# Patient Record
Sex: Female | Born: 1945
Health system: Southern US, Community
[De-identification: ages and names within clinical notes are randomized; demographics above are authoritative.]

## PROBLEM LIST (undated history)

## (undated) DIAGNOSIS — Z5189 Encounter for other specified aftercare: Secondary | ICD-10-CM

## (undated) DIAGNOSIS — I10 Essential (primary) hypertension: Secondary | ICD-10-CM

## (undated) DIAGNOSIS — N184 Chronic kidney disease, stage 4 (severe): Secondary | ICD-10-CM

## (undated) DIAGNOSIS — K219 Gastro-esophageal reflux disease without esophagitis: Secondary | ICD-10-CM

## (undated) DIAGNOSIS — M1990 Unspecified osteoarthritis, unspecified site: Secondary | ICD-10-CM

## (undated) DIAGNOSIS — IMO0001 Reserved for inherently not codable concepts without codable children: Secondary | ICD-10-CM

## (undated) DIAGNOSIS — I509 Heart failure, unspecified: Secondary | ICD-10-CM

## (undated) DIAGNOSIS — G4730 Sleep apnea, unspecified: Secondary | ICD-10-CM

## (undated) DIAGNOSIS — R0602 Shortness of breath: Secondary | ICD-10-CM

## (undated) DIAGNOSIS — I2510 Atherosclerotic heart disease of native coronary artery without angina pectoris: Secondary | ICD-10-CM

## (undated) HISTORY — PX: TOTAL KNEE ARTHROPLASTY: SHX125

## (undated) HISTORY — PX: CORONARY ANGIOPLASTY WITH STENT PLACEMENT: SHX49

## (undated) HISTORY — PX: CHOLECYSTECTOMY: SHX55

---

## 2002-09-19 ENCOUNTER — Encounter: Payer: Self-pay | Admitting: Emergency Medicine

## 2002-09-19 ENCOUNTER — Inpatient Hospital Stay (HOSPITAL_COMMUNITY): Admission: AD | Admit: 2002-09-19 | Discharge: 2002-09-25 | Payer: Self-pay | Admitting: Cardiology

## 2002-09-20 ENCOUNTER — Encounter: Payer: Self-pay | Admitting: Cardiology

## 2002-09-24 ENCOUNTER — Encounter: Payer: Self-pay | Admitting: Cardiology

## 2002-12-22 ENCOUNTER — Encounter: Payer: Self-pay | Admitting: Internal Medicine

## 2002-12-22 ENCOUNTER — Ambulatory Visit (HOSPITAL_COMMUNITY): Admission: RE | Admit: 2002-12-22 | Discharge: 2002-12-22 | Payer: Self-pay | Admitting: Internal Medicine

## 2003-02-18 ENCOUNTER — Emergency Department (HOSPITAL_COMMUNITY): Admission: EM | Admit: 2003-02-18 | Discharge: 2003-02-18 | Payer: Self-pay | Admitting: Emergency Medicine

## 2003-02-18 ENCOUNTER — Encounter: Payer: Self-pay | Admitting: Emergency Medicine

## 2003-03-05 ENCOUNTER — Encounter: Payer: Self-pay | Admitting: Orthopedic Surgery

## 2003-03-05 ENCOUNTER — Ambulatory Visit (HOSPITAL_COMMUNITY): Admission: RE | Admit: 2003-03-05 | Discharge: 2003-03-05 | Payer: Self-pay | Admitting: Orthopedic Surgery

## 2003-08-05 ENCOUNTER — Ambulatory Visit (HOSPITAL_COMMUNITY): Admission: RE | Admit: 2003-08-05 | Discharge: 2003-08-05 | Payer: Self-pay | Admitting: Family Medicine

## 2003-11-17 ENCOUNTER — Emergency Department (HOSPITAL_COMMUNITY): Admission: EM | Admit: 2003-11-17 | Discharge: 2003-11-17 | Payer: Self-pay | Admitting: Emergency Medicine

## 2004-04-28 ENCOUNTER — Ambulatory Visit (HOSPITAL_COMMUNITY): Admission: RE | Admit: 2004-04-28 | Discharge: 2004-04-28 | Payer: Self-pay | Admitting: Family Medicine

## 2004-07-28 ENCOUNTER — Inpatient Hospital Stay (HOSPITAL_COMMUNITY): Admission: EM | Admit: 2004-07-28 | Discharge: 2004-07-29 | Payer: Self-pay | Admitting: Emergency Medicine

## 2004-07-28 ENCOUNTER — Encounter (INDEPENDENT_AMBULATORY_CARE_PROVIDER_SITE_OTHER): Payer: Self-pay | Admitting: *Deleted

## 2004-08-20 ENCOUNTER — Ambulatory Visit (HOSPITAL_COMMUNITY): Admission: RE | Admit: 2004-08-20 | Discharge: 2004-08-20 | Payer: Self-pay | Admitting: Family Medicine

## 2004-08-28 ENCOUNTER — Encounter: Payer: Self-pay | Admitting: Cardiology

## 2004-08-28 ENCOUNTER — Ambulatory Visit (HOSPITAL_COMMUNITY): Admission: RE | Admit: 2004-08-28 | Discharge: 2004-08-28 | Payer: Self-pay | Admitting: Internal Medicine

## 2004-09-08 ENCOUNTER — Ambulatory Visit: Payer: Self-pay | Admitting: Nurse Practitioner

## 2004-09-18 ENCOUNTER — Ambulatory Visit: Payer: Self-pay | Admitting: *Deleted

## 2004-11-27 ENCOUNTER — Ambulatory Visit: Payer: Self-pay | Admitting: Cardiology

## 2005-01-05 ENCOUNTER — Emergency Department (HOSPITAL_COMMUNITY): Admission: EM | Admit: 2005-01-05 | Discharge: 2005-01-05 | Payer: Self-pay | Admitting: *Deleted

## 2005-01-05 ENCOUNTER — Ambulatory Visit: Payer: Self-pay | Admitting: Cardiology

## 2005-01-08 ENCOUNTER — Ambulatory Visit: Payer: Self-pay | Admitting: Family Medicine

## 2005-01-11 ENCOUNTER — Ambulatory Visit (HOSPITAL_COMMUNITY): Admission: RE | Admit: 2005-01-11 | Discharge: 2005-01-11 | Payer: Self-pay | Admitting: Internal Medicine

## 2005-01-12 ENCOUNTER — Ambulatory Visit (HOSPITAL_COMMUNITY): Admission: RE | Admit: 2005-01-12 | Discharge: 2005-01-12 | Payer: Self-pay | Admitting: Internal Medicine

## 2005-01-12 ENCOUNTER — Ambulatory Visit: Payer: Self-pay | Admitting: Internal Medicine

## 2005-01-13 ENCOUNTER — Ambulatory Visit: Payer: Self-pay

## 2005-01-15 ENCOUNTER — Emergency Department (HOSPITAL_COMMUNITY): Admission: EM | Admit: 2005-01-15 | Discharge: 2005-01-15 | Payer: Self-pay | Admitting: Emergency Medicine

## 2005-01-18 ENCOUNTER — Ambulatory Visit: Payer: Self-pay

## 2005-01-25 ENCOUNTER — Ambulatory Visit (HOSPITAL_COMMUNITY): Admission: RE | Admit: 2005-01-25 | Discharge: 2005-01-25 | Payer: Self-pay | Admitting: Internal Medicine

## 2005-02-15 ENCOUNTER — Ambulatory Visit: Payer: Self-pay | Admitting: Internal Medicine

## 2005-02-19 ENCOUNTER — Ambulatory Visit: Payer: Self-pay | Admitting: Nurse Practitioner

## 2005-03-04 ENCOUNTER — Encounter (INDEPENDENT_AMBULATORY_CARE_PROVIDER_SITE_OTHER): Payer: Self-pay | Admitting: *Deleted

## 2005-03-04 ENCOUNTER — Observation Stay (HOSPITAL_COMMUNITY): Admission: RE | Admit: 2005-03-04 | Discharge: 2005-03-05 | Payer: Self-pay | Admitting: General Surgery

## 2005-03-13 ENCOUNTER — Emergency Department (HOSPITAL_COMMUNITY): Admission: EM | Admit: 2005-03-13 | Discharge: 2005-03-13 | Payer: Self-pay | Admitting: Emergency Medicine

## 2005-04-05 ENCOUNTER — Ambulatory Visit: Payer: Self-pay | Admitting: Cardiology

## 2005-09-16 ENCOUNTER — Ambulatory Visit (HOSPITAL_COMMUNITY): Admission: RE | Admit: 2005-09-16 | Discharge: 2005-09-16 | Payer: Self-pay | Admitting: Family Medicine

## 2005-09-23 ENCOUNTER — Ambulatory Visit: Payer: Self-pay | Admitting: Nurse Practitioner

## 2006-03-23 ENCOUNTER — Ambulatory Visit: Payer: Self-pay | Admitting: Nurse Practitioner

## 2006-04-14 ENCOUNTER — Ambulatory Visit: Payer: Self-pay | Admitting: Cardiology

## 2006-04-26 ENCOUNTER — Ambulatory Visit: Payer: Self-pay

## 2006-05-24 ENCOUNTER — Ambulatory Visit: Payer: Self-pay | Admitting: Cardiology

## 2006-05-24 ENCOUNTER — Inpatient Hospital Stay (HOSPITAL_COMMUNITY): Admission: RE | Admit: 2006-05-24 | Discharge: 2006-05-31 | Payer: Self-pay | Admitting: Orthopaedic Surgery

## 2006-05-25 ENCOUNTER — Ambulatory Visit: Payer: Self-pay | Admitting: Physical Medicine & Rehabilitation

## 2006-07-28 ENCOUNTER — Encounter: Admission: RE | Admit: 2006-07-28 | Discharge: 2006-08-22 | Payer: Self-pay | Admitting: Orthopaedic Surgery

## 2007-02-23 ENCOUNTER — Ambulatory Visit: Payer: Self-pay | Admitting: Nurse Practitioner

## 2007-03-08 ENCOUNTER — Ambulatory Visit (HOSPITAL_COMMUNITY): Admission: RE | Admit: 2007-03-08 | Discharge: 2007-03-08 | Payer: Self-pay | Admitting: Family Medicine

## 2007-06-20 ENCOUNTER — Ambulatory Visit: Payer: Self-pay | Admitting: Cardiology

## 2007-07-06 ENCOUNTER — Ambulatory Visit: Payer: Self-pay | Admitting: Family Medicine

## 2007-07-24 ENCOUNTER — Encounter (INDEPENDENT_AMBULATORY_CARE_PROVIDER_SITE_OTHER): Payer: Self-pay | Admitting: Nurse Practitioner

## 2007-07-24 ENCOUNTER — Ambulatory Visit: Payer: Self-pay | Admitting: Internal Medicine

## 2007-07-24 LAB — CONVERTED CEMR LAB
HDL: 50 mg/dL (ref >39)
Total CHOL/HDL Ratio: 3.9
VLDL: 14 mg/dL (ref 0–40)

## 2007-10-01 ENCOUNTER — Emergency Department (HOSPITAL_COMMUNITY): Admission: EM | Admit: 2007-10-01 | Discharge: 2007-10-01 | Payer: Self-pay | Admitting: Emergency Medicine

## 2007-10-04 ENCOUNTER — Emergency Department (HOSPITAL_COMMUNITY): Admission: EM | Admit: 2007-10-04 | Discharge: 2007-10-05 | Payer: Self-pay | Admitting: Family Medicine

## 2007-10-09 ENCOUNTER — Ambulatory Visit: Payer: Self-pay | Admitting: Nurse Practitioner

## 2007-10-09 LAB — CONVERTED CEMR LAB
ALT: 18 units/L (ref 0–35)
Alkaline Phosphatase: 88 units/L (ref 39–117)
Bilirubin, Direct: 0.1 mg/dL (ref 0.0–0.3)
Cholesterol: 171 mg/dL (ref 0–200)
Indirect Bilirubin: 0.4 mg/dL (ref 0.0–0.9)
LDL Cholesterol: 106 mg/dL — ABNORMAL HIGH (ref 0–99)
Triglycerides: 69 mg/dL (ref <150)
VLDL: 14 mg/dL (ref 0–40)

## 2007-12-01 ENCOUNTER — Ambulatory Visit: Payer: Self-pay | Admitting: Family Medicine

## 2008-01-04 ENCOUNTER — Encounter (INDEPENDENT_AMBULATORY_CARE_PROVIDER_SITE_OTHER): Payer: Self-pay | Admitting: Nurse Practitioner

## 2008-01-04 ENCOUNTER — Ambulatory Visit: Payer: Self-pay | Admitting: Internal Medicine

## 2008-01-04 LAB — CONVERTED CEMR LAB
AST: 15 units/L (ref 0–37)
Alkaline Phosphatase: 84 units/L (ref 39–117)
BUN: 21 mg/dL (ref 6–23)
Basophils Absolute: 0 10*3/uL (ref 0.0–0.1)
Basophils Relative: 0 % (ref 0–1)
Creatinine, Ser: 1.19 mg/dL (ref 0.40–1.20)
Eosinophils Absolute: 0.2 10*3/uL (ref 0.0–0.7)
HDL: 57 mg/dL (ref >39)
Hemoglobin: 13.8 g/dL (ref 12.0–15.0)
LDL Cholesterol: 103 mg/dL — ABNORMAL HIGH (ref 0–99)
MCHC: 31.9 g/dL (ref 30.0–36.0)
MCV: 88.9 fL (ref 78.0–100.0)
Monocytes Absolute: 0.5 10*3/uL (ref 0.1–1.0)
Monocytes Relative: 5 % (ref 3–12)
RBC: 4.86 M/uL (ref 3.87–5.11)
RDW: 15.7 % — ABNORMAL HIGH (ref 11.5–15.5)
TSH: 1.927 microintl units/mL (ref 0.350–5.50)
Total Bilirubin: 0.4 mg/dL (ref 0.3–1.2)
Total CHOL/HDL Ratio: 3
VLDL: 13 mg/dL (ref 0–40)

## 2008-01-17 ENCOUNTER — Ambulatory Visit: Payer: Self-pay | Admitting: Cardiology

## 2008-07-08 ENCOUNTER — Ambulatory Visit: Payer: Self-pay | Admitting: Family Medicine

## 2008-07-08 ENCOUNTER — Encounter (INDEPENDENT_AMBULATORY_CARE_PROVIDER_SITE_OTHER): Payer: Self-pay | Admitting: Family Medicine

## 2008-07-08 LAB — CONVERTED CEMR LAB
ALT: 20 units/L (ref 0–35)
AST: 18 units/L (ref 0–37)
Alkaline Phosphatase: 88 units/L (ref 39–117)
CO2: 25 meq/L (ref 19–32)
Cholesterol: 190 mg/dL (ref 0–200)
Creatinine, Ser: 1.23 mg/dL — ABNORMAL HIGH (ref 0.40–1.20)
Total Bilirubin: 0.5 mg/dL (ref 0.3–1.2)
Total CHOL/HDL Ratio: 3.5
VLDL: 14 mg/dL (ref 0–40)

## 2008-09-11 ENCOUNTER — Ambulatory Visit: Payer: Self-pay | Admitting: Cardiology

## 2008-09-17 DIAGNOSIS — I2510 Atherosclerotic heart disease of native coronary artery without angina pectoris: Secondary | ICD-10-CM | POA: Insufficient documentation

## 2008-09-17 DIAGNOSIS — Z9089 Acquired absence of other organs: Secondary | ICD-10-CM

## 2008-09-17 DIAGNOSIS — E785 Hyperlipidemia, unspecified: Secondary | ICD-10-CM

## 2008-09-17 DIAGNOSIS — E669 Obesity, unspecified: Secondary | ICD-10-CM

## 2008-09-17 DIAGNOSIS — I10 Essential (primary) hypertension: Secondary | ICD-10-CM | POA: Insufficient documentation

## 2008-09-27 ENCOUNTER — Inpatient Hospital Stay (HOSPITAL_COMMUNITY): Admission: RE | Admit: 2008-09-27 | Discharge: 2008-10-01 | Payer: Self-pay | Admitting: Orthopaedic Surgery

## 2008-10-07 ENCOUNTER — Emergency Department (HOSPITAL_COMMUNITY): Admission: EM | Admit: 2008-10-07 | Discharge: 2008-10-07 | Payer: Self-pay | Admitting: Emergency Medicine

## 2008-10-09 ENCOUNTER — Ambulatory Visit: Payer: Self-pay | Admitting: Family Medicine

## 2008-10-10 ENCOUNTER — Emergency Department (HOSPITAL_COMMUNITY): Admission: EM | Admit: 2008-10-10 | Discharge: 2008-10-10 | Payer: Self-pay | Admitting: Emergency Medicine

## 2008-10-11 ENCOUNTER — Other Ambulatory Visit: Admission: RE | Admit: 2008-10-11 | Discharge: 2008-10-11 | Payer: Self-pay | Admitting: Obstetrics & Gynecology

## 2008-10-11 ENCOUNTER — Ambulatory Visit: Payer: Self-pay | Admitting: Obstetrics & Gynecology

## 2008-10-30 ENCOUNTER — Ambulatory Visit: Payer: Self-pay | Admitting: Obstetrics and Gynecology

## 2008-10-31 ENCOUNTER — Encounter: Admission: RE | Admit: 2008-10-31 | Discharge: 2008-12-10 | Payer: Self-pay | Admitting: Orthopaedic Surgery

## 2008-10-31 ENCOUNTER — Ambulatory Visit: Payer: Self-pay | Admitting: Internal Medicine

## 2008-10-31 ENCOUNTER — Encounter (INDEPENDENT_AMBULATORY_CARE_PROVIDER_SITE_OTHER): Payer: Self-pay | Admitting: Family Medicine

## 2008-10-31 LAB — CONVERTED CEMR LAB
ALT: 18 units/L (ref 0–35)
AST: 16 units/L (ref 0–37)
Albumin: 4.2 g/dL (ref 3.5–5.2)
BUN: 34 mg/dL — ABNORMAL HIGH (ref 6–23)
Calcium: 9.8 mg/dL (ref 8.4–10.5)
Cholesterol: 150 mg/dL (ref 0–200)
Creatinine, Ser: 1.38 mg/dL — ABNORMAL HIGH (ref 0.40–1.20)
Glucose, Bld: 106 mg/dL — ABNORMAL HIGH (ref 70–99)
Sodium: 139 meq/L (ref 135–145)
Total CHOL/HDL Ratio: 2.9

## 2008-12-17 ENCOUNTER — Encounter: Admission: RE | Admit: 2008-12-17 | Discharge: 2008-12-19 | Payer: Self-pay | Admitting: Orthopaedic Surgery

## 2009-03-05 ENCOUNTER — Encounter (INDEPENDENT_AMBULATORY_CARE_PROVIDER_SITE_OTHER): Payer: Self-pay | Admitting: Internal Medicine

## 2009-03-05 ENCOUNTER — Ambulatory Visit: Payer: Self-pay | Admitting: Internal Medicine

## 2009-03-05 LAB — CONVERTED CEMR LAB
Albumin: 4.2 g/dL (ref 3.5–5.2)
BUN: 28 mg/dL — ABNORMAL HIGH (ref 6–23)
CO2: 21 meq/L (ref 19–32)
Calcium: 9.6 mg/dL (ref 8.4–10.5)
Chloride: 105 meq/L (ref 96–112)
Cholesterol: 176 mg/dL (ref 0–200)
Glucose, Bld: 83 mg/dL (ref 70–99)
HDL: 48 mg/dL (ref >39)
LDL Cholesterol: 114 mg/dL — ABNORMAL HIGH (ref 0–99)
Potassium: 4.1 meq/L (ref 3.5–5.3)
Sodium: 141 meq/L (ref 135–145)
Total Protein: 7.7 g/dL (ref 6.0–8.3)
Triglycerides: 70 mg/dL (ref <150)

## 2009-07-04 ENCOUNTER — Encounter (INDEPENDENT_AMBULATORY_CARE_PROVIDER_SITE_OTHER): Payer: Self-pay | Admitting: Internal Medicine

## 2009-07-04 ENCOUNTER — Ambulatory Visit: Payer: Self-pay | Admitting: Internal Medicine

## 2009-07-04 LAB — CONVERTED CEMR LAB
AST: 15 units/L (ref 0–37)
Albumin: 3.9 g/dL (ref 3.5–5.2)
BUN: 27 mg/dL — ABNORMAL HIGH (ref 6–23)
CO2: 22 meq/L (ref 19–32)
Calcium: 9.4 mg/dL (ref 8.4–10.5)
Chloride: 109 meq/L (ref 96–112)
Creatinine, Ser: 1.25 mg/dL — ABNORMAL HIGH (ref 0.40–1.20)
Glucose, Bld: 74 mg/dL (ref 70–99)
Potassium: 4.5 meq/L (ref 3.5–5.3)

## 2009-07-08 ENCOUNTER — Encounter (INDEPENDENT_AMBULATORY_CARE_PROVIDER_SITE_OTHER): Payer: Self-pay | Admitting: *Deleted

## 2009-07-11 ENCOUNTER — Ambulatory Visit: Payer: Self-pay | Admitting: Internal Medicine

## 2009-07-11 ENCOUNTER — Encounter (INDEPENDENT_AMBULATORY_CARE_PROVIDER_SITE_OTHER): Payer: Self-pay | Admitting: Internal Medicine

## 2009-07-11 LAB — CONVERTED CEMR LAB
Cholesterol: 155 mg/dL (ref 0–200)
HDL: 57 mg/dL (ref >39)

## 2009-07-31 ENCOUNTER — Ambulatory Visit: Payer: Self-pay | Admitting: Internal Medicine

## 2009-07-31 ENCOUNTER — Encounter (INDEPENDENT_AMBULATORY_CARE_PROVIDER_SITE_OTHER): Payer: Self-pay | Admitting: Internal Medicine

## 2009-07-31 LAB — CONVERTED CEMR LAB
Albumin: 4.1 g/dL (ref 3.5–5.2)
BUN: 27 mg/dL — ABNORMAL HIGH (ref 6–23)
CO2: 20 meq/L (ref 19–32)
Glucose, Bld: 87 mg/dL (ref 70–99)
Sodium: 144 meq/L (ref 135–145)
Total Bilirubin: 0.5 mg/dL (ref 0.3–1.2)
Total Protein: 7.8 g/dL (ref 6.0–8.3)

## 2009-09-15 ENCOUNTER — Encounter (INDEPENDENT_AMBULATORY_CARE_PROVIDER_SITE_OTHER): Payer: Self-pay | Admitting: Internal Medicine

## 2009-09-15 ENCOUNTER — Ambulatory Visit: Payer: Self-pay | Admitting: Internal Medicine

## 2009-09-15 LAB — CONVERTED CEMR LAB
BUN: 25 mg/dL — ABNORMAL HIGH (ref 6–23)
Calcium: 9.7 mg/dL (ref 8.4–10.5)
Glucose, Bld: 82 mg/dL (ref 70–99)

## 2009-09-29 ENCOUNTER — Ambulatory Visit: Payer: Self-pay | Admitting: Internal Medicine

## 2009-10-13 ENCOUNTER — Ambulatory Visit: Payer: Self-pay | Admitting: Cardiology

## 2009-10-15 ENCOUNTER — Ambulatory Visit: Payer: Self-pay | Admitting: Internal Medicine

## 2010-02-25 ENCOUNTER — Ambulatory Visit: Payer: Self-pay | Admitting: Internal Medicine

## 2010-04-02 ENCOUNTER — Ambulatory Visit: Payer: Self-pay | Admitting: Internal Medicine

## 2010-04-02 LAB — CONVERTED CEMR LAB
ALT: 15 units/L (ref 0–35)
Alkaline Phosphatase: 81 units/L (ref 39–117)
Creatinine, Ser: 1.44 mg/dL — ABNORMAL HIGH (ref 0.40–1.20)
LDL Cholesterol: 97 mg/dL (ref 0–99)
Sodium: 142 meq/L (ref 135–145)
Total Bilirubin: 0.4 mg/dL (ref 0.3–1.2)
Total CHOL/HDL Ratio: 3
Total Protein: 7.3 g/dL (ref 6.0–8.3)
Triglycerides: 51 mg/dL (ref <150)
VLDL: 10 mg/dL (ref 0–40)

## 2010-04-10 ENCOUNTER — Ambulatory Visit: Payer: Self-pay | Admitting: Internal Medicine

## 2010-06-17 ENCOUNTER — Ambulatory Visit: Payer: Self-pay | Admitting: Internal Medicine

## 2010-06-17 LAB — CONVERTED CEMR LAB
BUN: 34 mg/dL — ABNORMAL HIGH (ref 6–23)
Calcium: 9.3 mg/dL (ref 8.4–10.5)
Hgb A1c MFr Bld: 6 % — ABNORMAL HIGH (ref <5.7)
Microalb, Ur: 36.1 mg/dL — ABNORMAL HIGH (ref 0.00–1.89)
Potassium: 3.9 meq/L (ref 3.5–5.3)
Sodium: 140 meq/L (ref 135–145)

## 2010-06-25 ENCOUNTER — Ambulatory Visit: Payer: Self-pay | Admitting: Internal Medicine

## 2010-09-28 ENCOUNTER — Encounter (INDEPENDENT_AMBULATORY_CARE_PROVIDER_SITE_OTHER): Payer: Self-pay | Admitting: *Deleted

## 2010-09-28 LAB — CONVERTED CEMR LAB
BUN: 30 mg/dL — ABNORMAL HIGH (ref 6–23)
Chloride: 106 meq/L (ref 96–112)
Cholesterol: 171 mg/dL (ref 0–200)
Creatinine, Ser: 1.38 mg/dL — ABNORMAL HIGH (ref 0.40–1.20)
HDL: 60 mg/dL (ref >39)
LDL Cholesterol: 100 mg/dL — ABNORMAL HIGH (ref 0–99)
Triglycerides: 53 mg/dL (ref <150)

## 2010-10-09 ENCOUNTER — Encounter: Payer: Self-pay | Admitting: Cardiology

## 2010-10-09 ENCOUNTER — Emergency Department (HOSPITAL_COMMUNITY): Admission: EM | Admit: 2010-10-09 | Discharge: 2010-10-10 | Payer: Self-pay | Admitting: Emergency Medicine

## 2010-10-13 ENCOUNTER — Ambulatory Visit: Payer: Self-pay | Admitting: Cardiology

## 2011-01-12 NOTE — Assessment & Plan Note (Signed)
Summary: PER CHECK OUT/SF   Visit Type:  Follow-up Primary Provider:  Dr. Redmond Pulling  CC:  CAD.  History of Present Illness: The patient presents for one-year followup. Coincidentally she was in the emergency room a few days ago with abdominal pain. I reviewed this. She was found to have a large ventral hernia without entrapment bowel. However, she is to be referred to a surgeon to discuss possible treatment as it was felt that this could be related to her continued abdominal discomfort. From a cardiovascular standpoint she has had no new symptoms. She does do some walking but doesn't exercise. She's had knee surgeries which improved her ambulation but I think she still limited by morbid obesity and some joint discomfort. She has a somewhat low functional level consequently. With this level of activity she does not describe chest pressure, neck or arm discomfort. She does not have palpitations, presyncope or syncope. She does not have resting complaints such as PND or orthopnea. She unfortunately continues to smoke cigarettes.  Current Medications (verified): 1)  Aspirin 81 Mg Tabs (Aspirin) .... Take One Tablet Qd 2)  Famotidine 20 Mg Tabs (Famotidine) .... Take One Tablet Two Times A Day 3)  Metoprolol Tartrate 50 Mg Tabs (Metoprolol Tartrate) .... Take 1/2 Tablet  Two Times A Day 4)  Glipizide 5 Mg Tabs (Glipizide) .... Once Daily 5)  Simvastatin 20 Mg Tabs (Simvastatin) .... Once Daily 6)  Lisinopril 20 Mg Tabs (Lisinopril) .Marland Kitchen.. 1 By Mouth Daily 7)  Amlodipine Besylate 2.5 Mg Tabs (Amlodipine Besylate) .... Take One Tablet By Mouth Daily 8)  Chlorthalidone 25 Mg Tabs (Chlorthalidone) .... 1/2 By Mouth Daily 9)  Salsalate 500 Mg Tabs (Salsalate) .Marland Kitchen.. 1 By Mouth Daily 10)  Oxycodone-Acetaminophen 5-325 Mg Tabs (Oxycodone-Acetaminophen) .... As Needed  Allergies (verified): No Known Drug Allergies  Past History:  Past Medical History: Reviewed history from 09/17/2008 and no changes  required. Anemia Arthritis CAD with Cypher stenting to RCA (Sep 20, 2002) G I Bleed G E R D Hyperlipidemia Hypertension Syncope (Vasovagal) Obesity  Past Surgical History: Reviewed history from 10/13/2009 and no changes required. Cholecystectomy Right total knee replacement  Review of Systems       As stated in the HPI and negative for all other systems.   Vital Signs:  Patient profile:   65 year old female Height:      64 inches Weight:      264 pounds BMI:     45.48 Pulse rate:   54 / minute Resp:     18 per minute BP sitting:   187 / 73  (right arm)  Vitals Entered By: Levora Angel, CNA (October 13, 2010 11:31 AM)  Physical Exam  General:  Well developed, well nourished, in no acute distress. Head:  normocephalic and atraumatic Eyes:  PERRLA/EOM intact; conjunctiva and lids normal. Neck:  Neck supple, no JVD. No masses, thyromegaly or abnormal cervical nodes. Chest Wall:  no deformities Lungs:  Clear bilaterally to auscultation and percussion. Abdomen:  Bowel sounds positive; abdomen soft and non-tender without masses, organomegaly, or hernias noted. No hepatosplenomegaly. Msk:  Back normal, normal gait. Muscle strength and tone normal. Extremities:  No clubbing or cyanosis. Neurologic:  Alert and oriented x 3. Skin:  Intact without lesions or rashes. Cervical Nodes:  no significant adenopathy Inguinal Nodes:  no significant adenopathy Psych:  Normal affect.   Detailed Cardiovascular Exam  Neck    Carotids: Carotids full and equal bilaterally without bruits.  Neck Veins: Normal, no JVD.    Heart    Inspection: no deformities or lifts noted.      Palpation: normal PMI with no thrills palpable.      Auscultation: regular rate and rhythm, S1, S2 without murmurs, rubs, gallops, or clicks.    Vascular    Abdominal Aorta: no palpable masses, pulsations, or audible bruits.      Femoral Pulses: normal femoral pulses bilaterally.      Pedal Pulses:  normal pedal pulses bilaterally.      Radial Pulses: normal radial pulses bilaterally.      Peripheral Circulation: no clubbing, cyanosis, or edema noted with normal capillary refill.     EKG  Procedure date:  11/09/2010  Findings:      Sinus bradycardia, rate 52, axis within normal limits, intervals within normal limits, poor anterior R-wave progression  Impression & Recommendations:  Problem # 1:  C A D (ICD-414.00) The patient has no new symptoms. However, she has a low functional level. She is being considered apparently for abdominal surgery. I would perform a stress perfusion study prior to this surgery if she is definitely going to have this done. She will consult with her primary physician, get a surgical referral and let me know of her plans. Meanwhile she needs risk reduction.  Problem # 2:  HYPERTENSION, UNSPECIFIED (ICD-401.9) Her blood pressure was elevated today. However, I reviewed the emergency room records and previous readings at other offices and this is an aberration. I discussed weight loss and gave her instructions on keeping a blood pressure diary. We discussed a target blood pressure and she will let me know if she exceeds this.  Problem # 3:  TOBACCO ABUSE (ICD-305.1) We spent a long time talking about the need to stop smoking (greater than 3 minutes). She has not wanted pharmacologic treatment.  Problem # 4:  OBESITY (ICD-278.00) We discussed at length diet strategies and she has changed her eating habits.  Problem # 5:  HYPERLIPIDEMIA-MIXED (P102836.4) Last month her LDL was 100 with an HDL of 60. This is an excellent ratio. No change in therapy is indicated.  Patient Instructions: 1)  Your physician recommends that you schedule a follow-up appointment in 12 months with Dr Percival Spanish 2)  Your physician recommends that you continue on your current medications as directed. Please refer to the Current Medication list given to you today. 3)  You will need a stress  test before having any surgery

## 2011-02-24 LAB — URINE MICROSCOPIC-ADD ON

## 2011-02-24 LAB — DIFFERENTIAL
Basophils Absolute: 0.1 10*3/uL (ref 0.0–0.1)
Basophils Relative: 1 % (ref 0–1)
Lymphocytes Relative: 33 % (ref 12–46)
Neutro Abs: 5.9 10*3/uL (ref 1.7–7.7)
Neutrophils Relative %: 59 % (ref 43–77)

## 2011-02-24 LAB — URINALYSIS, ROUTINE W REFLEX MICROSCOPIC
Nitrite: NEGATIVE
Protein, ur: 100 mg/dL — AB
Specific Gravity, Urine: 1.017 (ref 1.005–1.030)
Urobilinogen, UA: 1 mg/dL (ref 0.0–1.0)

## 2011-02-24 LAB — CBC
HCT: 41.4 % (ref 36.0–46.0)
Hemoglobin: 13.9 g/dL (ref 12.0–15.0)
MCH: 29.3 pg (ref 26.0–34.0)
MCHC: 33.6 g/dL (ref 30.0–36.0)
MCV: 87.3 fL (ref 78.0–100.0)
RDW: 15.2 % (ref 11.5–15.5)

## 2011-02-24 LAB — COMPREHENSIVE METABOLIC PANEL
ALT: 20 U/L (ref 0–35)
BUN: 36 mg/dL — ABNORMAL HIGH (ref 6–23)
CO2: 25 mEq/L (ref 19–32)
Calcium: 9.4 mg/dL (ref 8.4–10.5)
Creatinine, Ser: 1.51 mg/dL — ABNORMAL HIGH (ref 0.4–1.2)
GFR calc non Af Amer: 34 mL/min — ABNORMAL LOW (ref >60)
Glucose, Bld: 62 mg/dL — ABNORMAL LOW (ref 70–99)
Sodium: 139 mEq/L (ref 135–145)
Total Protein: 8.2 g/dL (ref 6.0–8.3)

## 2011-02-24 LAB — LIPASE, BLOOD: Lipase: 37 U/L (ref 11–59)

## 2011-02-24 LAB — POCT CARDIAC MARKERS
CKMB, poc: <1 ng/mL — ABNORMAL LOW (ref 1.0–8.0)
Troponin i, poc: <0.05 ng/mL (ref 0.00–0.09)

## 2011-02-24 LAB — URINE CULTURE: Culture: NO GROWTH

## 2011-04-27 NOTE — Discharge Summary (Signed)
[name], [name] NO.:  [id number]   MEDICAL RECORD NO.:  [id number]          PATIENT TYPE:  INP   LOCATION:  5004                         FACILITY:  [facility]   PHYSICIAN:  Lind Guest. Ninfa Linden, M.D.DATE OF BIRTH:  July 15, 1946   DATE OF ADMISSION:  09/27/2008  DATE OF DISCHARGE:  10/01/2008                               DISCHARGE SUMMARY   ADMITTING DIAGNOSES:  Severe degenerative joint disease and  osteoarthritis, right knee.   DISCHARGE DIAGNOSES:  Severe degenerative joint disease and  osteoarthritis, right knee.   PROCEDURES:  Right total knee arthroplasty on September 27, 2008.   HOSPITAL COURSE:  Briefly, [name] is a 65 year old female who has  known degenerative joint disease and osteoarthritis of her right knee  that is affecting her activities of daily living.  She had undergone a  previous left total knee replacement successfully and now wishes to  proceed with a right total knee replacement.  The risks and benefits of  this were explained to her and well understood and she agreed to proceed  with the surgery.  She was taken to the operating room on the day of  admission and a right total knee arthroplasty was placed.  It was  performed without complications.  For detailed description of the  operation, please refer to the dictated operative note on the patient's  medical record.  Postoperatively, she was admitted to a regular  orthopedic floor bed and progressed well with an uncomplicated hospital  stay.  She was transitioned on Coumadin for DVT prophylaxis,  transitioned to oral pain medications as well as a regular diet.  By the  day of discharge, she was working with physical therapy and was felt  that she could be discharged safely to home with home health followup.   DISPOSITION:  To home.   DISCHARGE INSTRUCTIONS:  While she is at home, she will continue with  home health therapy, working on range of motion of her knee, balance,  and  coordination.  A followup will be established in the office in 2  weeks.   DISCHARGE MEDICATIONS:  1. Percocet as needed.  2. Robaxin as needed.  3. Coumadin adjusted for a target INR of 2-3.  4. Metoprolol.  5. Zocor.  6. Metformin.  7. Isosorbide.  8. __________  9. Hydrochlorothiazide.     Lind Guest. Ninfa Linden, M.D.  Electronically Signed    CYB/MEDQ  D:  10/01/2008  T:  10/01/2008  Job:  [id number]

## 2011-04-27 NOTE — Assessment & Plan Note (Signed)
[facility] OFFICE NOTE   CHAUNTE, ORRICK                     MRN:          [id number]  DATE:01/17/2008                            DOB:          07-09-1946    PRIMARY CARE Gustave Lindeman:  Suzie Portela at [facility].   REASON FOR VISIT:  Cardiology follow-up.   HISTORY OF PRESENT ILLNESS:  Ms. Sylla comes in for a 65-month visit.  She is not reporting any typical anginal chest pain.  She has baseline  dyspnea on exertion and NYHA class II to III.  She tells me that she is  thinking about possibly pursuing bariatric surgery, although she has not  made any formal contact with the surgeon.  She does remain significantly  overweight.  I was pleased to see that she is now on simvastatin.  We  talked about this medicine some today.  She otherwise continues on the  regimen outlined below and blood pressure looks better today.  Her  electrocardiogram is stable showing sinus rhythm with decreased septal R  wave progression which is old.   ALLERGIES:  PENICILLIN.   MEDICATIONS:  Aspirin 81 mg p.o. daily, metoprolol 25 mg p.o. b.i.d.  Imdur 30 mg p.o. daily, hydrochlorothiazide 25 mg p.o. daily, enalapril  5 mg p.o. daily, simvastatin 10 mg p.o. daily.   Review of systems as in present illness.  No palpitations or syncope.  Otherwise negative.   EXAM:  Blood pressure days 130/80 heart rate 65, weight 276 pounds.  Morbidly obese no acute distress.  Examination neck was elevated was pressure loud bruits and no  thyromegaly.  Lungs are clear diminished breath sounds.  CARDIAC:  Exam regular rate and rhythm.  No loud murmur, S3 gallop.  EXTREMITIES:  Exhibit no frank pitting edema.  Does have some venous  stasis.   IMPRESSION/RECOMMENDATIONS:  Coronary artery disease status post drug-  eluting stent placed in the right coronary artery in October 2003.  The  patient had a nonischemic Myoview within the last year.  Is  not  describing any progressive symptoms on medical therapy.  Pleased to see  that she is now on a statin medication.  Would aim for a goal LDL around  70.  She will continue to follow [facility].  We will see back of the  next 6 months.     Satira Sark, MD  Electronically Signed    SGM/MedQ  DD: 01/17/2008  DT: 01/18/2008  Job #: [id number]   cc:   Suzie Portela

## 2011-04-27 NOTE — Op Note (Signed)
NAMESHAKYRAH, SVETLIK NO.:  [id number]   MEDICAL RECORD NO.:  [id number]          PATIENT TYPE:  INP   LOCATION:  5004                         FACILITY:  [facility]   PHYSICIAN:  Lind Guest. Ninfa Linden, M.D.DATE OF BIRTH:  [birth year]   DATE OF PROCEDURE:  09/27/2008  DATE OF DISCHARGE:                               OPERATIVE REPORT   PREOPERATIVE DIAGNOSES:  Severe degenerative joint disease and  osteoarthritis, right knee.   POSTOPERATIVE DIAGNOSES:  Severe degenerative joint disease and  osteoarthritis, right knee.   PROCEDURE:  Right total knee arthroplasty utilizing computer navigation.   IMPLANTS:  DePuy rotating platform knee with size 2.5 femur, size 2.5  tibial tray, 12.5-mm polyethylene insert, 32-mm patella button.   SURGEON:  Lind Guest. Ninfa Linden, MD   ASSISTANT:  Phillips Hay, PA-C   ANESTHESIA:  1. Right leg femoral nerve block.  2. General anesthesia.   ANTIBIOTICS:  900 mg IV clindamycin.   TOURNIQUET TIME:  1 hour and 44 minutes.   ESTIMATED BLOOD LOSS:  300 mL.   COMPLICATIONS:  None.   INDICATIONS:  Briefly, Ms. Blanchfield is a 65 year old obese female with  debilitating arthritis involving both her knees.  Two years ago, I  performed a left total knee arthroplasty.  She is being getting around  well for the last year.  On her right knee, it has gotten to very loose  hardware quite enough.  We tried to temporize this with anti-  inflammatories and injections and she has gotten to the point where this  is greatly affecting her activities of daily living and she wished to  proceed with a total knee replacement.  The risks and benefits of this  were well understood having been through a previous total knee  replacement for.  The major risks for blood loss, DVT, and fatal PE.  She wished to proceed with surgery in spite of this risk.   PROCEDURE:  After informed consent was obtained and a right leg femoral  block was obtained, Ms.  Debiasi was brought to the operating, placed  supine on the operating table.  General anesthesia was obtained, Foley  catheter was placed.  A nonsterile tourniquet was placed around her  upper right thigh.  Her right leg was prepped and draped with DuraPrep  and sterile drapes including a sterile stockinette.  A time-out was  called.  She was identified as the correct patient and correct right  knee.  I then used an Esmarch to wrap the leg and tourniquet was  inflated to 350 mm of pressure.  A midline incision was made directly  over the patella and carried proximally and distally down to the level  of patellar tubercle.  I divided the soft tissue sharply with a knife,  and once the patellar tendon was exposed, I took a medial parapatellar  approach with an arthrotomy to the knee joint.  There was synovitis  noted in the knee as well as effusion, this was drained.  I then cleaned  the knee of debris including osteophytes, the meniscus, and the ACL,  PCL.  Next, we proceeded  with the computer navigation portion of the  case.  Two Steinmann pins were placed from an anteromedial to  posterolateral direction through 2 separate stab incisions in the tibia.  This was outside the main incision.  Within the main incision, I did  place 2 Steinmann pins in anteromedial to posterolateral direction in  the femur.  Navigation globes were then placed in the femur and the  tibia and this allowed [place] to use computer navigation using the [employer]  System to mark out the knee.  Once this was accomplished throughout the  tibia and the femur and the rotation hip was obtained, the computer  helped [place] with selecting a size of the components as well as making  appropriate cuts.  First, we proceeded with a tibial cut.  With the knee  in flexion and using navigation, the tibial cutting guide was placed.  I  then took approximately 10 mm off the high side of the tibia.  This was  verified under computer navigation  as going along with our preoperative  plan and navigated plan.  Once this cut was made, we verified this cut  with flexion and extension blocks.  I then made a distal femoral cut  based on the size 2.5-mm femur.  This was verified in flexion and  extension as well and our gaps were equal.  Next, I did finishing cuts  on the femur with the chamfer cuts as well as the box cut followed by  the tibial cut based off of 2.5-mm tibia.  This included the keel and  the wing.  I then placed a 2.5-mm trial femur as well as a 2.5-mm trial  tibial component and a 12.5-mm trial spacer.  I put the knee through a  range of motion and this was very stable and balanced using computer  navigation.  Next, I took measurements of the patella and cut 40 mm off  the patella and replaced this with a 32-mm patellar button.  All trial  components were then removed and we copiously irrigated the knee with  pulsatile lavage solution.  Once we started mixing the cement then and  then cemented the 2.5-mm DePuy tibial tray, which was a rotating  platform tray, next was the 2.5-mm femur, and we placed a 12.5-mm real  polyethylene insert and cemented the patella button.  Once the cement  had dried with the tourniquet let down at 1 hour and 44 minutes and  hemostasis was maintained, a medium Hemovac was then placed deep in the  wound and the arthrotomy was closed with interrupted #1 Vicryl suture  followed by 0 Vicryl in the deep tissue, 2-0 Vicryl in the subcutaneous  tissue, and then interrupted 2-0 Vicryl subcuticular stitch and Steri-  Strips were applied.  The Steinmann pins were removed and the distal  incisions from the Steinmann pins were closed with 2 simple 3-0 nylon  sutures.  A well-padded sterile dressing was applied, and the patient's  knee was placed in the knee immobilizer.  She was awakened, extubated,  and taken to recovery room in stable condition.  All final counts were  correct, and there were no  complications noted.      Lind Guest. Ninfa Linden, M.D.  Electronically Signed     CYB/MEDQ  D:  09/27/2008  T:  09/27/2008  Job:  [id number]

## 2011-04-27 NOTE — Assessment & Plan Note (Signed)
[facility] OFFICE NOTE   FERN, FLATEN                     MRN:          [id number]  DATE:09/11/2008                            DOB:          Aug 03, 1946    REFERRING PHYSICIAN:  Lind Guest. Ninfa Linden, M.D.   PRIMARY:  [facility].   REASON FOR PRESENTATION:  Preoperative evaluation in a patient with  known coronary artery disease.   HISTORY OF PRESENT ILLNESS:  The patient is a lovely 65 year old African  American female who was seen previously by Dr. Dannielle Burn and Dr. Domenic Polite.  She is going to have a right total knee replacement and is referred for  preoperative evaluation.  She has a history of coronary artery disease  including stenting to the right coronary artery in 2003.  At that time,  she presented with arm pain.  Since then, she has done well from a  cardiovascular standpoint.  She was last evaluated by stress perfusion  study in 2007 prior to left total knee replacement.  This demonstrated  an ejection fraction of 55%.  There was no evidence of ischemia or  infarct.  She did well with this surgery.   Since then, she has gotten along reasonably well though she does not  exercise.  She does do a lot of cleaning in her house.  With this level  of activity (greater than 5 minutes), she does not describe any of the  arm discomfort she had at the time of her stent.  She does not get chest  pressure, neck or arm discomfort.  She does not get short of breath with  this level of activity but will get short of breath with more  significant activity.  She does not have any resting shortness of  breath.  Denies any PND or orthopnea.  She had no palpitation, no  presyncope or syncope.   PAST MEDICAL HISTORY:  Coronary artery disease (status post Cypher  stenting to the right coronary artery in 2003), hypertension, newly  diagnosed diabetes, gastroesophageal reflux disease, dyslipidemia,  arthritis,  morbid obesity, and ongoing tobacco abuse.   PAST SURGICAL HISTORY:  Cholecystectomy, tonsillectomy, and left total  knee replacement.   ALLERGIES:  PENICILLIN.   MEDICATIONS:  1. Aspirin 81 mg daily.  2. Metoprolol 25 mg b.i.d.  3. Isosorbide 30 mg daily.  4. Hydrochlorothiazide 25 mg daily.  5. Enalapril 5 mg daily.  6. Simvastatin 10 mg daily.  7. Metformin 500 mg nightly.   SOCIAL HISTORY:  The patient lives alone.  She has children and  grandchildren.  She is retired [employer].  She is currently smoking less than  a half pack a day.  She smoked cigarettes for 20 years.   FAMILY HISTORY:  Contributory for brother with coronary artery disease  in his 47s.   REVIEW OF SYSTEMS:  As stated in the HPI and negative for all other  systems.   PHYSICAL EXAMINATION:  GENERAL:  The patient is pleasant and in no  distress.  VITAL SIGNS:  Blood pressure 179/99, heart rate 80 and regular, weight  267 pounds,  and body mass index greater than 35.  HEENT:  Eyes are unremarkable.  Pupils are equal, round, and reactive to  light.  Fundi not visualized.  Oral mucosa unremarkable.  NECK:  No jugular venous distention at 45 degrees.  Carotid upstroke  brisk and symmetric, no bruits, no thyromegaly.  LYMPHATICS:  No cervical, axillary, or inguinal lymphadenopathy.  LUNGS:  Clear to auscultation bilaterally.  BACK:  No costovertebral angle tenderness.  CHEST:  Unremarkable.  HEART:  PMI not displaced or sustained, S1 and S2 within normal.  No S3,  no S4, no clicks, no rubs, or no murmurs.  ABDOMEN:  Morbidly obese.  Positive bowel sounds, normal in frequency  and pitch.  No bruits, no rebound, no guarding, or midline pulsatile  mass.  No hepatomegaly, no splenomegaly.  SKIN:  No rashes, no nodules.  EXTREMITIES:  Pulses 2+ throughout.  No edema, no cyanosis, or no  clubbing.  NEURO:  Oriented to person, place, and time.  Cranial nerves II through  XII grossly intact.  Motor grossly intact.    EKG:  Sinus rhythm, rate 68, axis within normal limits, intervals within  normal limits, poor anterior R wave progression, no acute ST-wave  changes.   ASSESSMENT AND PLAN:  1. Preoperative evaluation.  The patient has a moderate functional      level (greater than 5 METS), she is going for a procedure that is      moderate risk from a cardiovascular standpoint.  She has no high      risk clinical features.  She had a negative stress perfusion study      in 2007.  Therefore, according to ACC/AHA guidelines, the patient      is at acceptable risk for the planned surgery.  No further      cardiovascular testing is suggested.  She should continue on the      beta-blocker since she has been on this chronically.  2. Hypertension.  Blood pressure is elevated today but not typically.      I will not make an adjustment based on this 1 reading.  She is to      keep an eye on this and can be followed in the hospital as well.      We may need to make med adjustments.  3. Dyslipidemia.  This was followed by her primary care physician.      The goal should be an LDL less than 70 (now given her diabetes) and      an HDL greater than 50.  I would be happy to review this.  4. Diabetes per her primary care doctor.  5. Morbid obesity.  We discussed this at length.  I suggest the [employer].  6. Tobacco.  We discussed this.  She understands the need to stop      smoking.  She has done      this in the past and would try cold [place].  7. Followup.  I will see the patient in 1 year or sooner if needed.     Minus Breeding, MD, [facility]  Electronically Signed    JH/MedQ  DD: 09/11/2008  DT: 09/12/2008  Job #: [id number]   cc:   Lind Guest. Ninfa Linden, M.D.

## 2011-04-27 NOTE — Group Therapy Note (Signed)
[name], [name] NO.:  [id number]   MEDICAL RECORD NO.:  [id number]          PATIENT TYPE:  WOC   LOCATION:  [place]:  [facility]   PHYSICIAN:  Orland Mustard, MD       DATE OF BIRTH:  June 24, 1946   DATE OF SERVICE:  10/11/2008                                  CLINIC NOTE   REASON FOR VISIT:  Postmenopausal bleeding.   HISTORY OF PRESENT ILLNESS:  Ms. Kathleen [name] is a 65 year old G9, P7-  0-2-5.  She has had 2 children pass away, who comes in today complaining  of vaginal bleeding, starting Tuesday, approximately 3 days ago.  She  has been postmenopausal for approximately 20 years and has never had any  bleeding up until 3 days ago.  She was seen at the [facility] ER for  this problem and was referred here for further workup.  She states she  does have some cramping with the bleeding in that her bleeding is  approximately the medium flow, but no clots.  Of note, she did recently  have knee replacement surgery on September 27, 2008, and was started on  Coumadin on October 01, 2008 for prevention of DVT following the  surgery.  She has been on Coumadin before and has not had any bleeding  problems previously.  Otherwise, she has no complaints today.   PAST MEDICAL HISTORY:  Significant for:  1. Arthritis.  2. Coronary artery disease.  3. Hyperlipidemia.  4. High blood pressure.  5. Diabetes.   SOCIAL HISTORY:  She does not drink.  She is retired, but she does smoke  approximately a third pack a day for the last 30 years.   SURGICAL HISTORY:  She has had a cholecystectomy in 2006.   OB/GYN HISTORY:  The patient is a G9, P7-0-2-5, and she has had 2  children pass away.  Her last Pap smear was in 2007.  She has these  performed at [facility].  Last mammogram was in 2007.  She has  never had an abnormal Pap smear or mammogram.   PHYSICAL EXAMINATION:  VITAL SIGNS:  Temperature today is 97.6, pulse  83, respirations 20,  blood pressure 145/76, weight is 257 pounds, and  height is 5 feet 4 inches tall.  GENERAL:  She is a pleasant, obese female in no acute distress.  HEENT:  Severe hirsutism over her jaw and chin.  GU:  Uterus and adnexa are not palpable due to patient's body habitus.  Exam is nontender.  She has normal external genitalia with no lesions.  Normal introitus.  Vagina is pink and mildly atrophic with no  friability.  Cervix was difficult to visualize, but was seen in the  anterior position with some blood in the os, but no obvious lesion or  friability.   After we obtained informed consent, using tenaculum to stabilize the  cervix, the uterus was sounded to 7 cm, then a speculum was inserted and  endometrial biopsy was taken.  Two passes were done with a Pipelle for 2  samples and sent for Pathology.   ASSESSMENT AND PLAN:  This is a 65 year old G9, P67 female with;  1. Postmenopausal bleeding.  Endometrial biopsy was obtained today,      and the patient was instructed to return in 2 weeks for her      results.  All questions were answered today, and the patient had no      other complaints.           ______________________________  Orland Mustard, MD     LM/MEDQ  D:  10/11/2008  T:  10/11/2008  Job:  [id number]   cc:   Donzetta Sprung, D.O.

## 2011-04-27 NOTE — Assessment & Plan Note (Signed)
[place] OFFICE NOTE   SYNAE, DUCRE                       MRN:          [id number]  DATE:06/20/2007                            DOB:          1946/01/10    FOLLOWUP VISIT:   PRIMARY CARE PHYSICIAN:  Suzie Portela, nurse practitioner, with [facility].   REASON FOR VISIT:  Cardiac followup.   HISTORY OF PRESENT ILLNESS:  I saw Ms. Quiett in May of last year as  part of a preoperative evaluation.  Her history is detailed in a  previous note.  She had a follow-up Myoview at that time which indicated  no evidence of ischemia with an ejection fraction of 55%.  She underwent  a left total knee arthroplasty in June of last year and tolerated this  well from a cardiac perspective.  She continues to deny any problems  with angina.  Today I reviewed her medications.  I note that she has  been out of hydrochlorothiazide.   Her electrocardiogram shows sinus rhythm with decreased R wave  progression, as noted previously.   I spoke with her today about getting a follow-up cholesterol check when  she sees her primary Jany Buckwalter later this month.  She is not on statin  therapies at this time.   ALLERGIES:  PENICILLIN.   CURRENT MEDICATIONS:  1. Aspirin 81 mg p.o. daily.  2. Metoprolol 25 mg p.o. b.i.d.  3. Imdur 30 mg p.o. daily.  4. Hydrochlorothiazide 25 mg p.o. daily.  5. Enalapril 5 mg p.o. daily.   REVIEW OF SYSTEMS:  As described in the history of present illness.   PHYSICAL EXAMINATION:  VITAL SIGNS:  Blood pressure 192/80, heart rate  76.  Weight is 272 pounds.  GENERAL:  Patient is comfortable in no acute distress.  NECK:  No elevated jugular venous pressure.  LUNGS:  Clear without labored breathing at rest.  CARDIAC:  Regular rate and rhythm.  No S3 gallop.  EXTREMITIES:  No significant pitting edema.   IMPRESSION/RECOMMENDATIONS:  1. Coronary artery disease, status post drug-eluting stent  placement      to the right coronary artery in October, 2003.  She had a      nonischemic Myoview in May of last year as part of her preoperative      evaluation and is doing well symptomatically at this time.  I      reviewed her medications and provided a prescription refill for      hydrochlorothiazide.  She may, in fact, need further medicine      adjustments if her blood pressure remains elevated.  I have asked      her to follow up with Ms. Simeon Craft at [facility], as planned this      month, and also at that time have a follow-up lipid profile.      Ideally, she should have an LDL around 70, and I suspect that      statin therapy would be indicated.  Otherwise, I will plan to see      her back in the next  six months.  2. Further plans to follow.     Satira Sark, MD  Electronically Signed    SGM/MedQ  DD: 06/20/2007  DT: 06/20/2007  Job #: [id number]   cc:   Suzie Portela, FNP

## 2011-04-27 NOTE — Group Therapy Note (Signed)
NAMEMYKESHA, MICHELS NO.:  [id number]   MEDICAL RECORD NO.:  [id number]          PATIENT TYPE:  WOC   LOCATION:  [facility]                   FACILITY:  [facility]   PHYSICIAN:  Andrew Au, MD        DATE OF BIRTH:  09-19-1946   DATE OF SERVICE:  10/30/2008                                  CLINIC NOTE   REASON FOR VISIT:  Followup for endometrial biopsy results.   Mrs. Najar is a 65 year old gravida 29, para 7-0-2-5 who had an  endometrial biopsy performed approximately 2 weeks ago after 3 days of  vaginal bleeding.  Of importance is that the patient had a knee  replacement surgery and was on daily Lovenox injections for  approximately 3 weeks after the surgery.  The bleeding occurred during  this time frame and the bleeding stopped within a few days after she  discontinued her Lovenox injections.  Today, she reports she does not  have any bleeding for the last 5 days.  Her biopsy results reveal  fragments of benign endometrial polyp and some fragments of benign  nonpolypoid endometrium with breakdown.   ASSESSMENT:  Vaginal bleeding most likely related to a benign polyp.  Given the relation of the bleeding to the anticoagulant used, and in  addition the fact that she has no further bleeding, I do not recommend  any further workup at this time.  I instructed the patient that if the  bleeding returns or she has had any problems, to call the clinic and we  will do an ultrasound to further evaluate her uterus.  The patient's  questions were answered and she understood.  She voiced understanding of  the instructions.     ______________________________  Donzetta Sprung, D.O.    ______________________________  Andrew Au, MD    MC/MEDQ  D:  10/30/2008  T:  10/31/2008  Job:  [id number]

## 2011-04-30 NOTE — Consult Note (Signed)
NAME:  Kathleen [name], Kathleen [name] NO.:  [id number]   MEDICAL RECORD NO.:  [id number]          PATIENT TYPE:  INP   LOCATION:  5014                         FACILITY:  [facility]   PHYSICIAN:  Kirk Ruths, M.D. [facility] OF BIRTH:  [birth year]   DATE OF CONSULTATION:  05/26/2006  DATE OF DISCHARGE:                                   CONSULTATION   Kathleen [name] is a pleasant 65 year old female with past medical history of  coronary artery disease, status post PCI of her right coronary artery,  hypertension, hyperlipidemia, gastroesophageal reflux disease, who we were  asked to evaluate for chest pain.  Note the patient did have a recent  preoperative evaluation by Kathleen [name].  This included a nuclear study on  Apr 26, 2006 which showed an ejection fraction of 51% and normal perfusion.  She had knee replacement on May 24, 2006.  She complained of chest pain  this morning and we were asked to evaluate.  Note she typically does have  some dyspnea on exertion which is a chronic issue.  There is no orthopnea,  PND, pedal edema, palpitations or recent syncope.  She did not have  exertional chest pain.  She does occasionally have chest pain in the  mornings which is common for her.  It only occurs in the morning and is  described as a pressure.  It begins in the epigastric area and radiates to  her neck.  There is occasional water brash associated with this.  There is  also occasional tingling in the arms associated with it.  It is not like the  pain prior to her having her stent placed.  There is associated shortness of  breath and diaphoresis but there is no nausea or vomiting.  The pain is not  pleuritic or positional.  She was recently placed on Protonix for this pain  and it has improved somewhat.  This morning, she had similar episodes  compared to what she has had previously.  It lasted for approximately 5  minutes and resolved spontaneously.  Cardiology is now asked to further  evaluate.   ALLERGIES:  SHE IS ALLERGIC TO PENICILLIN.   MEDICATIONS AT PRESENT:  1.  Include Lopressor 25 mg p.o. b.i.d.  2.  Hydrochlorothiazide 25 mg p.o. daily is on hold.  3.  Enalapril 5 mg p.o. daily is on hold.  4.  Imdur 30 mg p.o. daily is on hold.  5.  Protonix 80 mg p.o. daily.  6.  Aspirin 81 mg p.o. daily.  7.  Coumadin per pharmacy.  8.  Senokot.   SOCIAL HISTORY:  She does not smoke nor does she consume alcohol.   FAMILY HISTORY:  Positive for coronary artery disease.   PAST MEDICAL HISTORY:  Significant for hypertension and hyperlipidemia but  there is no diabetes mellitus.  She has a history of gastroesophageal reflux  disease.  She has a history of coronary artery disease and had PCI of her  right coronary artery in 2003.  She has had a prior cholecystectomy and  tonsillectomy.   REVIEW OF SYSTEMS:  She denies any  headaches, fevers or chills.  There is no  productive cough or hemoptysis.  There is no dysphagia or odynophagia,  melena or hematochezia.  There is no dysuria or hematuria.  There is no  seizure activity.  There is no orthopnea, PND or pedal edema.  She has had  some pain in her left knee which has caused her to have her knee replaced.   PHYSICAL EXAM:  Today, her blood pressure is 168/75 and her pulse is 91.  She is afebrile.  She is 90% on room air.  She is well-developed and  somewhat obese.  She is in no acute distress.  SKIN:  Warm and dry.  She does not appear to be depressed.  There is no focal abnormality.  HEENT:  Unremarkable with normal eyelids.  NECK:  Supple, normal motion bilaterally, there are no bruits noted.  There  is no jugular venous distension.  I cannot appreciate thyromegaly.  CHEST:  Clear to auscultation, normal expansion.  CARDIOVASCULAR:  Exam reveals a regular rate and rhythm, normal S1 and S2.  There are murmurs, rubs or gallops noted.  ABDOMEN:  Nontender, nondistended, positive bowel sounds, no  hepatosplenomegaly.   No masses appreciated.  There is no abdominal bruit.  She has 2+ femoral pulses bilaterally, no bruits.  EXTREMITIES:  Show no edema.  She is status post left knee replacement.  Her  distal pulses are intact.  NEUROLOGIC:  Exam is grossly intact as well.   Electrocardiogram shows a normal sinus rhythm with nonspecific ST-changes.  The ST-changes are slightly more prominent compared to previous.   LABORATORY:  Show a hemoglobin and hematocrit of 11 and 32.3.  Her potassium  is 4.  Her BUN and creatinine are elevated at 34 and 2.3.   DIAGNOSES:  1.  Chest pain.  2.  History of coronary artery disease.  3.  Acute renal insufficiency, improving by today's laboratories.  4.  Hypertension.  5.  Hyperlipidemia.  6.  Status post knee replacement.   PLAN:  Kathleen [name] is complaining of chest pain that sounds to be  potentially reflux.  She states that it typically occurs in the morning and  can be associated with water brash.  It is also improved with Protonix.  We  will continue with the Protonix and I will also add Maalox as needed.  Certainly with her history of coronary artery disease, we also must consider  ischemia.  She does have nonspecific ST-changes on her electrocardiogram  that are more prominent compared to previous.  We will treat with aspirin  and Lopressor and she is to continue on her Coumadin.  We will plan to cycle  enzymes and repeat electrocardiogram in the morning.  If the enzymes are  negative and her electrocardiogram is unchanged, and also her symptoms  improve, then I would not pursue further cardiac workup as she did have a  recent negative Myoview.  We will be happy to follow while she is in the  hospital.  The etiology of her recent renal insufficiency is unclear though  it is improving by most recent BMET.  She was mildly hypotensive yesterday  and certainly this may have contributed.  Her enalapril and hydrochlorothiazide are on hold and I would continue to  hold these until her  renal function is normalized.           ______________________________  Kirk Ruths, M.D. [facility]     BC/MEDQ  D:  05/26/2006  T:  05/26/2006  Job:  [id number]

## 2011-04-30 NOTE — Cardiovascular Report (Signed)
NAME:  Kathleen [name], Kathleen [name]                        ACCOUNT NO.:  [id number]   MEDICAL RECORD NO.:  [id number]                   PATIENT TYPE:  INP   LOCATION:  2920                                 FACILITY:  [facility]   PHYSICIAN:  Ethelle Lyon, M.D. [facility]         DATE OF BIRTH:  Sep 15, 1946   DATE OF PROCEDURE:  09/20/2002  DATE OF DISCHARGE:                              CARDIAC CATHETERIZATION   PROCEDURE:  Left heart catheterization, left ventriculography, coronary  angiography, stent to the mid right coronary artery.   INDICATIONS FOR PROCEDURE:  The patient is a 65 year old lady with cardiac  risk factors of obesity, markedly positive family history, hypertension, and  tobacco use who presents with unstable angina.  EKG was unrevealing and  enzymes were negative.  She was referred for diagnostic angiography and  possible percutaneous intervention.   DIAGNOSTIC TECHNIQUE:  Under 2% lidocaine local anesthesia, a #6 French  sheath was placed in the right femoral artery using the modified Seldinger  technique.  Angiography was performed using JL4 and AL1 catheters.  The  ostium of the RCA was noted to be high and anterior.  Ventriculography was  performed via a pigtail catheter by power injection.  The case then  proceeded to intervention.   DIAGNOSTIC FINDINGS:  1. Left main:  Angiographically normal.  2. LAD:  Moderate sized vessel without significant diagonal branches.  It is     angiographically normal.  3. Ramus intermedius:  Large vessel which is angiographically normal.  4. Circumflex:  The circumflex is a moderate-sized vessel giving rise to a     single obtuse marginal branch.  It has minimal luminal irregularities.  5. RCA:  The RCA is a moderate sized vessel with a high anterior takeoff.     The mid vessel has an 80% stenosis just after the origin of an acute     marginal.  6. LV analysis precluded by ventricular ectopy.  7. No aortic stenosis on pullback.   PERCUTANEOUS INTERVENTION TECHNIQUE:  Anticoagulation was initiated with  heparin and Integrilin to achieve an ACT of greater than 250 seconds.  An  AL1 guide was advanced over a wire and engaged in the ostium of the right  coronary artery.  A BMW wire was advanced across the lesion and into the  posterior descending artery.  A 2.5 x 18-mm CYPHER was then positioned  across the lesion and inflated to 14 atmospheres.  The stent was then post  dilated using a 3.0 x 15-mm Quantum balloon at 16 atmospheres at each end of  the stent and 20 atmospheres in mid stent yielding a final diameter of 3.1  mm.  Final angiograms demonstrated no residual stenosis, no dissection, and  TIMI-3 flow.   IMPRESSION/PLAN:  The patient has single-vessel coronary disease of her RCA.  This culprit lesion was successfully stented using a drug-eluting stent  resulting in no residual stenosis and TIMI-3 flow.  We will  plan on  Integrilin for 18 hours.  Plavix should be continued for a minimum of 3  months with strong consideration given to 9 months of therapy given her  presentation with an acute coronary syndrome.  Aspirin should be continued  indefinitely.  Sheaths will be removed when her ACT is less than 150  seconds.                                               Ethelle Lyon, M.D. [facility]    WED/MEDQ  D:  09/20/2002  T:  09/23/2002  Job:  [id number]   cc:   Ernestine Mcmurray, M.D. [facility]

## 2011-04-30 NOTE — H&P (Signed)
NAME:  Kathleen [name], Kathleen [name]                        ACCOUNT NO.:  [id number]   MEDICAL RECORD NO.:  [id number]                   PATIENT TYPE:  EMS   LOCATION:  [facility]                                 FACILITY:  [facility]   PHYSICIAN:  Melissa L. Lovena Le, MD               DATE OF BIRTH:  1946/07/08   DATE OF ADMISSION:  07/28/2004  DATE OF DISCHARGE:                                HISTORY & PHYSICAL   CHIEF COMPLAINT:  I fell out.   HISTORY OF PRESENT ILLNESS:  This is a 65 year old African American female  with a past medical history significant for CAD with stents, morbid obesity,  hypertension, who states that she has had 1 week's worth of fever, chills,  cough with sort of grayish-green sputum.  She states that last night she was  then reclining on the couch when she suddenly developed increased sensation  of heat and warmth, and developed some central substernal chest pain with  increased shortness of breath.  The next thing the patient noted is she was  lying, looking up at her family.  The patient's niece stated that she fell  back, but did not have any loss of bowel or bladder function and that she  awoke spontaneously several minutes later.  The patient reports having  intermittent chest pain on a semi-regular basis, usually relieved by sitting  down.  She does have nitroglycerin at home, but has not used that in some  time.  She does state at the time of the event she was coughing profusely.   REVIEW OF SYSTEMS:  Review of systems as above, positive cough with gray-  green sputum, sore throat, fever, chills x1 week.  No diarrhea or  constipation.  Positive nasal congestion with green mucus and she has had  decreased appetite secondary to recently having her teeth removed.   PAST MEDICAL HISTORY:  1. CAD, status post stenting in 2003 under the direction of Dr. Jana Half.     DeGent.  2. Hypertension.  3. Arthritis.  4. Obesity.  5. She denies any diabetes.   PAST SURGICAL HISTORY:   Catheterization in 2003.   SOCIAL HISTORY:  She smokes about a half a pack per day or less of  cigarettes.  She denies any ethanol or illicit drug use.   FAMILY HISTORY:  Mom is deceased secondary to TB.  Dad is deceased of  hypertension and CAD.  She has 5 children who are in good health and she is  a retired Automotive engineer.   ALLERGIES:  Allergies are to PENICILLIN.   MEDICATIONS AT THIS TIME:  1. Hydrochlorothiazide is documented as being 25 mg b.i.d., however, the     patient states she takes a half a tablet b.i.d., so we do need to confirm     this dosing.  2. She takes Plavix 75 mg daily.  3. Metoprolol 50 mg b.i.d.  4. Sulindac 600  mg p.o. b.i.d.  5. Enalapril 5 mg p.o. b.i.d.  6. Imdur 30 mg p.o. daily.  7. Aspirin 81 mg daily.   LABORATORY VALUES:  Her laboratory values reveal sodium of 137, potassium of  3.5, chloride of 108, CO2 of 28, BUN of 28, creatinine of 1.5.  Blood  glucose is 115.  Her white count is 15.8 with 80% neutrophils.  She has a  hemoglobin of 13, hematocrit of 39.2, platelets of 225,000.  Her first set  of point-of-care enzymes are negative.  Her PT/INR are 13.3 and 1,  respectively; PTT is 38.   EKG shows normal sinus rhythm with some minor anterolateral changes; age is  indeterminate on these changes.   Head CT was negative.   Chest CT shows some right hazy basilar areas but this is not being read as  edema or infiltrate.   PHYSICAL EXAMINATION:  VITAL SIGNS:  Temperature is 97.9; blood pressure  initially was 123456, systolic is now down into the 140s; pulse is 70;  respiratory rate 16; she is 99% on room air.  GENERAL:  Generally, the patient is in no acute distress, lying on the  stretcher.  HEENT:  She is normocephalic, atraumatic.  Pupils are equal, round, reactive  to light.  Extraocular muscles are intact.  Mucous membranes are moist.  She  is edentulous with an area in the left upper molar area that has a  possibility  for exposed bone versus residual tooth.  She states that her  glands are swollen.  She does have some tender lymph nodes that are non-  mobile and not excessively enlarged.  NECK:  She has no JVD, no bruits.  CHEST:  Chest is clear to auscultation; no rhonchi, rales or wheezes.  CARDIOVASCULAR:  She has regular rate and rhythm, positive S1 and S2, no S3  or S4.  She has a 2/6 systolic ejection murmur at the left sternal border.  ABDOMEN:  Abdomen is obese, nondistended, with diffuse tenderness and some  focality in the epigastrium.  She has no guarding or rebound.  EXTREMITIES:  Extremities show no clubbing, cyanosis, or edema.  NEUROLOGIC:  Cranial nerves II-XII are intact.  Power is 5/5.  Deep tendon  reflexes are 2+.  Sensation is grossly intact.  She does appear nonfocal.   ASSESSMENT AND PLAN:  This is a 65 year old African American female with a  history of coronary artery disease, status post stenting in 2003, who states  she has had upper respiratory infection symptoms times about a week.  Tonight, she had some coughing episode which terminated in a syncopal  episode.  Although she did complain of chest pain prior to the event, this  may be related either to the coughing episode, but because of the chest  pain, we cannot rule out possible arrhythmia versus myocardial infarction.   1. Cardiovascular:  We will admit her to telemetry to monitor for     arrhythmia.  We will continue her Plavix, metoprolol, ACE and Imdur.  We     will check on her hydrochlorothiazide dosing.  We will check a 2-D     echocardiogram, carotid ultrasound and serial cardiac enzymes.  I have     requested the old chart to be brought to the floor so we can review her     cardiac history.  2. Pulmonary:  There is no evidence for infiltrate on the x-ray or pulmonary     edema.  We will continue her hydrochlorothiazide, once  the dose is     confirmed, and utilize nebulizers as well as a flutter valve. 3.  Gastrointestinal:  She does have some diffuse abdominal pain, especially     in the epigastrium, and we will start her on Protonix.  4. Genitourinary:  We will check a UA/C&S.  5. Endocrine:  We will check a TSH.  6. Infectious disease:  She may have some bronchitis which we will treat     with moxifloxacin.  If her UA comes     back showing urinary tract infection, we may need to change her     antibiotics to cover better her lungs as well as her urine.  7. The patient is a full-code and full-care.                                                Melissa L. Lovena Le, MD    MLT/MEDQ  D:  07/28/2004  T:  07/28/2004  Job:  [id number]

## 2011-08-03 ENCOUNTER — Other Ambulatory Visit: Payer: Self-pay | Admitting: Cardiology

## 2011-09-13 LAB — DIFFERENTIAL
Basophils Relative: 0
Eosinophils Absolute: 0.1
Lymphs Abs: 2.3
Monocytes Relative: 2 — ABNORMAL LOW
Monocytes Relative: 4
Neutro Abs: 10.1 — ABNORMAL HIGH
Neutrophils Relative %: 75
Neutrophils Relative %: 80 — ABNORMAL HIGH

## 2011-09-13 LAB — URINALYSIS, ROUTINE W REFLEX MICROSCOPIC
Nitrite: NEGATIVE
Protein, ur: NEGATIVE
Urobilinogen, UA: 1

## 2011-09-13 LAB — GLUCOSE, CAPILLARY
Glucose-Capillary: 101 — ABNORMAL HIGH
Glucose-Capillary: 106 — ABNORMAL HIGH
Glucose-Capillary: 115 — ABNORMAL HIGH
Glucose-Capillary: 115 — ABNORMAL HIGH
Glucose-Capillary: 115 — ABNORMAL HIGH
Glucose-Capillary: 120 — ABNORMAL HIGH
Glucose-Capillary: 121 — ABNORMAL HIGH
Glucose-Capillary: 122 — ABNORMAL HIGH
Glucose-Capillary: 131 — ABNORMAL HIGH
Glucose-Capillary: 139 — ABNORMAL HIGH
Glucose-Capillary: 155 — ABNORMAL HIGH
Glucose-Capillary: 156 — ABNORMAL HIGH
Glucose-Capillary: 97

## 2011-09-13 LAB — BASIC METABOLIC PANEL
BUN: 15
BUN: 28 — ABNORMAL HIGH
CO2: 25
CO2: 28
Calcium: 8.4
Chloride: 102
Chloride: 103
Creatinine, Ser: 1.25 — ABNORMAL HIGH
Creatinine, Ser: 1.25 — ABNORMAL HIGH
GFR calc Af Amer: 52 — ABNORMAL LOW
GFR calc Af Amer: 60 — ABNORMAL LOW
GFR calc non Af Amer: 43 — ABNORMAL LOW
GFR calc non Af Amer: 49 — ABNORMAL LOW
Glucose, Bld: 107 — ABNORMAL HIGH
Glucose, Bld: 113 — ABNORMAL HIGH
Potassium: 3.9
Potassium: 4
Sodium: 136
Sodium: 138

## 2011-09-13 LAB — CBC
HCT: 26.4 — ABNORMAL LOW
HCT: 27.4 — ABNORMAL LOW
HCT: 31.8 — ABNORMAL LOW
HCT: 40.8
Hemoglobin: 10.4 — ABNORMAL LOW
Hemoglobin: 8.8 — ABNORMAL LOW
Hemoglobin: 9.6 — ABNORMAL LOW
MCHC: 32.8
MCHC: 33.2
MCHC: 33.2
MCV: 84.7
MCV: 85.2
MCV: 85.7
Platelets: 181
Platelets: 227
RBC: 3.18 — ABNORMAL LOW
RBC: 3.39 — ABNORMAL LOW
RBC: 3.65 — ABNORMAL LOW
RDW: 15
RDW: 15
RDW: 15.2
WBC: 11.4 — ABNORMAL HIGH
WBC: 11.6 — ABNORMAL HIGH
WBC: 11.9 — ABNORMAL HIGH
WBC: 12.5 — ABNORMAL HIGH

## 2011-09-13 LAB — PROTIME-INR
INR: 1.1
INR: 1.2
INR: 2.3 — ABNORMAL HIGH
INR: 2.9 — ABNORMAL HIGH
Prothrombin Time: 15.4 — ABNORMAL HIGH
Prothrombin Time: 27 — ABNORMAL HIGH

## 2011-09-13 LAB — COMPREHENSIVE METABOLIC PANEL
Alkaline Phosphatase: 85
BUN: 19
Calcium: 9.7
GFR calc non Af Amer: 44 — ABNORMAL LOW
Glucose, Bld: 126 — ABNORMAL HIGH
Potassium: 4.2
Total Protein: 8.5 — ABNORMAL HIGH

## 2011-09-13 LAB — POCT I-STAT, CHEM 8
BUN: 26 — ABNORMAL HIGH
Hemoglobin: 10.9 — ABNORMAL LOW
Sodium: 140
TCO2: 32

## 2011-09-13 LAB — URINE CULTURE

## 2011-09-22 LAB — CULTURE, ROUTINE-ABSCESS: Gram Stain: NONE SEEN

## 2011-11-02 ENCOUNTER — Encounter: Payer: Self-pay | Admitting: Emergency Medicine

## 2011-11-02 ENCOUNTER — Inpatient Hospital Stay (HOSPITAL_COMMUNITY)
Admission: EM | Admit: 2011-11-02 | Discharge: 2011-11-04 | DRG: 069 | Disposition: A | Discharge status: Home / Self Care | Payer: Medicare Other | Source: Ambulatory Visit | Attending: Internal Medicine | Admitting: Internal Medicine

## 2011-11-02 ENCOUNTER — Emergency Department (HOSPITAL_COMMUNITY): Payer: Medicare Other

## 2011-11-02 DIAGNOSIS — R202 Paresthesia of skin: Secondary | ICD-10-CM

## 2011-11-02 DIAGNOSIS — M129 Arthropathy, unspecified: Secondary | ICD-10-CM

## 2011-11-02 DIAGNOSIS — Z9861 Coronary angioplasty status: Secondary | ICD-10-CM

## 2011-11-02 DIAGNOSIS — E119 Type 2 diabetes mellitus without complications: Secondary | ICD-10-CM | POA: Diagnosis present

## 2011-11-02 DIAGNOSIS — Z96659 Presence of unspecified artificial knee joint: Secondary | ICD-10-CM

## 2011-11-02 DIAGNOSIS — R200 Anesthesia of skin: Secondary | ICD-10-CM

## 2011-11-02 DIAGNOSIS — E785 Hyperlipidemia, unspecified: Secondary | ICD-10-CM | POA: Diagnosis present

## 2011-11-02 DIAGNOSIS — Z7982 Long term (current) use of aspirin: Secondary | ICD-10-CM

## 2011-11-02 DIAGNOSIS — Z880 Allergy status to penicillin: Secondary | ICD-10-CM

## 2011-11-02 DIAGNOSIS — G459 Transient cerebral ischemic attack, unspecified: Secondary | ICD-10-CM

## 2011-11-02 DIAGNOSIS — E669 Obesity, unspecified: Secondary | ICD-10-CM | POA: Diagnosis present

## 2011-11-02 DIAGNOSIS — I129 Hypertensive chronic kidney disease with stage 1 through stage 4 chronic kidney disease, or unspecified chronic kidney disease: Secondary | ICD-10-CM | POA: Diagnosis present

## 2011-11-02 DIAGNOSIS — R531 Weakness: Secondary | ICD-10-CM

## 2011-11-02 DIAGNOSIS — Z79899 Other long term (current) drug therapy: Secondary | ICD-10-CM

## 2011-11-02 DIAGNOSIS — N189 Chronic kidney disease, unspecified: Secondary | ICD-10-CM | POA: Diagnosis present

## 2011-11-02 DIAGNOSIS — D500 Iron deficiency anemia secondary to blood loss (chronic): Secondary | ICD-10-CM

## 2011-11-02 DIAGNOSIS — Z9089 Acquired absence of other organs: Secondary | ICD-10-CM

## 2011-11-02 DIAGNOSIS — I10 Essential (primary) hypertension: Secondary | ICD-10-CM

## 2011-11-02 DIAGNOSIS — R55 Syncope and collapse: Secondary | ICD-10-CM

## 2011-11-02 DIAGNOSIS — F17200 Nicotine dependence, unspecified, uncomplicated: Secondary | ICD-10-CM | POA: Diagnosis present

## 2011-11-02 DIAGNOSIS — Z7902 Long term (current) use of antithrombotics/antiplatelets: Secondary | ICD-10-CM

## 2011-11-02 DIAGNOSIS — I6529 Occlusion and stenosis of unspecified carotid artery: Secondary | ICD-10-CM | POA: Diagnosis present

## 2011-11-02 DIAGNOSIS — I2510 Atherosclerotic heart disease of native coronary artery without angina pectoris: Secondary | ICD-10-CM | POA: Diagnosis present

## 2011-11-02 HISTORY — DX: Reserved for inherently not codable concepts without codable children: IMO0001

## 2011-11-02 HISTORY — DX: Unspecified osteoarthritis, unspecified site: M19.90

## 2011-11-02 HISTORY — DX: Essential (primary) hypertension: I10

## 2011-11-02 HISTORY — DX: Gastro-esophageal reflux disease without esophagitis: K21.9

## 2011-11-02 HISTORY — DX: Atherosclerotic heart disease of native coronary artery without angina pectoris: I25.10

## 2011-11-02 HISTORY — DX: Encounter for other specified aftercare: Z51.89

## 2011-11-02 LAB — GLUCOSE, CAPILLARY
Glucose-Capillary: 121 mg/dL — ABNORMAL HIGH (ref 70–99)
Glucose-Capillary: 134 mg/dL — ABNORMAL HIGH (ref 70–99)
Glucose-Capillary: 199 mg/dL — ABNORMAL HIGH (ref 70–99)
Glucose-Capillary: 80 mg/dL (ref 70–99)

## 2011-11-02 LAB — CBC
HCT: 39.5 % (ref 36.0–46.0)
HCT: 39.4 % (ref 36.0–46.0)
Hemoglobin: 12.9 g/dL (ref 12.0–15.0)
Hemoglobin: 13.1 g/dL (ref 12.0–15.0)
MCHC: 32.7 g/dL (ref 30.0–36.0)
MCHC: 33.2 g/dL (ref 30.0–36.0)
MCV: 85.9 fL (ref 78.0–100.0)
MCV: 85.5 fL (ref 78.0–100.0)
RDW: 16.1 % — ABNORMAL HIGH (ref 11.5–15.5)

## 2011-11-02 LAB — RAPID URINE DRUG SCREEN, HOSP PERFORMED
Barbiturates: NOT DETECTED
Cocaine: NOT DETECTED
Opiates: NOT DETECTED

## 2011-11-02 LAB — DIFFERENTIAL
Basophils Absolute: 0.1 10*3/uL (ref 0.0–0.1)
Basophils Relative: 1 % (ref 0–1)
Eosinophils Relative: 3 % (ref 0–5)
Monocytes Absolute: 0.7 10*3/uL (ref 0.1–1.0)
Monocytes Relative: 7 % (ref 3–12)

## 2011-11-02 LAB — CREATININE, SERUM
GFR calc Af Amer: 51 mL/min — ABNORMAL LOW (ref >90)
GFR calc non Af Amer: 44 mL/min — ABNORMAL LOW (ref >90)

## 2011-11-02 LAB — COMPREHENSIVE METABOLIC PANEL
AST: 16 U/L (ref 0–37)
BUN: 35 mg/dL — ABNORMAL HIGH (ref 6–23)
CO2: 26 mEq/L (ref 19–32)
Calcium: 9.7 mg/dL (ref 8.4–10.5)
Creatinine, Ser: 1.44 mg/dL — ABNORMAL HIGH (ref 0.50–1.10)
GFR calc non Af Amer: 37 mL/min — ABNORMAL LOW (ref >90)
Total Bilirubin: 0.3 mg/dL (ref 0.3–1.2)

## 2011-11-02 LAB — CARDIAC PANEL(CRET KIN+CKTOT+MB+TROPI)
CK, MB: 2.9 ng/mL (ref 0.3–4.0)
CK, MB: 2.6 ng/mL (ref 0.3–4.0)
Relative Index: 2.3 (ref 0.0–2.5)
Relative Index: 2.2 (ref 0.0–2.5)
Total CK: 125 U/L (ref 7–177)
Total CK: 117 U/L (ref 7–177)

## 2011-11-02 LAB — CK TOTAL AND CKMB (NOT AT ARMC)
CK, MB: 2.8 ng/mL (ref 0.3–4.0)
Relative Index: 2.1 (ref 0.0–2.5)
Total CK: 131 U/L (ref 7–177)

## 2011-11-02 LAB — HEMOGLOBIN A1C
Hgb A1c MFr Bld: 6.3 % — ABNORMAL HIGH (ref <5.7)
Mean Plasma Glucose: 134 mg/dL — ABNORMAL HIGH (ref <117)

## 2011-11-02 MED ORDER — INSULIN ASPART 100 UNIT/ML ~~LOC~~ SOLN
0.0000 [IU] | SUBCUTANEOUS | Status: DC
  Administered 2011-11-02: 1 [IU] via SUBCUTANEOUS
  Administered 2011-11-02: 2 [IU] via SUBCUTANEOUS
  Filled 2011-11-02: qty 3

## 2011-11-02 MED ORDER — ENOXAPARIN SODIUM 30 MG/0.3ML ~~LOC~~ SOLN
30.0000 mg | Freq: Two times a day (BID) | SUBCUTANEOUS | Status: DC
  Administered 2011-11-02 – 2011-11-04 (×4): 30 mg via SUBCUTANEOUS
  Filled 2011-11-02 (×5): qty 0.3

## 2011-11-02 MED ORDER — ASPIRIN 81 MG PO CHEW
324.0000 mg | CHEWABLE_TABLET | Freq: Once | ORAL | Status: AC
  Administered 2011-11-02: 324 mg via ORAL
  Filled 2011-11-02: qty 4

## 2011-11-02 MED ORDER — SODIUM CHLORIDE 0.9 % IJ SOLN
3.0000 mL | Freq: Two times a day (BID) | INTRAMUSCULAR | Status: DC
  Administered 2011-11-02 – 2011-11-04 (×5): 3 mL via INTRAVENOUS

## 2011-11-02 MED ORDER — CLOPIDOGREL BISULFATE 75 MG PO TABS
75.0000 mg | ORAL_TABLET | Freq: Every day | ORAL | Status: DC
  Administered 2011-11-03 – 2011-11-04 (×2): 75 mg via ORAL
  Filled 2011-11-02 (×2): qty 1

## 2011-11-02 MED ORDER — GLIPIZIDE 5 MG PO TABS
5.0000 mg | ORAL_TABLET | Freq: Two times a day (BID) | ORAL | Status: DC
  Administered 2011-11-02 – 2011-11-04 (×4): 5 mg via ORAL
  Filled 2011-11-02 (×6): qty 1

## 2011-11-02 MED ORDER — FAMOTIDINE 20 MG PO TABS
20.0000 mg | ORAL_TABLET | Freq: Two times a day (BID) | ORAL | Status: DC
  Administered 2011-11-02 – 2011-11-04 (×4): 20 mg via ORAL
  Filled 2011-11-02 (×6): qty 1

## 2011-11-02 MED ORDER — ACETAMINOPHEN 325 MG PO TABS
650.0000 mg | ORAL_TABLET | ORAL | Status: DC | PRN
  Administered 2011-11-03: 650 mg via ORAL
  Filled 2011-11-02: qty 2

## 2011-11-02 NOTE — ED Notes (Signed)
Received patient and assumed care, received patient resting quietly on stretcher with eyes open on cell phone c/o numbness to left arm, pending re evaluation

## 2011-11-02 NOTE — Progress Notes (Signed)
Pt was admitted to unit from ed with dx of left sided weakness. Pt alert and oriented x4. Speech clear. Ambulatory. Continent of bowel and bladder. Refused to take off her pants so her skin could be assessed. Arms and abdomen look good. LBM 11/19. Pt from home alone. Placed on telemetry. Complains of some numbness in her left hand and toes. Denies any pain or nausea. Will cont to monitor.

## 2011-11-02 NOTE — ED Provider Notes (Signed)
History     CSN: [id number] Arrival date & time: 11/02/2011  5:41 AM   First MD Initiated Contact with Patient 11/02/11 [phone]      Chief Complaint  Patient presents with  . Hypertension    (Consider location/radiation/quality/duration/timing/severity/associated sxs/prior treatment) HPI Comments: 65 year old female who presents with hypertension and left-sided numbness. Patient states she awoke with hypertension and left-sided numbness Symptoms are constant Nothing makes better or worse Symptoms are gradually improving Currently patient feels some numbness and tingling in the bottom of her fingers on the left hand this numbness extends proximally to just about the elbow. She also noted similar feelings in her left leg. Denies fevers chills, nausea or vomiting, weakness, difficulty speaking, changes in vision. She does admit to having a fullness or heaviness and numbness of the tongue as well. She denies history of stroke but does have a history of coronary disease status post stent in 2004 she denies missing any of her medications  Patient is a 65 y.o. female presenting with hypertension. The history is provided by the patient.  Hypertension Pertinent negatives include no chest pain, no abdominal pain, no headaches and no shortness of breath.    Past Medical History  Diagnosis Date  . Hypertension   . Coronary artery disease   . Blood transfusion     no side affects  . GERD (gastroesophageal reflux disease)   . Arthritis   . Diabetes mellitus     Borderline    Past Surgical History  Procedure Date  . Total knee arthroplasty   . Cholecystectomy   . Coronary angioplasty with stent placement     History reviewed. No pertinent family history.  History  Substance Use Topics  . Smoking status: Current Everyday Smoker -- 30 years    Types: Cigarettes  . Smokeless tobacco: Never Used   Comment: Smoking cessation requested   . Alcohol Use: No    OB History    Grav Para  Term Preterm Abortions TAB SAB Ect Mult Living                  Review of Systems  Constitutional: Negative for fever and chills.  HENT: Negative for sore throat and neck pain.   Eyes: Negative for visual disturbance.  Respiratory: Negative for cough and shortness of breath.   Cardiovascular: Negative for chest pain.  Gastrointestinal: Negative for nausea, vomiting, abdominal pain and diarrhea.  Genitourinary: Negative for dysuria and frequency.  Musculoskeletal: Negative for back pain.  Skin: Negative for rash.  Neurological: Positive for numbness. Negative for tremors, seizures, syncope, facial asymmetry, speech difficulty, weakness, light-headedness and headaches.  Hematological: Negative for adenopathy.  Psychiatric/Behavioral: Negative for behavioral problems.    Allergies  Penicillins  Home Medications   No current outpatient prescriptions on file.  BP 170/98  Pulse 65  Temp(Src) 98.1 F (36.7 C) (Oral)  Resp 17  SpO2 98%  Physical Exam  Nursing note and vitals reviewed. Constitutional: She appears well-developed and well-nourished. No distress.  HENT:  Head: Normocephalic and atraumatic.  Mouth/Throat: Oropharynx is clear and moist. No oropharyngeal exudate.  Eyes: Conjunctivae and EOM are normal. Pupils are equal, round, and reactive to light. Right eye exhibits no discharge. Left eye exhibits no discharge. No scleral icterus.  Neck: Normal range of motion. Neck supple. No JVD present. No thyromegaly present.  Cardiovascular: Normal rate, regular rhythm, normal heart sounds and intact distal pulses.  Exam reveals no gallop and no friction rub.   No murmur heard.  Pulmonary/Chest: Effort normal and breath sounds normal. No respiratory distress. She has no wheezes. She has no rales.  Abdominal: Soft. Bowel sounds are normal. She exhibits no distension and no mass. There is no tenderness.  Musculoskeletal: Normal range of motion. She exhibits no edema and no  tenderness.  Lymphadenopathy:    She has no cervical adenopathy.  Neurological: She is alert. Coordination normal.       Speech normal, cranial nerves III through XII normal, gross visual acuity normal, peripheral visual fields intact, rapid alternating movements without difficulty, finger nose finger without limb ataxia, strength of extremities 5 out of 5 in bilateral upper extremities and right lower extremity, 4.5 out of 5 in the left lower extremity at the hip flexor. Sensation decreased in the left upper and lower extremities. Reflexes decreased bilaterally  Skin: Skin is warm and dry. No rash noted. No erythema.  Psychiatric: She has a normal mood and affect. Her behavior is normal.    ED Course  Procedures (including critical care time)  Labs Reviewed  APTT - Abnormal; Notable for the following:    aPTT 41 (*)    All other components within normal limits  CBC - Abnormal; Notable for the following:    RDW 16.1 (*)    All other components within normal limits  COMPREHENSIVE METABOLIC PANEL - Abnormal; Notable for the following:    BUN 35 (*)    Creatinine, Ser 1.44 (*)    Albumin 3.4 (*)    GFR calc non Af Amer 37 (*)    GFR calc Af Amer 42 (*)    All other components within normal limits  CBC - Abnormal; Notable for the following:    RDW 16.1 (*)    All other components within normal limits  CREATININE, SERUM - Abnormal; Notable for the following:    Creatinine, Ser 1.24 (*)    GFR calc non Af Amer 44 (*)    GFR calc Af Amer 51 (*)    All other components within normal limits  PROTIME-INR  DIFFERENTIAL  CK TOTAL AND CKMB  TROPONIN I  CARDIAC PANEL(CRET KIN+CKTOT+MB+TROPI)  GLUCOSE, CAPILLARY  HIV ANTIBODY (ROUTINE TESTING)  CARDIAC PANEL(CRET KIN+CKTOT+MB+TROPI)  POCT CBG MONITORING  GLUCOSE, POCT (MANUAL RESULT ENTRY)  GLUCOSE, POCT (MANUAL RESULT ENTRY)  HEMOGLOBIN A1C  URINE RAPID DRUG SCREEN (HOSP PERFORMED)   Ct Head Wo Contrast  11/02/2011  *RADIOLOGY  REPORT*  Clinical Data: Left-sided weakness.  CT HEAD WITHOUT CONTRAST  Technique:  Contiguous axial images were obtained from the base of the skull through the vertex without contrast.  Comparison: 10/07/2008  Findings: The brain stem, cerebellum, cerebral peduncles, thalami, basal ganglia, basilar cisterns, and ventricular system appear unremarkable.  No intracranial hemorrhage, mass lesion, or acute infarction is identified.  Chronic left sphenoid sinusitis noted.  IMPRESSION:  1.  Chronic left sphenoid sinusitis.   Otherwise, no significant abnormality identified.  Original Report Authenticated By: Carron Curie, M.D.     1. Numbness and tingling of left arm and leg       MDM  Physical exam consistent with mild left-sided sensory deficits. Patient awoke with these symptoms, currently has very very low NIH stroke scale. Hypertension is significantly elevated patient noting blood pressure at home to be [id number] systolic, currently [id number]. Other vital signs appear normal. We'll pursue stroke protocol though she is outside of any window for thrombolytics at this time given that she awoke with symptoms.  She has had an aspirin in the  last 24 hours    BP rechecked 157/50 at 0650  Pt has ongoing sx including numbness   ED ECG REPORT   Date: 11/02/2011   Rate: 73  Rhythm: normal sinus rhythm  QRS Axis: normal  Intervals: normal  ST/T Wave abnormalities: normal  Conduction Disutrbances:none  Narrative Interpretation: PAC's, Q waves anterior leads  Old EKG Reviewed: unchanged from 10/07/08  CT negative for acute findings, labs unremarkable - admitted to internal medicine service    Johnna Acosta, MD 11/02/11 [phone]

## 2011-11-02 NOTE — ED Notes (Signed)
ASSUMED CARE ON PT. INTRODUCED SELF TO PT. , CALL LIGHT WITHIN REACH , BEDRAILS UP , RESPIRATIONS UNLBAORED, NO PAIN . PT. WAITING FOR EDP/PA EVALUATION .

## 2011-11-02 NOTE — ED Notes (Signed)
RETURNED FROM CT SCAN

## 2011-11-02 NOTE — Consult Note (Signed)
Pt smokes 7 cigarettes per day. She is in contemplation stage about quitting. Wants to know different options to quit with medical aids which was fully discussed with the pt. Referred to 1-800 quit now for f/u and support. Discussed oral fixation substitutes, second hand smoke and in home smoking policy. Reviewed and gave pt Written education/contact information.

## 2011-11-02 NOTE — ED Notes (Signed)
Patient transported to CT 

## 2011-11-02 NOTE — H&P (Signed)
Hospital Admission Note Date: 11/02/2011  Patient name: Kathleen [name] Medical record number: [id number] Date of birth: 1946-06-15 Age: 65 y.o. Gender: female PCP: Dr. Bo Mcclintock  Medical Service: Internal medicine teaching service - [facility] service  Attending physician:  Dr. Lynnae January  1st Contact:  Dr. Rosine Door  Pager: [phone] 2nd Contact: Dr. Obie Dredge  Pager: [phone]  After 5 pm or weekends: 1st Contact:  Pager: [phone] 2nd Contact:  Pager: [phone]  Chief Complaint: Left sided tingling and weakness  History of Present Illness: Kathleen [name] is a 65 year old female with past medical history significant for diabetes, hypertension,  and CAD status post RCA stenting who presents with acute onset of left upper and left lower extremity tingling and weakness. She states she awoke at 4 AM on the day of admission with a severe tingling/burning sensation in her left upper and left lower extremity associated with some weakness.  She took her blood pressure upon awakening and noted that it was significantly elevated with a systolic reading of [id number]. She often checks her pressure at home and notes her systolic blood pressure typically ranges between 140 to 150.  She denies any slurred speech, difficulty walking, chest pain, headache, visual changes, or syncope. She reports experiencing left upper extremity tingling when she was admitted and stented for her coronary artery disease but states her current symptoms are very different in nature.  She feels her symptoms are slightly improved from this morning but she continues to have mild tingling/numbness and slight weakness in her left upper and left lower extremity. She admits to feeling slightly lightheaded this morning but denies syncope or loss of consciousness. She denies nausea, vomiting, diarrhea, abdominal pain, fever, chills, sweats, bright red blood per rectum, dark tarry stools, dysuria, shortness of breath, dyspnea on exertion, or other associated  symptom/complaint.  She reports 100% compliance with daily aspirin and notes she occasionally takes 2 baby aspirin a day.  She states her diabetes is well controlled and her blood sugars have been within normal limits over the past few weeks.  Meds: Aspirin 81 mg 1 tab daily (patient states she often takes one tab twice a day) Glucotrol 5 mg 1 tab by mouth twice a day a.c. Lisinopril 20 mg 1 tab by mouth daily Amlodipine 2.5 mg tabs one tab by mouth daily Chlorthalidone 25 mg 1 tab by mouth daily Pepcid 20 mg tabs 1 tab by mouth twice a day  Allergies: Penicillins: Patient is unable to recall allergic reaction; notes she had problems with PCN as a child  Past Medical History  Diagnosis Date  Hypertension   Diabetes mellitus  - last A1c 6.o in 06/2010   Coronary artery disease  - s/p stenting to RCA in 09/2002  - follows with Dr. Percival Spanish, [facility] Tobacco use    Past Surgical History  Procedure Date  . Total knee arthroplasty   . Cholecystectomy    Family history: Mother: Deceased as a result from complications related to tuberculosis infection (pt reports negative ppd) Father: Diabetes mellitus, coronary artery disease with MI age ~33, CVA  Social history: Patient lives alone in [place]. She is divorced and has numerous children that live near her. She is a current smoker; she smoked one half of a pack per day for 35 years. She denies alcohol or illicit drug use.  Review of Systems: Pertinent items are noted in HPI.  Physical Exam: Blood pressure 180/58, pulse 79, temperature 97.8 F (36.6 C), temperature source Oral, resp. rate 20,  SpO2 95.00%. GEN: No apparent distress.  Alert and oriented x 3.  Pleasant, conversant, and cooperative to exam. HEENT: head is autraumatic and normocephalic.  Neck is supple without palpable masses or lymphadenopathy.  No JVD or carotid bruits.  Vision intact.  EOMI.  PERRLA.  Sclerae anicteric.  Conjunctivae without pallor or  injection. Mucous membranes are moist.  Oropharynx is without erythema, exudates, or other abnormal lesions.   RESP:  Lungs are clear to ascultation bilaterally with good air movement.  No wheezes, ronchi, or rubs. CARDIOVASCULAR: regular rate, normal rhythm.  Clear S1, S2, no murmurs, gallops, or rubs. ABDOMEN: soft, non-tender, non-distended.  Bowels sounds present in all quadrants and slightly hypoactive.  No palpable masses. EXT: warm and dry.  Peripheral pulses equal, intact, and +2 globally.  No clubbing or cyanosis.  No  edema in bilateral lower extremities. SKIN: warm and dry with normal turgor.  No rashes or abnormal lesions observed. NEURO: CN II-XII grossly intact.  Muscle strength +5/5 in right upper and lower extremity. +4 grip and muscle strength in left upper extremity, +4 muscle strength in left lower extremity. Sensation decreased along lateral aspect of left upper extremity (C5-C8 distribution) as well as medial aspect (L4, L5 distribution) of left lower extremity.  Rapid alternating movements and finger-to-nose intact.  Lab results: Basic Metabolic Panel:  [facility] 11/02/11 0622  NA 140  K 4.0  CL 104  CO2 26  GLUCOSE 90  BUN 35*  CREATININE 1.44*  CALCIUM 9.7  MG --  PHOS --   Liver Function Tests:  [facility] 11/02/11 0622  AST 16  ALT 17  ALKPHOS 86  BILITOT 0.3  PROT 7.9  ALBUMIN 3.4*   CBC:  [facility] 11/02/11 0622  WBC 10.2  NEUTROABS 6.0  HGB 12.9  HCT 39.5  MCV 85.9  PLT 211   Cardiac Enzymes:  Basename 11/02/11 0621  CKTOTAL 131  CKMB 2.8  CKMBINDEX --  TROPONINI <0.30   Coagulation:  Basename 11/02/11 0622  LABPROT 13.4  INR 1.00    Imaging results:  Ct Head Wo Contrast  11/02/2011  *RADIOLOGY REPORT*  Clinical Data: Left-sided weakness.  CT HEAD WITHOUT CONTRAST  Technique:  Contiguous axial images were obtained from the base of the skull through the vertex without contrast.  Comparison: 10/07/2008  Findings: The brain stem,  cerebellum, cerebral peduncles, thalami, basal ganglia, basilar cisterns, and ventricular system appear unremarkable.  No intracranial hemorrhage, mass lesion, or acute infarction is identified.  Chronic left sphenoid sinusitis noted.  IMPRESSION:  1.  Chronic left sphenoid sinusitis.   Otherwise, no significant abnormality identified.  Original Report Authenticated By: Carron Curie, M.D.    Other results: EKG: Normal sinus rhythm with PACs. Normal axis. Normal intervals. Q waves in V1 V2 and V3. No ST segment abnormalities. Unchanged from prior EKG in October 2009.  Assessment & Plan by Problem:  #Left upper and lower extremity weakness:  Patient presents with acute onset of left upper and lower extremity paresthesia and weakness.   She has numerous risk factors for CVA/TIA and it is likely her current symptoms represent an acute stroke given the persistence of her symptoms.  CT head was negative for hemorrhage, CNS lesion, SDH/SAH, or other acute process.  It is less probable that her symptoms reflect nerve entrapment or peripheral neuropathy.  If her symptoms are the result of an acute CVA, she has failed ASA therapy.  She is not experiencing chest pain, SOB/DOE, or symptoms similar to her  prior cardiac event; additionally EKG is without evidence of ischemia or other concerning process.  Despite the evidence against ACS/UA as a cause of her symptoms, it is still reasonable to rule out ACS given her hx and the atypical presentations of ACS in women and diabetic patients. - Will admit to telemetry - Obtain MRI to evaluate for acute stroke - Check carotid dopplers to assess for stenosis given established vascular disease (CAD) - Start Plavix and discontinue ASA - Obtain fasting lipid panel and repeat HbA1c - Cycle cardiac enzymes and repeat 12 lead EKG in the morning - Will defer 2D transthoracic echocardiography at this time.  She is currently in NSR and TEE would be preferred (more  sensitive) for evaluation of atrial thrombus/PFO. - Discussed importance of smoking cessation for risk factor modification. - Check UDS - PT/OT consult  #DM: Pt reports good control of CBGs at home; her last A1c was 6.0 in 06/2010. - Repeat HbA1c - Continue glucotrol - Monitor CBGs  - SSI for additional CBG coverage  #HTN: Will allow permissive hypertension in the setting of possible CVA and continue to monitor BP closely. - Hold home antihypertensive agents - Continue to monitor   #CAD: this appears stable.  EKG is without evidence of ischemia or other concerning process and is unchanged from prior EKG. She is currently not on beta-blocker therapy; would consider adding this to her medication regimen (or substituting beta blocker and place of calcium channel blocker for HTN) for mortality benefit in coronary disease.  Will transition from ASA to Plavix as outlined above. - Work up as outlined in problem #1 - Consider addition of beta-blocker  #CKI: Cr is currently at baseline of 1.3-1.4. - Will continue to follow  #Tobacco use: pt declines nicotine patch at this time. - Will obtain smoking cessation consult  #DVT prophylaxis:  lovenox  Signed: Zamiah Tollett 11/02/2011, 8:43 AM

## 2011-11-02 NOTE — ED Notes (Signed)
PT. WOKE UP THIS MORNING WITH LEFT HAND NUMBNESS  AND ELEVATED BP= 212/81 .

## 2011-11-03 ENCOUNTER — Other Ambulatory Visit: Payer: Self-pay

## 2011-11-03 ENCOUNTER — Inpatient Hospital Stay (HOSPITAL_COMMUNITY): Payer: Medicare Other

## 2011-11-03 DIAGNOSIS — R209 Unspecified disturbances of skin sensation: Secondary | ICD-10-CM

## 2011-11-03 LAB — LIPID PANEL
Total CHOL/HDL Ratio: 3.9 RATIO
VLDL: 11 mg/dL (ref 0–40)

## 2011-11-03 LAB — CARDIAC PANEL(CRET KIN+CKTOT+MB+TROPI)
CK, MB: 2.2 ng/mL (ref 0.3–4.0)
Total CK: 108 U/L (ref 7–177)

## 2011-11-03 MED ORDER — SIMVASTATIN 10 MG PO TABS
10.0000 mg | ORAL_TABLET | Freq: Every day | ORAL | Status: DC
  Administered 2011-11-03: 10 mg via ORAL
  Filled 2011-11-03 (×2): qty 1

## 2011-11-03 MED ORDER — INSULIN ASPART 100 UNIT/ML ~~LOC~~ SOLN: 0.0000 [IU] | Freq: Three times a day (TID) | SUBCUTANEOUS | Status: DC

## 2011-11-03 MED ORDER — LISINOPRIL 20 MG PO TABS
20.0000 mg | ORAL_TABLET | Freq: Every day | ORAL | Status: DC
  Administered 2011-11-03 – 2011-11-04 (×2): 20 mg via ORAL
  Filled 2011-11-03 (×2): qty 1

## 2011-11-03 MED ORDER — INSULIN ASPART 100 UNIT/ML ~~LOC~~ SOLN
0.0000 [IU] | Freq: Three times a day (TID) | SUBCUTANEOUS | Status: DC
  Filled 2011-11-03: qty 3

## 2011-11-03 MED ORDER — CHLORTHALIDONE 25 MG PO TABS
25.0000 mg | ORAL_TABLET | Freq: Every day | ORAL | Status: DC
  Administered 2011-11-03 – 2011-11-04 (×2): 25 mg via ORAL
  Filled 2011-11-03 (×2): qty 1

## 2011-11-03 NOTE — Clinical Documentation Improvement (Signed)
Clinical Documentation Improvement Program   THIS DOCUMENT IS NOT A PERMANENT PART OF THE MEDICAL RECORD  Query Request  Dr.Mills / Associates:  Dear Dr. In an effort to better capture your patient's severity of illness, reflect appropriate length of stay and utilization of resources, a review of the patient medical record has revealed the following indicators. Please exercise your independent judgment. The fact queries are asked, does not imply that any particular answer is desired or expected. Please clarify and document in a progress note and/or discharge summary the clinical condition associated with the following supporting information. Conditions documented as possible, probable, or suspected can be coded if restated at the time of discharge.   TO RESPOND TO THE THIS QUERY, FOLLOW THE INSTRUCTIONS BELOW:  1. If needed, update documentation for the patient's encounter via the notes activity.  2. Access this query again and click edit on the [employer].  3. After updating, or not, click F2 to complete all highlighted (required) fields concerning your review. Select "additional documentation in the medical record" OR "no additional documentation provided". 4. Click Sign note button.  5. The deficiency will fall out of your InBasket  *Please let us know if you are not able to compete this workflow by phone or e-mail (listed below).    "Left sided weakness" is written in the H&P. Please consider the below (if your clinical findings/judgment agree) as you document the patient's condition(s) in the progress note and discharge summary. Thank you!   Possible Clinical Conditions?  - Left Hemiparesis  - Other condition (please document in the progress notes and/or discharge summary)  - Cannot Clinically determine at this time   Supporting Information:  - "Muscle strength +5/5 in right upper and lower extremity. +4 grip and muscle strength in left upper extremity, +4 muscle  strength in left lower extremity. Sensation decreased along lateral aspect of left upper extremity (C5-C8 distribution) as well as medial aspect (L4, L5 distribution) of left lower extremity." H&P  - "Left upper and lower extremity weakness: Patient presents with acute onset of left upper and lower extremity paresthesia and weakness." H&P  - "She has numerous risk factors for CVA/TIA and it is likely her current symptoms represent an acute stroke given the persistence of her symptoms" H&P   Thank you. If you have any questions, please contact me:  Daisee Centner, [place] Email: [email or web address]   THIS DOCUMENT IS NOT A PERMANENT PART OF THE MEDICAL RECORD   No additional documentation in chart upon review.

## 2011-11-03 NOTE — Progress Notes (Signed)
Order received and chart reviewed. Spoke with patient and family who report patient is at baseline functioning with mild tingling still present in left hand that does not impact function. No further acute OT indicated at this time. Signing off.  Darci Current    OTR/L Pager: [phone] 11/03/2011

## 2011-11-03 NOTE — H&P (Signed)
Internal Medicine Teaching Service Attending Note Date: 11/03/2011  Patient name: Kathleen [name]  Medical record number: [id number]  Date of birth: 1946-02-02   I have seen and evaluated Kathleen [name] and discussed their care with the Residency Team.    Kathleen [name] has known CAD, DM, HTN, and tobacco use and woke from sleep at 4 AM on the day of admission with a severe tingling/burning sensation in her left upper and left lower extremity associated with some weakness. She took her blood pressure upon awakening and noted that it was significantly elevated with a systolic reading of [id number], which is elevated from her normal level of 140 to 150. She called her daughter to tell her to drive her to the ED and the daughter noticed slurred speech.   She feels her symptoms are slightly improved from this morning but she continues to have mild tingling/numbness and slight weakness in her left upper and left lower extremity. The weakness is not interfering with ambulation.  Physical Exam: Blood pressure 184/77, pulse 62, temperature 98.6 F (37 C), temperature source Oral, resp. rate 18, height 5\' 4"  (1.626 m), weight 260 lb (117.935 kg), SpO2 96.00%. Gen : NAD lying in bed HRRR no MRG LCTA ABD + BS S/NT/ND Neuro : slight decrease of L grip 4+/5. R grip 5/5. 4+ UE strength (shoulder, arm) on L. 5/5 on R. 4+ LLE strength (dorsi and plantar flexion & hip flexion). RLE 5/5. Altered sensation of L hand, more on ulnar surface. Face symmetric. Speech nl.  Lab results: Results for orders placed during the hospital encounter of 11/02/11 (from the past 24 hour(s))  CARDIAC PANEL(CRET KIN+CKTOT+MB+TROPI)     Status: Normal   Collection Time   11/02/11  7:18 PM      Component Value Range   Total CK 117  7 - 177 (U/L)   CK, MB 2.6  0.3 - 4.0 (ng/mL)   Troponin I <0.30  <0.30 (ng/mL)   Relative Index 2.2  0.0 - 2.5   GLUCOSE, CAPILLARY     Status: Abnormal   Collection Time   11/02/11  8:12 PM   Component Value Range   Glucose-Capillary 134 (*) 70 - 99 (mg/dL)  GLUCOSE, CAPILLARY     Status: Abnormal   Collection Time   11/02/11 11:46 PM      Component Value Range   Glucose-Capillary 199 (*) 70 - 99 (mg/dL)  CARDIAC PANEL(CRET KIN+CKTOT+MB+TROPI)     Status: Normal   Collection Time   11/03/11  3:33 AM      Component Value Range   Total CK 108  7 - 177 (U/L)   CK, MB 2.2  0.3 - 4.0 (ng/mL)   Troponin I <0.30  <0.30 (ng/mL)   Relative Index 2.0  0.0 - 2.5   LIPID PANEL     Status: Abnormal   Collection Time   11/03/11  3:33 AM      Component Value Range   Cholesterol 224 (*) 0 - 200 (mg/dL)   Triglycerides 53  <150 (mg/dL)   HDL 58  >39 (mg/dL)   Total CHOL/HDL Ratio 3.9     VLDL 11  0 - 40 (mg/dL)   LDL Cholesterol 155 (*) 0 - 99 (mg/dL)  GLUCOSE, CAPILLARY     Status: Normal   Collection Time   11/03/11  4:51 AM      Component Value Range   Glucose-Capillary 72  70 - 99 (mg/dL)  GLUCOSE, CAPILLARY  Status: Abnormal   Collection Time   11/03/11 11:40 AM      Component Value Range   Glucose-Capillary 106 (*) 70 - 99 (mg/dL)  GLUCOSE, CAPILLARY     Status: Normal   Collection Time   11/03/11  4:43 PM      Component Value Range   Glucose-Capillary 84  70 - 99 (mg/dL)    Imaging results:  Ct Head Wo Contrast  11/02/2011  *RADIOLOGY REPORT*  Clinical Data: Left-sided weakness.  CT HEAD WITHOUT CONTRAST  Technique:  Contiguous axial images were obtained from the base of the skull through the vertex without contrast.  Comparison: 10/07/2008  Findings: The brain stem, cerebellum, cerebral peduncles, thalami, basal ganglia, basilar cisterns, and ventricular system appear unremarkable.  No intracranial hemorrhage, mass lesion, or acute infarction is identified.  Chronic left sphenoid sinusitis noted.  IMPRESSION:  1.  Chronic left sphenoid sinusitis.   Otherwise, no significant abnormality identified.  Original Report Authenticated By: Carron Curie, M.D.   Mri  Brain Without Contrast  11/03/2011  *RADIOLOGY REPORT*  Clinical Data: 65 year old female with left-sided weakness and numbness.  MRI HEAD WITHOUT CONTRAST  Technique:  Multiplanar, multiecho pulse sequences of the brain and surrounding structures were obtained according to standard protocol without intravenous contrast.  Comparison: Head CTs 11/02/2011 and earlier.  Findings: On diffusion weighted images there is mildly heterogeneous diffusion in the left paracentral pons (series 4 image 40), but this is above the level of the pyramidal decussation and would be expected to cause right-sided symptoms, therefore I suspect this is artifact.  No restricted diffusion to suggest acute infarction.  No midline shift, mass effect, evidence of mass lesion, ventriculomegaly, extra-axial collection or acute intracranial hemorrhage. Cervicomedullary junction and pituitary are within normal limits. Major intracranial vascular flow voids are preserved. Pearline Cables and white matter signal is within normal limits for age throughout the brain.  Negative visualized cervical spine.  Visualized bone marrow signal is within normal limits.  Hypoplastic partially opacified sphenoid sinuses.  The maxillary sinuses and frontal sinuses also are hypoplastic.  The mastoids are clear. Visualized orbit soft tissues are within normal limits.  Visualized scalp soft tissues are within normal limits.  IMPRESSION: 1. No acute intracranial abnormality.  Negative for age noncontrast MRI appearance the brain. 2.  Chronic sphenoid sinus disease.  Original Report Authenticated By: Randall An, M.D.    Assessment and Plan: I agree with the formulated Assessment and Plans: 1. LLE and LUE weakness and altered sensation - sxs are most c/w and concerning for TIA / CVA. MRI is negative for CVA. The length of sxs make TIA unusual. Will proceed with Dopplers as pt has sig RF for vascular dz. ASA was D/C'd and replaced with Plavix when CVA was the most likely  dx. Pt has Medicaid so will be able to obtain the Plavix. It might be prudent to leave her on it and reassess after Dopplers and further sxs improvement.   If this is not a TIA, I cannot think of another dx that would cause these sxs to suddenly appear. She is at risk for neuropathy but that is not a sudden onset. Nerve compression - central or peripheral - but would have to hit cervical and lumbar.  2. CAD - She is on an ASA. No statin and LDL is 155. Will add statin. Will also change CCB to BB.  3. HTN - permissive HTN was allowed when she was felt to have an ischemic CVA. Will  resume ACEI and diuretic and add BB later and D/C CCB.  4. Tobacco use - pt know this is harmful. Her MD at [facility] has also told her to quit.

## 2011-11-03 NOTE — Clinical Documentation Improvement (Signed)
CKD DOCUMENTATION CLARIFICATION QUERY    THIS DOCUMENT IS NOT A PERMANENT PART OF THE MEDICAL RECORD  Please update your documentation within the medical record to reflect your response to this query.                                                                                         11/03/11    Dear Dr.Mills / Associates,  In a better effort to capture your patient's severity of illness, reflect appropriate length of stay and utilization of resources, a review of the patient medical record has revealed the following indicators.   Based on your clinical judgment, please clarify and document in a progress note and/or discharge summary the clinical condition associated with the following supporting information: In responding to this query please exercise your independent judgment.  The fact that a query is asked, does not imply that any particular answer is desired or expected.  TO RESPOND TO THE THIS QUERY, FOLLOW THE INSTRUCTIONS BELOW:  1. If needed, update documentation for the patient's encounter via the notes activity.  2. Access this query again and click edit on the [employer].  3. After updating, or not, click F2 to complete all highlighted (required) fields concerning your review. Select "additional documentation in the medical record" OR "no additional documentation provided". 4. Click Sign note button.  5. The deficiency will fall out of your InBasket  *Please let us know if you are not able to compete this workflow by phone or e-mail (listed below).   "CKI" is documented in the H&P. Please consider the below (if your clinical findings/judgment agree) as you document the patient's condition(s) in the progress note and discharge summary. Thank you!   Possible Clinical Conditions?  - CKD Stage I - GFR >90, Cr <0.9  - CKD Stage II - GFR 60-89, Cr 1.0-1.3  - CKD Stage III - GFR 30-59, Cr 1.4-2.5  - Other condition (please document in the progress notes and/or  discharge summary)  - Cannot Clinically determine at this time    Supporting Information:  - Cr= 1.44, 1.24  - GFR (AA)= 51, 42   *You may use possible, probable, or suspect with inpatient documentation. possible, probable, suspected diagnoses MUST be documented at the time of discharge   No additional documentation in chart upon review.   Thank you. If you have any questions, please contact me:  Sincerely, Jerome Email: [email or web address]   THIS DOCUMENT IS NOT A PERMANENT PART OF THE MEDICAL RECORD

## 2011-11-03 NOTE — Clinical Documentation Improvement (Signed)
BMI DOCUMENTATION CLARIFICATION QUERY  THIS DOCUMENT IS NOT A PERMANENT PART OF THE MEDICAL RECORD  Dear Dr. Huey Romans  In an effort to better capture your patient's severity of illness, reflect appropriate length of stay and utilization of resources, a review of the patient medical record has revealed the following indicators.   Based on your clinical judgment, please clarify and document in a progress note and/or discharge summary the clinical condition associated with the following supporting information: In responding to this query please exercise your independent judgment.  The fact that a query is asked, does not imply that any particular answer is desired or expected.  TO RESPOND TO THE THIS QUERY, FOLLOW THE INSTRUCTIONS BELOW:  1. If needed, update documentation for the patient's encounter via the notes activity.  2. Access this query again and click edit on the [employer].  3. After updating, or not, click F2 to complete all highlighted (required) fields concerning your review. Select "additional documentation in the medical record" OR "no additional documentation provided". 4. Click Sign note button.  5. The deficiency will fall out of your InBasket  *Please let us know if you are not able to compete this workflow by phone or e-mail (listed below).   According to the documented Height and Weight in CHL/EPIC, the patients BMI is greater than 40. If your clinical findings/judgment agrees with this, please document this along with the related diagnosis in the progress note and discharge summary. [place] YOU!    BEST PRACTICE: A diagnosis of UNDERWEIGHT or MORBID OBESITY should have the BMI documented along with it.  Possible Clinical Conditions?  - Morbid Obesity W/ BMI= 44.7  - Other condition (please document in the progress notes and/or discharge summary)  - Cannot Clinically determine at this time   Supporting Information:  Weight: 260lbs Height 5'4" BMI=  44.7   No additional documentation in chart upon review.   Thank you. If you have any questions, please contact me:  Sincerely, Hartley Barefoot ,RN  Clinical Documentation [place] Email: [email or web address]

## 2011-11-03 NOTE — Progress Notes (Signed)
Physical Therapy Evaluation Patient Details Name: Kathleen [name] MRN: [id number] DOB: Jan 30, 1946 Today's Date: 11/03/2011  Problem List:  Patient Active Problem List  Diagnoses  . HYPERLIPIDEMIA-MIXED  . OBESITY  . ANEMIA, SECONDARY TO BLOOD LOSS  . TOBACCO ABUSE  . HYPERTENSION, UNSPECIFIED  . C A D  . ARTHRITIS  . SYNCOPE AND COLLAPSE  . CHOLECYSTECTOMY, HX OF  . Left-sided weakness  . Diabetes mellitus  . Chronic kidney disease    Past Medical History:  Past Medical History  Diagnosis Date  . Hypertension   . Coronary artery disease   . Blood transfusion     no side affects  . GERD (gastroesophageal reflux disease)   . Arthritis   . Diabetes mellitus     Borderline   Past Surgical History:  Past Surgical History  Procedure Date  . Total knee arthroplasty   . Cholecystectomy   . Coronary angioplasty with stent placement     PT Assessment/Plan/Recommendation PT Assessment Clinical Impression Statement: Pt was admitted with dx of left sided weakness and an NIHSS of 1 secondary to numbness left hand. She has premorbid activity limitations secondary to arthritis. Discussed with patient the need for regular exercise. PT Recommendation/Assessment: Patent does not need any further PT services No Skilled PT: All education completed;Patient will have necessary level of assist by caregiver at discharge;Patient is modified independent with all activity/mobility (Educated in warning signs and stroke risk factors)  PT Evaluation Prior Functioning  Home Living Lives With: Alone Receives Help From: Family Type of Home: Apartment Home Layout: One level Home Access: Level entry Home Adaptive Equipment: None Prior Function Level of Independence: Independent with homemaking with ambulation Driving: Yes Cognition Cognition Arousal/Alertness: Awake/alert Overall Cognitive Status: Appears within functional limits for tasks assessed Orientation Level: Oriented  X4 Sensation/Coordination Sensation Light Touch: Appears Intact (lower extremities) Proprioception: Appears Intact Coordination Gross Motor Movements are Fluid and Coordinated: Yes Extremity Assessment RLE Assessment RLE Assessment: Within Functional Limits LLE Assessment LLE Assessment: Within Functional Limits Mobility (including Balance) Bed Mobility Bed Mobility: Yes Rolling Right: 6: Modified independent (Device/Increase time) Right Sidelying to Sit: 6: Modified independent (Device/Increase time) Sitting - Scoot to Edge of Bed: 6: Modified independent (Device/Increase time) Sit to Supine - Right: 6: Modified independent (Device/Increase time) Transfers Transfers: Yes Sit to Stand: 6: Modified independent (Device/Increase time);From toilet;From bed;With upper extremity assist Stand to Sit: 6: Modified independent (Device/Increase time);To toilet;To bed;With upper extremity assist Ambulation/Gait Ambulation/Gait: Yes Ambulation/Gait Assistance: 6: Modified independent (Device/Increase time) Ambulation Distance (Feet): 220 Feet Assistive device: None Gait Pattern:  (Waddling gait) Gait velocity: Decreased - greater than speed indicative of fall risk (1.9feet/second), but minimal ability to accelerate  Balance Balance Assessed: Yes Dynamic Sitting Balance Dynamic Sitting - Balance Support: No upper extremity supported Dynamic Sitting - Level of Assistance: 7: Independent Reach (Patient is able to reach ___ inches to right, left, forward, back): 6 Dynamic Sitting - Balance Activities: Reaching for objects;Reaching across midline Dynamic Standing Balance Dynamic Standing - Balance Support: No upper extremity supported Dynamic Standing - Level of Assistance: 6: Modified independent (Device/Increase time) Dynamic Standing - Balance Activities: Reaching for objects (picked item off floor) Dynamic Standing - Comments: 180 degree turns 4 steps, 360 degrees 7 steps - not indicative  of fall risk End of Session PT - End of Session Activity Tolerance: Patient tolerated treatment well Patient left: in bed;with family/visitor present;with call bell in reach General Behavior During Session: [facility] for tasks performed Cognition: [facility] for tasks  performed  Jake Bathe, PT  Pager [phone]  11/03/2011, 9:09 AM

## 2011-11-03 NOTE — Progress Notes (Signed)
Subjective: Ms. Ratner states that she is feeling much better today. She reports almost complete resolution of her symptoms. She states she is only feeling a slight amount of numbness in the fingers of her left hand. No leg numbness. Denies weakness. Denies headache, nausea or vision or slurred speech. Is adamant that she did not go to sleep when she first noted tingling, but called her daughter and then made her way to the emergency room.  Objective: Vital signs in last 24 hours: Filed Vitals:   11/03/11 0230 11/03/11 0600 11/03/11 1009 11/03/11 1445  BP: 186/84 167/76 145/70 199/71  Pulse:  67 60 62  Temp:  97.8 F (36.6 C) 98.1 F (36.7 C) 97.7 F (36.5 C)  TempSrc:   Oral Oral  Resp:  18 18 18   Height:      Weight:      SpO2:  92% 97% 98%   Weight change:   Intake/Output Summary (Last 24 hours) at 11/03/11 1623 Last data filed at 11/03/11 0500  Gross per 24 hour  Intake    680 ml  Output      0 ml  Net    680 ml   Physical Exam:  General: resting in bed, with many family members present Cardiac: RRR, no rubs, murmurs or gallops Pulm: clear to auscultation bilaterally, moving normal volumes of air Abd: soft, nontender, nondistended, BS present Ext: warm and well perfused, no pedal edema.  Neuro: alert and oriented X3, cranial nerves II-XII grossly intact, strength and sensation to light touch equal in bilateral upper and lower extremities  Lab Results: Basic Metabolic Panel:  Lab [id number] 1223 11/02/11 0622  NA -- 140  K -- 4.0  CL -- 104  CO2 -- 26  GLUCOSE -- 90  BUN -- 35*  CREATININE 1.24* 1.44*  CALCIUM -- 9.7  MG -- --  PHOS -- --   Liver Function Tests:  Lab 11/02/11 0622  AST 16  ALT 17  ALKPHOS 86  BILITOT 0.3  PROT 7.9  ALBUMIN 3.4*   CBC:  Lab 11/02/11 1223 11/02/11 0622  WBC 9.9 10.2  NEUTROABS -- 6.0  HGB 13.1 12.9  HCT 39.4 39.5  MCV 85.5 85.9  PLT 212 211   Cardiac Enzymes:  Lab 11/03/11 0333 11/02/11 1918 11/02/11 1220    CKTOTAL 108 117 125  CKMB 2.2 2.6 2.9  CKMBINDEX -- -- --  TROPONINI <0.30 <0.30 <0.30   CBG:  Lab 11/03/11 1140 11/03/11 0451 11/02/11 2346 11/02/11 2012 11/02/11 1649 11/02/11 1122  GLUCAP 106* 72 199* 134* 121* 80   Hemoglobin A1C:  Lab 11/02/11 1223  HGBA1C 6.3*   Fasting Lipid Panel:  Lab 11/03/11 0333  CHOL 224*  HDL 58  LDLCALC 155*  TRIG 53  CHOLHDL 3.9  LDLDIRECT --   Coagulation:  Lab 11/02/11 0622  LABPROT 13.4  INR 1.00   Studies/Results: Ct Head Wo Contrast  11/02/2011  *RADIOLOGY REPORT*  Clinical Data: Left-sided weakness.  CT HEAD WITHOUT CONTRAST  Technique:  Contiguous axial images were obtained from the base of the skull through the vertex without contrast.  Comparison: 10/07/2008  Findings: The brain stem, cerebellum, cerebral peduncles, thalami, basal ganglia, basilar cisterns, and ventricular system appear unremarkable.  No intracranial hemorrhage, mass lesion, or acute infarction is identified.  Chronic left sphenoid sinusitis noted.  IMPRESSION:  1.  Chronic left sphenoid sinusitis.   Otherwise, no significant abnormality identified.  Original Report Authenticated By: Carron Curie, M.D.   Mri  Brain Without Contrast  11/03/2011  *RADIOLOGY REPORT*  Clinical Data: 65 year old female with left-sided weakness and numbness.  MRI HEAD WITHOUT CONTRAST  Technique:  Multiplanar, multiecho pulse sequences of the brain and surrounding structures were obtained according to standard protocol without intravenous contrast.  Comparison: Head CTs 11/02/2011 and earlier.  Findings: On diffusion weighted images there is mildly heterogeneous diffusion in the left paracentral pons (series 4 image 40), but this is above the level of the pyramidal decussation and would be expected to cause right-sided symptoms, therefore I suspect this is artifact.  No restricted diffusion to suggest acute infarction.  No midline shift, mass effect, evidence of mass lesion,  ventriculomegaly, extra-axial collection or acute intracranial hemorrhage. Cervicomedullary junction and pituitary are within normal limits. Major intracranial vascular flow voids are preserved. Pearline Cables and white matter signal is within normal limits for age throughout the brain.  Negative visualized cervical spine.  Visualized bone marrow signal is within normal limits.  Hypoplastic partially opacified sphenoid sinuses.  The maxillary sinuses and frontal sinuses also are hypoplastic.  The mastoids are clear. Visualized orbit soft tissues are within normal limits.  Visualized scalp soft tissues are within normal limits.  IMPRESSION: 1. No acute intracranial abnormality.  Negative for age noncontrast MRI appearance the brain. 2.  Chronic sphenoid sinus disease.  Original Report Authenticated By: Randall An, M.D.     Medications: I have reviewed the patient's current medications. Scheduled Meds:   . chlorthalidone  25 mg Oral Daily  . clopidogrel  75 mg Oral Q breakfast  . enoxaparin  30 mg Subcutaneous Q12H  . famotidine  20 mg Oral BID  . glipiZIDE  5 mg Oral BID AC  . insulin aspart  0-9 Units Subcutaneous TID WC  . lisinopril  20 mg Oral Daily  . simvastatin  10 mg Oral q1800  . sodium chloride  3 mL Intravenous Q12H  . DISCONTD: insulin aspart  0-15 Units Subcutaneous TID WC  . DISCONTD: insulin aspart  0-9 Units Subcutaneous Q4H   Continuous Infusions:  PRN Meds:.acetaminophen Assessment/Plan: 1) Left upper and lower extremity weakness:  Patient presents with acute onset of left upper and lower extremity paresthesia and weakness.   She has numerous risk factors for CVA/TIA and it is likely her current symptoms represent an acute stroke given the persistence of her symptoms.  Myocardial infarction ruled out with negative cardiac enzymes x4. EKG negative for acute ischemia. MRI was negative for stroke. Most likely etiology is TIA though the persistence of symptoms is atypical. She is  resolving symptoms and now only has mild numbness in her left hand. Her other Dopplers pending as possible embolic source. -- Will admit to telemetry -- Carotid Dopplers pending  2) DM: A1c of 6.3 he shows good blood glucose control on oral antihypoglycemic. We will continue this with sliding scale insulin coverage.  3) HTN: Now that MRI has ruled out stroke we will aggressively lower her blood pressure. Will restart HCTZ and chlorthalidone. Will hold amlodipine as would like to initiate beta blocker for post MI cardio protection. -- Lisinopril 20 daily -- Chlorthalidone 25 daily -- Continue to monitor    4) CAD: this appears stable.  Repeat EKG this morning is also without evidence of ischemia or other concerning process and is unchanged from prior EKGs. Cardiac enzymes were negative x4.  -- Clopidogrel -- Continue ACE inhibitor -- Will start statin -- Will add beta blocker if blood pressure sports tomorrow  5) CKI: Cr is  currently at baseline of 1.3-1.4. - Follow with BMP in a.m.  6) Tobacco use: pt declines nicotine patch at this time. I counseled the patient extensively regarding risk factor reduction for stroke and heart attack. Told her that quitting smoking will absolutely decrease her risk for stroke, MI, or repeat TIA - Will obtain smoking cessation consult  7) DVT prophylaxis :  lovenox    LOS: 1 day   [place], Lonn Im 11/03/2011, 4:23 PM

## 2011-11-04 LAB — CBC
HCT: 38 % (ref 36.0–46.0)
MCH: 28.2 pg (ref 26.0–34.0)
MCV: 85.6 fL (ref 78.0–100.0)
RBC: 4.44 MIL/uL (ref 3.87–5.11)
WBC: 8.3 10*3/uL (ref 4.0–10.5)

## 2011-11-04 LAB — BASIC METABOLIC PANEL
BUN: 28 mg/dL — ABNORMAL HIGH (ref 6–23)
CO2: 26 mEq/L (ref 19–32)
Calcium: 9.7 mg/dL (ref 8.4–10.5)
Chloride: 103 mEq/L (ref 96–112)
Creatinine, Ser: 1.3 mg/dL — ABNORMAL HIGH (ref 0.50–1.10)

## 2011-11-04 LAB — GLUCOSE, CAPILLARY: Glucose-Capillary: 91 mg/dL (ref 70–99)

## 2011-11-04 MED ORDER — CLOPIDOGREL BISULFATE 75 MG PO TABS: 75.0000 mg | ORAL_TABLET | Freq: Every day | ORAL | Status: AC

## 2011-11-04 MED ORDER — METOPROLOL TARTRATE 12.5 MG HALF TABLET: 12.5000 mg | ORAL_TABLET | Freq: Two times a day (BID) | ORAL | Status: DC

## 2011-11-04 MED ORDER — METOPROLOL TARTRATE 12.5 MG HALF TABLET
12.5000 mg | ORAL_TABLET | Freq: Two times a day (BID) | ORAL | Status: DC
Start: 2011-11-04 — End: 2011-11-04
  Administered 2011-11-04: 12.5 mg via ORAL
  Filled 2011-11-04 (×2): qty 1

## 2011-11-04 MED ORDER — PRAVASTATIN SODIUM 40 MG PO TABS: 40.0000 mg | ORAL_TABLET | Freq: Every day | ORAL | Status: DC

## 2011-11-04 NOTE — Progress Notes (Signed)
Subjective:  No events overnight states hand numbness is resolved. No leg numbness x2 days. States left leg still feels a little bit weaker while walking.. Denies headache, nausea or vision or slurred speech. Denies chest pain shortness of breath, nausea vomiting constipation or diarrhea.  Objective: Vital signs in last 24 hours: Filed Vitals:   11/03/11 1445 11/03/11 1739 11/03/11 2200 11/04/11 0200  BP: 199/71 184/77 160/71 152/71  Pulse: 62 62 88 70  Temp: 97.7 F (36.5 C) 98.6 F (37 C) 98.5 F (36.9 C) 98.9 F (37.2 C)  TempSrc: Oral Oral    Resp: 18 18 20 18   Height:      Weight:      SpO2: 98% 96% 94% 97%   Weight change:  No intake or output data in the 24 hours ending 11/04/11 0725 Physical Exam:  General: Lying in bed, NAD Cardiac: RRR, no rubs, murmurs or gallops Pulm: clear to auscultation bilaterally, moving normal volumes of air Ext: warm and well perfused, no pedal edema.  Neuro: alert and oriented X3, cranial nerves II-XII grossly intact, strength and sensation to light touch equal in bilateral upper and lower extremities  Lab Results: Basic Metabolic Panel:  Lab [id number] 0600 11/02/11 1223 11/02/11 0622  NA 139 -- 140  K 3.9 -- 4.0  CL 103 -- 104  CO2 26 -- 26  GLUCOSE 81 -- 90  BUN 28* -- 35*  CREATININE 1.30* 1.24* --  CALCIUM 9.7 -- 9.7  MG -- -- --  PHOS -- -- --   Liver Function Tests:  Lab 11/02/11 0622  AST 16  ALT 17  ALKPHOS 86  BILITOT 0.3  PROT 7.9  ALBUMIN 3.4*   CBC:  Lab 11/04/11 0600 11/02/11 1223 11/02/11 0622  WBC 8.3 9.9 --  NEUTROABS -- -- 6.0  HGB 12.5 13.1 --  HCT 38.0 39.4 --  MCV 85.6 85.5 --  PLT 202 212 --   Cardiac Enzymes:  Lab 11/03/11 0333 11/02/11 1918 11/02/11 1220  CKTOTAL 108 117 125  CKMB 2.2 2.6 2.9  CKMBINDEX -- -- --  TROPONINI <0.30 <0.30 <0.30   CBG:  Lab 11/04/11 0608 11/03/11 2145 11/03/11 1643 11/03/11 1140 11/03/11 0451 11/02/11 2346  GLUCAP 91 87 84 106* 72 199*   Hemoglobin  A1C:  Lab 11/02/11 1223  HGBA1C 6.3*   Fasting Lipid Panel:  Lab 11/03/11 0333  CHOL 224*  HDL 58  LDLCALC 155*  TRIG 53  CHOLHDL 3.9  LDLDIRECT --   Coagulation:  Lab 11/02/11 0622  LABPROT 13.4  INR 1.00   Studies/Results: Mri Brain Without Contrast  11/03/2011  *RADIOLOGY REPORT*  Clinical Data: 65 year old female with left-sided weakness and numbness.  MRI HEAD WITHOUT CONTRAST  Technique:  Multiplanar, multiecho pulse sequences of the brain and surrounding structures were obtained according to standard protocol without intravenous contrast.  Comparison: Head CTs 11/02/2011 and earlier.  Findings: On diffusion weighted images there is mildly heterogeneous diffusion in the left paracentral pons (series 4 image 40), but this is above the level of the pyramidal decussation and would be expected to cause right-sided symptoms, therefore I suspect this is artifact.  No restricted diffusion to suggest acute infarction.  No midline shift, mass effect, evidence of mass lesion, ventriculomegaly, extra-axial collection or acute intracranial hemorrhage. Cervicomedullary junction and pituitary are within normal limits. Major intracranial vascular flow voids are preserved. Pearline Cables and white matter signal is within normal limits for age throughout the brain.  Negative visualized cervical spine.  Visualized bone marrow signal is within normal limits.  Hypoplastic partially opacified sphenoid sinuses.  The maxillary sinuses and frontal sinuses also are hypoplastic.  The mastoids are clear. Visualized orbit soft tissues are within normal limits.  Visualized scalp soft tissues are within normal limits.  IMPRESSION: 1. No acute intracranial abnormality.  Negative for age noncontrast MRI appearance the brain. 2.  Chronic sphenoid sinus disease.  Original Report Authenticated By: Randall An, M.D.    Carotid Doppler shows a 40 to59% left ICA stenosis. Vertebrals and right ICA are patent.   Medications: I  have reviewed the patient's current medications. Scheduled Meds:    . chlorthalidone  25 mg Oral Daily  . clopidogrel  75 mg Oral Q breakfast  . enoxaparin  30 mg Subcutaneous Q12H  . famotidine  20 mg Oral BID  . glipiZIDE  5 mg Oral BID AC  . insulin aspart  0-9 Units Subcutaneous TID WC  . lisinopril  20 mg Oral Daily  . metoprolol tartrate  12.5 mg Oral BID  . simvastatin  10 mg Oral q1800  . sodium chloride  3 mL Intravenous Q12H  . DISCONTD: insulin aspart  0-15 Units Subcutaneous TID WC  . DISCONTD: insulin aspart  0-9 Units Subcutaneous Q4H   Continuous Infusions:  PRN Meds:.acetaminophen Assessment/Plan: 1) Left upper and lower extremity weakness:  Patient presents with acute onset of left upper and lower extremity paresthesia and weakness.   She has numerous risk factors for CVA/TIA.  Current episode is likely secondary to TIA given negative MRI. Another possibility could be nerve compression however it is unlikely because it would have to affect both the upper and lower extremities the distribution of which would not correspond to peripheral nerves injury. Myocardial infarction ruled out with negative cardiac enzymes x4. EKG negative for acute ischemia. She is resolving symptoms and now only has intermittent numbness in her left hand. Carotid Dopplers showed 40 to59% left ICA stenosis, however this is inconsistent with her left-sided symptoms. Most likely this is asymptomatic carotid artery stenosis which does not meet criteria for carotid endarterectomy. -- We'll discharge home today with aggressive risk factor modification. Hypertension, hyperlipidemia, blood pressure, diabetes, smoking, physical and activity are all weighing against Ms. Hai. I asked her to make a concerted effort to discontinue smoking and engage in physical activity.   2) DM: A1c of 6.3 he shows good blood glucose control on oral antihypoglycemic. We will continue this with sliding scale insulin  coverage.  3) HTN: Now that MRI has ruled out stroke we will aggressively lower her blood pressure. Will restart HCTZ and chlorthalidone. Will hold amlodipine as would like to initiate beta blocker for post MI cardio protection. Initiation of beta blocker in addition to chlorthalidone and lisinopril has yielded very nice results with blood pressures in the 120s. -- Lisinopril 20 daily -- Chlorthalidone 25 daily -- Metoprolol 12.5 mg twice daily  4) CAD: this appears stable.  Repeat EKG this morning is also without evidence of ischemia or other concerning process and is unchanged from prior EKGs. Cardiac enzymes were negative x4.  -- Clopidogrel -- Continue ACE inhibitor -- Statin started -- Beta blocker added today.  5) CKI: Cr is currently at baseline of 1.3-1.4.  6) Tobacco use: I counseled the patient extensively regarding risk factor reduction for stroke and heart attack. Told her that quitting smoking will absolutely decrease her risk for stroke, MI, or repeat TIA. She states she would make a concerted effort to discontinue  smoking   7) DVT prophylaxis :  lovenox    LOS: 2 days   [place], Samauri Kellenberger 11/04/2011, 7:25 AM

## 2011-11-04 NOTE — Progress Notes (Signed)
Sl removed from L AC, no bleeding noted, catheter tip intact.  Tel removed.  Pt and daughter given d/c instructions along with f/u apt if needed and med rec.  Prescriptions sent to pt's pharmacy.  Pt belongings sent with pt. Pt d/c'd home via w/c accompanied by medical staff and family.

## 2011-11-04 NOTE — Discharge Summary (Signed)
[facility] Discharge Note  Name: Kathleen [name] MRN: [id number] DOB: Jun 18, 1946 65 y.o.  Date of Admission: 11/02/2011  5:41 AM Date of Discharge: 11/04/2011 Attending Physician: Larey Dresser  Discharge Diagnosis: Principal Problem:  *Left-sided weakness Active Problems:  HYPERLIPIDEMIA-MIXED  TOBACCO ABUSE  HYPERTENSION, UNSPECIFIED  C A D  Diabetes mellitus  Chronic kidney disease   Discharge Medications: Current Discharge Medication List    START taking these medications   Details  clopidogrel (PLAVIX) 75 MG tablet Take 1 tablet (75 mg total) by mouth daily with breakfast. Qty: 30 tablet, Refills: 3    metoprolol tartrate (LOPRESSOR) 12.5 mg TABS Take 0.5 tablets (12.5 mg total) by mouth 2 (two) times daily. Qty: 30 tablet, Refills: 3    pravastatin (PRAVACHOL) 40 MG tablet Take 1 tablet (40 mg total) by mouth daily. Qty: 30 tablet, Refills: 3      CONTINUE these medications which have NOT CHANGED   Details  aspirin EC 81 MG tablet Take 81 mg by mouth daily.      chlorthalidone (HYGROTON) 25 MG tablet Take 12.5 mg by mouth at bedtime.      famotidine (PEPCID) 20 MG tablet Take 20 mg by mouth 2 (two) times daily.      glipiZIDE (GLUCOTROL) 5 MG tablet Take 5 mg by mouth 2 (two) times daily before a meal.      lisinopril (PRINIVIL,ZESTRIL) 20 MG tablet Take 20 mg by mouth daily.        STOP taking these medications     amLODipine (NORVASC) 2.5 MG tablet         Disposition and follow-up:   [name] was discharged from [facility] in Stable condition.    Follow-up Appointments:  she is to make a followup appointment with her primary care doctor Dr. Redmond Pulling @ [facility] as soon as is possible.   She will have an acute visit X1 in the [facility] strictly for hospital follow-up. At that time please address the following issues: #1 please assess for resolution of  numbness and weakness in the left hand and foot #2 patient was started on a statin please inquire about side effects and compliance  #3 the patient was discontinued on amlodipine and started on metoprolol for cardioprotection after previous MI. Please titrate blood pressure regimen as necessary. #4 please insure the patient follows up with her regular primary care Dr. at [facility].  Discharge Orders    Future Appointments: Provider: Department: Dept Phone: Center:   11/23/2011 12:00 PM Minus Breeding, [place] [phone] [id number]     Future Orders Please Complete By Expires   Diet - low sodium heart healthy      Increase activity slowly      Discharge instructions      Comments:   Is imperative that you discontinue smoking and increase physical activity. Try to get at least 30 minutes of walking daily.   Call MD for:  persistant nausea and vomiting      Call MD for:  severe uncontrolled pain      Call MD for:  difficulty breathing, headache or visual disturbances      Call MD for:  persistant dizziness or light-headedness         Consultations: Treatment Team:  Eduardo Osier  Procedures Performed:  Ct Head Wo Contrast  11/02/2011  *RADIOLOGY REPORT*  Clinical Data: Left-sided weakness.  CT HEAD WITHOUT CONTRAST  Technique:  Contiguous axial images were obtained from the base of the skull through the vertex without contrast.  Comparison: 10/07/2008  Findings: The brain stem, cerebellum, cerebral peduncles, thalami, basal ganglia, basilar cisterns, and ventricular system appear unremarkable.  No intracranial hemorrhage, mass lesion, or acute infarction is identified.  Chronic left sphenoid sinusitis noted.  IMPRESSION:  1.  Chronic left sphenoid sinusitis.   Otherwise, no significant abnormality identified.  Original Report Authenticated By: Carron Curie, M.D.   Mri Brain Without Contrast  11/03/2011  *RADIOLOGY REPORT*  Clinical Data: 65 year old female  with left-sided weakness and numbness.  MRI HEAD WITHOUT CONTRAST  Technique:  Multiplanar, multiecho pulse sequences of the brain and surrounding structures were obtained according to standard protocol without intravenous contrast.  Comparison: Head CTs 11/02/2011 and earlier.  Findings: On diffusion weighted images there is mildly heterogeneous diffusion in the left paracentral pons (series 4 image 40), but this is above the level of the pyramidal decussation and would be expected to cause right-sided symptoms, therefore I suspect this is artifact.  No restricted diffusion to suggest acute infarction.  No midline shift, mass effect, evidence of mass lesion, ventriculomegaly, extra-axial collection or acute intracranial hemorrhage. Cervicomedullary junction and pituitary are within normal limits. Major intracranial vascular flow voids are preserved. Pearline Cables and white matter signal is within normal limits for age throughout the brain.  Negative visualized cervical spine.  Visualized bone marrow signal is within normal limits.  Hypoplastic partially opacified sphenoid sinuses.  The maxillary sinuses and frontal sinuses also are hypoplastic.  The mastoids are clear. Visualized orbit soft tissues are within normal limits.  Visualized scalp soft tissues are within normal limits.  IMPRESSION: 1. No acute intracranial abnormality.  Negative for age noncontrast MRI appearance the brain. 2.  Chronic sphenoid sinus disease.  Original Report Authenticated By: Randall An, M.D.   Carotid Dopplers 01/01/2011 No right ICA stenosis. 40 to59% left ICA stenosis.Mild mix plaque noted throughout bilateral. Vertebrals are patent with antegrade flow.  Admission HPI:  [name] is a 65 year old female with past medical history significant for diabetes, hypertension, and CAD status post RCA stenting who presents with acute onset of left upper and left lower extremity tingling and weakness. She states she awoke at 4 AM on the day of  admission with a severe tingling/burning sensation in her left upper and left lower extremity associated with some weakness. She took her blood pressure upon awakening and noted that it was significantly elevated with a systolic reading of [id number]. She often checks her pressure at home and notes her systolic blood pressure typically ranges between 140 to 150. She denies any slurred speech, difficulty walking, chest pain, headache, visual changes, or syncope. She reports experiencing left upper extremity tingling when she was admitted and stented for her coronary artery disease but states her current symptoms are very different in nature. She feels her symptoms are slightly improved from this morning but she continues to have mild tingling/numbness and slight weakness in her left upper and left lower extremity. She admits to feeling slightly lightheaded this morning but denies syncope or loss of consciousness. She denies nausea, vomiting, diarrhea, abdominal pain, fever, chills, sweats, bright red blood per rectum, dark tarry stools, dysuria, shortness of breath, dyspnea on exertion, or other associated symptom/complaint. She reports 100% compliance with daily aspirin and notes she occasionally takes 2 baby aspirin a day. She states her diabetes is well controlled and her blood sugars have been within normal limits over the past few  weeks.  Admission physical exam Blood pressure 180/58, pulse 79, temperature 97.8 F (36.6 C), temperature source Oral, resp. rate 20, SpO2 95.00%. GEN: No apparent distress.  Alert and oriented x 3.  Pleasant, conversant, and cooperative to exam. HEENT: head is autraumatic and normocephalic.  Neck is supple without palpable masses or lymphadenopathy.  No JVD or carotid bruits.  Vision intact.  EOMI.  PERRLA.  Sclerae anicteric.  Conjunctivae without pallor or injection. Mucous membranes are moist.  Oropharynx is without erythema, exudates, or other abnormal lesions.    RESP:  Lungs  are clear to ascultation bilaterally with good air movement.  No wheezes, ronchi, or rubs. CARDIOVASCULAR: regular rate, normal rhythm.  Clear S1, S2, no murmurs, gallops, or rubs. ABDOMEN: soft, non-tender, non-distended.  Bowels sounds present in all quadrants and slightly hypoactive.  No palpable masses. EXT: warm and dry.  Peripheral pulses equal, intact, and +2 globally.  No clubbing or cyanosis.  No  edema in bilateral lower extremities. SKIN: warm and dry with normal turgor.  No rashes or abnormal lesions observed. NEURO: CN II-XII grossly intact.  Muscle strength +5/5 in right upper and lower extremity. +4 grip and muscle strength in left upper extremity, +4 muscle strength in left lower extremity. Sensation decreased along lateral aspect of left upper extremity (C5-C8 distribution) as well as medial aspect (L4, L5 distribution) of left lower extremity.  Rapid alternating movements and finger-to-nose intact.   Admission Lab results: Basic Metabolic Panel: [facility]  11/02/11 0622   NA  140   K  4.0   CL  104   CO2  26   GLUCOSE  90   BUN  35*   CREATININE  1.44*   CALCIUM  9.7    Liver Function Tests: Basename  11/02/11 0622   AST  16   ALT  17   ALKPHOS  86   BILITOT  0.3   PROT  7.9   ALBUMIN  3.4*    CBC: Basename  11/02/11 0622   WBC  10.2   NEUTROABS  6.0   HGB  12.9   HCT  39.5   MCV  85.9   PLT  211    Cardiac Enzymes: Basename  11/02/11 0621   CKTOTAL  131   CKMB  2.8   TROPONINI  <0.30    Coagulation: Basename  11/02/11 0622   LABPROT  13.4   INR  1.00     Lab Results  Component Value Date   ALT 17 11/02/2011   AST 16 11/02/2011   ALKPHOS 86 11/02/2011   BILITOT 0.3 11/02/2011     Lab Results  Component Value Date   CHOL 224* 11/03/2011   HDL 58 11/03/2011   LDLCALC 155* 11/03/2011   TRIG 53 11/03/2011   CHOLHDL 3.9 11/03/2011   Cardiac Enzymes:   Lab  11/03/11 0333  11/02/11 1918  11/02/11 1220   CKTOTAL  108  117  125   CKMB   2.2  2.6  2.9   CKMBINDEX  --  --  --   TROPONINI  <0.30  <0.30  <0.30    Hospital Course by problem list: 1) transient ischemic attack:  Patient presented with acute onset of left upper and lower extremity paresthesia and weakness as well as slurred speech per her daughter.   She has numerous risk factors for CVA/TIA including hypertension, hyperlipidemia, blood pressure, diabetes, smoking, physical inactivity and family history.  Current episode is likely secondary to TIA given  negative MRI. Another possibility could be nerve compression however it is unlikely because it would have to affect both the upper and lower extremities the distribution of which would not correspond to peripheral nerves injury. Myocardial infarction ruled out with negative cardiac enzymes x4. EKG negative for acute ischemia. Carotid Dopplers showed 40 to59% left ICA stenosis, however this is inconsistent with her left-sided motor and sensory symptoms. Most likely this is asymptomatic carotid artery stenosis which does not meet criteria for carotid endarterectomy. Ms. Hafler was counseled extensively with regarding stroke and TIA risk factor reduction including treatment of hypertension, hyperlipidemia, blood pressure, diabetes, quitting smoking, and starting regular physical and activity.  She was started on beta blocker, statin, and clopidogrel during this admission. Ms. Sagel was changed from aspirin to clopidogrel therapy given that she had this event despite being very compliant with aspirin therapy.  2) DM: A1c of 6.3 during this admission shows good blood glucose control on her home dose of oral antihypoglycemic. This was continued for sliding scale insulin coverage.  3) HTN: In the setting of possible acute stroke permissive hypertension was allowed. After MRI revealed no acute stroke blood pressure was aggressively lowered. Lisinopril and chlorthalidone were continued at her home dose. Amlodipine was discontinued and  metoprolol was added for post-MI cardioprotection. This combination of antihypertensives yielded satisfactory control of blood pressure with systolic pressures ranging from the 111s to 140s.  4) CAD: EKGs were negative for ischemia or acute change x 2. Cardiac enzymes were negative x4.  Clopidogrel, lisinopril, pravastatin, and metoprolol were all added or continued at discharge.  5) CKI: Creatinine was maintained at Ms. Klunder's baseline of 1.3-1.4.  6) Tobacco use: I counseled the patient extensively regarding risk factor reduction for stroke and heart attack. Told her that quitting smoking will absolutely decrease her risk for stroke, MI, or repeat TIA.   7) hyperlipidemia. As Ms. Bohl presented with possible stroke/TIA and has has diabetes and prior MI with RCA stenting lipid levels were checked during this admission. Her total cholesterol was 224, HDL was 58, LDL was 155, and triglycerides were 53. Given her history of MI and diabetes her goal LDL should be less than 70. LFTs were checked AST 17, ALT 16, alkaline phosphatase 86, bilirubin total 0.3. Pravastatin was initiated on this admission though she will likely need more aggressive with lipid lowering therapy in the future.  Discharge Vitals:  BP 110/72  Pulse 80  Temp(Src) 97.8 F (36.6 C) (Oral)  Resp 18  Ht 5\' 4"  (1.626 m)  Wt 260 lb (117.935 kg)  BMI 44.63 kg/m2  SpO2 99%  Discharge Labs:  Results for orders placed during the hospital encounter of 11/02/11 (from the past 24 hour(s))  GLUCOSE, CAPILLARY     Status: Normal   Collection Time   11/03/11  4:43 PM      Component Value Range   Glucose-Capillary 84  70 - 99 (mg/dL)  GLUCOSE, CAPILLARY     Status: Normal   Collection Time   11/03/11  9:45 PM      Component Value Range   Glucose-Capillary 87  70 - 99 (mg/dL)  CBC     Status: Abnormal   Collection Time   11/04/11  6:00 AM      Component Value Range   WBC 8.3  4.0 - 10.5 (K/uL)   RBC 4.44  3.87 - 5.11 (MIL/uL)    Hemoglobin 12.5  12.0 - 15.0 (g/dL)   HCT 38.0  36.0 - 46.0 (%)  MCV 85.6  78.0 - 100.0 (fL)   MCH 28.2  26.0 - 34.0 (pg)   MCHC 32.9  30.0 - 36.0 (g/dL)   RDW 15.9 (*) 11.5 - 15.5 (%)   Platelets 202  150 - 400 (K/uL)  BASIC METABOLIC PANEL     Status: Abnormal   Collection Time   11/04/11  6:00 AM      Component Value Range   Sodium 139  135 - 145 (mEq/L)   Potassium 3.9  3.5 - 5.1 (mEq/L)   Chloride 103  96 - 112 (mEq/L)   CO2 26  19 - 32 (mEq/L)   Glucose, Bld 81  70 - 99 (mg/dL)   BUN 28 (*) 6 - 23 (mg/dL)   Creatinine, Ser 1.30 (*) 0.50 - 1.10 (mg/dL)   Calcium 9.7  8.4 - 10.5 (mg/dL)   GFR calc non Af Amer 41 (*) >90 (mL/min)   GFR calc Af Amer 48 (*) >90 (mL/min)  GLUCOSE, CAPILLARY     Status: Normal   Collection Time   11/04/11  6:08 AM      Component Value Range   Glucose-Capillary 91  70 - 99 (mg/dL)  GLUCOSE, CAPILLARY     Status: Abnormal   Collection Time   11/04/11 11:23 AM      Component Value Range   Glucose-Capillary 125 (*) 70 - 99 (mg/dL)    Signed: [place], Ellon Marasco 11/04/2011, 11:59 AM

## 2011-11-22 ENCOUNTER — Encounter: Payer: Self-pay | Admitting: Internal Medicine

## 2011-11-22 ENCOUNTER — Other Ambulatory Visit: Payer: Self-pay | Admitting: Internal Medicine

## 2011-11-22 ENCOUNTER — Ambulatory Visit (INDEPENDENT_AMBULATORY_CARE_PROVIDER_SITE_OTHER): Payer: Medicare Other | Admitting: Internal Medicine

## 2011-11-22 DIAGNOSIS — G5600 Carpal tunnel syndrome, unspecified upper limb: Secondary | ICD-10-CM

## 2011-11-22 DIAGNOSIS — E785 Hyperlipidemia, unspecified: Secondary | ICD-10-CM

## 2011-11-22 DIAGNOSIS — R55 Syncope and collapse: Secondary | ICD-10-CM

## 2011-11-22 DIAGNOSIS — Z23 Encounter for immunization: Secondary | ICD-10-CM

## 2011-11-22 DIAGNOSIS — E119 Type 2 diabetes mellitus without complications: Secondary | ICD-10-CM

## 2011-11-22 DIAGNOSIS — G5602 Carpal tunnel syndrome, left upper limb: Secondary | ICD-10-CM

## 2011-11-22 LAB — GLUCOSE, CAPILLARY: Glucose-Capillary: 126 mg/dL — ABNORMAL HIGH (ref 70–99)

## 2011-11-22 NOTE — Patient Instructions (Signed)
1.  Continue your medication as prescribed  2.  If BP elevated at your follow up visit please talk to them about adjusting your medication.  3.  Talk to your PCP about rechecking your cholesterol at your follow up in January.  4.  If you have any problems please call your PCP or you can call the clinic until then.

## 2011-11-22 NOTE — Progress Notes (Signed)
Subjective:   Patient ID: Kathleen [name] female   DOB: 11-29-1946 65 y.o.   MRN: [id number]  HPI: [name] is a 65 y.o. woman who presents to clinic today to follow up from her hospitalization.  She was hospitalized from 11/02/11-11/04/11 because of TIA like symptoms.  She states that since she left the hospital she has done well.  She states she is taking her medications and is feeling well.  She states that she does have some mild residual left hand numbness but states that the upper arm and leg symptoms have resolved.  She denies any weakness in the left hand but states that it will have tingling in the median nerve distribution occasionally during the day as well as at night.    She did not bring her meter with her today to review her blood sugars.  She is currently on glipizide as her only anti-diabetic medication.  She denies polyuria, polydipsia, or any symptoms of low blood sugars.    Past Medical History  Diagnosis Date  . Hypertension   . Coronary artery disease   . Blood transfusion     no side affects  . GERD (gastroesophageal reflux disease)   . Arthritis   . Diabetes mellitus     Borderline   Current Outpatient Prescriptions  Medication Sig Dispense Refill  . aspirin EC 81 MG tablet Take 81 mg by mouth daily.        . chlorthalidone (HYGROTON) 25 MG tablet Take 12.5 mg by mouth at bedtime.        . clopidogrel (PLAVIX) 75 MG tablet Take 1 tablet (75 mg total) by mouth daily with breakfast.  30 tablet  3  . famotidine (PEPCID) 20 MG tablet Take 20 mg by mouth 2 (two) times daily.        Marland Kitchen glipiZIDE (GLUCOTROL) 5 MG tablet Take 5 mg by mouth 2 (two) times daily before a meal.        . lisinopril (PRINIVIL,ZESTRIL) 20 MG tablet Take 20 mg by mouth daily.        . metoprolol tartrate (LOPRESSOR) 12.5 mg TABS Take 0.5 tablets (12.5 mg total) by mouth 2 (two) times daily.  30 tablet  3  . pravastatin (PRAVACHOL) 40 MG tablet Take 1 tablet (40 mg total) by mouth  daily.  30 tablet  3   No family history on file. History   Social History  . Marital Status: Divorced    Spouse Name: N/A    Number of Children: N/A  . Years of Education: N/A   Social History Main Topics  . Smoking status: Current Everyday Smoker -- 0.3 packs/day for 30 years    Types: Cigarettes  . Smokeless tobacco: Never Used   Comment: Smoking cessation requested   . Alcohol Use: No  . Drug Use: No  . Sexually Active: None   Other Topics Concern  . None   Social History Narrative  . None   Review of Systems: Constitutional: Denies fever, chills, diaphoresis, appetite change and fatigue.  HEENT: Denies photophobia, eye pain, redness, hearing loss, ear pain, congestion, sore throat, rhinorrhea, sneezing, mouth sores, trouble swallowing, neck pain, neck stiffness and tinnitus.   Respiratory: Denies SOB, DOE, cough, chest tightness,  and wheezing.   Cardiovascular: Denies chest pain, palpitations and leg swelling.  Gastrointestinal: Denies nausea, vomiting, abdominal pain, diarrhea, constipation, blood in stool and abdominal distention.  Genitourinary: Denies dysuria, urgency, frequency, hematuria, flank pain and difficulty urinating.  Musculoskeletal:  Denies myalgias, back pain, joint swelling, arthralgias and gait problem.  Skin: Denies pallor, rash and wound.  Neurological: Positive for left hand numbness.  Denies dizziness, seizures, syncope, weakness, light-headedness, and headaches.  Hematological: Denies adenopathy. Easy bruising, personal or family bleeding history  Psychiatric/Behavioral: Denies suicidal ideation, mood changes, confusion, nervousness, sleep disturbance and agitation  Objective:  Physical Exam: Filed Vitals:   11/22/11 1613  BP: 149/66  Pulse: 77  Temp: 97.9 F (36.6 C)  TempSrc: Oral  Resp: 20  Height: 5' 3.5" (1.613 m)  Weight: 264 lb 3.2 oz (119.84 kg)  SpO2: 98%   Constitutional: Vital signs reviewed.  Patient is a well-developed and  well-nourished woman in no acute distress and cooperative with exam. Alert and oriented x3.  Head: Normocephalic and atraumatic Ear: TM normal bilaterally Mouth: no erythema or exudates, MMM Eyes: PERRL, EOMI, conjunctivae normal, No scleral icterus.  Neck: Supple, Trachea midline normal ROM, No JVD, mass, thyromegaly, or carotid bruit present.  Cardiovascular: RRR, S1 normal, S2 normal, no MRG, pulses symmetric and intact bilaterally Pulmonary/Chest: CTAB, no wheezes, rales, or rhonchi Abdominal: Soft. Non-tender, non-distended, bowel sounds are normal, no masses, organomegaly, or guarding present.  GU: no CVA tenderness Musculoskeletal: No joint deformities, erythema, or stiffness, ROM full and no nontender Hematology: no cervical, inginal, or axillary adenopathy.  Neurological: A&O x3, Strength is normal and symmetric bilaterally, median nerve compression test is positive on the left.  Positive Tennel's sign on the left as well.  cranial nerve II-XII are grossly intact, no focal motor deficit, sensory intact to light touch bilaterally.  Skin: Warm, dry and intact. No rash, cyanosis, or clubbing.  Psychiatric: Normal mood and affect. speech and behavior is normal. Judgment and thought content normal. Cognition and memory are normal.   Assessment & Plan:

## 2011-11-23 ENCOUNTER — Ambulatory Visit (INDEPENDENT_AMBULATORY_CARE_PROVIDER_SITE_OTHER): Payer: Medicare Other | Admitting: Cardiology

## 2011-11-23 ENCOUNTER — Encounter: Payer: Self-pay | Admitting: Cardiology

## 2011-11-23 DIAGNOSIS — I2510 Atherosclerotic heart disease of native coronary artery without angina pectoris: Secondary | ICD-10-CM

## 2011-11-23 DIAGNOSIS — I10 Essential (primary) hypertension: Secondary | ICD-10-CM

## 2011-11-23 DIAGNOSIS — F17200 Nicotine dependence, unspecified, uncomplicated: Secondary | ICD-10-CM

## 2011-11-23 DIAGNOSIS — E669 Obesity, unspecified: Secondary | ICD-10-CM

## 2011-11-23 DIAGNOSIS — E785 Hyperlipidemia, unspecified: Secondary | ICD-10-CM

## 2011-11-23 MED ORDER — LISINOPRIL 40 MG PO TABS: 40.0000 mg | ORAL_TABLET | Freq: Every day | ORAL | Status: DC

## 2011-11-23 MED ORDER — PRAVASTATIN SODIUM 80 MG PO TABS
80.0000 mg | ORAL_TABLET | Freq: Every day | ORAL | Status: DC
Start: 2011-11-23 — End: 2018-06-29

## 2011-11-23 NOTE — Assessment & Plan Note (Signed)
I will increase her pravastatin to 80 mg and check a lipid profile in about 8 weeks.

## 2011-11-23 NOTE — Assessment & Plan Note (Signed)
We discussed the need to stop smoknig  she's been tapering but she will commit to a quit date.  We discussed a specific strategy for tobacco cessation.  (Greater than three minutes discussing tobacco cessation.)

## 2011-11-23 NOTE — Patient Instructions (Addendum)
Please increase your lisinopril to 40 mg a day.  Increase your Pravastatin to 80 mg a day. Continue all other medications as listed  Please return to the lab in 2 weeks for blood work.(bmp)  Please return to the lab in 8 weeks for fasting blood work. (lipid)  Return to follow up with Dr Percival Spanish in January 2013.

## 2011-11-23 NOTE — Assessment & Plan Note (Signed)
The patient understands the need to lose weight with diet and exercise. We have discussed specific strategies for this.  

## 2011-11-23 NOTE — Assessment & Plan Note (Signed)
I would like to do a screening stress test on her eventually. She wouldn't be a walk on a treadmill because of knee problems. Once I have her blood pressure better controlled I plan on perform a Lexiscan Myoview.

## 2011-11-23 NOTE — Progress Notes (Signed)
   HPI Patient presents for followup of her known coronary disease. Since I last saw her she was hospitalized with a probable TIA. I did review these hospital records. I do note that her LDL calculated was 155. Her blood pressure was elevated. She also continues to smoke cigarettes. She denies any chest discomfort, neck or arm discomfort. She doesn't notice any palpitations, presyncope or syncope. She has had no PND or orthopnea. She doesn't exercise but she does do her activities of daily living. With this she denies any of her symptoms that was her previous angina.  Allergies  Allergen Reactions  . Penicillins Other (See Comments)    unknown    Current Outpatient Prescriptions  Medication Sig Dispense Refill  . aspirin EC 81 MG tablet Take 81 mg by mouth daily.        . chlorthalidone (HYGROTON) 25 MG tablet Take 12.5 mg by mouth at bedtime.        . clopidogrel (PLAVIX) 75 MG tablet Take 1 tablet (75 mg total) by mouth daily with breakfast.  30 tablet  3  . famotidine (PEPCID) 20 MG tablet Take 20 mg by mouth 2 (two) times daily.        Marland Kitchen glipiZIDE (GLUCOTROL) 5 MG tablet Take 5 mg by mouth 2 (two) times daily before a meal.        . lisinopril (PRINIVIL,ZESTRIL) 20 MG tablet Take 20 mg by mouth daily.        . metoprolol tartrate (LOPRESSOR) 25 MG tablet Take 12.5 mg by mouth 2 (two) times daily.        . pravastatin (PRAVACHOL) 40 MG tablet Take 1 tablet (40 mg total) by mouth daily.  30 tablet  3    Past Medical History  Diagnosis Date  . Hypertension   . Coronary artery disease   . Blood transfusion     no side affects  . GERD (gastroesophageal reflux disease)   . Arthritis   . Diabetes mellitus     Borderline    Past Surgical History  Procedure Date  . Total knee arthroplasty   . Cholecystectomy   . Coronary angioplasty with stent placement     ROS:  As stated in the HPI and negative for all other systems.  PHYSICAL EXAM BP 201/77  Pulse 59  Resp 18  Ht 5\' 4"   (1.626 m)  Wt 261 lb (118.389 kg)  BMI 44.80 kg/m2 GENERAL:  Well appearing HEENT:  Pupils equal round and reactive, fundi not visualized, oral mucosa unremarkable, dentures NECK:  No jugular venous distention, waveform within normal limits, carotid upstroke brisk and symmetric, no bruits, no thyromegaly LYMPHATICS:  No cervical, inguinal adenopathy LUNGS:  Clear to auscultation bilaterally BACK:  No CVA tenderness CHEST:  Unremarkable HEART:  PMI not displaced or sustained,S1 and S2 within normal limits, no S3, no S4, no clicks, no rubs, no murmurs ABD:  Flat, positive bowel sounds normal in frequency in pitch, no bruits, no rebound, no guarding, no midline pulsatile mass, no hepatomegaly, no splenomegaly, obese EXT:  2 plus pulses throughout, no edema, no cyanosis no clubbing SKIN:  No rashes no nodules NEURO:  Cranial nerves II through XII grossly intact, motor grossly intact throughout PSYCH:  Cognitively intact, oriented to person place and time   ASSESSMENT AND PLAN

## 2011-11-23 NOTE — Assessment & Plan Note (Signed)
Will increase the lisinopril to 40 mg daily. She will need a basic metabolic profile in 2 weeks.

## 2011-11-26 DIAGNOSIS — G5602 Carpal tunnel syndrome, left upper limb: Secondary | ICD-10-CM | POA: Insufficient documentation

## 2011-11-26 NOTE — Assessment & Plan Note (Signed)
She has had no recurrence of her symptoms that brought her into the hospital.  She is followed by Dr. Percival Spanish and has a follow up visit with him tomorrow.

## 2011-11-28 NOTE — Assessment & Plan Note (Signed)
Lab Results  Component Value Date   HGBA1C 6.3* 11/02/2011   HGBA1C 6.0* 06/17/2010   Lab Results  Component Value Date   MICROALBUR 36.10* 06/17/2010   LDLCALC 155* 11/03/2011   CREATININE 1.30* 11/04/2011   Her A1C is mildly higher then before but still below 7.  We discussed making sure she was watching her diet as well as exercising to continue controlling her diabetes well.

## 2011-11-28 NOTE — Assessment & Plan Note (Signed)
Her findings are consistent with carpal tunnel on the left that would explain the current symptoms.  She did present with what could have been TIA but currently has no other symptoms.  We will have her pick up a wrist splint to wear at night to help protect her wrist.  She was counseled to call immediately if she noticed a change in her symptoms.

## 2011-11-28 NOTE — Assessment & Plan Note (Signed)
Lab Results  Component Value Date   CHOL 224* 11/03/2011   CHOL 171 09/28/2010   CHOL 160 04/02/2010   Lab Results  Component Value Date   HDL 58 11/03/2011   HDL 60 09/28/2010   HDL 53 04/02/2010   Lab Results  Component Value Date   LDLCALC 155* 11/03/2011   LDLCALC 100* 09/28/2010   LDLCALC 97 04/02/2010   Lab Results  Component Value Date   TRIG 53 11/03/2011   TRIG 53 09/28/2010   TRIG 51 04/02/2010   Lab Results  Component Value Date   CHOLHDL 3.9 11/03/2011   CHOLHDL 2.9 Ratio 09/28/2010   CHOLHDL 3.0 Ratio 04/02/2010   No results found for this basename: LDLDIRECT   When she presented to the hospital her LDL was 155.  With the question of TIA symptoms while on medication we will shoot for <70 with her.  SHe is on pravastatin and we will need to recheck her cholesterol in about 3 months.

## 2011-12-08 ENCOUNTER — Other Ambulatory Visit: Payer: Medicare Other | Admitting: *Deleted

## 2011-12-10 ENCOUNTER — Other Ambulatory Visit (INDEPENDENT_AMBULATORY_CARE_PROVIDER_SITE_OTHER): Payer: Medicare Other | Admitting: *Deleted

## 2011-12-10 DIAGNOSIS — I10 Essential (primary) hypertension: Secondary | ICD-10-CM

## 2011-12-10 DIAGNOSIS — E785 Hyperlipidemia, unspecified: Secondary | ICD-10-CM

## 2011-12-10 LAB — LIPID PANEL
HDL: 51.9 mg/dL (ref >39.00)
Total CHOL/HDL Ratio: 3
Triglycerides: 56 mg/dL (ref 0.0–149.0)

## 2011-12-10 LAB — BASIC METABOLIC PANEL
CO2: 27 mEq/L (ref 19–32)
Calcium: 9.3 mg/dL (ref 8.4–10.5)
Potassium: 4 mEq/L (ref 3.5–5.1)
Sodium: 141 mEq/L (ref 135–145)

## 2011-12-20 ENCOUNTER — Other Ambulatory Visit: Payer: Self-pay | Admitting: *Deleted

## 2011-12-20 DIAGNOSIS — Z79899 Other long term (current) drug therapy: Secondary | ICD-10-CM

## 2011-12-20 DIAGNOSIS — I10 Essential (primary) hypertension: Secondary | ICD-10-CM

## 2012-01-04 ENCOUNTER — Encounter: Payer: Self-pay | Admitting: Cardiology

## 2012-01-04 ENCOUNTER — Other Ambulatory Visit: Payer: Medicare Other | Admitting: *Deleted

## 2012-01-04 ENCOUNTER — Ambulatory Visit (INDEPENDENT_AMBULATORY_CARE_PROVIDER_SITE_OTHER): Payer: Medicare Other | Admitting: Cardiology

## 2012-01-04 DIAGNOSIS — I6529 Occlusion and stenosis of unspecified carotid artery: Secondary | ICD-10-CM

## 2012-01-04 DIAGNOSIS — G459 Transient cerebral ischemic attack, unspecified: Secondary | ICD-10-CM

## 2012-01-04 DIAGNOSIS — E669 Obesity, unspecified: Secondary | ICD-10-CM

## 2012-01-04 DIAGNOSIS — Z01810 Encounter for preprocedural cardiovascular examination: Secondary | ICD-10-CM

## 2012-01-04 DIAGNOSIS — I2510 Atherosclerotic heart disease of native coronary artery without angina pectoris: Secondary | ICD-10-CM

## 2012-01-04 DIAGNOSIS — I10 Essential (primary) hypertension: Secondary | ICD-10-CM

## 2012-01-04 DIAGNOSIS — F17200 Nicotine dependence, unspecified, uncomplicated: Secondary | ICD-10-CM

## 2012-01-04 NOTE — Assessment & Plan Note (Signed)
I again educated her about the need to stop smoking completely. She says she is only smoking a few cigarettes rarely.

## 2012-01-04 NOTE — Patient Instructions (Signed)
Please have blood work today  Your physician has requested that you have a [place]. For further information please visit [email or web address]. Please follow instruction sheet, as given.  Your physician has requested that you have a carotid duplex in November. This test is an ultrasound of the carotid arteries in your neck. It looks at blood flow through these arteries that supply the brain with blood. Allow one hour for this exam. There are no restrictions or special instructions.  The current medical regimen is effective;  continue present plan and medications.  Follow up in 1 year with Dr Percival Spanish.  You will receive a letter in the mail 2 months before you are due.  Please call us when you receive this letter to schedule your follow up appointment.

## 2012-01-04 NOTE — Assessment & Plan Note (Signed)
The blood pressure is at target. No change in medications is indicated. We will continue with therapeutic lifestyle changes (TLC).  I will check a follow up BMET today.

## 2012-01-04 NOTE — Assessment & Plan Note (Signed)
The patient understands the need to lose weight with diet and exercise. We have discussed specific strategies for this.  

## 2012-01-04 NOTE — Assessment & Plan Note (Signed)
The patient will have stress perfusion imaging. She does have progressive dyspnea. Check catheterization 2003 with stenting of her right coronary artery. She's very sedentary. She could not walk on a treadmill. Therefore, she will need Lexiscan Myoview.

## 2012-01-04 NOTE — Progress Notes (Signed)
HPI Patient presents for followup of her known coronary disease. She was hospitalized late last year with a TIA. I saw her in followup in her blood pressure was not well controlled. I increased her lisinopril. Her blood pressure is better controlled. Of note her creatinine was slightly elevated after this and she failed to show up for followup labs. There was some confusion. She's lost about 5 pounds in the last several months. She's not describing any new chest pressure, neck or arm discomfort. She does have dyspnea with exertion which is slowly progressive. She's fairly sedentary but does some activities of daily living. She's not having any palpitations, presyncope or syncope. She's having no PND or orthopnea or none of the symptoms that were her previous TIA symptoms.  Allergies  Allergen Reactions  . Penicillins Other (See Comments)    unknown    Current Outpatient Prescriptions  Medication Sig Dispense Refill  . aspirin EC 81 MG tablet Take 81 mg by mouth daily.        . chlorthalidone (HYGROTON) 25 MG tablet Take 12.5 mg by mouth at bedtime.        . clopidogrel (PLAVIX) 75 MG tablet Take 1 tablet (75 mg total) by mouth daily with breakfast.  30 tablet  3  . famotidine (PEPCID) 20 MG tablet Take 20 mg by mouth 2 (two) times daily.        Marland Kitchen glipiZIDE (GLUCOTROL) 5 MG tablet Take 5 mg by mouth 2 (two) times daily before a meal.        . lisinopril (PRINIVIL,ZESTRIL) 40 MG tablet Take 1 tablet (40 mg total) by mouth daily.  30 tablet  6  . metoprolol (LOPRESSOR) 50 MG tablet Take 50 mg by mouth 2 (two) times daily.      . pravastatin (PRAVACHOL) 80 MG tablet Take 1 tablet (80 mg total) by mouth daily.  30 tablet  6    Past Medical History  Diagnosis Date  . Hypertension   . Coronary artery disease     Cypher stent to the RCA in 2003  . Blood transfusion     no side affects  . GERD (gastroesophageal reflux disease)   . Arthritis   . Diabetes mellitus     Borderline    Past  Surgical History  Procedure Date  . Total knee arthroplasty   . Cholecystectomy   . Coronary angioplasty with stent placement     ROS:  As stated in the HPI and negative for all other systems.  PHYSICAL EXAM BP 130/60  Pulse 58  Ht 5\' 4"  (1.626 m)  Wt 259 lb (117.482 kg)  BMI 44.46 kg/m2 GENERAL:  Well appearing HEENT:  Pupils equal round and reactive, fundi not visualized, oral mucosa unremarkable, dentures NECK:  No jugular venous distention, waveform within normal limits, carotid upstroke brisk and symmetric, right bruit, no thyromegaly LYMPHATICS:  No cervical, inguinal adenopathy LUNGS:  Clear to auscultation bilaterally BACK:  No CVA tenderness CHEST:  Unremarkable HEART:  PMI not displaced or sustained,S1 and S2 within normal limits, no S3, no S4, no clicks, no rubs, no murmurs ABD:  Flat, positive bowel sounds normal in frequency in pitch, no bruits, no rebound, no guarding, no midline pulsatile mass, no hepatomegaly, no splenomegaly, obese EXT:  2 plus pulses throughout, no edema, no cyanosis no clubbing SKIN:  No rashes no nodules NEURO:  Cranial nerves II through XII grossly intact, motor grossly intact throughout PSYCH:  Cognitively intact, oriented to person place  and time  EKG:  Sinus rhythm, rate 58, axis within normal limits, premature ectopic complexes, possible old anteroseptal infarct. 01/04/2012   ASSESSMENT AND PLAN

## 2012-01-04 NOTE — Assessment & Plan Note (Signed)
She does have 40-59% right carotid stenosis. We will schedule her for followup of this in November of this year. She needs continued risk reduction.

## 2012-01-12 ENCOUNTER — Ambulatory Visit (HOSPITAL_COMMUNITY): Payer: Medicare Other | Attending: Cardiology | Admitting: Radiology

## 2012-01-12 DIAGNOSIS — R0602 Shortness of breath: Secondary | ICD-10-CM

## 2012-01-12 DIAGNOSIS — I2510 Atherosclerotic heart disease of native coronary artery without angina pectoris: Secondary | ICD-10-CM

## 2012-01-12 DIAGNOSIS — Z01810 Encounter for preprocedural cardiovascular examination: Secondary | ICD-10-CM | POA: Insufficient documentation

## 2012-01-12 DIAGNOSIS — R0989 Other specified symptoms and signs involving the circulatory and respiratory systems: Secondary | ICD-10-CM | POA: Insufficient documentation

## 2012-01-12 DIAGNOSIS — R0609 Other forms of dyspnea: Secondary | ICD-10-CM | POA: Insufficient documentation

## 2012-01-12 DIAGNOSIS — Z8673 Personal history of transient ischemic attack (TIA), and cerebral infarction without residual deficits: Secondary | ICD-10-CM | POA: Insufficient documentation

## 2012-01-12 DIAGNOSIS — E785 Hyperlipidemia, unspecified: Secondary | ICD-10-CM

## 2012-01-12 DIAGNOSIS — E119 Type 2 diabetes mellitus without complications: Secondary | ICD-10-CM | POA: Insufficient documentation

## 2012-01-12 DIAGNOSIS — E669 Obesity, unspecified: Secondary | ICD-10-CM | POA: Insufficient documentation

## 2012-01-12 DIAGNOSIS — F17200 Nicotine dependence, unspecified, uncomplicated: Secondary | ICD-10-CM | POA: Insufficient documentation

## 2012-01-12 DIAGNOSIS — I779 Disorder of arteries and arterioles, unspecified: Secondary | ICD-10-CM | POA: Insufficient documentation

## 2012-01-12 MED ORDER — TECHNETIUM TC 99M TETROFOSMIN IV KIT
33.0000 | PACK | Freq: Once | INTRAVENOUS | Status: AC | PRN
  Administered 2012-01-12: 33 via INTRAVENOUS

## 2012-01-12 MED ORDER — REGADENOSON 0.4 MG/5ML IV SOLN
0.4000 mg | Freq: Once | INTRAVENOUS | Status: AC
  Administered 2012-01-12: 0.4 mg via INTRAVENOUS

## 2012-01-12 NOTE — Progress Notes (Signed)
[facility] [address] [phone]  Cardiology Nuclear Med Study  Kathleen [name] is a 66 y.o. female [id number] 06-22-1946   Nuclear Med Background Indication for Stress Test:  Evaluation for Ischemia and Stent Patency History:'03 Heart Catheterization: RCA-Stent, '03 Stent:RCA, '05 ECHO: EF: 55-65% mild AR, '07 MI: EF: 55% (-) ischemia Cardiac Risk Factors: Carotid Disease, NIDDM, Obesity, Smoker and TIA  Symptoms:  DOE and SOB   Nuclear Pre-Procedure Caffeine/Decaff Intake:  None NPO After: 10:30pm   Lungs:  clear IV 0.9% NS with Angio Cath:  20g  IV Site: R Antecubital  IV Started by:  Eliezer Lofts, EMT-P  Chest Size (in):  44 Cup Size: DD  Height: 5\' 4"  (1.626 m)  Weight:  260 lb (117.935 kg)  BMI:  Body mass index is 44.63 kg/(m^2). Tech Comments:  Patient took metoprolol at 10 am.    Nuclear Med Study 1 or 2 day study: 2 day  Stress Test Type:  Carlton Adam  Reading MD: Loralie Champagne, MD  Order Authorizing Provider:  J.Hochrein  Resting Radionuclide: Technetium 62m Tetrofosmin  Resting Radionuclide Dose: 33.0 mCi on 01/13/12   Stress Radionuclide:  Technetium 50m Tetrofosmin  Stress Radionuclide Dose: 33.0 mCi on 01/12/12           Stress Protocol Rest HR: 53 Stress HR: 78  Rest BP: 134/49 Stress BP: 158/62  Exercise Time (min): n/a METS: n/a   Predicted Max HR: 153 bpm % Max HR: 50.98 bpm Rate Pressure Product: 12324   Dose of Adenosine (mg):  n/a Dose of Lexiscan: 0.4 mg  Dose of Atropine (mg): n/a Dose of Dobutamine: n/a mcg/kg/min (at max HR)  Stress Test Technologist: Perrin Maltese, EMT-P  Nuclear Technologist:  Charlton Amor, CNMT     Rest Procedure:  Myocardial perfusion imaging was performed at rest 45 minutes following the intravenous administration of Technetium 88m Tetrofosmin. Rest ECG: Sinus Bradycardia with PAT  Stress Procedure:  The patient received IV Lexiscan 0.4 mg over  15-seconds.  Technetium 29m Tetrofosmin injected at 30-seconds.  There were no significant changes, feel funny, and has sob with Lexiscan.  Quantitative spect images were obtained after a 45 minute delay. Stress ECG: No significant change from baseline ECG  QPS Raw Data Images:  Normal; no motion artifact; normal heart/lung ratio. Stress Images:  Normal homogeneous uptake in all areas of the myocardium. Rest Images:  Normal homogeneous uptake in all areas of the myocardium. Subtraction (SDS):  Normal Transient Ischemic Dilatation (Normal <1.22):  1.03 Lung/Heart Ratio (Normal <0.45):  0.29  Quantitative Gated Spect Images QGS EDV:  111 ml QGS ESV:  48 ml QGS cine images:  NL LV Function; NL Wall Motion QGS EF: 57%  Impression Exercise Capacity:  Lexiscan with no exercise. BP Response:  Normal blood pressure response. Clinical Symptoms:  No chest pain. ECG Impression:  No significant ST segment change suggestive of ischemia. Comparison with Prior Nuclear Study: No images to compare  Overall Impression:  Normal stress nuclear study.   Jenkins Rouge

## 2012-01-13 ENCOUNTER — Emergency Department (HOSPITAL_COMMUNITY)
Admission: EM | Admit: 2012-01-13 | Discharge: 2012-01-13 | Disposition: A | Discharge status: Home / Self Care | Payer: Medicare Other | Attending: Emergency Medicine | Admitting: Emergency Medicine

## 2012-01-13 ENCOUNTER — Encounter (HOSPITAL_COMMUNITY): Payer: Self-pay | Admitting: *Deleted

## 2012-01-13 ENCOUNTER — Ambulatory Visit (HOSPITAL_COMMUNITY): Payer: Medicare Other | Attending: Cardiovascular Disease | Admitting: Radiology

## 2012-01-13 DIAGNOSIS — R51 Headache: Secondary | ICD-10-CM | POA: Insufficient documentation

## 2012-01-13 DIAGNOSIS — M129 Arthropathy, unspecified: Secondary | ICD-10-CM | POA: Insufficient documentation

## 2012-01-13 DIAGNOSIS — Z7982 Long term (current) use of aspirin: Secondary | ICD-10-CM | POA: Insufficient documentation

## 2012-01-13 DIAGNOSIS — E119 Type 2 diabetes mellitus without complications: Secondary | ICD-10-CM | POA: Insufficient documentation

## 2012-01-13 DIAGNOSIS — R040 Epistaxis: Secondary | ICD-10-CM | POA: Insufficient documentation

## 2012-01-13 DIAGNOSIS — I2510 Atherosclerotic heart disease of native coronary artery without angina pectoris: Secondary | ICD-10-CM | POA: Insufficient documentation

## 2012-01-13 DIAGNOSIS — I10 Essential (primary) hypertension: Secondary | ICD-10-CM | POA: Insufficient documentation

## 2012-01-13 DIAGNOSIS — K219 Gastro-esophageal reflux disease without esophagitis: Secondary | ICD-10-CM | POA: Insufficient documentation

## 2012-01-13 DIAGNOSIS — R42 Dizziness and giddiness: Secondary | ICD-10-CM | POA: Insufficient documentation

## 2012-01-13 DIAGNOSIS — Z79899 Other long term (current) drug therapy: Secondary | ICD-10-CM | POA: Insufficient documentation

## 2012-01-13 DIAGNOSIS — R0989 Other specified symptoms and signs involving the circulatory and respiratory systems: Secondary | ICD-10-CM

## 2012-01-13 LAB — POCT I-STAT, CHEM 8
BUN: 40 mg/dL — ABNORMAL HIGH (ref 6–23)
Calcium, Ion: 1.26 mmol/L (ref 1.12–1.32)
Creatinine, Ser: 1.4 mg/dL — ABNORMAL HIGH (ref 0.50–1.10)
TCO2: 25 mmol/L (ref 0–100)

## 2012-01-13 MED ORDER — TECHNETIUM TC 99M TETROFOSMIN IV KIT
33.0000 | PACK | Freq: Once | INTRAVENOUS | Status: AC | PRN
  Administered 2012-01-13: 33 via INTRAVENOUS

## 2012-01-13 NOTE — ED Notes (Signed)
Patient states she had a cardiac stress test done yest. This am woke up with a nosebleed, and states her bloodpressure was up today. No bleeding at present.

## 2012-01-13 NOTE — ED Notes (Signed)
Pt. C/o a nosebleed this AM that woke her up, states also feeling lightheaded, denies CP.

## 2012-01-13 NOTE — ED Provider Notes (Signed)
This 66 year old female has had frequent headaches ever since a stroke in November from which she recovered, she states probably a couple times a week she has a global headache the last several hours without associated focal neurologic symptoms and resolved completely. She woke up with a typical headache today that is now almost resolved. She also woke up and realized she had had a nosebleed which is now resolved as well. She had no change in speech vision swallowing or understanding, she had no vertigo no localized weakness numbness or incoordination today. She has no chest pain or shortness of breath. She has an appointment today with cardiology because she had a cardiac catheterization yesterday with access site in the right arm at which she is no significant concerns with no excessive pain swelling or bruising. Her nares are currently clear without active bleeding.  Medical screening examination/treatment/procedure(s) were conducted as a shared visit with non-physician practitioner(s) and myself.  I personally evaluated the patient during the encounter  I doubt any other [facility] precluding discharge at this time including, but not necessarily limited to the following:SAH, CVA.  Babette Relic, MD 01/14/12 [phone]

## 2012-01-13 NOTE — ED Provider Notes (Signed)
History     CSN: [id number]  Arrival date & time 01/13/12  [id number]   First MD Initiated Contact with Patient 01/13/12 [phone]      Chief Complaint  Patient presents with  . Epistaxis   7:10 AM HPI Patient reports she woke up left a right-sided nosebleed. For sports after applying pressure this did subside. Reports associated with headaches and lightheadedness. Reports a significant history of a coronary CT done yesterday by Dr. Lolly Mustache. States he was concerned for carotid artery blockages. Reports headache is posterior and pressure-like. Denies history of irregular headaches. Reports that she is on Plavix.  Patient is a 66 y.o. female presenting with nosebleeds. The history is provided by the patient.  Epistaxis  This is a new problem. The current episode started 1 to 2 hours ago. The problem occurs constantly. The problem has been resolved. The problem is associated with anticoagulants. The bleeding has been from the right nare. She has tried applying pressure for the symptoms. The treatment provided significant relief.    Past Medical History  Diagnosis Date  . Hypertension   . Coronary artery disease     Cypher stent to the RCA in 2003  . Blood transfusion     no side affects  . GERD (gastroesophageal reflux disease)   . Arthritis   . Diabetes mellitus     Borderline    Past Surgical History  Procedure Date  . Total knee arthroplasty   . Cholecystectomy   . Coronary angioplasty with stent placement     History reviewed. No pertinent family history.  History  Substance Use Topics  . Smoking status: Current Everyday Smoker -- 0.3 packs/day for 30 years    Types: Cigarettes  . Smokeless tobacco: Never Used   Comment: Smoking cessation requested   . Alcohol Use: No    OB History    Grav Para Term Preterm Abortions TAB SAB Ect Mult Living                  Review of Systems  Constitutional: Negative for fever and chills.  HENT: Positive for nosebleeds. Negative for  congestion, rhinorrhea, neck pain, neck stiffness and sinus pressure.   Respiratory: Negative for shortness of breath.   Gastrointestinal: Negative for abdominal pain.  Neurological: Positive for light-headedness and headaches. Negative for speech difficulty, weakness and numbness.  All other systems reviewed and are negative.    Allergies  Penicillins  Home Medications   Current Outpatient Rx  Name Route Sig Dispense Refill  . ASPIRIN EC 81 MG PO TBEC Oral Take 81 mg by mouth daily.      . CHLORTHALIDONE 25 MG PO TABS Oral Take 12.5 mg by mouth at bedtime.      . CLOPIDOGREL BISULFATE 75 MG PO TABS Oral Take 1 tablet (75 mg total) by mouth daily with breakfast. 30 tablet 3  . FAMOTIDINE 20 MG PO TABS Oral Take 20 mg by mouth 2 (two) times daily.      Marland Kitchen GLIPIZIDE 5 MG PO TABS Oral Take 5 mg by mouth 2 (two) times daily before a meal.      . LISINOPRIL 40 MG PO TABS Oral Take 1 tablet (40 mg total) by mouth daily. 30 tablet 6  . METOPROLOL TARTRATE 50 MG PO TABS Oral Take 50 mg by mouth 2 (two) times daily.    Marland Kitchen PRAVASTATIN SODIUM 80 MG PO TABS Oral Take 1 tablet (80 mg total) by mouth daily. 30 tablet 6  BP 165/61  Pulse 72  Temp(Src) 98.3 F (36.8 C) (Oral)  Resp 19  SpO2 98%  Physical Exam  Vitals reviewed. Constitutional: She is oriented to person, place, and time. Vital signs are normal. She appears well-developed and well-nourished.  HENT:  Head: Normocephalic and atraumatic.  Right Ear: External ear normal.  Left Ear: External ear normal.  Nose: Nose normal.  Mouth/Throat: Oropharynx is clear and moist. No oropharyngeal exudate.  Eyes: Conjunctivae and EOM are normal. Pupils are equal, round, and reactive to light.  Neck: Normal range of motion. Neck supple.  Cardiovascular: Normal rate, regular rhythm and normal heart sounds.  Exam reveals no friction rub.   No murmur heard. Pulmonary/Chest: Effort normal and breath sounds normal. She has no wheezes. She has no  rhonchi. She has no rales. She exhibits no tenderness.  Musculoskeletal: Normal range of motion.  Neurological: She is alert and oriented to person, place, and time. No cranial nerve deficit. She exhibits normal muscle tone. Coordination normal.       Negative pronator drift. No past pointing.   Skin: Skin is warm and dry. No rash noted. No erythema. No pallor.    ED Course  Procedures    MDM  7:38 AM Pt now Reports a stroke in November 2012. States mild headaches since then. Reports HA has almost completely resolved. No neuro deficits. Will cancel Head CT  8:38 AM Results for orders placed during the hospital encounter of 01/13/12  POCT I-STAT, CHEM 8      Component Value Range   Sodium 143  135 - 145 (mEq/L)   Potassium 4.1  3.5 - 5.1 (mEq/L)   Chloride 109  96 - 112 (mEq/L)   BUN 40 (*) 6 - 23 (mg/dL)   Creatinine, Ser 1.40 (*) 0.50 - 1.10 (mg/dL)   Glucose, Bld 118 (*) 70 - 99 (mg/dL)   Calcium, Ion 1.26  1.12 - 1.32 (mmol/L)   TCO2 25  0 - 100 (mmol/L)   Hemoglobin 10.5 (*) 12.0 - 15.0 (g/dL)   HCT 31.0 (*) 36.0 - 46.0 (%)     Patient has mildly elevated BUN and Cr. This appears to have gradually been increasing for the last several months. Hgb has drop 2 units in 1 months. Patient las has low hgb in past but since she is presenting with nosebleed I advised her to have this rechecked either tomorrow or Monday. Pt agrees with plan and is ready for d/c    Sheliah Mends, PA-C 01/13/12 [id number]

## 2012-01-14 NOTE — ED Provider Notes (Signed)
Medical screening examination/treatment/procedure(s) were conducted as a shared visit with non-physician practitioner(s) and myself.  I personally evaluated the patient during the encounter  Babette Relic, MD 01/14/12 [phone]

## 2012-01-25 ENCOUNTER — Telehealth: Payer: Self-pay | Admitting: Cardiology

## 2012-01-25 NOTE — Telephone Encounter (Signed)
Pt was notified of nuclear test results.

## 2012-01-25 NOTE — Telephone Encounter (Signed)
New msg Patient returning your call

## 2012-01-25 NOTE — Telephone Encounter (Signed)
N/A.  LMTC. 

## 2012-04-27 ENCOUNTER — Encounter: Payer: Self-pay | Admitting: Cardiology

## 2012-09-19 ENCOUNTER — Emergency Department (INDEPENDENT_AMBULATORY_CARE_PROVIDER_SITE_OTHER)
Admission: EM | Admit: 2012-09-19 | Discharge: 2012-09-19 | Disposition: A | Discharge status: Home / Self Care | Payer: Medicare Other | Source: Home / Self Care | Attending: Family Medicine | Admitting: Family Medicine

## 2012-09-19 ENCOUNTER — Encounter (HOSPITAL_COMMUNITY): Payer: Self-pay | Admitting: *Deleted

## 2012-09-19 DIAGNOSIS — H04209 Unspecified epiphora, unspecified lacrimal gland: Secondary | ICD-10-CM

## 2012-09-19 DIAGNOSIS — J329 Chronic sinusitis, unspecified: Secondary | ICD-10-CM

## 2012-09-19 MED ORDER — SALINE NASAL SPRAY 0.65 % NA SOLN: 1.0000 | NASAL | Status: DC | PRN

## 2012-09-19 MED ORDER — LORATADINE 10 MG PO TABS: 10.0000 mg | ORAL_TABLET | Freq: Every day | ORAL | Status: DC

## 2012-09-19 NOTE — ED Provider Notes (Signed)
Medical screening examination/treatment/procedure(s) were performed by resident physician or non-physician practitioner and as supervising physician I was immediately available for consultation/collaboration.   Pauline Good MD.    Billy Fischer, MD 09/19/12 2051

## 2012-09-19 NOTE — ED Provider Notes (Signed)
History     CSN: [id number]  Arrival date & time 09/19/12  1242   First MD Initiated Contact with Patient 09/19/12 1404      Chief Complaint  Patient presents with  . Eye Problem    (Consider location/radiation/quality/duration/timing/severity/associated sxs/prior treatment) Patient is a 66 y.o. female presenting with eye problem. The history is provided by the patient.  Eye Problem  This is a new problem. The current episode started more than 2 days ago. The problem occurs constantly. The problem has not changed since onset.There is pain in the right eye. The injury mechanism was a direct trauma. The pain is mild. There is no history of trauma to the eye. There is no known exposure to pink eye. She does not wear contacts. Pertinent negatives include no blurred vision, no decreased vision, no discharge, no double vision, no foreign body sensation, no photophobia, no eye redness, no nausea, no vomiting, no weakness and no itching. Treatments tried: warm compresses. The treatment provided mild relief.  pressure below right eye associated with cold symptoms.  Past Medical History  Diagnosis Date  . Hypertension   . Coronary artery disease     Cypher stent to the RCA in 2003  . Blood transfusion     no side affects  . GERD (gastroesophageal reflux disease)   . Arthritis   . Diabetes mellitus     Borderline    Past Surgical History  Procedure Date  . Total knee arthroplasty   . Cholecystectomy   . Coronary angioplasty with stent placement     Family History  Problem Relation Age of Onset  . Family history unknown: Yes    History  Substance Use Topics  . Smoking status: Current Every Day Smoker -- 0.3 packs/day for 30 years    Types: Cigarettes  . Smokeless tobacco: Never Used   Comment: Smoking cessation requested   . Alcohol Use: No    OB History    Grav Para Term Preterm Abortions TAB SAB Ect Mult Living                  Review of Systems  Constitutional:  Negative.   HENT: Positive for sore throat and rhinorrhea.   Eyes: Negative for blurred vision, double vision, photophobia, discharge and redness.  Respiratory: Negative.   Cardiovascular: Negative.   Gastrointestinal: Negative for nausea and vomiting.  Skin: Negative for itching.  Neurological: Negative for weakness.    Allergies  Penicillins  Home Medications   Current Outpatient Rx  Name Route Sig Dispense Refill  . ASPIRIN EC 81 MG PO TBEC Oral Take 81 mg by mouth daily.      . CHLORTHALIDONE 25 MG PO TABS Oral Take 12.5 mg by mouth at bedtime.      . CLOPIDOGREL BISULFATE 75 MG PO TABS Oral Take 1 tablet (75 mg total) by mouth daily with breakfast. 30 tablet 3  . FAMOTIDINE 20 MG PO TABS Oral Take 20 mg by mouth 2 (two) times daily.      Marland Kitchen GLIPIZIDE 5 MG PO TABS Oral Take 5 mg by mouth 2 (two) times daily before a meal. On hold for procedure    . LISINOPRIL 40 MG PO TABS Oral Take 1 tablet (40 mg total) by mouth daily. 30 tablet 6  . LORATADINE 10 MG PO TABS Oral Take 1 tablet (10 mg total) by mouth daily. 30 tablet 2  . METOPROLOL TARTRATE 50 MG PO TABS Oral Take 50 mg by mouth  2 (two) times daily.    Marland Kitchen PRAVASTATIN SODIUM 80 MG PO TABS Oral Take 1 tablet (80 mg total) by mouth daily. 30 tablet 6  . SALINE NASAL SPRAY 0.65 % NA SOLN Nasal Place 1 spray into the nose as needed for congestion. 30 mL 12    BP 185/60  Pulse 80  Temp 98.6 F (37 C) (Oral)  Resp 18  SpO2 100%  Physical Exam  Nursing note and vitals reviewed. Constitutional: She is oriented to person, place, and time. Vital signs are normal. She appears well-developed and well-nourished. She is active and cooperative.  HENT:  Head: Normocephalic.  Right Ear: External ear normal.  Left Ear: External ear normal.  Nose: Nose normal.  Mouth/Throat: Oropharynx is clear and moist. No oropharyngeal exudate.  Eyes: Conjunctivae normal and EOM are normal. Pupils are equal, round, and reactive to light. Right eye  exhibits no chemosis, no discharge, no exudate and no hordeolum. No foreign body present in the right eye. Left eye exhibits no chemosis, no discharge, no exudate and no hordeolum. No foreign body present in the left eye. No scleral icterus.       Right eye tearing  Neck: Trachea normal. Neck supple. No thyromegaly present.  Cardiovascular: Normal rate, regular rhythm, normal heart sounds and intact distal pulses.   Pulmonary/Chest: Effort normal and breath sounds normal.  Lymphadenopathy:    She has no cervical adenopathy.  Neurological: She is alert and oriented to person, place, and time. No cranial nerve deficit or sensory deficit.  Skin: Skin is warm and dry.  Psychiatric: She has a normal mood and affect. Her speech is normal and behavior is normal. Judgment and thought content normal. Cognition and memory are normal.    ED Course  Procedures (including critical care time)  Labs Reviewed - No data to display No results found.   1. Eye tearing   2. Sinusitis       MDM  Warm compresses twice daily.  Follow up with opthalmologist for further evaluation.       Awilda Metro, NP 09/19/12 1442

## 2012-09-19 NOTE — ED Notes (Signed)
Pt reports eye drainage - clear since Sunday tender to touch

## 2012-10-13 ENCOUNTER — Encounter (INDEPENDENT_AMBULATORY_CARE_PROVIDER_SITE_OTHER): Payer: Medicare Other

## 2012-10-13 DIAGNOSIS — I2510 Atherosclerotic heart disease of native coronary artery without angina pectoris: Secondary | ICD-10-CM

## 2012-10-13 DIAGNOSIS — I6529 Occlusion and stenosis of unspecified carotid artery: Secondary | ICD-10-CM

## 2012-10-13 DIAGNOSIS — G459 Transient cerebral ischemic attack, unspecified: Secondary | ICD-10-CM

## 2012-10-25 ENCOUNTER — Encounter: Payer: Self-pay | Admitting: Cardiology

## 2012-10-25 ENCOUNTER — Ambulatory Visit (INDEPENDENT_AMBULATORY_CARE_PROVIDER_SITE_OTHER): Payer: Medicare Other | Admitting: Cardiology

## 2012-10-25 VITALS — BP 136/60 | HR 80 | Ht 64.0 in | Wt 269.6 lb

## 2012-10-25 DIAGNOSIS — F17200 Nicotine dependence, unspecified, uncomplicated: Secondary | ICD-10-CM

## 2012-10-25 DIAGNOSIS — I6529 Occlusion and stenosis of unspecified carotid artery: Secondary | ICD-10-CM

## 2012-10-25 DIAGNOSIS — N189 Chronic kidney disease, unspecified: Secondary | ICD-10-CM

## 2012-10-25 DIAGNOSIS — E669 Obesity, unspecified: Secondary | ICD-10-CM

## 2012-10-25 DIAGNOSIS — R55 Syncope and collapse: Secondary | ICD-10-CM

## 2012-10-25 DIAGNOSIS — I2510 Atherosclerotic heart disease of native coronary artery without angina pectoris: Secondary | ICD-10-CM

## 2012-10-25 NOTE — Progress Notes (Signed)
HPI Patient presents for followup of her known coronary disease. She had a stress perfusion study earlier this year that was negative for any evidence of ischemia or infarct. She's not describing any new chest pressure, neck or arm discomfort. She does have dyspnea with exertion which is slowly progressive. She's fairly sedentary but does some activities of daily living. She's not having any palpitations, presyncope or syncope. She's having no PND or orthopnea or none of the symptoms that were her previous TIA symptoms. She says that she is exercising routinely.  Allergies  Allergen Reactions  . Penicillins Other (See Comments)    unknown    Current Outpatient Prescriptions  Medication Sig Dispense Refill  . aspirin EC 81 MG tablet Take 81 mg by mouth daily.        . chlorthalidone (HYGROTON) 25 MG tablet Take 12.5 mg by mouth at bedtime.        . clopidogrel (PLAVIX) 75 MG tablet Take 1 tablet (75 mg total) by mouth daily with breakfast.  30 tablet  3  . famotidine (PEPCID) 20 MG tablet Take 20 mg by mouth 2 (two) times daily.        Marland Kitchen glipiZIDE (GLUCOTROL) 5 MG tablet Take 5 mg by mouth 2 (two) times daily before a meal. On hold for procedure      . lisinopril (PRINIVIL,ZESTRIL) 40 MG tablet Take 1 tablet (40 mg total) by mouth daily.  30 tablet  6  . metoprolol (LOPRESSOR) 50 MG tablet Take 50 mg by mouth 2 (two) times daily.      . pravastatin (PRAVACHOL) 80 MG tablet Take 1 tablet (80 mg total) by mouth daily.  30 tablet  6  . sodium chloride (OCEAN NASAL SPRAY) 0.65 % nasal spray Place 1 spray into the nose as needed for congestion.  30 mL  12    Past Medical History  Diagnosis Date  . Hypertension   . Coronary artery disease     Cypher stent to the RCA in 2003  . Blood transfusion     no side affects  . GERD (gastroesophageal reflux disease)   . Arthritis   . Diabetes mellitus     Borderline    Past Surgical History  Procedure Date  . Total knee arthroplasty   .  Cholecystectomy   . Coronary angioplasty with stent placement     ROS:  As stated in the HPI and negative for all other systems.  PHYSICAL EXAM BP 136/60  Pulse 80  Ht 5\' 4"  (1.626 m)  Wt 269 lb 9.6 oz (122.29 kg)  BMI 46.28 kg/m2 GENERAL:  Well appearing HEENT:  Pupils equal round and reactive, fundi not visualized, oral mucosa unremarkable, dentures NECK:  No jugular venous distention, waveform within normal limits, carotid upstroke brisk and symmetric, right bruit, no thyromegaly LUNGS:  Clear to auscultation bilaterally BACK:  No CVA tenderness CHEST:  Unremarkable HEART:  PMI not displaced or sustained,S1 and S2 within normal limits, no S3, no S4, no clicks, no rubs, no murmurs ABD:  Flat, positive bowel sounds normal in frequency in pitch, no bruits, no rebound, no guarding, no midline pulsatile mass, no hepatomegaly, no splenomegaly, obese EXT:  2 plus pulses throughout, no edema, no cyanosis no clubbing   EKG:  Sinus rhythm, rate 80, axis within normal limits, premature ectopic complexes, possible old anteroseptal infarct. 10/25/2012   ASSESSMENT AND PLAN  C A D -  The patient has no new sypmtoms.  No further cardiovascular  testing is indicated.  We will continue with aggressive risk reduction and meds as listed.  Unfortunately she is not anticipating aggressively and risk reduction.  She can stop her Plavix.   HYPERTENSION, UNSPECIFIED -  The blood pressure is at target. No change in medications is indicated. We will continue with therapeutic lifestyle changes (TLC).   TOBACCO ABUSE -  I again educated her about the need to stop smoking completely. She says she is unable to do this because of stress  OBESITY -  The patient understands the need to lose weight with diet and exercise. We have discussed specific strategies for this. She has switched to wheat macaroni.  I encourage more these behaviors.  Carotid stenosis - She does have 40-59% left carotid stenosis and  less than 40%.  We will schedule her for followup of this in November of next year. She needs continued risk reduction.

## 2012-10-25 NOTE — Patient Instructions (Addendum)
Please stop your Plavix. Continue all other medications as listed.  Follow up in 1 year with Dr Percival Spanish.  You will receive a letter in the mail 2 months before you are due.  Please call us when you receive this letter to schedule your follow up appointment.

## 2013-01-01 ENCOUNTER — Encounter (HOSPITAL_COMMUNITY): Payer: Self-pay | Admitting: Emergency Medicine

## 2013-01-01 ENCOUNTER — Emergency Department (INDEPENDENT_AMBULATORY_CARE_PROVIDER_SITE_OTHER): Payer: Medicare Other

## 2013-01-01 ENCOUNTER — Emergency Department (INDEPENDENT_AMBULATORY_CARE_PROVIDER_SITE_OTHER)
Admission: EM | Admit: 2013-01-01 | Discharge: 2013-01-01 | Disposition: A | Discharge status: Home / Self Care | Payer: Medicare Other | Source: Home / Self Care | Attending: Emergency Medicine | Admitting: Emergency Medicine

## 2013-01-01 DIAGNOSIS — H04209 Unspecified epiphora, unspecified lacrimal gland: Secondary | ICD-10-CM

## 2013-01-01 DIAGNOSIS — M109 Gout, unspecified: Secondary | ICD-10-CM

## 2013-01-01 DIAGNOSIS — J329 Chronic sinusitis, unspecified: Secondary | ICD-10-CM

## 2013-01-01 LAB — URIC ACID: Uric Acid, Serum: 8.4 mg/dL — ABNORMAL HIGH (ref 2.4–7.0)

## 2013-01-01 MED ORDER — COLCHICINE 0.6 MG PO TABS: ORAL_TABLET | ORAL | Status: DC

## 2013-01-01 MED ORDER — PREDNISONE 20 MG PO TABS: 20.0000 mg | ORAL_TABLET | Freq: Two times a day (BID) | ORAL | Status: DC

## 2013-01-01 NOTE — ED Notes (Signed)
Triage delay secondary to department acuity/transfer

## 2013-01-01 NOTE — ED Notes (Signed)
Family at bedside. 

## 2013-01-01 NOTE — ED Notes (Signed)
Left foot pain onset Saturday.  Patient has applied heating pad, has soaked foot, applied cream.  Onset of pain Saturday.  Reports getting up Saturday and left foot/ankle was swollen and painful, denies wound, denies injury

## 2013-01-01 NOTE — ED Provider Notes (Signed)
Chief Complaint  Patient presents with  . Foot Pain    History of Present Illness:   Kathleen [name] is a 67 year old female who has had a three-day history of left ankle pain, swelling, and pain with movement. She denies any injury to the ankle. She's had no fever, chills, or other joint pain. She denies any prior history of gout, but does have a brother who had gout. There is no numbness or tingling. She has hypertension, coronary artery disease, has a stent, and is on a statin drug.  Review of Systems:  Other than noted above, the patient denies any of the following symptoms: Systemic:  No fevers, chills, sweats, or aches.  No fatigue or tiredness. Musculoskeletal:  No joint pain, arthritis, bursitis, swelling, back pain, or neck pain. Neurological:  No muscular weakness, paresthesias, headache, or trouble with speech or coordination.  No dizziness.  Zachary:  Past medical history, family history, social history, meds, and allergies were reviewed.  Physical Exam:   Vital signs:  BP 177/74  Pulse 82  Temp 98 F (36.7 C) (Oral)  Resp 22  SpO2 96% Gen:  Alert and oriented times 3.  In no distress. Musculoskeletal: There was swelling, erythema, heat, and tenderness to palpation over the left ankle. There is pain with movement and limited range of motion. There was slight pain to palpation over all of the MTP joints of the foot. No calf swelling or tenderness.  Otherwise, all joints had a full a ROM with no swelling, bruising or deformity.  No edema, dorsalis pedis pulse was not felt. Extremities were warm and pink.  Capillary refill was brisk.  Skin:  Clear, warm and dry.  No rash. Neuro:  Alert and oriented times 3.  Muscle strength was normal.  Sensation was intact to light touch.   Radiology:  Dg Ankle Complete Left  01/01/2013  *RADIOLOGY REPORT*  Clinical Data: Left ankle pain and swelling.  No known injury.  LEFT ANKLE COMPLETE - 3+ VIEW  Comparison: None.  Findings: Soft tissue  swelling is seen overlying the lateral malleolus.  There is no evidence of acute fracture or dislocation. No definite evidence of ankle joint effusion.  The mild degenerative spur formation is seen arising from the medial malleolus.  Prominent plantar and dorsal calcaneal spurs are also noted.  IMPRESSION: Lateral soft tissue swelling.  No evidence of fracture or other acute osseous abnormality.   Original Report Authenticated By: Earle Gell, M.D.    I reviewed the images independently and personally and concur with the radiologist's findings.  Results for orders placed during the hospital encounter of 01/01/13  URIC ACID      Component Value Range   Uric Acid, Serum 8.4 (*) 2.4 - 7.0 mg/dL   Assessment:  The encounter diagnosis was Gout attack.  Plan:   1.  The following meds were prescribed:   New Prescriptions   COLCHICINE 0.6 MG TABLET    Take 2 now and 1 in 1 hour.  May repeat dose once daily.  For gout attack.   PREDNISONE (DELTASONE) 20 MG TABLET    Take 1 tablet (20 mg total) by mouth 2 (two) times daily.   2.  The patient was instructed in symptomatic care, including rest and activity, elevation, application of ice and compression.  Appropriate handouts were given. 3.  The patient was told to return if becoming worse in any way, if no better in 3 or 4 days, and given some red flag symptoms  that would indicate earlier return.   4.  The patient was told to follow up with her primary care physician for the elevated uric acid level.    Harden Mo, MD 01/01/13 [phone]

## 2013-01-29 ENCOUNTER — Other Ambulatory Visit: Payer: Self-pay | Admitting: Internal Medicine

## 2013-01-29 DIAGNOSIS — Z1231 Encounter for screening mammogram for malignant neoplasm of breast: Secondary | ICD-10-CM

## 2013-03-01 ENCOUNTER — Ambulatory Visit
Admission: RE | Admit: 2013-03-01 | Discharge: 2013-03-01 | Disposition: A | Discharge status: Home / Self Care | Payer: Medicare Other | Source: Ambulatory Visit | Attending: Internal Medicine | Admitting: Internal Medicine

## 2013-03-01 DIAGNOSIS — Z1231 Encounter for screening mammogram for malignant neoplasm of breast: Secondary | ICD-10-CM

## 2013-05-21 ENCOUNTER — Other Ambulatory Visit: Payer: Self-pay | Admitting: Internal Medicine

## 2013-05-21 DIAGNOSIS — E2839 Other primary ovarian failure: Secondary | ICD-10-CM

## 2013-05-29 ENCOUNTER — Ambulatory Visit
Admission: RE | Admit: 2013-05-29 | Discharge: 2013-05-29 | Disposition: A | Discharge status: Home / Self Care | Payer: Medicare Other | Source: Ambulatory Visit | Attending: Internal Medicine | Admitting: Internal Medicine

## 2013-05-29 DIAGNOSIS — E2839 Other primary ovarian failure: Secondary | ICD-10-CM

## 2013-06-21 ENCOUNTER — Other Ambulatory Visit: Payer: Self-pay

## 2013-10-16 ENCOUNTER — Emergency Department (INDEPENDENT_AMBULATORY_CARE_PROVIDER_SITE_OTHER)
Admission: EM | Admit: 2013-10-16 | Discharge: 2013-10-16 | Disposition: A | Discharge status: Home / Self Care | Payer: Medicare Other | Source: Home / Self Care

## 2013-10-16 ENCOUNTER — Emergency Department (INDEPENDENT_AMBULATORY_CARE_PROVIDER_SITE_OTHER): Payer: Medicare Other

## 2013-10-16 ENCOUNTER — Encounter (HOSPITAL_COMMUNITY): Payer: Self-pay | Admitting: Emergency Medicine

## 2013-10-16 DIAGNOSIS — J40 Bronchitis, not specified as acute or chronic: Secondary | ICD-10-CM

## 2013-10-16 DIAGNOSIS — M109 Gout, unspecified: Secondary | ICD-10-CM

## 2013-10-16 DIAGNOSIS — J9801 Acute bronchospasm: Secondary | ICD-10-CM

## 2013-10-16 DIAGNOSIS — F17200 Nicotine dependence, unspecified, uncomplicated: Secondary | ICD-10-CM

## 2013-10-16 DIAGNOSIS — J329 Chronic sinusitis, unspecified: Secondary | ICD-10-CM

## 2013-10-16 DIAGNOSIS — R05 Cough: Secondary | ICD-10-CM

## 2013-10-16 DIAGNOSIS — H04209 Unspecified epiphora, unspecified lacrimal gland: Secondary | ICD-10-CM

## 2013-10-16 MED ORDER — METHYLPREDNISOLONE 4 MG PO KIT: PACK | ORAL | Status: DC

## 2013-10-16 MED ORDER — AZITHROMYCIN 250 MG PO TABS: 250.0000 mg | ORAL_TABLET | Freq: Every day | ORAL | Status: DC

## 2013-10-16 MED ORDER — TRIAMCINOLONE ACETONIDE 40 MG/ML IJ SUSP
INTRAMUSCULAR | Status: AC
  Filled 2013-10-16: qty 1

## 2013-10-16 MED ORDER — TRIAMCINOLONE ACETONIDE 40 MG/ML IJ SUSP
40.0000 mg | Freq: Once | INTRAMUSCULAR | Status: AC
  Administered 2013-10-16: 40 mg via INTRAMUSCULAR

## 2013-10-16 MED ORDER — ALBUTEROL SULFATE (5 MG/ML) 0.5% IN NEBU
INHALATION_SOLUTION | RESPIRATORY_TRACT | Status: AC
  Filled 2013-10-16: qty 1

## 2013-10-16 MED ORDER — ALBUTEROL SULFATE HFA 108 (90 BASE) MCG/ACT IN AERS: 1.0000 | INHALATION_SPRAY | Freq: Four times a day (QID) | RESPIRATORY_TRACT | Status: DC | PRN

## 2013-10-16 MED ORDER — IPRATROPIUM BROMIDE 0.02 % IN SOLN
0.5000 mg | Freq: Once | RESPIRATORY_TRACT | Status: AC
  Administered 2013-10-16: 0.5 mg via RESPIRATORY_TRACT

## 2013-10-16 MED ORDER — SODIUM CHLORIDE 0.9 % IN NEBU
INHALATION_SOLUTION | RESPIRATORY_TRACT | Status: AC
  Filled 2013-10-16: qty 3

## 2013-10-16 MED ORDER — ALBUTEROL SULFATE (5 MG/ML) 0.5% IN NEBU
5.0000 mg | INHALATION_SOLUTION | Freq: Once | RESPIRATORY_TRACT | Status: AC
  Administered 2013-10-16: 5 mg via RESPIRATORY_TRACT

## 2013-10-16 MED ORDER — IPRATROPIUM BROMIDE 0.02 % IN SOLN
RESPIRATORY_TRACT | Status: AC
  Filled 2013-10-16: qty 2.5

## 2013-10-16 NOTE — ED Notes (Signed)
Pt c/o cold sxs onset 9 days Sxs include: cough w/white phlegm, v/n/d... Woke up this am around 0700 w/x1 episode of vomiting Denies: fevers, SOB, wheezing Alert w/no signs of acute distress.

## 2013-10-16 NOTE — ED Provider Notes (Signed)
CSN: [id number]     Arrival date & time 10/16/13  1319 History   None    Chief Complaint  Patient presents with  . URI   (Consider location/radiation/quality/duration/timing/severity/associated sxs/prior Treatment) HPI Comments: Morbidly obese 67 year old female presents with her cough is loose. This morning she had one episode of vomiting and loose stool. She denies fever or runny nose. She has a history of hypertension, CAD, GERD, diabetes mellitus and a family smoker.   Past Medical History  Diagnosis Date  . Hypertension   . Coronary artery disease     Cypher stent to the RCA in 2003  . Blood transfusion     no side affects  . GERD (gastroesophageal reflux disease)   . Arthritis   . Diabetes mellitus     Borderline   Past Surgical History  Procedure Laterality Date  . Total knee arthroplasty    . Cholecystectomy    . Coronary angioplasty with stent placement     No family history on file. History  Substance Use Topics  . Smoking status: Current Every Day Smoker -- 0.30 packs/day for 30 years    Types: Cigarettes  . Smokeless tobacco: Never Used     Comment: Smoking cessation requested   . Alcohol Use: No   OB History   Grav Para Term Preterm Abortions TAB SAB Ect Mult Living                 Review of Systems  Constitutional: Positive for activity change. Negative for fever, chills, appetite change and fatigue.  HENT: Negative for facial swelling, postnasal drip and rhinorrhea.   Eyes: Negative.   Respiratory: Positive for cough and shortness of breath. Negative for chest tightness.   Cardiovascular: Negative.   Gastrointestinal:       As per HPI  Musculoskeletal: Negative for neck pain and neck stiffness.  Skin: Negative for pallor and rash.  Neurological: Negative.     Allergies  Penicillins  Home Medications   Current Outpatient Rx  Name  Route  Sig  Dispense  Refill  . albuterol (PROVENTIL HFA;VENTOLIN HFA) 108 (90 BASE) MCG/ACT inhaler  Inhalation   Inhale 1-2 puffs into the lungs every 6 (six) hours as needed for wheezing or shortness of breath.   1 Inhaler   0   . aspirin EC 81 MG tablet   Oral   Take 81 mg by mouth daily.           Marland Kitchen azithromycin (ZITHROMAX) 250 MG tablet   Oral   Take 1 tablet (250 mg total) by mouth daily. 2 tabs po on day 1, 1 tab po on days 2-5   6 tablet   0   . chlorthalidone (HYGROTON) 25 MG tablet   Oral   Take 12.5 mg by mouth at bedtime.           . colchicine 0.6 MG tablet      Take 2 now and 1 in 1 hour.  May repeat dose once daily.  For gout attack.   12 tablet   0   . famotidine (PEPCID) 20 MG tablet   Oral   Take 20 mg by mouth 2 (two) times daily.           Marland Kitchen glipiZIDE (GLUCOTROL) 5 MG tablet   Oral   Take 5 mg by mouth 2 (two) times daily before a meal. On hold for procedure         . lisinopril (PRINIVIL,ZESTRIL) 40 MG  tablet   Oral   Take 1 tablet (40 mg total) by mouth daily.   30 tablet   6   . methylPREDNISolone (MEDROL DOSEPAK) 4 MG tablet      follow package directions   21 tablet   0   . metoprolol (LOPRESSOR) 50 MG tablet   Oral   Take 50 mg by mouth 2 (two) times daily.         Marland Kitchen EXPIRED: pravastatin (PRAVACHOL) 80 MG tablet   Oral   Take 1 tablet (80 mg total) by mouth daily.   30 tablet   6   . predniSONE (DELTASONE) 20 MG tablet   Oral   Take 1 tablet (20 mg total) by mouth 2 (two) times daily.   10 tablet   0   . sodium chloride (OCEAN NASAL SPRAY) 0.65 % nasal spray   Nasal   Place 1 spray into the nose as needed for congestion.   30 mL   12    BP 179/75  Pulse 69  Temp(Src) 98 F (36.7 C) (Oral)  Resp 18  SpO2 96% Physical Exam  Nursing note and vitals reviewed. Constitutional: She is oriented to person, place, and time. She appears well-developed and well-nourished. No distress.  HENT:  Right Ear: External ear normal.  Left Ear: External ear normal.  Mouth/Throat: Oropharynx is clear and moist. No  oropharyngeal exudate.  Eyes: Conjunctivae and EOM are normal.  Neck: Normal range of motion. Neck supple.  Cardiovascular: Normal rate, regular rhythm and normal heart sounds.   Pulmonary/Chest: Effort normal. No respiratory distress. She has wheezes.  Prolonged expiratory phase.  Lymphadenopathy:    She has no cervical adenopathy.  Neurological: She is alert and oriented to person, place, and time.  Skin: Skin is warm and dry.  Psychiatric: She has a normal mood and affect.    ED Course  Procedures (including critical care time) Labs Review Labs Reviewed - No data to display Imaging Review Dg Chest 2 View  10/16/2013   CLINICAL DATA:  Cough and wheezing  EXAM: CHEST  2 VIEW  COMPARISON:  September 24, 2008  FINDINGS: Lungs are clear. Heart is upper normal in size with normal pulmonary vascularity. No adenopathy. There is atherosclerotic change in the aorta. There is degenerative change in the thoracic spine.  IMPRESSION: No edema or consolidation.   Electronically Signed   By: Lowella Grip M.D.   On: 10/16/2013 15:32        MDM   1. Cough   2. Bronchospasm   3. Bronchitis   4. Tobacco use disorder      1512h:  Post Duoneb with improved air movement and breath sounds. Few scattered exp wheezes.  D/C with albuterol HFA Z pack Medrol dose pack. See your PCP later this week or next week or return as needed.  Janne Napoleon, NP 10/16/13 [phone]

## 2013-10-17 NOTE — ED Provider Notes (Signed)
X-rays reviewed and report per radiologist.  Billy Fischer, MD 10/17/13 2000

## 2013-11-20 ENCOUNTER — Encounter: Payer: Self-pay | Admitting: Advanced Practice Midwife

## 2014-01-10 ENCOUNTER — Ambulatory Visit (INDEPENDENT_AMBULATORY_CARE_PROVIDER_SITE_OTHER): Payer: Medicare Other | Admitting: Obstetrics

## 2014-01-10 ENCOUNTER — Encounter: Payer: Self-pay | Admitting: Obstetrics

## 2014-01-10 VITALS — BP 168/75 | HR 106 | Ht 64.0 in | Wt 269.0 lb

## 2014-01-10 DIAGNOSIS — N950 Postmenopausal bleeding: Secondary | ICD-10-CM

## 2014-01-10 NOTE — Progress Notes (Signed)
Subjective:     Kathleen [name] is a 68 y.o. female here for a routine exam.  Current complaints:Patient is in office today for postmenopausal bleeding. Patient states she is having cramps, and dizziness. Patient states she gets the shakes if she has not eaten. Patient states she has had some brown discharge. Patient denies any itching, irritation, burning, or odor.  Personal health questionnaire reviewed: yes.   Gynecologic History No LMP recorded. Patient is postmenopausal. Contraception: abstinence Last Pap: 2006. Results were: Normal Last mammogram: 2014. Results were: normal  Obstetric History OB History  Gravida Para Term Preterm AB SAB TAB Ectopic Multiple Living  9 7 5 2 2 2  0 0 0 5    # Outcome Date GA Lbr Len/2nd Weight Sex Delivery Anes PTL Lv  9 TRM 1982    F SVD None  Y  8 TRM 07/10/63 [redacted]w[redacted]d   F SVD None  Y  7 TRM 1963   5 lb 4 oz (2.381 kg) F SVD None  Y  6 TRM 02/11/61 [redacted]w[redacted]d   M SVD None  Y  5 TRM 01/21/60 [redacted]w[redacted]d  6 lb 8 oz (2.948 kg) F SVD None  Y  4 SAB 1959        N  3 SAB 1958        N  2 PRE     M SVD   N  1 PRE  [redacted]w[redacted]d   F SVD None  N       The following portions of the patient's history were reviewed and updated as appropriate: allergies, current medications, past family history, past medical history, past social history, past surgical history and problem list.  Review of Systems Pertinent items are noted in HPI.    Objective:    No exam performed today, Consultation only.    Assessment:    Postmenopausal Bleeding  Morbid obesity   Multiple medical problems     Plan:    Education reviewed: Management of postmenopausal bleeding.. Recommend referral to [facility], such as [facility] or [facility] for this problem because of the patient's obesity and multiple medical problems. Thank you for the consultation and I'm sorry that we cannot be of assistance for this very nice lady.

## 2014-03-26 DIAGNOSIS — K219 Gastro-esophageal reflux disease without esophagitis: Secondary | ICD-10-CM | POA: Insufficient documentation

## 2014-03-26 DIAGNOSIS — J45909 Unspecified asthma, uncomplicated: Secondary | ICD-10-CM | POA: Insufficient documentation

## 2014-03-26 DIAGNOSIS — F17200 Nicotine dependence, unspecified, uncomplicated: Secondary | ICD-10-CM | POA: Insufficient documentation

## 2014-04-05 ENCOUNTER — Encounter: Payer: Self-pay | Admitting: *Deleted

## 2014-04-07 ENCOUNTER — Encounter (HOSPITAL_COMMUNITY): Payer: Self-pay | Admitting: Emergency Medicine

## 2014-04-07 ENCOUNTER — Emergency Department (HOSPITAL_COMMUNITY)
Admission: EM | Admit: 2014-04-07 | Discharge: 2014-04-07 | Disposition: A | Discharge status: Home / Self Care | Payer: Medicare Other | Attending: Emergency Medicine | Admitting: Emergency Medicine

## 2014-04-07 ENCOUNTER — Emergency Department (HOSPITAL_COMMUNITY): Payer: Medicare Other

## 2014-04-07 ENCOUNTER — Emergency Department (INDEPENDENT_AMBULATORY_CARE_PROVIDER_SITE_OTHER)
Admission: EM | Admit: 2014-04-07 | Discharge: 2014-04-07 | Disposition: A | Discharge status: Nursing Home (Includes ICF) | Payer: Medicare Other | Source: Home / Self Care

## 2014-04-07 DIAGNOSIS — M79602 Pain in left arm: Secondary | ICD-10-CM

## 2014-04-07 DIAGNOSIS — Z79899 Other long term (current) drug therapy: Secondary | ICD-10-CM | POA: Insufficient documentation

## 2014-04-07 DIAGNOSIS — R079 Chest pain, unspecified: Secondary | ICD-10-CM

## 2014-04-07 DIAGNOSIS — F17200 Nicotine dependence, unspecified, uncomplicated: Secondary | ICD-10-CM | POA: Insufficient documentation

## 2014-04-07 DIAGNOSIS — M129 Arthropathy, unspecified: Secondary | ICD-10-CM | POA: Insufficient documentation

## 2014-04-07 DIAGNOSIS — I10 Essential (primary) hypertension: Secondary | ICD-10-CM | POA: Insufficient documentation

## 2014-04-07 DIAGNOSIS — E119 Type 2 diabetes mellitus without complications: Secondary | ICD-10-CM | POA: Insufficient documentation

## 2014-04-07 DIAGNOSIS — Z880 Allergy status to penicillin: Secondary | ICD-10-CM | POA: Insufficient documentation

## 2014-04-07 DIAGNOSIS — Z7982 Long term (current) use of aspirin: Secondary | ICD-10-CM | POA: Insufficient documentation

## 2014-04-07 DIAGNOSIS — M25519 Pain in unspecified shoulder: Secondary | ICD-10-CM | POA: Insufficient documentation

## 2014-04-07 DIAGNOSIS — I2510 Atherosclerotic heart disease of native coronary artery without angina pectoris: Secondary | ICD-10-CM | POA: Insufficient documentation

## 2014-04-07 LAB — CBC
HCT: 43.1 % (ref 36.0–46.0)
Hemoglobin: 14.6 g/dL (ref 12.0–15.0)
MCH: 29.9 pg (ref 26.0–34.0)
MCHC: 33.9 g/dL (ref 30.0–36.0)
MCV: 88.3 fL (ref 78.0–100.0)
Platelets: 191 K/uL (ref 150–400)
RBC: 4.88 MIL/uL (ref 3.87–5.11)
RDW: 15.2 % (ref 11.5–15.5)
WBC: 10.5 10*3/uL (ref 4.0–10.5)

## 2014-04-07 LAB — BASIC METABOLIC PANEL
BUN: 47 mg/dL — ABNORMAL HIGH (ref 6–23)
CO2: 24 mEq/L (ref 19–32)
Chloride: 100 mEq/L (ref 96–112)
Creatinine, Ser: 1.7 mg/dL — ABNORMAL HIGH (ref 0.50–1.10)
GFR calc Af Amer: 34 mL/min — ABNORMAL LOW (ref >90)

## 2014-04-07 LAB — BASIC METABOLIC PANEL WITH GFR
Calcium: 10.3 mg/dL (ref 8.4–10.5)
GFR calc non Af Amer: 30 mL/min — ABNORMAL LOW (ref >90)
Glucose, Bld: 81 mg/dL (ref 70–99)
Potassium: 4.6 meq/L (ref 3.7–5.3)
Sodium: 138 meq/L (ref 137–147)

## 2014-04-07 LAB — I-STAT TROPONIN, ED
Troponin i, poc: 0.02 ng/mL (ref 0.00–0.08)
Troponin i, poc: 0.02 ng/mL (ref 0.00–0.08)

## 2014-04-07 LAB — PRO B NATRIURETIC PEPTIDE: Pro B Natriuretic peptide (BNP): 1679 pg/mL — ABNORMAL HIGH (ref 0–125)

## 2014-04-07 MED ORDER — NITROGLYCERIN 0.4 MG SL SUBL: 0.4000 mg | SUBLINGUAL_TABLET | SUBLINGUAL | Status: DC | PRN

## 2014-04-07 MED ORDER — NITROGLYCERIN 0.4 MG SL SUBL
SUBLINGUAL_TABLET | SUBLINGUAL | Status: AC
  Filled 2014-04-07: qty 1

## 2014-04-07 MED ORDER — MORPHINE SULFATE 4 MG/ML IJ SOLN
4.0000 mg | Freq: Once | INTRAMUSCULAR | Status: AC
  Administered 2014-04-07: 4 mg via INTRAVENOUS
  Filled 2014-04-07: qty 1

## 2014-04-07 MED ORDER — SODIUM CHLORIDE 0.9 % IV SOLN: INTRAVENOUS | Status: DC

## 2014-04-07 NOTE — Discharge Instructions (Signed)
Followup with your doctor as soon as possible for further evaluation.  You may take Tylenol for pain.  Return to the emergency department if you have new symptoms such as shortness of breath, difficulty breathing, chest pain, or concern for worsening symptoms.

## 2014-04-07 NOTE — ED Provider Notes (Signed)
CSN: [id number]     Arrival date & time 04/07/14  1411 History   None    No chief complaint on file.  (Consider location/radiation/quality/duration/timing/severity/associated sxs/prior Treatment) HPI Comments: Pt with chest pain for 2 days. Did not get it checked because she wanted to go to church this morning. Pain is constant, worse when she moves around, and pt reports it feels like when she previously had cardiac chest pain.   Patient is a 68 y.o. female presenting with chest pain. The history is provided by the patient.  Chest Pain Pain location:  L chest and R chest Pain quality: aching   Pain radiates to:  Upper back and L arm Pain radiates to the back: yes   Pain severity:  Severe Onset quality:  Unable to specify Duration:  2 days Timing:  Constant Progression:  Unchanged Chronicity:  New Relieved by:  None tried Worsened by:  Exertion Ineffective treatments:  None tried Associated symptoms: shortness of breath   Associated symptoms: no abdominal pain, no anxiety, no diaphoresis, no dizziness, no fever, no nausea, no palpitations and not vomiting   Risk factors: coronary artery disease, diabetes mellitus and hypertension     Past Medical History  Diagnosis Date  . Hypertension   . Coronary artery disease     Cypher stent to the RCA in 2003  . Blood transfusion     no side affects  . GERD (gastroesophageal reflux disease)   . Arthritis   . Diabetes mellitus     Borderline   Past Surgical History  Procedure Laterality Date  . Total knee arthroplasty    . Cholecystectomy    . Coronary angioplasty with stent placement     Family History  Problem Relation Age of Onset  . Hypertension Father   . Heart disease Father   . Gout Father   . Arthritis Father    History  Substance Use Topics  . Smoking status: Current Every Day Smoker -- 0.30 packs/day for 30 years    Types: Cigarettes  . Smokeless tobacco: Never Used     Comment: Smoking cessation requested   .  Alcohol Use: No   OB History   Grav Para Term Preterm Abortions TAB SAB Ect Mult Living   9 7 5 2 2  0 2 0 0 5     Review of Systems  Constitutional: Negative for fever and diaphoresis.  Respiratory: Positive for shortness of breath.   Cardiovascular: Positive for chest pain. Negative for palpitations and leg swelling.  Gastrointestinal: Negative for nausea, vomiting and abdominal pain.  Neurological: Negative for dizziness.    Allergies  Penicillins  Home Medications   Prior to Admission medications   Medication Sig Start Date End Date Taking? Authorizing Provider  albuterol (PROVENTIL HFA;VENTOLIN HFA) 108 (90 BASE) MCG/ACT inhaler Inhale 1-2 puffs into the lungs every 6 (six) hours as needed for wheezing or shortness of breath. 10/16/13   Janne Napoleon, NP  allopurinol (ZYLOPRIM) 100 MG tablet Take 100 mg by mouth daily.    Historical Provider, MD  aspirin EC 81 MG tablet Take 81 mg by mouth daily.      Historical Provider, MD  azithromycin (ZITHROMAX) 250 MG tablet Take 1 tablet (250 mg total) by mouth daily. 2 tabs po on day 1, 1 tab po on days 2-5 10/16/13   Janne Napoleon, NP  chlorthalidone (HYGROTON) 25 MG tablet Take 12.5 mg by mouth at bedtime.      Historical Provider, MD  colchicine  0.6 MG tablet Take 2 now and 1 in 1 hour.  May repeat dose once daily.  For gout attack. 01/01/13   Harden Mo, MD  famotidine (PEPCID) 20 MG tablet Take 20 mg by mouth 2 (two) times daily.      Historical Provider, MD  glipiZIDE (GLUCOTROL) 5 MG tablet Take 5 mg by mouth 2 (two) times daily before a meal. On hold for procedure    Historical Provider, MD  lisinopril (PRINIVIL,ZESTRIL) 40 MG tablet Take 1 tablet (40 mg total) by mouth daily. 11/23/11   Minus Breeding, MD  methylPREDNISolone (MEDROL DOSEPAK) 4 MG tablet follow package directions 10/16/13   Janne Napoleon, NP  metoprolol (LOPRESSOR) 50 MG tablet Take 50 mg by mouth 2 (two) times daily.    Historical Provider, MD  pravastatin (PRAVACHOL) 80  MG tablet Take 1 tablet (80 mg total) by mouth daily. 11/23/11 01/10/14  Minus Breeding, MD  predniSONE (DELTASONE) 20 MG tablet Take 1 tablet (20 mg total) by mouth 2 (two) times daily. 01/01/13   Harden Mo, MD  sodium chloride (OCEAN NASAL SPRAY) 0.65 % nasal spray Place 1 spray into the nose as needed for congestion. 09/19/12   Awilda Metro, NP   BP 182/87  Pulse 80  Temp(Src) 98 F (36.7 C) (Oral)  Resp 18  SpO2 95% Physical Exam  Constitutional: She appears well-developed and well-nourished. No distress.  Cardiovascular: Normal rate and regular rhythm.   Pulmonary/Chest: Effort normal and breath sounds normal. She exhibits no tenderness.    ED Course  Procedures (including critical care time) Labs Review Labs Reviewed - No data to display  Imaging Review No results found.   MDM   1. Chest pain   pt took asa this morning. Given nitro, IV, o2 here. Transfer to ER via [facility].  Ekg NSR 67bpm.      Carvel Getting, NP 04/07/14 1438

## 2014-04-07 NOTE — ED Notes (Signed)
Physician assessed and triaged pt before nurse went in. 

## 2014-04-07 NOTE — ED Notes (Signed)
Report given to Eric, RN

## 2014-04-07 NOTE — ED Notes (Signed)
Pt sent here from [facility] for further eval of chest pains x 2 days. Pain radiates into back and left arm, increases with movement. Was given asa and 1 nitro pta, no relief of pain.

## 2014-04-07 NOTE — ED Provider Notes (Signed)
CSN: [id number]     Arrival date & time 04/07/14  1510 History   First MD Initiated Contact with Patient 04/07/14 1616     Chief Complaint  Patient presents with  . Chest Pain     (Consider location/radiation/quality/duration/timing/severity/associated sxs/prior Treatment) The history is provided by the patient.   history of present illness: 68 year-old female with history of coronary artery disease who presents with chief complaint of left arm pain. Onset of symptoms was 3 days ago. Patient reports that the discomfort has been constant but is exacerbated by movement of the left arm and any lifting. Pain is described as radiating from the left shoulder down the left arm. Currently rated moderate in severity. She denies any nausea, diaphoresis, or shortness of breath. Patient initially presented to urgent care today because she was concerned that this pain may be similar to her prior cardiac pain when she required stenting.  Past Medical History  Diagnosis Date  . Hypertension   . Coronary artery disease     Cypher stent to the RCA in 2003  . Blood transfusion     no side affects  . GERD (gastroesophageal reflux disease)   . Arthritis   . Diabetes mellitus     Borderline   Past Surgical History  Procedure Laterality Date  . Total knee arthroplasty    . Cholecystectomy    . Coronary angioplasty with stent placement     Family History  Problem Relation Age of Onset  . Hypertension Father   . Heart disease Father   . Gout Father   . Arthritis Father    History  Substance Use Topics  . Smoking status: Current Every Day Smoker -- 0.30 packs/day for 30 years    Types: Cigarettes  . Smokeless tobacco: Never Used     Comment: Smoking cessation requested   . Alcohol Use: No   OB History   Grav Para Term Preterm Abortions TAB SAB Ect Mult Living   9 7 5 2 2  0 2 0 0 5     Review of Systems  Constitutional: Negative for fever and chills.  HENT: Negative for congestion.    Eyes: Negative for pain.  Respiratory: Negative for shortness of breath.   Cardiovascular: Positive for chest pain. Negative for leg swelling.  Gastrointestinal: Negative for nausea, vomiting, abdominal pain, diarrhea and constipation.  Genitourinary: Negative for dysuria.  Musculoskeletal: Negative for back pain.  Skin: Negative for rash and wound.  Neurological: Negative for headaches.  All other systems reviewed and are negative.     Allergies  Penicillins  Home Medications   Prior to Admission medications   Medication Sig Start Date End Date Taking? Authorizing Provider  albuterol (PROVENTIL HFA;VENTOLIN HFA) 108 (90 BASE) MCG/ACT inhaler Inhale 1-2 puffs into the lungs every 6 (six) hours as needed for wheezing or shortness of breath. 10/16/13   Janne Napoleon, NP  allopurinol (ZYLOPRIM) 100 MG tablet Take 100 mg by mouth daily.    Historical Provider, MD  aspirin EC 81 MG tablet Take 81 mg by mouth daily.      Historical Provider, MD  azithromycin (ZITHROMAX) 250 MG tablet Take 1 tablet (250 mg total) by mouth daily. 2 tabs po on day 1, 1 tab po on days 2-5 10/16/13   Janne Napoleon, NP  chlorthalidone (HYGROTON) 25 MG tablet Take 12.5 mg by mouth at bedtime.      Historical Provider, MD  colchicine 0.6 MG tablet Take 2 now and 1 in  1 hour.  May repeat dose once daily.  For gout attack. 01/01/13   Harden Mo, MD  famotidine (PEPCID) 20 MG tablet Take 20 mg by mouth 2 (two) times daily.      Historical Provider, MD  glipiZIDE (GLUCOTROL) 5 MG tablet Take 5 mg by mouth 2 (two) times daily before a meal. On hold for procedure    Historical Provider, MD  lisinopril (PRINIVIL,ZESTRIL) 40 MG tablet Take 1 tablet (40 mg total) by mouth daily. 11/23/11   Minus Breeding, MD  methylPREDNISolone (MEDROL DOSEPAK) 4 MG tablet follow package directions 10/16/13   Janne Napoleon, NP  metoprolol (LOPRESSOR) 50 MG tablet Take 50 mg by mouth 2 (two) times daily.    Historical Provider, MD  pravastatin  (PRAVACHOL) 80 MG tablet Take 1 tablet (80 mg total) by mouth daily. 11/23/11 01/10/14  Minus Breeding, MD  predniSONE (DELTASONE) 20 MG tablet Take 1 tablet (20 mg total) by mouth 2 (two) times daily. 01/01/13   Harden Mo, MD  sodium chloride (OCEAN NASAL SPRAY) 0.65 % nasal spray Place 1 spray into the nose as needed for congestion. 09/19/12   Carmen L Chatten, NP   BP 138/52  Pulse 58  Resp 14  SpO2 97% Physical Exam  Nursing note and vitals reviewed. Constitutional: She is oriented to person, place, and time. She appears well-developed and well-nourished. No distress.  HENT:  Head: Normocephalic and atraumatic.  Eyes: Conjunctivae are normal.  Neck: Neck supple.  Cardiovascular: Normal rate, regular rhythm, normal heart sounds and intact distal pulses.   Pulmonary/Chest: Effort normal and breath sounds normal. She has no wheezes. She has no rales.  Abdominal: Soft. She exhibits no distension. There is no tenderness.  Musculoskeletal: Normal range of motion.       Left shoulder: She exhibits pain (Reproducible pain with ROM). She exhibits normal range of motion, no tenderness, no swelling and normal pulse.  Neurological: She is alert and oriented to person, place, and time.  Skin: Skin is warm and dry.    ED Course  Procedures (including critical care time) Labs Review Labs Reviewed  BASIC METABOLIC PANEL - Abnormal; Notable for the following:    BUN 47 (*)    Creatinine, Ser 1.70 (*)    GFR calc non Af Amer 30 (*)    GFR calc Af Amer 34 (*)    All other components within normal limits  PRO B NATRIURETIC PEPTIDE - Abnormal; Notable for the following:    Pro B Natriuretic peptide (BNP) 1679.0 (*)    All other components within normal limits  CBC  I-STAT TROPOININ, ED  Randolm Idol, ED    Imaging Review Dg Chest 2 View  04/07/2014   CLINICAL DATA:  Chest pain  EXAM: CHEST  2 VIEW  COMPARISON:  10/16/2013  FINDINGS: Lungs are clear.  No pleural effusion or  pneumothorax.  The heart is normal in size.  Degenerative changes of the visualized thoracolumbar spine.  IMPRESSION: No evidence of acute cardiopulmonary disease.   Electronically Signed   By: Julian Hy M.D.   On: 04/07/2014 17:52    EKG Interpretation:  Date: 04/08/2014  Rate: 62  Rhythm: normal sinus rhythm  QRS Axis: normal  Intervals: normal  ST/T Wave abnormalities: nonspecific ST changes  Conduction Disutrbances:none  Narrative Interpretation: Nonspecific ST changes with otherwise normal EKG  Old EKG Reviewed: unchanged    MDM   Final diagnoses:  Left arm pain    [name]  is a 68 y.o. female with PMSH of hypertension, coronary disease here with 3 days of constant left arm pain exacerbated by any movement or exertion including lifting. She has not had nausea, diaphoresis, or shortness of breath. AF VSS. Exam with very reproducible pain with active and passive range of motion of the left shoulder.  Patient also with tenderness to palpation along the left trapezius muscle. Concern for musculoskeletal etiology for patient's pain. Symptoms not consistent with PE. No signs of infectious etiology. Because patient was concerned it may have been similar to her chest pain she experiencing 2003 cardiac enzymes were obtained. Description of pain and reproducibility on exam would be very atypical for ACS. EKG showed a normal sinus rhythm with normal axis and intervals no evidence of ischemia or infarction. Troponins were negative x2. Patient with recent negative stress test 1.5 years ago.  BNP was obtained as part of triage protocol and was elevated at 1600.  Pt asymptomatic with no dyspnea, able to ambulate w/o difficulty, normal CXR.  No signs of acute CHF exacerbation.    As pain is very atypical for ACS, has been constant for the past 3 days, is reproducible on exam, with a normal EKG and negative cardiac enzymes feel patient appropriate for discharge home with close PCP  followup. Treatment plan was discussed with the patient who is in agreement and strong return precautions were given for any worsening of symptoms.  Renaldo Reel, MD 04/08/14 [phone]

## 2014-04-07 NOTE — ED Notes (Signed)
Patient transported to X-ray 

## 2014-04-07 NOTE — ED Notes (Signed)
MD at bedside. 

## 2014-04-08 ENCOUNTER — Encounter (HOSPITAL_COMMUNITY): Payer: Self-pay | Admitting: Emergency Medicine

## 2014-04-08 NOTE — ED Provider Notes (Signed)
I saw and evaluated the patient, reviewed the resident's note and I agree with the findings and plan.   EKG Interpretation   Date/Time:  Sunday April 07 2014 15:11:18 EDT Ventricular Rate:  62 PR Interval:  175 QRS Duration: 85 QT Interval:  441 QTC Calculation: 448 R Axis:   31 Text Interpretation:  Sinus rhythm Anterior infarct, old No significant  change since last tracing Abnormal ekg Confirmed by [facility]  MD, Hoover Browns  ([id number]) on 04/08/2014 1:49:30 PM      Atypical pain, generally just LUE.  Had been present for 3 days, reproducible with motion of shoulder.  Troponin times 2 negative with no change in ECG.  Pt stable to be discharged and follow up with PCP and/or cardiology this week.  Return precautions discussed.    Saddie Benders. Johnryan Sao, MD 04/08/14 1351

## 2014-04-10 NOTE — ED Provider Notes (Signed)
Medical screening examination/treatment/procedure(s) were performed by a resident physician or non-physician practitioner and as the supervising physician I was immediately available for consultation/collaboration.  Lynne Leader, MD    Gregor Hams, MD 04/10/14 [phone]

## 2014-04-29 ENCOUNTER — Encounter: Payer: Medicare Other | Admitting: Obstetrics & Gynecology

## 2014-06-19 ENCOUNTER — Emergency Department (INDEPENDENT_AMBULATORY_CARE_PROVIDER_SITE_OTHER)
Admission: EM | Admit: 2014-06-19 | Discharge: 2014-06-19 | Disposition: A | Discharge status: Home / Self Care | Payer: Medicare Other | Source: Home / Self Care | Attending: Family Medicine | Admitting: Family Medicine

## 2014-06-19 ENCOUNTER — Encounter (HOSPITAL_COMMUNITY): Payer: Self-pay | Admitting: Emergency Medicine

## 2014-06-19 DIAGNOSIS — I10 Essential (primary) hypertension: Secondary | ICD-10-CM

## 2014-06-19 MED ORDER — CHLORTHALIDONE 25 MG PO TABS: 25.0000 mg | ORAL_TABLET | Freq: Every day | ORAL | Status: DC

## 2014-06-19 MED ORDER — GLIPIZIDE 5 MG PO TABS: 5.0000 mg | ORAL_TABLET | Freq: Two times a day (BID) | ORAL | Status: DC

## 2014-06-19 MED ORDER — METOPROLOL TARTRATE 50 MG PO TABS: 50.0000 mg | ORAL_TABLET | Freq: Two times a day (BID) | ORAL | Status: DC

## 2014-06-19 NOTE — Discharge Instructions (Signed)
Take medicine as prescribed and see your doctor for further refills.

## 2014-06-19 NOTE — ED Provider Notes (Signed)
CSN: [id number]     Arrival date & time 06/19/14  1340 History   First MD Initiated Contact with Patient 06/19/14 1522     Chief Complaint  Patient presents with  . Medication Refill   (Consider location/radiation/quality/duration/timing/severity/associated sxs/prior Treatment) Patient is a 68 y.o. female presenting with hypertension. The history is provided by the patient.  Hypertension This is a chronic problem. Episode onset: out of bp meds for 2 d, doctor not available to refill. Pertinent negatives include no chest pain, no abdominal pain, no headaches and no shortness of breath.    Past Medical History  Diagnosis Date  . Hypertension   . Coronary artery disease     Cypher stent to the RCA in 2003  . Blood transfusion     no side affects  . GERD (gastroesophageal reflux disease)   . Arthritis   . Diabetes mellitus     Borderline   Past Surgical History  Procedure Laterality Date  . Total knee arthroplasty    . Cholecystectomy    . Coronary angioplasty with stent placement     Family History  Problem Relation Age of Onset  . Hypertension Father   . Heart disease Father   . Gout Father   . Arthritis Father    History  Substance Use Topics  . Smoking status: Current Every Day Smoker -- 0.30 packs/day for 30 years    Types: Cigarettes  . Smokeless tobacco: Never Used     Comment: Smoking cessation requested   . Alcohol Use: No   OB History   Grav Para Term Preterm Abortions TAB SAB Ect Mult Living   9 7 5 2 2  0 2 0 0 5     Review of Systems  Constitutional: Negative.   Respiratory: Negative for shortness of breath.   Cardiovascular: Negative for chest pain, palpitations and leg swelling.  Gastrointestinal: Negative for abdominal pain.  Neurological: Negative for headaches.    Allergies  Penicillins  Home Medications   Prior to Admission medications   Medication Sig Start Date End Date Taking? Authorizing Provider  albuterol (PROVENTIL HFA;VENTOLIN  HFA) 108 (90 BASE) MCG/ACT inhaler Inhale 1-2 puffs into the lungs every 6 (six) hours as needed for wheezing or shortness of breath. 10/16/13   Janne Napoleon, NP  allopurinol (ZYLOPRIM) 100 MG tablet Take 100 mg by mouth daily.    Historical Provider, MD  aspirin EC 81 MG tablet Take 81 mg by mouth daily.      Historical Provider, MD  azithromycin (ZITHROMAX) 250 MG tablet Take 1 tablet (250 mg total) by mouth daily. 2 tabs po on day 1, 1 tab po on days 2-5 10/16/13   Janne Napoleon, NP  chlorthalidone (HYGROTON) 25 MG tablet Take 12.5 mg by mouth at bedtime.      Historical Provider, MD  chlorthalidone (HYGROTON) 25 MG tablet Take 1 tablet (25 mg total) by mouth daily. 06/19/14   Billy Fischer, MD  colchicine 0.6 MG tablet Take 2 now and 1 in 1 hour.  May repeat dose once daily.  For gout attack. 01/01/13   Harden Mo, MD  famotidine (PEPCID) 20 MG tablet Take 20 mg by mouth 2 (two) times daily.      Historical Provider, MD  glipiZIDE (GLUCOTROL) 5 MG tablet Take 5 mg by mouth 2 (two) times daily before a meal. On hold for procedure    Historical Provider, MD  glipiZIDE (GLUCOTROL) 5 MG tablet Take 1 tablet (5 mg total)  by mouth 2 (two) times daily before a meal. 06/19/14   Billy Fischer, MD  lisinopril (PRINIVIL,ZESTRIL) 40 MG tablet Take 1 tablet (40 mg total) by mouth daily. 11/23/11   Minus Breeding, MD  methylPREDNISolone (MEDROL DOSEPAK) 4 MG tablet follow package directions 10/16/13   Janne Napoleon, NP  metoprolol (LOPRESSOR) 50 MG tablet Take 50 mg by mouth 2 (two) times daily.    Historical Provider, MD  metoprolol (LOPRESSOR) 50 MG tablet Take 1 tablet (50 mg total) by mouth 2 (two) times daily. 06/19/14   Billy Fischer, MD  pravastatin (PRAVACHOL) 80 MG tablet Take 1 tablet (80 mg total) by mouth daily. 11/23/11 01/10/14  Minus Breeding, MD  predniSONE (DELTASONE) 20 MG tablet Take 1 tablet (20 mg total) by mouth 2 (two) times daily. 01/01/13   Harden Mo, MD  sodium chloride (OCEAN NASAL SPRAY) 0.65  % nasal spray Place 1 spray into the nose as needed for congestion. 09/19/12   Carmen L Chatten, NP   BP 200/80  Pulse 68  Temp(Src) 98 F (36.7 C) (Oral)  Resp 18  SpO2 96% Physical Exam  Nursing note and vitals reviewed. Constitutional: She is oriented to person, place, and time. She appears well-developed and well-nourished.  Cardiovascular: Normal heart sounds.   Musculoskeletal: She exhibits no edema.  Neurological: She is alert and oriented to person, place, and time.  Skin: Skin is warm and dry.    ED Course  Procedures (including critical care time) Labs Review Labs Reviewed - No data to display  Imaging Review No results found.   MDM   1. Hypertension goal BP (blood pressure) < 140/80        Billy Fischer, MD 06/19/14 1536

## 2014-06-19 NOTE — ED Notes (Signed)
C/o medication refill  States PCP has not contacted pharmacy

## 2014-07-18 ENCOUNTER — Encounter (HOSPITAL_COMMUNITY): Payer: Self-pay | Admitting: Emergency Medicine

## 2014-07-18 ENCOUNTER — Emergency Department (INDEPENDENT_AMBULATORY_CARE_PROVIDER_SITE_OTHER)
Admission: EM | Admit: 2014-07-18 | Discharge: 2014-07-18 | Disposition: A | Discharge status: Home / Self Care | Payer: Medicare Other | Source: Home / Self Care | Attending: Family Medicine | Admitting: Family Medicine

## 2014-07-18 DIAGNOSIS — R197 Diarrhea, unspecified: Secondary | ICD-10-CM

## 2014-07-18 NOTE — ED Notes (Signed)
Pt  Reports  Symptoms  Of  Diarrhea         X  sev  Weeks   denys  Any other  Symptoms  She  Is  Sitting  Upright on the  Exam table        She  Is  Speaking in  Complete  sentances         And  Is  In no  Acute  Distress     She  Has  Not  Tried  Any meds    Only  Crackers  To  Relieve  The  Symptoms

## 2014-07-18 NOTE — Discharge Instructions (Signed)
Purchase Imodium and take a dose or two according to the package instructions. Try to give your stomach a rest by eating the diet for diarrhea. Follow up with your doctor if this does not fix the problem.

## 2014-07-18 NOTE — ED Provider Notes (Signed)
CSN: [id number]     Arrival date & time 07/18/14  [id number] History   First MD Initiated Contact with Patient 07/18/14 0935     Chief Complaint  Patient presents with  . Diarrhea   (Consider location/radiation/quality/duration/timing/severity/associated sxs/prior Treatment) HPI Comments: Pt reports drinking chocolate milk 2 weeks ago and then diarrhea episodes started after and have not resolved.  colonscopy this past spring was normal. No recent antibiotics.   Patient is a 68 y.o. female presenting with diarrhea. The history is provided by the patient.  Diarrhea Quality:  Watery and semi-solid Severity:  Mild Onset quality:  Sudden Number of episodes:  Every time she uses bathroom Duration:  2 weeks Timing:  Intermittent Progression:  Unchanged Relieved by:  None tried Worsened by:  Nothing tried Ineffective treatments:  Change in diet (eliminated milk and increased crackers) Associated symptoms: no abdominal pain, no chills, no fever and no vomiting   Risk factors: suspect food intake   Risk factors: no recent antibiotic use, no sick contacts and no travel to endemic areas     Past Medical History  Diagnosis Date  . Hypertension   . Coronary artery disease     Cypher stent to the RCA in 2003  . Blood transfusion     no side affects  . GERD (gastroesophageal reflux disease)   . Arthritis   . Diabetes mellitus     Borderline   Past Surgical History  Procedure Laterality Date  . Total knee arthroplasty    . Cholecystectomy    . Coronary angioplasty with stent placement     Family History  Problem Relation Age of Onset  . Hypertension Father   . Heart disease Father   . Gout Father   . Arthritis Father    History  Substance Use Topics  . Smoking status: Current Every Day Smoker -- 0.30 packs/day for 30 years    Types: Cigarettes  . Smokeless tobacco: Never Used     Comment: Smoking cessation requested   . Alcohol Use: No   OB History   Grav Para Term Preterm  Abortions TAB SAB Ect Mult Living   9 7 5 2 2  0 2 0 0 5     Review of Systems  Constitutional: Negative for fever and chills.  Gastrointestinal: Positive for diarrhea. Negative for nausea, vomiting, abdominal pain, blood in stool and abdominal distention.    Allergies  Penicillins  Home Medications   Prior to Admission medications   Medication Sig Start Date End Date Taking? Authorizing Provider  albuterol (PROVENTIL HFA;VENTOLIN HFA) 108 (90 BASE) MCG/ACT inhaler Inhale 1-2 puffs into the lungs every 6 (six) hours as needed for wheezing or shortness of breath. 10/16/13   Janne Napoleon, NP  allopurinol (ZYLOPRIM) 100 MG tablet Take 100 mg by mouth daily.    Historical Provider, MD  aspirin EC 81 MG tablet Take 81 mg by mouth daily.      Historical Provider, MD  azithromycin (ZITHROMAX) 250 MG tablet Take 1 tablet (250 mg total) by mouth daily. 2 tabs po on day 1, 1 tab po on days 2-5 10/16/13   Janne Napoleon, NP  chlorthalidone (HYGROTON) 25 MG tablet Take 12.5 mg by mouth at bedtime.      Historical Provider, MD  chlorthalidone (HYGROTON) 25 MG tablet Take 1 tablet (25 mg total) by mouth daily. 06/19/14   Billy Fischer, MD  colchicine 0.6 MG tablet Take 2 now and 1 in 1 hour.  May repeat dose  once daily.  For gout attack. 01/01/13   Harden Mo, MD  famotidine (PEPCID) 20 MG tablet Take 20 mg by mouth 2 (two) times daily.      Historical Provider, MD  glipiZIDE (GLUCOTROL) 5 MG tablet Take 5 mg by mouth 2 (two) times daily before a meal. On hold for procedure    Historical Provider, MD  glipiZIDE (GLUCOTROL) 5 MG tablet Take 1 tablet (5 mg total) by mouth 2 (two) times daily before a meal. 06/19/14   Billy Fischer, MD  lisinopril (PRINIVIL,ZESTRIL) 40 MG tablet Take 1 tablet (40 mg total) by mouth daily. 11/23/11   Minus Breeding, MD  methylPREDNISolone (MEDROL DOSEPAK) 4 MG tablet follow package directions 10/16/13   Janne Napoleon, NP  metoprolol (LOPRESSOR) 50 MG tablet Take 50 mg by mouth 2 (two)  times daily.    Historical Provider, MD  metoprolol (LOPRESSOR) 50 MG tablet Take 1 tablet (50 mg total) by mouth 2 (two) times daily. 06/19/14   Billy Fischer, MD  pravastatin (PRAVACHOL) 80 MG tablet Take 1 tablet (80 mg total) by mouth daily. 11/23/11 01/10/14  Minus Breeding, MD  predniSONE (DELTASONE) 20 MG tablet Take 1 tablet (20 mg total) by mouth 2 (two) times daily. 01/01/13   Harden Mo, MD  sodium chloride (OCEAN NASAL SPRAY) 0.65 % nasal spray Place 1 spray into the nose as needed for congestion. 09/19/12   Awilda Metro, NP   BP 156/94  Pulse 63  Temp(Src) 97.6 F (36.4 C) (Oral)  Resp 18  SpO2 100% Physical Exam  Constitutional: She appears well-developed and well-nourished. No distress.  Well-hydrated  Cardiovascular: Normal rate and regular rhythm.   Pulmonary/Chest: Effort normal and breath sounds normal.  Abdominal: Soft. Normal appearance and bowel sounds are normal. She exhibits no distension. There is no tenderness. There is no rigidity, no rebound and no guarding.  abd obese    ED Course  Procedures (including critical care time) Labs Review Labs Reviewed - No data to display  Imaging Review No results found.   MDM   1. Diarrhea   no red flags. Suggested imodium x1-2 doses, given diet for diarrhea handout. F/u with pcp if no improvement of sx.      Carvel Getting, NP 07/18/14 1017

## 2014-07-19 NOTE — ED Provider Notes (Signed)
Medical screening examination/treatment/procedure(s) were performed by a resident physician or non-physician practitioner and as the supervising physician I was immediately available for consultation/collaboration.  Lynne Leader, MD    Gregor Hams, MD 07/19/14 [phone]

## 2014-09-17 ENCOUNTER — Encounter (HOSPITAL_COMMUNITY): Payer: Self-pay | Admitting: Emergency Medicine

## 2014-09-17 ENCOUNTER — Emergency Department (HOSPITAL_COMMUNITY): Payer: PRIVATE HEALTH INSURANCE

## 2014-09-17 ENCOUNTER — Inpatient Hospital Stay (HOSPITAL_COMMUNITY)
Admission: EM | Admit: 2014-09-17 | Discharge: 2014-09-19 | DRG: 292 | Disposition: A | Discharge status: Home / Self Care | Payer: PRIVATE HEALTH INSURANCE | Attending: Internal Medicine | Admitting: Internal Medicine

## 2014-09-17 DIAGNOSIS — Z7982 Long term (current) use of aspirin: Secondary | ICD-10-CM | POA: Diagnosis not present

## 2014-09-17 DIAGNOSIS — N189 Chronic kidney disease, unspecified: Secondary | ICD-10-CM | POA: Diagnosis present

## 2014-09-17 DIAGNOSIS — I129 Hypertensive chronic kidney disease with stage 1 through stage 4 chronic kidney disease, or unspecified chronic kidney disease: Secondary | ICD-10-CM | POA: Diagnosis present

## 2014-09-17 DIAGNOSIS — E875 Hyperkalemia: Secondary | ICD-10-CM | POA: Diagnosis present

## 2014-09-17 DIAGNOSIS — I6529 Occlusion and stenosis of unspecified carotid artery: Secondary | ICD-10-CM | POA: Diagnosis present

## 2014-09-17 DIAGNOSIS — E785 Hyperlipidemia, unspecified: Secondary | ICD-10-CM | POA: Diagnosis present

## 2014-09-17 DIAGNOSIS — E0821 Diabetes mellitus due to underlying condition with diabetic nephropathy: Secondary | ICD-10-CM

## 2014-09-17 DIAGNOSIS — K219 Gastro-esophageal reflux disease without esophagitis: Secondary | ICD-10-CM | POA: Diagnosis present

## 2014-09-17 DIAGNOSIS — N183 Chronic kidney disease, stage 3 unspecified: Secondary | ICD-10-CM

## 2014-09-17 DIAGNOSIS — Z8249 Family history of ischemic heart disease and other diseases of the circulatory system: Secondary | ICD-10-CM | POA: Diagnosis not present

## 2014-09-17 DIAGNOSIS — Z79899 Other long term (current) drug therapy: Secondary | ICD-10-CM

## 2014-09-17 DIAGNOSIS — I5033 Acute on chronic diastolic (congestive) heart failure: Principal | ICD-10-CM | POA: Diagnosis present

## 2014-09-17 DIAGNOSIS — Z880 Allergy status to penicillin: Secondary | ICD-10-CM

## 2014-09-17 DIAGNOSIS — E119 Type 2 diabetes mellitus without complications: Secondary | ICD-10-CM

## 2014-09-17 DIAGNOSIS — I2510 Atherosclerotic heart disease of native coronary artery without angina pectoris: Secondary | ICD-10-CM | POA: Diagnosis present

## 2014-09-17 DIAGNOSIS — Z6841 Body Mass Index (BMI) 40.0 and over, adult: Secondary | ICD-10-CM

## 2014-09-17 DIAGNOSIS — Z955 Presence of coronary angioplasty implant and graft: Secondary | ICD-10-CM | POA: Diagnosis not present

## 2014-09-17 DIAGNOSIS — I509 Heart failure, unspecified: Secondary | ICD-10-CM

## 2014-09-17 DIAGNOSIS — T465X5A Adverse effect of other antihypertensive drugs, initial encounter: Secondary | ICD-10-CM | POA: Diagnosis present

## 2014-09-17 DIAGNOSIS — Z96659 Presence of unspecified artificial knee joint: Secondary | ICD-10-CM | POA: Diagnosis present

## 2014-09-17 DIAGNOSIS — I359 Nonrheumatic aortic valve disorder, unspecified: Secondary | ICD-10-CM

## 2014-09-17 DIAGNOSIS — F17200 Nicotine dependence, unspecified, uncomplicated: Secondary | ICD-10-CM

## 2014-09-17 DIAGNOSIS — I10 Essential (primary) hypertension: Secondary | ICD-10-CM | POA: Diagnosis present

## 2014-09-17 DIAGNOSIS — F17210 Nicotine dependence, cigarettes, uncomplicated: Secondary | ICD-10-CM | POA: Diagnosis present

## 2014-09-17 HISTORY — DX: Shortness of breath: R06.02

## 2014-09-17 HISTORY — DX: Heart failure, unspecified: I50.9

## 2014-09-17 LAB — CBC
HCT: 41.8 % (ref 36.0–46.0)
Hemoglobin: 13.6 g/dL (ref 12.0–15.0)
MCH: 28.3 pg (ref 26.0–34.0)
MCHC: 32.5 g/dL (ref 30.0–36.0)
MCV: 87.1 fL (ref 78.0–100.0)
Platelets: 194 10*3/uL (ref 150–400)
RBC: 4.8 MIL/uL (ref 3.87–5.11)
RDW: 15.3 % (ref 11.5–15.5)
WBC: 12.8 10*3/uL — ABNORMAL HIGH (ref 4.0–10.5)

## 2014-09-17 LAB — COMPREHENSIVE METABOLIC PANEL
ALT: 22 U/L (ref 0–35)
AST: 18 U/L (ref 0–37)
Albumin: 3.2 g/dL — ABNORMAL LOW (ref 3.5–5.2)
Alkaline Phosphatase: 98 U/L (ref 39–117)
Anion gap: 12 (ref 5–15)
BUN: 27 mg/dL — ABNORMAL HIGH (ref 6–23)
CO2: 26 mEq/L (ref 19–32)
Calcium: 9.5 mg/dL (ref 8.4–10.5)
Chloride: 104 mEq/L (ref 96–112)
Creatinine, Ser: 1.5 mg/dL — ABNORMAL HIGH (ref 0.50–1.10)
GFR calc Af Amer: 40 mL/min — ABNORMAL LOW (ref >90)
GFR calc non Af Amer: 34 mL/min — ABNORMAL LOW (ref >90)
Glucose, Bld: 133 mg/dL — ABNORMAL HIGH (ref 70–99)
Potassium: 4.5 mEq/L (ref 3.7–5.3)
Sodium: 142 mEq/L (ref 137–147)
Total Bilirubin: 0.4 mg/dL (ref 0.3–1.2)
Total Protein: 8.1 g/dL (ref 6.0–8.3)

## 2014-09-17 LAB — TROPONIN I
Troponin I: <0.3 ng/mL (ref <0.30)
Troponin I: <0.3 ng/mL (ref <0.30)

## 2014-09-17 LAB — GLUCOSE, CAPILLARY
GLUCOSE-CAPILLARY: 129 mg/dL — AB (ref 70–99)
Glucose-Capillary: 132 mg/dL — ABNORMAL HIGH (ref 70–99)
Glucose-Capillary: 147 mg/dL — ABNORMAL HIGH (ref 70–99)

## 2014-09-17 LAB — I-STAT TROPONIN, ED: Troponin i, poc: 0.03 ng/mL (ref 0.00–0.08)

## 2014-09-17 LAB — PRO B NATRIURETIC PEPTIDE: Pro B Natriuretic peptide (BNP): 4469 pg/mL — ABNORMAL HIGH (ref 0–125)

## 2014-09-17 LAB — TSH: TSH: 1.46 u[IU]/mL (ref 0.350–4.500)

## 2014-09-17 MED ORDER — FUROSEMIDE 10 MG/ML IJ SOLN
20.0000 mg | Freq: Once | INTRAMUSCULAR | Status: AC
  Administered 2014-09-17: 20 mg via INTRAVENOUS
  Filled 2014-09-17: qty 2

## 2014-09-17 MED ORDER — FAMOTIDINE 20 MG PO TABS
20.0000 mg | ORAL_TABLET | Freq: Every day | ORAL | Status: DC
  Administered 2014-09-17 – 2014-09-18 (×2): 20 mg via ORAL
  Filled 2014-09-17 (×3): qty 1

## 2014-09-17 MED ORDER — ACETAMINOPHEN 325 MG PO TABS
650.0000 mg | ORAL_TABLET | ORAL | Status: DC | PRN
Start: 2014-09-17 — End: 2014-09-19
  Administered 2014-09-17 – 2014-09-19 (×2): 650 mg via ORAL
  Filled 2014-09-17 (×2): qty 2

## 2014-09-17 MED ORDER — SODIUM CHLORIDE 0.9 % IV SOLN: 250.0000 mL | INTRAVENOUS | Status: DC | PRN

## 2014-09-17 MED ORDER — FUROSEMIDE 10 MG/ML IJ SOLN
40.0000 mg | Freq: Every day | INTRAMUSCULAR | Status: DC
  Administered 2014-09-17: 40 mg via INTRAVENOUS
  Filled 2014-09-17: qty 4

## 2014-09-17 MED ORDER — ALLOPURINOL 100 MG PO TABS
100.0000 mg | ORAL_TABLET | Freq: Every day | ORAL | Status: DC
  Administered 2014-09-17 – 2014-09-19 (×3): 100 mg via ORAL
  Filled 2014-09-17 (×3): qty 1

## 2014-09-17 MED ORDER — ALBUTEROL SULFATE (2.5 MG/3ML) 0.083% IN NEBU
3.0000 mL | INHALATION_SOLUTION | Freq: Four times a day (QID) | RESPIRATORY_TRACT | Status: DC | PRN
  Administered 2014-09-18: 3 mL via RESPIRATORY_TRACT
  Filled 2014-09-17: qty 3

## 2014-09-17 MED ORDER — ISOSORB DINITRATE-HYDRALAZINE 20-37.5 MG PO TABS
1.0000 | ORAL_TABLET | Freq: Three times a day (TID) | ORAL | Status: DC
  Administered 2014-09-17 – 2014-09-19 (×7): 1 via ORAL
  Filled 2014-09-17 (×9): qty 1

## 2014-09-17 MED ORDER — METOPROLOL TARTRATE 50 MG PO TABS
50.0000 mg | ORAL_TABLET | Freq: Two times a day (BID) | ORAL | Status: DC
  Administered 2014-09-17 – 2014-09-18 (×4): 50 mg via ORAL
  Filled 2014-09-17 (×6): qty 1

## 2014-09-17 MED ORDER — HEPARIN SODIUM (PORCINE) 5000 UNIT/ML IJ SOLN
5000.0000 [IU] | Freq: Three times a day (TID) | INTRAMUSCULAR | Status: DC
  Administered 2014-09-17 – 2014-09-19 (×7): 5000 [IU] via SUBCUTANEOUS
  Filled 2014-09-17 (×7): qty 1

## 2014-09-17 MED ORDER — INSULIN ASPART 100 UNIT/ML ~~LOC~~ SOLN
0.0000 [IU] | Freq: Three times a day (TID) | SUBCUTANEOUS | Status: DC
  Administered 2014-09-17 (×2): 2 [IU] via SUBCUTANEOUS
  Administered 2014-09-18 (×2): 3 [IU] via SUBCUTANEOUS
  Administered 2014-09-19: 5 [IU] via SUBCUTANEOUS

## 2014-09-17 MED ORDER — ALBUTEROL SULFATE (2.5 MG/3ML) 0.083% IN NEBU
5.0000 mg | INHALATION_SOLUTION | Freq: Once | RESPIRATORY_TRACT | Status: AC
  Administered 2014-09-17: 5 mg via RESPIRATORY_TRACT

## 2014-09-17 MED ORDER — ONDANSETRON HCL 4 MG/2ML IJ SOLN: 4.0000 mg | Freq: Four times a day (QID) | INTRAMUSCULAR | Status: DC | PRN

## 2014-09-17 MED ORDER — HYDRALAZINE HCL 20 MG/ML IJ SOLN
5.0000 mg | Freq: Four times a day (QID) | INTRAMUSCULAR | Status: DC | PRN
  Administered 2014-09-17: 5 mg via INTRAVENOUS
  Filled 2014-09-17: qty 1

## 2014-09-17 MED ORDER — CHLORTHALIDONE 25 MG PO TABS
25.0000 mg | ORAL_TABLET | Freq: Every day | ORAL | Status: DC
  Administered 2014-09-17: 25 mg via ORAL
  Filled 2014-09-17 (×2): qty 1

## 2014-09-17 MED ORDER — PRAVASTATIN SODIUM 80 MG PO TABS
80.0000 mg | ORAL_TABLET | Freq: Every day | ORAL | Status: DC
  Administered 2014-09-17 – 2014-09-18 (×2): 80 mg via ORAL
  Filled 2014-09-17 (×3): qty 1

## 2014-09-17 MED ORDER — SODIUM CHLORIDE 0.9 % IJ SOLN: 3.0000 mL | INTRAMUSCULAR | Status: DC | PRN

## 2014-09-17 MED ORDER — IPRATROPIUM BROMIDE 0.02 % IN SOLN
0.5000 mg | Freq: Once | RESPIRATORY_TRACT | Status: AC
  Administered 2014-09-17: 0.5 mg via RESPIRATORY_TRACT

## 2014-09-17 MED ORDER — SODIUM CHLORIDE 0.9 % IJ SOLN
3.0000 mL | Freq: Two times a day (BID) | INTRAMUSCULAR | Status: DC
  Administered 2014-09-17 – 2014-09-19 (×5): 3 mL via INTRAVENOUS

## 2014-09-17 MED ORDER — ASPIRIN EC 81 MG PO TBEC
81.0000 mg | DELAYED_RELEASE_TABLET | Freq: Every day | ORAL | Status: DC
  Administered 2014-09-17 – 2014-09-19 (×3): 81 mg via ORAL
  Filled 2014-09-17 (×3): qty 1

## 2014-09-17 NOTE — ED Notes (Signed)
PA at BS.  

## 2014-09-17 NOTE — ED Notes (Signed)
Report attempted 

## 2014-09-17 NOTE — Progress Notes (Signed)
  Echocardiogram 2D Echocardiogram has been performed.  Jesusmanuel Erbes FRANCES 09/17/2014, 1:14 PM

## 2014-09-17 NOTE — H&P (Signed)
[facility] History and Physical  Kathleen [name] [id number] DOB: 1946-06-28 DOA: 09/17/2014  Referring physician: PA: Charlann Lange PCP: Philis Fendt, MD   Chief Complaint: SOB, DOE  HPI: Kathleen [name] is a 68 y.o. female  With history of diabetes mellitus, hypertension, hyperlipidemia, coronary artery disease status post stent, carotid stenosis. Presents to the ED complaining of shortness of breath times one day. The problem has been persistent since onset. Nothing she is aware of makes it better. It is worsened with activity.  Given the persistence in symptoms patient decided to come to the ED for further evaluation and recommendations.  While in the ED was found to have elevated BNP and chest x-ray suspicious for CHF.   Review of Systems:  Constitutional:  No weight loss, night sweats, Fevers, chills, fatigue.  HEENT:  No headaches, Difficulty swallowing,Tooth/dental problems,Sore throat,  No sneezing, itching, ear ache, nasal congestion, post nasal drip,  Cardio-vascular:  No chest pain, Orthopnea, PND, swelling in lower extremities, anasarca, dizziness, palpitations  GI:  No heartburn, indigestion, abdominal pain, nausea, vomiting, diarrhea, change in bowel habits, loss of appetite  Resp:  + shortness of breath with exertion or at rest. No excess mucus, no productive cough, No non-productive cough, No coughing up of blood.No change in color of mucus.No wheezing.No chest wall deformity  Skin:  no rash or lesions.  GU:  no dysuria, change in color of urine, no urgency or frequency. No flank pain.  Musculoskeletal:  No joint pain or swelling. No decreased range of motion. No back pain.  Psych:  No change in mood or affect. No depression or anxiety. No memory loss.   Past Medical History  Diagnosis Date  . Hypertension   . Coronary artery disease     Cypher stent to the RCA in 2003  . Blood transfusion     no side affects  . GERD (gastroesophageal reflux  disease)   . Arthritis   . Diabetes mellitus     Borderline   Past Surgical History  Procedure Laterality Date  . Total knee arthroplasty    . Cholecystectomy    . Coronary angioplasty with stent placement     Social History:  reports that she has been smoking Cigarettes.  She has a 9 pack-year smoking history. She has never used smokeless tobacco. She reports that she does not drink alcohol or use illicit drugs.  Allergies  Allergen Reactions  . Penicillins Other (See Comments)    unknown    Family History  Problem Relation Age of Onset  . Hypertension Father   . Heart disease Father   . Gout Father   . Arthritis Father      Prior to Admission medications   Medication Sig Start Date End Date Taking? Authorizing Provider  albuterol (PROVENTIL HFA;VENTOLIN HFA) 108 (90 BASE) MCG/ACT inhaler Inhale 1-2 puffs into the lungs every 6 (six) hours as needed for wheezing or shortness of breath. 10/16/13  Yes Janne Napoleon, NP  allopurinol (ZYLOPRIM) 100 MG tablet Take 100 mg by mouth daily.   Yes Historical Provider, MD  aspirin EC 81 MG tablet Take 81 mg by mouth daily.     Yes Historical Provider, MD  chlorthalidone (HYGROTON) 25 MG tablet Take 1 tablet (25 mg total) by mouth daily. 06/19/14  Yes Billy Fischer, MD  famotidine (PEPCID) 20 MG tablet Take 20 mg by mouth at bedtime.    Yes Historical Provider, MD  glipiZIDE (GLUCOTROL) 5 MG tablet Take 1 tablet (  5 mg total) by mouth 2 (two) times daily before a meal. 06/19/14  Yes Billy Fischer, MD  metoprolol (LOPRESSOR) 50 MG tablet Take 1 tablet (50 mg total) by mouth 2 (two) times daily. 06/19/14  Yes Billy Fischer, MD  pravastatin (PRAVACHOL) 80 MG tablet Take 1 tablet (80 mg total) by mouth daily. 11/23/11 09/17/14 Yes Minus Breeding, MD  telmisartan (MICARDIS) 80 MG tablet Take 80 mg by mouth daily.   Yes Historical Provider, MD   Physical Exam: Filed Vitals:   09/17/14 0724 09/17/14 0740 09/17/14 0800 09/17/14 0900  BP:  165/56 190/76  172/60  Pulse:  101 95 106  Temp:      TempSrc:      Resp:  20 32   Height:      Weight:      SpO2: 95% 96% 96% 96%    Wt Readings from Last 3 Encounters:  09/17/14 123.832 kg (273 lb)  01/10/14 122.018 kg (269 lb)  10/25/12 122.29 kg (269 lb 9.6 oz)    General:  Appears calm and comfortable Eyes: PERRL, normal lids, irises & conjunctiva ENT: grossly normal hearing, lips & tongue Neck: no LAD, masses or thyromegaly Cardiovascular: RRR, no m/r/g. No LE edema. Respiratory: Increased respiratory rate, rales at bases, no wheezes Abdomen: soft, nt, nd, obese Skin: no rash or induration seen on limited exam Musculoskeletal: grossly normal tone BUE/BLE Psychiatric: grossly normal mood and affect, speech fluent and appropriate Neurologic: grossly non-focal.          Labs on Admission:  Basic Metabolic Panel:  Recent Labs Lab 09/17/14 0539  NA 142  K 4.5  CL 104  CO2 26  GLUCOSE 133*  BUN 27*  CREATININE 1.50*  CALCIUM 9.5   Liver Function Tests:  Recent Labs Lab 09/17/14 0539  AST 18  ALT 22  ALKPHOS 98  BILITOT 0.4  PROT 8.1  ALBUMIN 3.2*   No results found for this basename: LIPASE, AMYLASE,  in the last 168 hours No results found for this basename: AMMONIA,  in the last 168 hours CBC:  Recent Labs Lab 09/17/14 0539  WBC 12.8*  HGB 13.6  HCT 41.8  MCV 87.1  PLT 194   Cardiac Enzymes: No results found for this basename: CKTOTAL, CKMB, CKMBINDEX, TROPONINI,  in the last 168 hours  BNP (last 3 results)  Recent Labs  04/07/14 1609 09/17/14 0544  PROBNP 1679.0* 4469.0*   CBG: No results found for this basename: GLUCAP,  in the last 168 hours  Radiological Exams on Admission: Dg Chest 2 View  09/17/2014   CLINICAL DATA:  Acute onset of shortness of breath and cough. Left-sided chest pain. Initial encounter.  EXAM: CHEST  2 VIEW  COMPARISON:  Chest radiograph performed 04/07/2014  FINDINGS: The lungs are well-aerated. Vascular congestion is  noted, with increased interstitial markings, raising concern for mild interstitial edema. There is no evidence of pleural effusion or pneumothorax.  The heart is mildly enlarged. No acute osseous abnormalities are seen.  IMPRESSION: Vascular congestion and mild cardiomegaly, with increased interstitial markings, raising concern for mild interstitial edema.   Electronically Signed   By: Garald Balding M.D.   On: 09/17/2014 06:41    EKG: Independently reviewed. Sinus rhythm with no ST elevations or depressions  Assessment/Plan Principal Problem:   Acute congestive heart failure - Chest x-ray reporting vascular congestion and mild cardiomegaly with increased interstitial markings, elevated BNP of 4469. - Monitor on telemetry - Place on Lasix 40  mg IV daily - Strict intake and output monitoring, daily weights - Troponins q. 6 hours x3 - EKG next a.m. - Last echocardiogram was in 2005 as such will repeat this admission, at that point patient had reported abnormality in left ventricle relaxation. Otherwise ejection fraction intact - Added Bidil - held ARB due to elevated Serum creatinine   Active Problems:   Hyperlipidemia with target LDL less than 70 - Continue statin    Essential hypertension - Bidil and coreg on board    Coronary atherosclerosis - aspirin and statin on board    Diabetes mellitus - Will hold oral hypoglycemic agent, place on sliding scale insulin -Diabetic diet    Chronic kidney disease -Hold nephrotoxic agent ARB   Code Status:full DVT Prophylaxis:heparin Family Communication:  Disposition Plan:   Time spent: > 35 minutes  [name] [facility] Pager [phone]

## 2014-09-17 NOTE — ED Notes (Signed)
Pt has returned from the bathroom without any difficulty or distress; pt placed back on monitor, continuous pulse oximetry and blood pressure cuff; pt resting and daughter at bedside

## 2014-09-17 NOTE — ED Notes (Signed)
Pt up ambulatory to the bathroom at this time with assistance from her daughter

## 2014-09-17 NOTE — ED Notes (Signed)
Patient transported to X-ray 

## 2014-09-17 NOTE — ED Notes (Signed)
Hypertension medication requested from pharmacy.

## 2014-09-17 NOTE — ED Provider Notes (Signed)
Patient presents for CHF symptoms.  She has SOB, DOE, and sleep oprthopnea.  She has no BLE swelling.  Her only symptoms that has gotten worse is SOB while at rest.  She is only on chorthalidone for a diuretic and she may need lasix therapy.  WIll admit for cardiac work up and CHF treatment.  Medical screening examination/treatment/procedure(s) were conducted as a shared visit with non-physician practitioner(s) and myself.  I personally evaluated the patient during the encounter.   EKG Interpretation   Date/Time:  Tuesday September 17 2014 05:37:40 EDT Ventricular Rate:  85 PR Interval:  158 QRS Duration: 80 QT Interval:  397 QTC Calculation: 472 R Axis:   74 Text Interpretation:  Sinus rhythm Atrial premature complex Anterior  infarct, old Baseline wander in lead(s) II III aVF No significant change  since last tracing Confirmed by Glynn Octave [phone]) on 09/17/2014  6:48:46 AM        Everlene Balls, MD 09/17/14 [id number]

## 2014-09-17 NOTE — ED Notes (Signed)
Pt reports she woke up this morning at 0030, pt reports she felt SOB and started having chest pain in her lower left chest. PT reports dizziness and lightheadedness too and a non productive cough that started this past evening as well. Pt is A&O X4. Pt believes she has had a fever.

## 2014-09-17 NOTE — ED Provider Notes (Signed)
CSN: [id number]     Arrival date & time 09/17/14  0527 History   First MD Initiated Contact with Patient 09/17/14 0617     Chief Complaint  Patient presents with  . Shortness of Breath  . Chest Pain     (Consider location/radiation/quality/duration/timing/severity/associated sxs/prior Treatment) Patient is a 68 y.o. female presenting with shortness of breath and chest pain. The history is provided by the patient. No language interpreter was used.  Shortness of Breath Severity:  Moderate Onset quality:  Gradual Timing:  Constant Associated symptoms: chest pain   Associated symptoms: no abdominal pain, no cough, no fever, no vomiting and no wheezing   Associated symptoms comment:  The patient complains of shortness of breath that started last night. She reports a largely normal day yesterday without symptoms. She notes some mid-lower chest discomfort along with discomfort across the upper abdomen. No cough or known fever. She denies use of oxygen at home. She has an inhaler but has not been on nebulized albuterol at home. She states she quit smoking in the recent pain. No nausea or vomiting. Chest Pain Associated symptoms: shortness of breath   Associated symptoms: no abdominal pain, no cough, no fever, no nausea and not vomiting     Past Medical History  Diagnosis Date  . Hypertension   . Coronary artery disease     Cypher stent to the RCA in 2003  . Blood transfusion     no side affects  . GERD (gastroesophageal reflux disease)   . Arthritis   . Diabetes mellitus     Borderline   Past Surgical History  Procedure Laterality Date  . Total knee arthroplasty    . Cholecystectomy    . Coronary angioplasty with stent placement     Family History  Problem Relation Age of Onset  . Hypertension Father   . Heart disease Father   . Gout Father   . Arthritis Father    History  Substance Use Topics  . Smoking status: Current Every Day Smoker -- 0.30 packs/day for 30 years   Types: Cigarettes  . Smokeless tobacco: Never Used     Comment: Smoking cessation requested   . Alcohol Use: No   OB History   Grav Para Term Preterm Abortions TAB SAB Ect Mult Living   9 7 5 2 2  0 2 0 0 5     Review of Systems  Constitutional: Negative for fever and chills.  HENT: Negative.   Respiratory: Positive for shortness of breath. Negative for cough and wheezing.   Cardiovascular: Positive for chest pain. Negative for leg swelling.  Gastrointestinal: Negative.  Negative for nausea, vomiting and abdominal pain.  Musculoskeletal: Negative.  Negative for myalgias.  Skin: Negative.   Neurological: Negative.  Negative for syncope and light-headedness.      Allergies  Penicillins  Home Medications   Prior to Admission medications   Medication Sig Start Date End Date Taking? Authorizing Provider  albuterol (PROVENTIL HFA;VENTOLIN HFA) 108 (90 BASE) MCG/ACT inhaler Inhale 1-2 puffs into the lungs every 6 (six) hours as needed for wheezing or shortness of breath. 10/16/13  Yes Janne Napoleon, NP  allopurinol (ZYLOPRIM) 100 MG tablet Take 100 mg by mouth daily.   Yes Historical Provider, MD  aspirin EC 81 MG tablet Take 81 mg by mouth daily.     Yes Historical Provider, MD  chlorthalidone (HYGROTON) 25 MG tablet Take 1 tablet (25 mg total) by mouth daily. 06/19/14  Yes Billy Fischer, MD  famotidine (PEPCID) 20 MG tablet Take 20 mg by mouth at bedtime.    Yes Historical Provider, MD  glipiZIDE (GLUCOTROL) 5 MG tablet Take 1 tablet (5 mg total) by mouth 2 (two) times daily before a meal. 06/19/14  Yes Billy Fischer, MD  metoprolol (LOPRESSOR) 50 MG tablet Take 1 tablet (50 mg total) by mouth 2 (two) times daily. 06/19/14  Yes Billy Fischer, MD  pravastatin (PRAVACHOL) 80 MG tablet Take 1 tablet (80 mg total) by mouth daily. 11/23/11 09/17/14 Yes Minus Breeding, MD  telmisartan (MICARDIS) 80 MG tablet Take 80 mg by mouth daily.   Yes Historical Provider, MD   BP 172/76  Pulse 82  Temp(Src)  98.3 F (36.8 C) (Oral)  Resp 26  Ht 5\' 4"  (1.626 m)  Wt 273 lb (123.832 kg)  BMI 46.84 kg/m2  SpO2 95% Physical Exam  Constitutional: She is oriented to person, place, and time. She appears well-developed and well-nourished.  HENT:  Head: Normocephalic.  Neck: Normal range of motion. Neck supple.  Cardiovascular: Normal rate and regular rhythm.   Right carotid bruit.  Pulmonary/Chest: Effort normal. She has no wheezes. She has rales. She exhibits no tenderness.  Abdominal: Soft. Bowel sounds are normal. There is no tenderness. There is no rebound and no guarding.  Musculoskeletal: Normal range of motion.  Neurological: She is alert and oriented to person, place, and time.  Skin: Skin is warm and dry. No rash noted.  Psychiatric: She has a normal mood and affect.    ED Course  Procedures (including critical care time) Labs Review Labs Reviewed  COMPREHENSIVE METABOLIC PANEL - Abnormal; Notable for the following:    Glucose, Bld 133 (*)    BUN 27 (*)    Creatinine, Ser 1.50 (*)    Albumin 3.2 (*)    GFR calc non Af Amer 34 (*)    GFR calc Af Amer 40 (*)    All other components within normal limits  CBC - Abnormal; Notable for the following:    WBC 12.8 (*)    All other components within normal limits  PRO B NATRIURETIC PEPTIDE - Abnormal; Notable for the following:    Pro B Natriuretic peptide (BNP) 4469.0 (*)    All other components within normal limits  I-STAT TROPOININ, ED   Results for orders placed during the hospital encounter of 09/17/14  COMPREHENSIVE METABOLIC PANEL      Result Value Ref Range   Sodium 142  137 - 147 mEq/L   Potassium 4.5  3.7 - 5.3 mEq/L   Chloride 104  96 - 112 mEq/L   CO2 26  19 - 32 mEq/L   Glucose, Bld 133 (*) 70 - 99 mg/dL   BUN 27 (*) 6 - 23 mg/dL   Creatinine, Ser 1.50 (*) 0.50 - 1.10 mg/dL   Calcium 9.5  8.4 - 10.5 mg/dL   Total Protein 8.1  6.0 - 8.3 g/dL   Albumin 3.2 (*) 3.5 - 5.2 g/dL   AST 18  0 - 37 U/L   ALT 22  0 - 35  U/L   Alkaline Phosphatase 98  39 - 117 U/L   Total Bilirubin 0.4  0.3 - 1.2 mg/dL   GFR calc non Af Amer 34 (*) >90 mL/min   GFR calc Af Amer 40 (*) >90 mL/min   Anion gap 12  5 - 15  CBC      Result Value Ref Range   WBC 12.8 (*) 4.0 -  10.5 K/uL   RBC 4.80  3.87 - 5.11 MIL/uL   Hemoglobin 13.6  12.0 - 15.0 g/dL   HCT 41.8  36.0 - 46.0 %   MCV 87.1  78.0 - 100.0 fL   MCH 28.3  26.0 - 34.0 pg   MCHC 32.5  30.0 - 36.0 g/dL   RDW 15.3  11.5 - 15.5 %   Platelets 194  150 - 400 K/uL  PRO B NATRIURETIC PEPTIDE      Result Value Ref Range   Pro B Natriuretic peptide (BNP) 4469.0 (*) 0 - 125 pg/mL  I-STAT TROPOININ, ED      Result Value Ref Range   Troponin i, poc 0.03  0.00 - 0.08 ng/mL   Comment 3            Dg Chest 2 View  09/17/2014   CLINICAL DATA:  Acute onset of shortness of breath and cough. Left-sided chest pain. Initial encounter.  EXAM: CHEST  2 VIEW  COMPARISON:  Chest radiograph performed 04/07/2014  FINDINGS: The lungs are well-aerated. Vascular congestion is noted, with increased interstitial markings, raising concern for mild interstitial edema. There is no evidence of pleural effusion or pneumothorax.  The heart is mildly enlarged. No acute osseous abnormalities are seen.  IMPRESSION: Vascular congestion and mild cardiomegaly, with increased interstitial markings, raising concern for mild interstitial edema.   Electronically Signed   By: Garald Balding M.D.   On: 09/17/2014 06:41   Imaging Review Dg Chest 2 View  09/17/2014   CLINICAL DATA:  Acute onset of shortness of breath and cough. Left-sided chest pain. Initial encounter.  EXAM: CHEST  2 VIEW  COMPARISON:  Chest radiograph performed 04/07/2014  FINDINGS: The lungs are well-aerated. Vascular congestion is noted, with increased interstitial markings, raising concern for mild interstitial edema. There is no evidence of pleural effusion or pneumothorax.  The heart is mildly enlarged. No acute osseous abnormalities are seen.   IMPRESSION: Vascular congestion and mild cardiomegaly, with increased interstitial markings, raising concern for mild interstitial edema.   Electronically Signed   By: Garald Balding M.D.   On: 09/17/2014 06:41     EKG Interpretation   Date/Time:  Tuesday September 17 2014 05:37:40 EDT Ventricular Rate:  85 PR Interval:  158 QRS Duration: 80 QT Interval:  397 QTC Calculation: 472 R Axis:   74 Text Interpretation:  Sinus rhythm Atrial premature complex Anterior  infarct, old Baseline wander in lead(s) II III aVF No significant change  since last tracing Confirmed by Glynn Octave [phone]) on 09/17/2014  6:48:46 AM      MDM   Final diagnoses:  None    1. CHF  Presenting with acutely worsening SOB, vascular congestion, elevated BNP, orthopneic at baseline. No EKG changes, normal troponin - doubt acute ischemia. Minimal relief with albuterol/atrovent. Will admit for further management and stabilization.     Dewaine Oats, PA-C 09/17/14 [phone]

## 2014-09-17 NOTE — Progress Notes (Signed)
UR completed Merina Behrendt K. Jessah Danser, RN, BSN, [place], CCM  09/17/2014 12:35 PM

## 2014-09-18 DIAGNOSIS — I2510 Atherosclerotic heart disease of native coronary artery without angina pectoris: Secondary | ICD-10-CM

## 2014-09-18 DIAGNOSIS — I10 Essential (primary) hypertension: Secondary | ICD-10-CM

## 2014-09-18 DIAGNOSIS — N183 Chronic kidney disease, stage 3 (moderate): Secondary | ICD-10-CM

## 2014-09-18 DIAGNOSIS — E785 Hyperlipidemia, unspecified: Secondary | ICD-10-CM

## 2014-09-18 DIAGNOSIS — E0821 Diabetes mellitus due to underlying condition with diabetic nephropathy: Secondary | ICD-10-CM

## 2014-09-18 LAB — MAGNESIUM: MAGNESIUM: 1.6 mg/dL (ref 1.5–2.5)

## 2014-09-18 LAB — BASIC METABOLIC PANEL
Anion gap: 13 (ref 5–15)
BUN: 33 mg/dL — ABNORMAL HIGH (ref 6–23)
CO2: 22 meq/L (ref 19–32)
Calcium: 9 mg/dL (ref 8.4–10.5)
Chloride: 101 mEq/L (ref 96–112)
Creatinine, Ser: 1.62 mg/dL — ABNORMAL HIGH (ref 0.50–1.10)
GFR calc Af Amer: 36 mL/min — ABNORMAL LOW (ref >90)
GFR calc non Af Amer: 31 mL/min — ABNORMAL LOW (ref >90)
GLUCOSE: 86 mg/dL (ref 70–99)
POTASSIUM: 6.2 meq/L — AB (ref 3.7–5.3)
SODIUM: 136 meq/L — AB (ref 137–147)

## 2014-09-18 LAB — CBC
HEMATOCRIT: 38.4 % (ref 36.0–46.0)
HEMOGLOBIN: 12.6 g/dL (ref 12.0–15.0)
MCH: 28.3 pg (ref 26.0–34.0)
MCHC: 32.8 g/dL (ref 30.0–36.0)
MCV: 86.1 fL (ref 78.0–100.0)
Platelets: 164 10*3/uL (ref 150–400)
RBC: 4.46 MIL/uL (ref 3.87–5.11)
RDW: 15.2 % (ref 11.5–15.5)
WBC: 10.5 10*3/uL (ref 4.0–10.5)

## 2014-09-18 LAB — GLUCOSE, CAPILLARY
GLUCOSE-CAPILLARY: 182 mg/dL — AB (ref 70–99)
GLUCOSE-CAPILLARY: 106 mg/dL — AB (ref 70–99)
Glucose-Capillary: 91 mg/dL (ref 70–99)
Glucose-Capillary: 158 mg/dL — ABNORMAL HIGH (ref 70–99)

## 2014-09-18 LAB — TROPONIN I

## 2014-09-18 LAB — POTASSIUM: Potassium: 3.9 mEq/L (ref 3.7–5.3)

## 2014-09-18 MED ORDER — METOPROLOL TARTRATE 1 MG/ML IV SOLN
5.0000 mg | Freq: Once | INTRAVENOUS | Status: AC
  Administered 2014-09-18: 5 mg via INTRAVENOUS
  Filled 2014-09-18: qty 5

## 2014-09-18 MED ORDER — FUROSEMIDE 10 MG/ML IJ SOLN
40.0000 mg | Freq: Two times a day (BID) | INTRAMUSCULAR | Status: DC
  Administered 2014-09-18 – 2014-09-19 (×3): 40 mg via INTRAVENOUS
  Filled 2014-09-18 (×3): qty 4

## 2014-09-18 MED ORDER — SODIUM POLYSTYRENE SULFONATE 15 GM/60ML PO SUSP
30.0000 g | Freq: Once | ORAL | Status: AC
  Administered 2014-09-18: 30 g via ORAL
  Filled 2014-09-18: qty 120

## 2014-09-18 NOTE — Significant Event (Signed)
PATIENT O2 SAT AT 97% ON RA . NO C/O NOTED WILL CONTINUE TO MONITOR PATIENT.

## 2014-09-18 NOTE — Progress Notes (Signed)
[facility] PROGRESS NOTE Interim History: 68 y.o. female With history of diabetes mellitus, hypertension, hyperlipidemia, coronary artery disease status post stent, carotid stenosis. Presents to the ED complaining of shortness of breath times one day. The problem has been persistent since onset. Nothing she is aware of makes it better. It is worsened with activity. Given the persistence in symptoms patient decided to come to the ED for further evaluation and recommendations.   Filed Weights   09/17/14 0537 09/17/14 1017 09/18/14 0443  Weight: 123.832 kg (273 lb) 123.152 kg (271 lb 8 oz) 122.38 kg (269 lb 12.8 oz)        Intake/Output Summary (Last 24 hours) at 09/18/14 2210 Last data filed at 09/18/14 2049  Gross per 24 hour  Intake    960 ml  Output   1753 ml  Net   -793 ml     Assessment/Plan: Acute on chronic diastolic congestive heart failure: EF preserved (60-65%). -due to diet indiscretion -continue low sodium diet -continue IV lasix X 1 more day -follow closely  Daily weights, intake and ouput -patient breathing easier -continue b-blocker  Chronic kidney disease: essentially stable and at baseline -will monitor closely, as can get worse with diuresis   Diabetes mellitus: continue SSI -will check A1C -continue low carb diet  Coronary atherosclerosis: denies CP. -troponin neg X3 -2-D echo with no wall motion abnormalities -will continue ASA, statins and b-blockers  Essential hypertension: fairly well controlled. -continue current regimen  Hyperlipidemia with target LDL less than 70 -continue statins  Hyperkalemia: due to ARB most likely -ARB discontinued -kayexalate given -repeat potassium WNL -will monitor    Code Status:Full Family Communication: no family at bedside Disposition Plan: home when medically stable   Consultants:  None   Procedures: ECHO: Study Conclusions - Left ventricle: The cavity size was normal. Wall thickness  was increased in a pattern of mild LVH. The estimated ejection fraction was 60%. Wall motion was normal; there were no regional wall motion abnormalities. - Aortic valve: Sclerosis without stenosis. There was mild regurgitation. - Mitral valve: Calcified annulus. - Left atrium: The atrium was mildly dilated. - Right ventricle: The cavity size was normal. Systolic function was normal.    Antibiotics:  None   HPI/Subjective: Feeling better, breathing easier. Denies CP. No fever.  Objective: Filed Vitals:   09/17/14 2358 09/18/14 0443 09/18/14 1300 09/18/14 2051  BP: 119/49 121/69 97/47 131/71  Pulse: 67 71 72 114  Temp:  98 F (36.7 C) 98.6 F (37 C) 97.9 F (36.6 C)  TempSrc:  Oral Oral Oral  Resp: 18 18 18 18   Height:      Weight:  122.38 kg (269 lb 12.8 oz)    SpO2: 100% 100% 100% 100%     Exam:  General: Alert, awake, oriented x3, in no acute distress. Feeling better and breathing easier  HEENT: No bruits, no goiter. No JVD appreciated on exam (patient is obese and difficult to evaluate due to body habitus) Heart: Regular rate, no rubs or gallops; trace edema bilaterally Lungs: improve air movement, no wheezing; bibasilar fine crackles appreciated on exam Abdomen: Soft, nontender, nondistended, positive bowel sounds.  Neuro: Grossly intact, nonfocal.   Data Reviewed: Basic Metabolic Panel:  Recent Labs Lab 09/17/14 0539 09/18/14 0602 09/18/14 1315  NA 142 136*  --   K 4.5 6.2* 3.9  CL 104 101  --   CO2 26 22  --   GLUCOSE 133* 86  --   BUN 27*  33*  --   CREATININE 1.50* 1.62*  --   CALCIUM 9.5 9.0  --   MG  --  1.6  --    Liver Function Tests:  Recent Labs Lab 09/17/14 0539  AST 18  ALT 22  ALKPHOS 98  BILITOT 0.4  PROT 8.1  ALBUMIN 3.2*   CBC:  Recent Labs Lab 09/17/14 0539 09/18/14 0815  WBC 12.8* 10.5  HGB 13.6 12.6  HCT 41.8 38.4  MCV 87.1 86.1  PLT 194 164   Cardiac Enzymes:  Recent Labs Lab 09/17/14 1618  09/17/14 2215 09/18/14 0602  TROPONINI <0.30 <0.30 <0.30   BNP (last 3 results)  Recent Labs  04/07/14 1609 09/17/14 0544  PROBNP 1679.0* 4469.0*   CBG:  Recent Labs Lab 09/17/14 2121 09/18/14 0653 09/18/14 1049 09/18/14 1646 09/18/14 2111  GLUCAP 129* 91 182* 158* 106*    Studies: Dg Chest 2 View  09/17/2014   CLINICAL DATA:  Acute onset of shortness of breath and cough. Left-sided chest pain. Initial encounter.  EXAM: CHEST  2 VIEW  COMPARISON:  Chest radiograph performed 04/07/2014  FINDINGS: The lungs are well-aerated. Vascular congestion is noted, with increased interstitial markings, raising concern for mild interstitial edema. There is no evidence of pleural effusion or pneumothorax.  The heart is mildly enlarged. No acute osseous abnormalities are seen.  IMPRESSION: Vascular congestion and mild cardiomegaly, with increased interstitial markings, raising concern for mild interstitial edema.   Electronically Signed   By: Garald Balding M.D.   On: 09/17/2014 06:41    Scheduled Meds: . allopurinol  100 mg Oral Daily  . aspirin EC  81 mg Oral Daily  . famotidine  20 mg Oral QHS  . furosemide  40 mg Intravenous BID  . heparin  5,000 Units Subcutaneous 3 times per day  . insulin aspart  0-15 Units Subcutaneous TID WC  . isosorbide-hydrALAZINE  1 tablet Oral TID  . metoprolol  50 mg Oral BID  . pravastatin  80 mg Oral q1800  . sodium chloride  3 mL Intravenous Q12H   Continuous Infusions:   Time: >30 minutes  Barton Dubois  [facility] Pager [phone]. If 8PM-8AM, please contact night-coverage at [email or web address], password [email or web address] 09/18/2014, 10:10 PM  LOS: 1 day

## 2014-09-18 NOTE — Significant Event (Signed)
Dr Dyann Kief sent msg K+ lvl 6.2. wiil continue to monitor patient .

## 2014-09-19 DIAGNOSIS — Z720 Tobacco use: Secondary | ICD-10-CM

## 2014-09-19 DIAGNOSIS — I5033 Acute on chronic diastolic (congestive) heart failure: Principal | ICD-10-CM

## 2014-09-19 LAB — BASIC METABOLIC PANEL
Anion gap: 15 (ref 5–15)
BUN: 38 mg/dL — ABNORMAL HIGH (ref 6–23)
CHLORIDE: 101 meq/L (ref 96–112)
CO2: 23 mEq/L (ref 19–32)
Calcium: 8.9 mg/dL (ref 8.4–10.5)
Creatinine, Ser: 1.9 mg/dL — ABNORMAL HIGH (ref 0.50–1.10)
GFR calc non Af Amer: 26 mL/min — ABNORMAL LOW (ref >90)
GFR, EST AFRICAN AMERICAN: 30 mL/min — AB (ref >90)
Glucose, Bld: 93 mg/dL (ref 70–99)
POTASSIUM: 3.9 meq/L (ref 3.7–5.3)
SODIUM: 139 meq/L (ref 137–147)

## 2014-09-19 LAB — GLUCOSE, CAPILLARY
Glucose-Capillary: 105 mg/dL — ABNORMAL HIGH (ref 70–99)
Glucose-Capillary: 236 mg/dL — ABNORMAL HIGH (ref 70–99)

## 2014-09-19 LAB — HEMOGLOBIN A1C
HEMOGLOBIN A1C: 6.7 % — AB (ref <5.7)
Mean Plasma Glucose: 146 mg/dL — ABNORMAL HIGH (ref <117)

## 2014-09-19 MED ORDER — METOPROLOL TARTRATE 100 MG PO TABS
100.0000 mg | ORAL_TABLET | Freq: Two times a day (BID) | ORAL | Status: DC
  Administered 2014-09-19: 100 mg via ORAL
  Filled 2014-09-19 (×2): qty 1

## 2014-09-19 MED ORDER — FUROSEMIDE 40 MG PO TABS: 60.0000 mg | ORAL_TABLET | Freq: Every day | ORAL | Status: DC

## 2014-09-19 MED ORDER — ISOSORB DINITRATE-HYDRALAZINE 20-37.5 MG PO TABS: 1.0000 | ORAL_TABLET | Freq: Three times a day (TID) | ORAL | Status: DC

## 2014-09-19 MED ORDER — TELMISARTAN 40 MG PO TABS: 40.0000 mg | ORAL_TABLET | Freq: Every day | ORAL | Status: DC

## 2014-09-19 MED ORDER — METOPROLOL TARTRATE 100 MG PO TABS
100.0000 mg | ORAL_TABLET | Freq: Two times a day (BID) | ORAL | Status: DC
Start: 2014-09-19 — End: 2015-01-28

## 2014-09-19 MED ORDER — FUROSEMIDE 40 MG PO TABS
40.0000 mg | ORAL_TABLET | Freq: Two times a day (BID) | ORAL | Status: DC
  Filled 2014-09-19 (×3): qty 1

## 2014-09-19 NOTE — Progress Notes (Signed)
Patient discharged home. Discharged instructions completed. Med list reviewed. Patient verbalizes understanding.

## 2014-09-19 NOTE — Progress Notes (Signed)
Pt's HR in the 130's-140's non sustained, CCMD called and inform RN that pt's rhythm is in atrial flutter, EKG confirmed Atrial Flutter. Lamar Blinks NP notified, metoprolol 5mg  IV given, 10 minutes after, pt's HR down in the 70's-80's, pt resting in bed no c/o pain. EKG done, pt back in SR. Will continue to monitor.  Kandie Keiper, Blanch Media, RN

## 2014-09-19 NOTE — Discharge Summary (Signed)
Physician Discharge Summary  Kathleen [name] [id number] DOB: October 17, 1946 DOA: 09/17/2014  PCP: Philis Fendt, MD  Admit date: 09/17/2014 Discharge date: 09/19/2014  Time spent: >30 minutes  Recommendations for Outpatient Follow-up:  Check BMET to follow electrolytes and renal function Reassess BP and adjust medications as needed  BNP    Component Value Date/Time   PROBNP 4469.0* 09/17/2014 0544   Filed Weights   09/17/14 1017 09/18/14 0443 09/19/14 0601  Weight: 123.152 kg (271 lb 8 oz) 122.38 kg (269 lb 12.8 oz) 122.018 kg (269 lb)     Discharge Diagnoses:    Acute on chronic diastolic congestive heart failure   Hyperlipidemia with target LDL less than 70   Essential hypertension   Coronary atherosclerosis   Diabetes mellitus   Chronic kidney disease stage 3   Morbid obesity   Discharge Condition: stable and improved. Will discharge home and patient will follow with PCP in 1 week.  Diet recommendation: low sodium, low calorie and low carbohydrates    History of present illness:  68 y.o. female With history of diabetes mellitus, hypertension, hyperlipidemia, coronary artery disease status post stent, carotid stenosis. Presents to the ED complaining of shortness of breath times one day. The problem has been persistent since onset. Nothing she is aware of makes it better. It is worsened with activity. Given the persistence in symptoms patient decided to come to the ED for further evaluation and treatment.   Hospital Course:  Acute on chronic diastolic congestive heart failure: EF preserved (60-65%).  -due to diet indiscretion most likely. Education and goal/target sodium intake provided.  -continue low sodium diet  -will discharge on lasix 60mg  daily  -follow closely Daily weights and fluid restriction  -patient breathing easier  -continue b-blocker   Chronic kidney disease stage 3: essentially stable and at baseline  -will monitor closely, as can get worse  with diuresis  -BMET during follow up visit needed  Diabetes mellitus: continue home medication regimen  -A1C 6.7 -continue low carb diet   Coronary atherosclerosis: denies CP.  -troponin neg X3  -2-D echo with no wall motion abnormalities  -will continue ASA, statins and b-blockers   Essential hypertension: fairly well controlled.  -continue current regimen   Hyperlipidemia with target LDL less than 70  -continue statins  -follow up to LFT's and lipid panel in outpatient setting  Hyperkalemia: due to ARB most likely  -ARB initially discontinued and once K WNL, resumed at lower dose -will follow BMET in outpatient setting  Morbid obesity: BMI > 40 -exercise and low calorie diet discussed with patient  Procedures: ECHO: Study Conclusions - Left ventricle: The cavity size was normal. Wall thickness was increased in a pattern of mild LVH. The estimated ejection fraction was 60%. Wall motion was normal; there were no regional wall motion abnormalities. - Aortic valve: Sclerosis without stenosis. There was mild regurgitation. - Mitral valve: Calcified annulus. - Left atrium: The atrium was mildly dilated. - Right ventricle: The cavity size was normal. Systolic function was normal.  Consultations:  None   Discharge Exam: Filed Vitals:   09/19/14 1026  BP: 140/56  Pulse:   Temp:   Resp:    General: Alert, awake, oriented x3, in no acute distress. Feeling better and breathing easier  HEENT: No bruits, no goiter. No JVD appreciated on exam (patient is obese and difficult to evaluate due to body habitus)  Heart: Regular rate, no rubs or gallops; no edema bilaterally  Lungs: improved air movement, no  wheezing; no crackles appreciated on exam  Abdomen: Soft, nontender, nondistended, positive bowel sounds.  Neuro: Grossly intact, nonfocal.   Discharge Instructions  Discharge Instructions   Diet - low sodium heart healthy    Complete by:  As directed      Discharge  instructions    Complete by:  As directed   Follow a low sodium and low carbohydrates diet Take medications as prescribed Follow with PCP in 1 week Check weight on daily basis Increase exercise and lose weight Stop smoking            Medication List    STOP taking these medications       chlorthalidone 25 MG tablet  Commonly known as:  HYGROTON      TAKE these medications       albuterol 108 (90 BASE) MCG/ACT inhaler  Commonly known as:  PROVENTIL HFA;VENTOLIN HFA  Inhale 1-2 puffs into the lungs every 6 (six) hours as needed for wheezing or shortness of breath.     allopurinol 100 MG tablet  Commonly known as:  ZYLOPRIM  Take 100 mg by mouth daily.     aspirin EC 81 MG tablet  Take 81 mg by mouth daily.     famotidine 20 MG tablet  Commonly known as:  PEPCID  Take 20 mg by mouth at bedtime.     furosemide 40 MG tablet  Commonly known as:  LASIX  Take 1.5 tablets (60 mg total) by mouth daily.     glipiZIDE 5 MG tablet  Commonly known as:  GLUCOTROL  Take 1 tablet (5 mg total) by mouth 2 (two) times daily before a meal.     isosorbide-hydrALAZINE 20-37.5 MG per tablet  Commonly known as:  BIDIL  Take 1 tablet by mouth 3 (three) times daily.     metoprolol 100 MG tablet  Commonly known as:  LOPRESSOR  Take 1 tablet (100 mg total) by mouth 2 (two) times daily.     pravastatin 80 MG tablet  Commonly known as:  PRAVACHOL  Take 1 tablet (80 mg total) by mouth daily.     telmisartan 40 MG tablet  Commonly known as:  MICARDIS  Take 1 tablet (40 mg total) by mouth daily.       Allergies  Allergen Reactions  . Penicillins Other (See Comments)    unknown       Follow-up Information   Follow up with AVBUERE,EDWIN A, MD. Schedule an appointment as soon as possible for a visit in 1 week.   Specialty:  Internal Medicine   Contact information:   [place] [address] [phone]       The results of significant diagnostics from this  hospitalization (including imaging, microbiology, ancillary and laboratory) are listed below for reference.    Significant Diagnostic Studies: Dg Chest 2 View  09/17/2014   CLINICAL DATA:  Acute onset of shortness of breath and cough. Left-sided chest pain. Initial encounter.  EXAM: CHEST  2 VIEW  COMPARISON:  Chest radiograph performed 04/07/2014  FINDINGS: The lungs are well-aerated. Vascular congestion is noted, with increased interstitial markings, raising concern for mild interstitial edema. There is no evidence of pleural effusion or pneumothorax.  The heart is mildly enlarged. No acute osseous abnormalities are seen.  IMPRESSION: Vascular congestion and mild cardiomegaly, with increased interstitial markings, raising concern for mild interstitial edema.   Electronically Signed   By: Garald Balding M.D.   On: 09/17/2014 06:41   Labs:  Basic Metabolic Panel:  Recent Labs Lab 09/17/14 0539 09/18/14 0602 09/18/14 1315 09/19/14 0516  NA 142 136*  --  139  K 4.5 6.2* 3.9 3.9  CL 104 101  --  101  CO2 26 22  --  23  GLUCOSE 133* 86  --  93  BUN 27* 33*  --  38*  CREATININE 1.50* 1.62*  --  1.90*  CALCIUM 9.5 9.0  --  8.9  MG  --  1.6  --   --    Liver Function Tests:  Recent Labs Lab 09/17/14 0539  AST 18  ALT 22  ALKPHOS 98  BILITOT 0.4  PROT 8.1  ALBUMIN 3.2*   CBC:  Recent Labs Lab 09/17/14 0539 09/18/14 0815  WBC 12.8* 10.5  HGB 13.6 12.6  HCT 41.8 38.4  MCV 87.1 86.1  PLT 194 164   Cardiac Enzymes:  Recent Labs Lab 09/17/14 1618 09/17/14 2215 09/18/14 0602  TROPONINI <0.30 <0.30 <0.30   BNP: BNP (last 3 results)  Recent Labs  04/07/14 1609 09/17/14 0544  PROBNP 1679.0* 4469.0*   CBG:  Recent Labs Lab 09/18/14 1049 09/18/14 1646 09/18/14 2111 09/19/14 0605 09/19/14 1055  GLUCAP 182* 158* 106* 105* 236*    Signed:  Barton Dubois  [facility] 09/19/2014, 2:17 PM

## 2014-09-19 NOTE — Progress Notes (Signed)
Alert and oriented. Blood pressure reading low with dinamap. Re-checked with manual cuff, blood pressure reading was better.

## 2014-09-20 NOTE — Care Management Note (Addendum)
  Page 1 of 1   09/20/2014     2:50:23 PM CARE MANAGEMENT NOTE 09/20/2014  Patient:  Kathleen [name], Kathleen [name]   Account Number:  [id number]  Date Initiated:  09/20/2014  Documentation initiated by:  Floella Ensz  Subjective/Objective Assessment:   CHF     Action/Plan:   CM to follow for disposition needs   Anticipated DC Date:  09/19/2014   Anticipated DC Plan:  [place]         [facility] Choice  Resumption Of Svcs/PTA Provider   Choice offered to / List presented to:             Status of service:  Completed, signed off Medicare Important Message given?  NA - LOS <3 / Initial given by admissions (If response is "NO", the following Medicare IM given date fields will be blank) Date Medicare IM given:   Medicare IM given by:   Date Additional Medicare IM given:   Additional Medicare IM given by:    Discharge Disposition:  [place]  Per UR Regulation:  Reviewed for med. necessity/level of care/duration of stay  If discussed at [place] of Stay Meetings, dates discussed:    Comments:  Marylen Zuk RN, BSN, MSHL, CCM  Nurse - Case Manager,  (Unit [place])  [phone]  09/20/2014

## 2014-09-20 NOTE — Progress Notes (Signed)
UR completed / Retro  Brownie Gockel K. Maanya Hippert, RN, BSN, [place], CCM  09/20/2014 2:47 PM

## 2014-10-14 ENCOUNTER — Encounter (HOSPITAL_COMMUNITY): Payer: Self-pay | Admitting: Emergency Medicine

## 2015-01-23 ENCOUNTER — Inpatient Hospital Stay (HOSPITAL_COMMUNITY): Payer: Medicare Other

## 2015-01-23 ENCOUNTER — Encounter (HOSPITAL_COMMUNITY): Payer: Self-pay | Admitting: Emergency Medicine

## 2015-01-23 ENCOUNTER — Emergency Department (HOSPITAL_COMMUNITY): Payer: Medicare Other

## 2015-01-23 ENCOUNTER — Inpatient Hospital Stay (HOSPITAL_COMMUNITY)
Admission: EM | Admit: 2015-01-23 | Discharge: 2015-01-28 | DRG: 280 | Disposition: A | Discharge status: Home / Self Care | Payer: Medicare Other | Attending: Internal Medicine | Admitting: Internal Medicine

## 2015-01-23 DIAGNOSIS — I509 Heart failure, unspecified: Secondary | ICD-10-CM

## 2015-01-23 DIAGNOSIS — I214 Non-ST elevation (NSTEMI) myocardial infarction: Secondary | ICD-10-CM | POA: Diagnosis present

## 2015-01-23 DIAGNOSIS — N183 Chronic kidney disease, stage 3 (moderate): Secondary | ICD-10-CM | POA: Diagnosis present

## 2015-01-23 DIAGNOSIS — E11649 Type 2 diabetes mellitus with hypoglycemia without coma: Secondary | ICD-10-CM | POA: Diagnosis present

## 2015-01-23 DIAGNOSIS — I480 Paroxysmal atrial fibrillation: Secondary | ICD-10-CM | POA: Diagnosis not present

## 2015-01-23 DIAGNOSIS — N289 Disorder of kidney and ureter, unspecified: Secondary | ICD-10-CM

## 2015-01-23 DIAGNOSIS — R030 Elevated blood-pressure reading, without diagnosis of hypertension: Secondary | ICD-10-CM | POA: Diagnosis present

## 2015-01-23 DIAGNOSIS — Z6841 Body Mass Index (BMI) 40.0 and over, adult: Secondary | ICD-10-CM

## 2015-01-23 DIAGNOSIS — K219 Gastro-esophageal reflux disease without esophagitis: Secondary | ICD-10-CM | POA: Diagnosis present

## 2015-01-23 DIAGNOSIS — R079 Chest pain, unspecified: Secondary | ICD-10-CM

## 2015-01-23 DIAGNOSIS — N184 Chronic kidney disease, stage 4 (severe): Secondary | ICD-10-CM | POA: Diagnosis present

## 2015-01-23 DIAGNOSIS — I5033 Acute on chronic diastolic (congestive) heart failure: Principal | ICD-10-CM | POA: Diagnosis present

## 2015-01-23 DIAGNOSIS — Z87891 Personal history of nicotine dependence: Secondary | ICD-10-CM | POA: Diagnosis not present

## 2015-01-23 DIAGNOSIS — E785 Hyperlipidemia, unspecified: Secondary | ICD-10-CM | POA: Diagnosis present

## 2015-01-23 DIAGNOSIS — R0602 Shortness of breath: Secondary | ICD-10-CM | POA: Diagnosis not present

## 2015-01-23 DIAGNOSIS — I2510 Atherosclerotic heart disease of native coronary artery without angina pectoris: Secondary | ICD-10-CM | POA: Diagnosis present

## 2015-01-23 DIAGNOSIS — Z23 Encounter for immunization: Secondary | ICD-10-CM | POA: Diagnosis not present

## 2015-01-23 DIAGNOSIS — T502X5A Adverse effect of carbonic-anhydrase inhibitors, benzothiadiazides and other diuretics, initial encounter: Secondary | ICD-10-CM | POA: Diagnosis not present

## 2015-01-23 DIAGNOSIS — E119 Type 2 diabetes mellitus without complications: Secondary | ICD-10-CM

## 2015-01-23 DIAGNOSIS — R7989 Other specified abnormal findings of blood chemistry: Secondary | ICD-10-CM

## 2015-01-23 DIAGNOSIS — I5032 Chronic diastolic (congestive) heart failure: Secondary | ICD-10-CM | POA: Insufficient documentation

## 2015-01-23 DIAGNOSIS — I129 Hypertensive chronic kidney disease with stage 1 through stage 4 chronic kidney disease, or unspecified chronic kidney disease: Secondary | ICD-10-CM | POA: Diagnosis present

## 2015-01-23 DIAGNOSIS — R778 Other specified abnormalities of plasma proteins: Secondary | ICD-10-CM

## 2015-01-23 DIAGNOSIS — J441 Chronic obstructive pulmonary disease with (acute) exacerbation: Secondary | ICD-10-CM | POA: Diagnosis present

## 2015-01-23 DIAGNOSIS — J189 Pneumonia, unspecified organism: Secondary | ICD-10-CM | POA: Diagnosis present

## 2015-01-23 DIAGNOSIS — I10 Essential (primary) hypertension: Secondary | ICD-10-CM | POA: Diagnosis present

## 2015-01-23 LAB — CBC WITH DIFFERENTIAL/PLATELET
BASOS ABS: 0 10*3/uL (ref 0.0–0.1)
Basophils Relative: 0 % (ref 0–1)
Eosinophils Absolute: 0.2 10*3/uL (ref 0.0–0.7)
Eosinophils Relative: 2 % (ref 0–5)
HCT: 37.1 % (ref 36.0–46.0)
Hemoglobin: 12.1 g/dL (ref 12.0–15.0)
LYMPHS ABS: 2 10*3/uL (ref 0.7–4.0)
LYMPHS PCT: 17 % (ref 12–46)
MCH: 28.3 pg (ref 26.0–34.0)
MCHC: 32.6 g/dL (ref 30.0–36.0)
MCV: 86.9 fL (ref 78.0–100.0)
Monocytes Absolute: 0.6 10*3/uL (ref 0.1–1.0)
Monocytes Relative: 5 % (ref 3–12)
NEUTROS ABS: 9.1 10*3/uL — AB (ref 1.7–7.7)
NEUTROS PCT: 76 % (ref 43–77)
PLATELETS: 182 10*3/uL (ref 150–400)
RBC: 4.27 MIL/uL (ref 3.87–5.11)
RDW: 16 % — ABNORMAL HIGH (ref 11.5–15.5)
WBC: 12 10*3/uL — AB (ref 4.0–10.5)

## 2015-01-23 LAB — CBC
HCT: 35.8 % — ABNORMAL LOW (ref 36.0–46.0)
Hemoglobin: 11.5 g/dL — ABNORMAL LOW (ref 12.0–15.0)
MCH: 28.3 pg (ref 26.0–34.0)
MCHC: 32.1 g/dL (ref 30.0–36.0)
MCV: 88 fL (ref 78.0–100.0)
Platelets: 188 10*3/uL (ref 150–400)
RBC: 4.07 MIL/uL (ref 3.87–5.11)
RDW: 16 % — AB (ref 11.5–15.5)
WBC: 12.1 10*3/uL — AB (ref 4.0–10.5)

## 2015-01-23 LAB — MRSA PCR SCREENING: MRSA by PCR: NEGATIVE

## 2015-01-23 LAB — GLUCOSE, CAPILLARY
GLUCOSE-CAPILLARY: 77 mg/dL (ref 70–99)
GLUCOSE-CAPILLARY: 127 mg/dL — AB (ref 70–99)
Glucose-Capillary: 66 mg/dL — ABNORMAL LOW (ref 70–99)
Glucose-Capillary: 108 mg/dL — ABNORMAL HIGH (ref 70–99)
Glucose-Capillary: 117 mg/dL — ABNORMAL HIGH (ref 70–99)

## 2015-01-23 LAB — BASIC METABOLIC PANEL
Anion gap: 8 (ref 5–15)
Anion gap: 13 (ref 5–15)
BUN: 33 mg/dL — AB (ref 6–23)
BUN: 32 mg/dL — AB (ref 6–23)
CALCIUM: 9.5 mg/dL (ref 8.4–10.5)
CHLORIDE: 104 mmol/L (ref 96–112)
CO2: 28 mmol/L (ref 19–32)
CO2: 24 mmol/L (ref 19–32)
CREATININE: 1.85 mg/dL — AB (ref 0.50–1.10)
CREATININE: 1.71 mg/dL — AB (ref 0.50–1.10)
Calcium: 9.2 mg/dL (ref 8.4–10.5)
Chloride: 103 mmol/L (ref 96–112)
GFR calc Af Amer: 31 mL/min — ABNORMAL LOW (ref >90)
GFR calc non Af Amer: 26 mL/min — ABNORMAL LOW (ref >90)
GFR calc non Af Amer: 29 mL/min — ABNORMAL LOW (ref >90)
GFR, EST AFRICAN AMERICAN: 34 mL/min — AB (ref >90)
GLUCOSE: 122 mg/dL — AB (ref 70–99)
GLUCOSE: 102 mg/dL — AB (ref 70–99)
Potassium: 3.9 mmol/L (ref 3.5–5.1)
Potassium: 3.7 mmol/L (ref 3.5–5.1)
Sodium: 139 mmol/L (ref 135–145)
Sodium: 141 mmol/L (ref 135–145)

## 2015-01-23 LAB — HEPARIN LEVEL (UNFRACTIONATED): HEPARIN UNFRACTIONATED: 0.83 [IU]/mL — AB (ref 0.30–0.70)

## 2015-01-23 LAB — TROPONIN I
TROPONIN I: 0.04 ng/mL — AB (ref <0.031)
TROPONIN I: 0.06 ng/mL — AB (ref <0.031)
TROPONIN I: 0.07 ng/mL — AB (ref <0.031)
Troponin I: 0.09 ng/mL — ABNORMAL HIGH (ref <0.031)

## 2015-01-23 LAB — BRAIN NATRIURETIC PEPTIDE: B NATRIURETIC PEPTIDE 5: 676.7 pg/mL — AB (ref 0.0–100.0)

## 2015-01-23 LAB — TSH: TSH: 1.156 u[IU]/mL (ref 0.350–4.500)

## 2015-01-23 MED ORDER — FAMOTIDINE 20 MG PO TABS
20.0000 mg | ORAL_TABLET | Freq: Every day | ORAL | Status: DC
  Administered 2015-01-23 – 2015-01-27 (×5): 20 mg via ORAL
  Filled 2015-01-23 (×6): qty 1

## 2015-01-23 MED ORDER — AMLODIPINE BESYLATE 5 MG PO TABS
5.0000 mg | ORAL_TABLET | Freq: Every day | ORAL | Status: DC
  Administered 2015-01-23: 5 mg via ORAL
  Filled 2015-01-23 (×2): qty 1

## 2015-01-23 MED ORDER — ASPIRIN 81 MG PO CHEW
324.0000 mg | CHEWABLE_TABLET | Freq: Once | ORAL | Status: AC
Start: 2015-01-23 — End: 2015-01-23
  Administered 2015-01-23: 324 mg via ORAL
  Filled 2015-01-23: qty 4

## 2015-01-23 MED ORDER — FUROSEMIDE 10 MG/ML IJ SOLN
80.0000 mg | Freq: Once | INTRAMUSCULAR | Status: AC
  Administered 2015-01-23: 80 mg via INTRAVENOUS
  Filled 2015-01-23: qty 8

## 2015-01-23 MED ORDER — HEPARIN BOLUS VIA INFUSION
4000.0000 [IU] | Freq: Once | INTRAVENOUS | Status: AC
  Administered 2015-01-23: 4000 [IU] via INTRAVENOUS
  Filled 2015-01-23: qty 4000

## 2015-01-23 MED ORDER — CHLORTHALIDONE 25 MG PO TABS
25.0000 mg | ORAL_TABLET | Freq: Every day | ORAL | Status: DC
  Administered 2015-01-23 – 2015-01-24 (×2): 25 mg via ORAL
  Filled 2015-01-23 (×2): qty 1

## 2015-01-23 MED ORDER — ASPIRIN EC 325 MG PO TBEC
325.0000 mg | DELAYED_RELEASE_TABLET | Freq: Every day | ORAL | Status: DC
  Administered 2015-01-23 – 2015-01-28 (×6): 325 mg via ORAL
  Filled 2015-01-23 (×6): qty 1

## 2015-01-23 MED ORDER — ONDANSETRON HCL 4 MG PO TABS: 4.0000 mg | ORAL_TABLET | Freq: Four times a day (QID) | ORAL | Status: DC | PRN

## 2015-01-23 MED ORDER — SODIUM CHLORIDE 0.9 % IJ SOLN
3.0000 mL | Freq: Two times a day (BID) | INTRAMUSCULAR | Status: DC
  Administered 2015-01-23 – 2015-01-25 (×3): 3 mL via INTRAVENOUS

## 2015-01-23 MED ORDER — DOXEPIN HCL 10 MG/ML PO CONC
3.0000 mg | Freq: Every evening | ORAL | Status: DC
  Administered 2015-01-23 – 2015-01-27 (×5): 3 mg via ORAL
  Filled 2015-01-23 (×6): qty 0.3

## 2015-01-23 MED ORDER — INSULIN ASPART 100 UNIT/ML ~~LOC~~ SOLN
0.0000 [IU] | Freq: Three times a day (TID) | SUBCUTANEOUS | Status: DC
  Administered 2015-01-26: 2 [IU] via SUBCUTANEOUS

## 2015-01-23 MED ORDER — METOPROLOL TARTRATE 50 MG PO TABS
50.0000 mg | ORAL_TABLET | Freq: Two times a day (BID) | ORAL | Status: DC
  Filled 2015-01-23 (×2): qty 1

## 2015-01-23 MED ORDER — ALLOPURINOL 100 MG PO TABS
100.0000 mg | ORAL_TABLET | Freq: Every day | ORAL | Status: DC
  Administered 2015-01-23 – 2015-01-28 (×6): 100 mg via ORAL
  Filled 2015-01-23 (×6): qty 1

## 2015-01-23 MED ORDER — IRBESARTAN 300 MG PO TABS
300.0000 mg | ORAL_TABLET | Freq: Every day | ORAL | Status: DC
  Administered 2015-01-23 – 2015-01-24 (×2): 300 mg via ORAL
  Filled 2015-01-23 (×2): qty 1

## 2015-01-23 MED ORDER — PRAVASTATIN SODIUM 80 MG PO TABS
80.0000 mg | ORAL_TABLET | Freq: Every day | ORAL | Status: DC
  Administered 2015-01-23 – 2015-01-28 (×6): 80 mg via ORAL
  Filled 2015-01-23 (×6): qty 1

## 2015-01-23 MED ORDER — ONDANSETRON HCL 4 MG/2ML IJ SOLN: 4.0000 mg | Freq: Four times a day (QID) | INTRAMUSCULAR | Status: DC | PRN

## 2015-01-23 MED ORDER — COLCHICINE 0.6 MG PO TABS
0.6000 mg | ORAL_TABLET | Freq: Every day | ORAL | Status: DC | PRN
  Filled 2015-01-23: qty 1

## 2015-01-23 MED ORDER — HYDRALAZINE HCL 25 MG PO TABS
25.0000 mg | ORAL_TABLET | Freq: Three times a day (TID) | ORAL | Status: DC
  Administered 2015-01-23 – 2015-01-24 (×3): 25 mg via ORAL
  Filled 2015-01-23 (×7): qty 1

## 2015-01-23 MED ORDER — ASPIRIN 81 MG PO CHEW
CHEWABLE_TABLET | ORAL | Status: AC
  Filled 2015-01-23: qty 1

## 2015-01-23 MED ORDER — MORPHINE SULFATE 2 MG/ML IJ SOLN: 1.0000 mg | INTRAMUSCULAR | Status: DC | PRN

## 2015-01-23 MED ORDER — TERBINAFINE HCL 250 MG PO TABS
250.0000 mg | ORAL_TABLET | Freq: Every day | ORAL | Status: DC
  Administered 2015-01-23 – 2015-01-28 (×6): 250 mg via ORAL
  Filled 2015-01-23 (×6): qty 1

## 2015-01-23 MED ORDER — REGADENOSON 0.4 MG/5ML IV SOLN
0.4000 mg | Freq: Once | INTRAVENOUS | Status: AC
  Administered 2015-01-23: 0.4 mg via INTRAVENOUS
  Filled 2015-01-23: qty 5

## 2015-01-23 MED ORDER — REGADENOSON 0.4 MG/5ML IV SOLN
INTRAVENOUS | Status: AC
  Administered 2015-01-23: 0.4 mg via INTRAVENOUS
  Filled 2015-01-23: qty 5

## 2015-01-23 MED ORDER — GLIPIZIDE 5 MG PO TABS
5.0000 mg | ORAL_TABLET | Freq: Two times a day (BID) | ORAL | Status: DC
  Administered 2015-01-24 – 2015-01-26 (×3): 5 mg via ORAL
  Filled 2015-01-23 (×9): qty 1

## 2015-01-23 MED ORDER — DOXEPIN HCL 3 MG PO TABS: 1.0000 | ORAL_TABLET | Freq: Every evening | ORAL | Status: DC

## 2015-01-23 MED ORDER — PNEUMOCOCCAL VAC POLYVALENT 25 MCG/0.5ML IJ INJ
0.5000 mL | INJECTION | INTRAMUSCULAR | Status: AC
  Administered 2015-01-25: 0.5 mL via INTRAMUSCULAR
  Filled 2015-01-23: qty 0.5

## 2015-01-23 MED ORDER — BUDESONIDE 0.25 MG/2ML IN SUSP
0.2500 mg | Freq: Two times a day (BID) | RESPIRATORY_TRACT | Status: DC
  Administered 2015-01-23 – 2015-01-28 (×10): 0.25 mg via RESPIRATORY_TRACT
  Filled 2015-01-23 (×13): qty 2

## 2015-01-23 MED ORDER — DEXTROSE 50 % IV SOLN
INTRAVENOUS | Status: AC
  Administered 2015-01-23: 25 g
  Filled 2015-01-23: qty 50

## 2015-01-23 MED ORDER — ASPIRIN 81 MG PO CHEW: 243.0000 mg | CHEWABLE_TABLET | Freq: Once | ORAL | Status: DC

## 2015-01-23 MED ORDER — ACETAMINOPHEN 325 MG PO TABS: 650.0000 mg | ORAL_TABLET | Freq: Four times a day (QID) | ORAL | Status: DC | PRN

## 2015-01-23 MED ORDER — AZITHROMYCIN 500 MG PO TABS
500.0000 mg | ORAL_TABLET | Freq: Every day | ORAL | Status: DC
  Administered 2015-01-23 – 2015-01-27 (×5): 500 mg via ORAL
  Filled 2015-01-23 (×5): qty 1

## 2015-01-23 MED ORDER — CEFTRIAXONE SODIUM IN DEXTROSE 20 MG/ML IV SOLN
1.0000 g | INTRAVENOUS | Status: DC
  Administered 2015-01-23 – 2015-01-25 (×3): 1 g via INTRAVENOUS
  Filled 2015-01-23 (×4): qty 50

## 2015-01-23 MED ORDER — HEPARIN (PORCINE) IN NACL 100-0.45 UNIT/ML-% IJ SOLN
1250.0000 [IU]/h | INTRAMUSCULAR | Status: DC
  Administered 2015-01-23: 1350 [IU]/h via INTRAVENOUS
  Administered 2015-01-23: 1150 [IU]/h via INTRAVENOUS
  Filled 2015-01-23 (×5): qty 250

## 2015-01-23 MED ORDER — SODIUM CHLORIDE 0.9 % IJ SOLN
3.0000 mL | Freq: Two times a day (BID) | INTRAMUSCULAR | Status: DC
  Administered 2015-01-23 – 2015-01-26 (×6): 3 mL via INTRAVENOUS

## 2015-01-23 MED ORDER — FUROSEMIDE 10 MG/ML IJ SOLN
60.0000 mg | Freq: Two times a day (BID) | INTRAMUSCULAR | Status: DC
  Administered 2015-01-23 (×2): 60 mg via INTRAVENOUS
  Filled 2015-01-23 (×4): qty 6

## 2015-01-23 MED ORDER — METOPROLOL TARTRATE 50 MG PO TABS
62.5000 mg | ORAL_TABLET | Freq: Two times a day (BID) | ORAL | Status: DC
  Administered 2015-01-23 (×2): 62.5 mg via ORAL
  Filled 2015-01-23 (×4): qty 1

## 2015-01-23 MED ORDER — ACETAMINOPHEN 650 MG RE SUPP
650.0000 mg | Freq: Four times a day (QID) | RECTAL | Status: DC | PRN
Start: 2015-01-23 — End: 2015-01-28

## 2015-01-23 MED ORDER — NITROGLYCERIN IN D5W 200-5 MCG/ML-% IV SOLN
0.0000 ug/min | INTRAVENOUS | Status: DC
  Administered 2015-01-23: 5 ug/min via INTRAVENOUS
  Filled 2015-01-23: qty 250

## 2015-01-23 NOTE — ED Notes (Signed)
Pt placed on O2 @ 2L at this time.

## 2015-01-23 NOTE — Progress Notes (Signed)
Utilization review completed.  

## 2015-01-23 NOTE — Progress Notes (Signed)
lexiscan myoview completed without complications.  Nuc results to follow, she did have brief episode of chest pain and SOB that resolved quickly.

## 2015-01-23 NOTE — ED Notes (Addendum)
Pt family is requesting that the pt be put on O2. Nurse notified.

## 2015-01-23 NOTE — ED Notes (Signed)
Pt. Reports progressing SOB with productive cough and edema at lower legs onset this week , denies fever or chills. No chest pain .

## 2015-01-23 NOTE — ED Notes (Signed)
IV attempted x 2 without success. Second RN to attempt

## 2015-01-23 NOTE — Consult Note (Addendum)
Cardiology Consultation  Monita D Wagler    [id number] 05/28/1946  Reason for Consult: Chest pain/SOB  Requesting Physician: Arshad N Kakrakandy, MD  Primary Cardiologist: Dr. Hochrein  HPI:  Ms. Carneshia Routt is a 68 year year-old African-American female who is known Carney artery disease and is status post stenting of her RCA in 2003.  She has a history of hypertension, type 2 diabetes mellitus, chronic kidney disease and shortness of breath.  She has seen Dr. Hochrein in the office and last saw him in 2013.  Recently, she had admitted to some cold-like symptoms and had been coughing.  For the past week, she has noted some vague chest tightness sensation.  This has been nonexertional.  At times it may last for hours and self abate.  Yesterday, she noticed more episodes of chest pain and mild shortness of breath.  She was admitted to the hospital this morning.  Troponin was borderline positive at 0.04.  She was not found to have acute ST segment changes.  She initially was treated with heparin and is currently on IV nitroglycerin at 7 g.  She is completely pain-free.  Upon further questioning, she states her recent symptomatology is distinctly different from the discomfort that she had experienced in 2003.  Prior to her RCA stent insertion.  Cardiology consultation is requested.   Past Medical History  Diagnosis Date  . Hypertension   . Coronary artery disease     Cypher stent to the RCA in 2003  . Blood transfusion     no side affects  . GERD (gastroesophageal reflux disease)   . Arthritis   . Diabetes mellitus     Borderline  . CHF (congestive heart failure)   . Shortness of breath    Past Surgical History  Procedure Laterality Date  . Total knee arthroplasty    . Cholecystectomy    . Coronary angioplasty with stent placement      FAMHx: Family History  Problem Relation Age of Onset  . Hypertension Father   . Heart disease Father   . Gout Father   . Arthritis Father       SOCHx:  reports that she quit smoking about a year ago. Her smoking use included Cigarettes. She has a 9 pack-year smoking history. She has never used smokeless tobacco. She reports that she does not drink alcohol or use illicit drugs.  ALLERGIES: Allergies  Allergen Reactions  . Penicillins Other (See Comments)    unknown    ROS General: Negative; No fevers, chills, or night sweats; positive for obesity HEENT: Negative; No changes in vision or hearing, sinus congestion, difficulty swallowing Pulmonary: Negative; No cough, wheezing, shortness of breath, hemoptysis Cardiovascular: See history of present illness GI: Positive for GERD; No nausea, vomiting, diarrhea, or abdominal pain GU: Negative; No dysuria, hematuria, or difficulty voiding Musculoskeletal: Positive for arthritis Hematologic/Oncology: Negative; no easy bruising, bleeding Endocrine: Positive for diabetes mellitus Neuro: Negative; no changes in balance, headaches Skin: Negative; No rashes or skin lesions Psychiatric: Negative; No behavioral problems, depression Sleep: Negative; No snoring, daytime sleepiness, hypersomnolence, bruxism, restless legs, hypnogognic hallucinations, no cataplexy Other comprehensive 14 point system review is negative.    HOME MEDICATIONS: Prescriptions prior to admission  Medication Sig Dispense Refill Last Dose  . albuterol (PROVENTIL HFA;VENTOLIN HFA) 108 (90 BASE) MCG/ACT inhaler Inhale 1-2 puffs into the lungs every 6 (six) hours as needed for wheezing or shortness of breath. 1 Inhaler 0 01/22/2015 at Unknown time  . allopurinol (ZYLOPRIM) 100   MG tablet Take 100 mg by mouth daily.   01/22/2015 at Unknown time  . aspirin EC 81 MG tablet Take 81 mg by mouth daily.     01/22/2015 at Unknown time  . chlorthalidone (HYGROTON) 25 MG tablet Take 25 mg by mouth daily.   01/22/2015 at Unknown time  . colchicine 0.6 MG tablet Take 0.6 mg by mouth daily as needed (for gout flare ups).    01/22/2015  at Unknown time  . Doxepin HCl 3 MG TABS Take 1 tablet by mouth every evening.   01/22/2015 at Unknown time  . famotidine (PEPCID) 20 MG tablet Take 20 mg by mouth at bedtime.    01/22/2015 at Unknown time  . furosemide (LASIX) 40 MG tablet Take 1.5 tablets (60 mg total) by mouth daily. 90 tablet 1 01/22/2015 at Unknown time  . glipiZIDE (GLUCOTROL) 5 MG tablet Take 1 tablet (5 mg total) by mouth 2 (two) times daily before a meal. 60 tablet 1 01/22/2015 at Unknown time  . isosorbide-hydrALAZINE (BIDIL) 20-37.5 MG per tablet Take 1 tablet by mouth 3 (three) times daily. 90 tablet 1 01/22/2015 at Unknown time  . metoprolol (LOPRESSOR) 50 MG tablet Take 1 tablet by mouth 2 (two) times daily.  0 01/22/2015 at 2200  . pravastatin (PRAVACHOL) 80 MG tablet Take 80 mg by mouth daily.   01/22/2015 at Unknown time  . telmisartan (MICARDIS) 80 MG tablet Take 80 mg by mouth daily.   01/22/2015 at Unknown time  . terbinafine (LAMISIL) 250 MG tablet Take 250 mg by mouth daily.  0 01/22/2015 at Unknown time  . metoprolol (LOPRESSOR) 100 MG tablet Take 1 tablet (100 mg total) by mouth 2 (two) times daily. (Patient not taking: Reported on 01/23/2015) 60 tablet 1 Not Taking at Unknown time  . pravastatin (PRAVACHOL) 80 MG tablet Take 1 tablet (80 mg total) by mouth daily. 30 tablet 6 09/16/2014 at Unknown time  . telmisartan (MICARDIS) 40 MG tablet Take 1 tablet (40 mg total) by mouth daily. (Patient not taking: Reported on 01/23/2015) 30 tablet 1 Not Taking at Unknown time    HOSPITAL MEDICATIONS:  . allopurinol  100 mg Oral Daily  . aspirin EC  325 mg Oral Daily  . budesonide (PULMICORT) nebulizer solution  0.25 mg Nebulization BID  . chlorthalidone  25 mg Oral Daily  . doxepin  3 mg Oral QPM  . famotidine  20 mg Oral QHS  . furosemide  60 mg Intravenous Q12H  . glipiZIDE  5 mg Oral BID AC  . hydrALAZINE  25 mg Oral 3 times per day  . insulin aspart  0-9 Units Subcutaneous TID WC  . irbesartan  300 mg Oral Daily  .  metoprolol  50 mg Oral BID  . [START ON 01/24/2015] pneumococcal 23 valent vaccine  0.5 mL Intramuscular Tomorrow-1000  . pravastatin  80 mg Oral Daily  . sodium chloride  3 mL Intravenous Q12H  . sodium chloride  3 mL Intravenous Q12H  . terbinafine  250 mg Oral Daily   VITALS: Blood pressure 162/81, pulse 78, temperature 97.9 F (36.6 C), temperature source Oral, resp. rate 23, height 5' 4" (1.626 m), weight 276 lb 10.8 oz (125.5 kg), SpO2 99 %.  PHYSICAL EXAM: General appearance: alert, cooperative and no distress Neck: no adenopathy, no carotid bruit, no JVD, supple, symmetrical, trachea midline and thyroid not enlarged, symmetric, no tenderness/mass/nodules Lungs: clear to auscultation bilaterally Chest wall: Positive for tenderness over the costochondral regions bilaterally to  palpation Heart: regular rate and rhythm and 1/6 sem Abdomen: soft, non-tender; bowel sounds normal; no masses,  no organomegaly and central adiposity Extremities: no ulcers, gangrene or trophic changes and trace LE pedal edema Pulses: 2+ and symmetric Back: No CVA tenderness Skin: Skin color, texture, turgor normal. No rashes or lesions Neurologic: Grossly normal Logical: Normal affect and mood   ECG: (Independently read by me): Normal sinus rhythm at 81 bpm.  PACs.  QS complex V1 through V3.  No acute ST segment changes.  Normal intervals.  LABS: Results for orders placed or performed during the hospital encounter of 01/23/15 (from the past 48 hour(s))  BNP (order ONLY if patient complains of dyspnea/SOB AND you have documented it for THIS visit)     Status: Abnormal   Collection Time: 01/23/15  1:01 AM  Result Value Ref Range   B Natriuretic Peptide 676.7 (H) 0.0 - 100.0 pg/mL  CBC with Differential     Status: Abnormal   Collection Time: 01/23/15  1:01 AM  Result Value Ref Range   WBC 12.0 (H) 4.0 - 10.5 K/uL   RBC 4.27 3.87 - 5.11 MIL/uL   Hemoglobin 12.1 12.0 - 15.0 g/dL   HCT 37.1 36.0 - 46.0  %   MCV 86.9 78.0 - 100.0 fL   MCH 28.3 26.0 - 34.0 pg   MCHC 32.6 30.0 - 36.0 g/dL   RDW 16.0 (H) 11.5 - 15.5 %   Platelets 182 150 - 400 K/uL   Neutrophils Relative % 76 43 - 77 %   Neutro Abs 9.1 (H) 1.7 - 7.7 K/uL   Lymphocytes Relative 17 12 - 46 %   Lymphs Abs 2.0 0.7 - 4.0 K/uL   Monocytes Relative 5 3 - 12 %   Monocytes Absolute 0.6 0.1 - 1.0 K/uL   Eosinophils Relative 2 0 - 5 %   Eosinophils Absolute 0.2 0.0 - 0.7 K/uL   Basophils Relative 0 0 - 1 %   Basophils Absolute 0.0 0.0 - 0.1 K/uL  Troponin I     Status: Abnormal   Collection Time: 01/23/15  1:01 AM  Result Value Ref Range   Troponin I 0.04 (H) <0.031 ng/mL    Comment:        PERSISTENTLY INCREASED TROPONIN VALUES IN THE RANGE OF 0.04-0.49 ng/mL CAN BE SEEN IN:       -UNSTABLE ANGINA       -CONGESTIVE HEART FAILURE       -MYOCARDITIS       -CHEST TRAUMA       -ARRYHTHMIAS       -LATE PRESENTING MYOCARDIAL INFARCTION       -COPD   CLINICAL FOLLOW-UP RECOMMENDED.   Basic metabolic panel     Status: Abnormal   Collection Time: 01/23/15  1:03 AM  Result Value Ref Range   Sodium 139 135 - 145 mmol/L   Potassium 3.9 3.5 - 5.1 mmol/L   Chloride 103 96 - 112 mmol/L   CO2 28 19 - 32 mmol/L   Glucose, Bld 122 (H) 70 - 99 mg/dL   BUN 33 (H) 6 - 23 mg/dL   Creatinine, Ser 1.85 (H) 0.50 - 1.10 mg/dL   Calcium 9.5 8.4 - 10.5 mg/dL   GFR calc non Af Amer 26 (L) >90 mL/min   GFR calc Af Amer 31 (L) >90 mL/min    Comment: (NOTE) The eGFR has been calculated using the CKD EPI equation. This calculation has not been validated in all  clinical situations. eGFR's persistently <90 mL/min signify possible Chronic Kidney Disease.    Anion gap 8 5 - 15  CBC     Status: Abnormal   Collection Time: 01/23/15  7:03 AM  Result Value Ref Range   WBC 12.1 (H) 4.0 - 10.5 K/uL   RBC 4.07 3.87 - 5.11 MIL/uL   Hemoglobin 11.5 (L) 12.0 - 15.0 g/dL   HCT 35.8 (L) 36.0 - 46.0 %   MCV 88.0 78.0 - 100.0 fL   MCH 28.3 26.0 - 34.0  pg   MCHC 32.1 30.0 - 36.0 g/dL   RDW 16.0 (H) 11.5 - 15.5 %   Platelets 188 150 - 400 K/uL   Lipid Panel     Component Value Date/Time   CHOL 144 12/10/2011 0902   TRIG 56.0 12/10/2011 0902   HDL 51.90 12/10/2011 0902   CHOLHDL 3 12/10/2011 0902   VLDL 11.2 12/10/2011 0902   LDLCALC 81 12/10/2011 0902    IMAGING: Dg Chest 2 View  01/23/2015   CLINICAL DATA:  Shortness of breath, leg swelling. History of hypertension, diabetes and CHF.  EXAM: CHEST  2 VIEW  COMPARISON:  Chest radiograph September 17, 2014  FINDINGS: The cardiac silhouette is mild to moderately enlarged, unchanged. Calcified aortic knob. Mild interstitial prominence with pulmonary vascular congestion, no pleural effusion or focal consolidation. No pneumothorax.  Mild osteopenia. Moderate degenerative change of thoracic spine. Surgical clips in the included right abdomen likely reflect cholecystectomy.  IMPRESSION: Stable cardiomegaly. Pulmonary vascular congestion and interstitial prominence most consistent with pulmonary edema.   Electronically Signed   By: Courtnay  Bloomer   On: 01/23/2015 01:46    IMPRESSION/RECOMMENDATION:  1. Chest pain: Patient has a history of remote RCA stenting in 2003.  Her chest pain.  At that time her chest pain was distinctly different from present current chest sensation.  She has some atypical features in that it has occurred at rest.  She recently had admitted to coughing a fair amount over the past several weeks.  She has chest wall tenderness to palpation on physical exam.  Initial risk stratification will be obtained with a Lexiscan Myoview study to make certain there is no potential ischemia.  Will wean and DC IV nitroglycerin.  2.  Mild troponin Elevation.  No acute ECG changes on ECG.  Doubt acute coronary syndrome.  Will cycle cardiac markers.  A 2-D echo Doppler study will be obtained today.   3.  Shortness of breath.  Suspect a component of diastolic dysfunction.  She is hypertensive  and has been on multiple hypertensive medications including ARB, and beta blocker therapy.  She does have occasional to frequent PACs.  Her beta blocker will be slightly titrated today from 50 mg twice a day to 62.5 mg twice a day.  In light of her significant blood pressure elevation, amlodipine 5 mg will be added to her medical regimen.  A 2-D echo Doppler study will be obtained to assess both systolic and diastolic function as well as valvular architecture.  4.  Chronic kidney disease, stage III.  Creatinine initially was 1.85.  Repeat is 1.71.  Estimated GFR is 34 in this African-American female.  Cardiac catheterization will be deferred and less.  Her nuclear study suggests intermediate or high-risk findings worrisome for ischemia.  If renal function worsens, may need to temporarily hold ARB therapy.  5.  Type 2 diabetes mellitus with renal insufficiency.  6.  Hyperlipidemia, currently on statin therapy:  Pravastatin 80 mg.    Target LDL is less than 70 in this diabetic female an patient with CAD.  If patient is not at goal will consider changing to a different agent  such as atorvastatin or rosuvastatin or adding Zetia    Troy Sine, MD, [facility] 01/23/2015 8:38 AM

## 2015-01-23 NOTE — Progress Notes (Signed)
ANTICOAGULATION CONSULT NOTE - Follow Up Consult  Pharmacy Consult for Heparin Indication: chest pain/ACS  Allergies  Allergen Reactions  . Penicillins Other (See Comments)    unknown    Patient Measurements: Height: 5\' 4"  (162.6 cm) Weight: 276 lb 10.8 oz (125.5 kg) IBW/kg (Calculated) : 54.7 Heparin Dosing Weight: 85 kg  Vital Signs: Temp: 98.1 F (36.7 C) (02/11 1115) Temp Source: Oral (02/11 1115) BP: 133/50 mmHg (02/11 1514) Pulse Rate: 74 (02/11 1514)  Labs:  Recent Labs  01/23/15 0101 01/23/15 0103 01/23/15 0703 01/23/15 1212 01/23/15 1250  HGB 12.1  --  11.5*  --   --   HCT 37.1  --  35.8*  --   --   PLT 182  --  188  --   --   HEPARINUNFRC  --   --   --  0.83*  --   CREATININE  --  1.85* 1.71*  --   --   TROPONINI 0.04*  --  0.06*  --  0.07*    Estimated Creatinine Clearance: 40.1 mL/min (by C-G formula based on Cr of 1.71).  Assessment:   Initial heparin level is above target range (0.83) on 1350 units/hr.    Patient is off floor at stress test.  Goal of Therapy:  Heparin level 0.3-0.7 units/ml Monitor platelets by anticoagulation protocol: Yes   Plan:   Heparin drip to be decreased to 1150 units/hr when patient returns to the floor. Discussed with RN.  Will check heparin level ~ 8hrs after rate change.  Daily heparin level and CBC while on heparin.  Bmet changed to Cmet for am, to check LFTs. On Pravastatin 80 mg daily.  Arty Baumgartner, [place] Pager: [phone] 01/23/2015,3:18 PM

## 2015-01-23 NOTE — Progress Notes (Signed)
Received call back from Dr. Sherral Hammers. Will keep pt NPO for now until after cardiology sees pt. Will continue to monitor pt.

## 2015-01-23 NOTE — ED Notes (Signed)
Pt to ED c/o increased shortness of breath and bilateral leg swelling over the past week. Pt presents with bilateral edema to lower limbs. Reports intermittent swelling. Pt also has been using inhaler at home without relief. Wheezes noted bilaterally

## 2015-01-23 NOTE — Progress Notes (Signed)
ANTICOAGULATION CONSULT NOTE - Initial Consult  Pharmacy Consult for Heparin Indication: chest pain/ACS  Allergies  Allergen Reactions  . Penicillins Other (See Comments)    unknown    Patient Measurements: Height: 5\' 4"  (162.6 cm) Weight: 277 lb (125.646 kg) IBW/kg (Calculated) : 54.7 Heparin Dosing Weight: 85 kg  Vital Signs: Temp: 98.7 F (37.1 C) (02/11 0035) Temp Source: Oral (02/11 0035) BP: 181/75 mmHg (02/11 0247) Pulse Rate: 84 (02/11 0247)  Labs:  Recent Labs  01/23/15 0101 01/23/15 0103  HGB 12.1  --   HCT 37.1  --   PLT 182  --   CREATININE  --  1.85*  TROPONINI 0.04*  --     Estimated Creatinine Clearance: 37.1 mL/min (by C-G formula based on Cr of 1.85).   Medical History: Past Medical History  Diagnosis Date  . Hypertension   . Coronary artery disease     Cypher stent to the RCA in 2003  . Blood transfusion     no side affects  . GERD (gastroesophageal reflux disease)   . Arthritis   . Diabetes mellitus     Borderline  . CHF (congestive heart failure)   . Shortness of breath     Medications:  ALbuterol  Lasix  GLucotrol  Bidil  Pravachol  Allopurinol  ASA  Chlorthalidone  Colchicine  Deoxpin  Pepcid  Lopressor  Pravachol  Micardis  Lamisil  Assessment: 69 y.o. female with SOB/leg swelling, possible ACS for heparin   Goal of Therapy:  Heparin level 0.3-0.7 units/ml Monitor platelets by anticoagulation protocol: Yes   Plan:  Heparin 4000 units IV bolus, then start heparin 1350 units/hr Check heparin level in 8 hours.   Caryl Pina 01/23/2015,3:07 AM

## 2015-01-23 NOTE — Progress Notes (Signed)
[place] TEAM 1 - Stepdown/ICU TEAM Progress Note  Kathleen [name] [id number] DOB: Nov 12, 1946 DOA: 01/23/2015 PCP: Philis Fendt, MD  Admit HPI / Brief Narrative: Kathleen [name] is a 69 y.o. BF PMHx CAD status post stenting, CHF last EF measured in October 2015 was 60-65%, hypertension, diabetes mellitus type 2, chronic kidney disease Presents to the ER because of worsening shortness of breath and chest pain. Patient states her symptoms started 3-4 days ago with shortness of breath wheezing and productive cough. Denies any fever or chills. Over the last 2 days patient also has been having progressively worsening chest pain which is retrosternal nonradiating pressure-like. In the ER patient was found to have lower extremity edema and on exam has bilateral expiratory wheeze. Chest x-ray shows congestion and interstitial edema. Troponin is mildly positive. Patient has been admitted for further management of her shortness of breath which at this time probably a combination of CHF and COPD with non-ST elevation MI. Patient denies any nausea vomiting abdominal pain diarrhea.   HPI/Subjective: 2/11 A/O 4, states chest pain has resolved, positive continued SOB. States negative home O2 requirement  Assessment/Plan: Shortness of breath/ COPD exacerbation/CAP - mostly combination of CHF and COPD - patient last here measured in October 2015 was 60-65%. At this time I have continued patient on Lasix 60 mg IV every 12 hourly patient did receive 1 dose in the ER. In addition patient has been placed on nebulizer and Pulmicort for COPD. Closely follow intake output daily weights and metabolic panel.  -Azithromycin  Non-ST elevation MI  - S/P first part of Lexiscan completed today, second part to be completed in the a.m.   Chronic diastolic CHF -Amlodipine 5 mg daily -Aspirin 325 mg daily -Chlorthalidone 25 mg daily -Lasix 60 mg BID -Hydralazine 25 mg TID -Irbesartan 300 mg Daily  Diabetes  mellitus type 2 controlled  - continue home medication and since patient is nothing by mouth closely follow CBGs.  Hypertension  - since patient is on nitroglycerin infusion at this time we will hold off patient's Biidil but I have placed patient on hydralazine orally which can be changed back to BIDIL once patient's nitroglycerin infusion is discontinued.  Chronic kidney disease stage III  - creatinine appears to be at baseline.  Hyperlipidemia  -on statins.  Code Status: FULL Family Communication: no family present at time of exam Disposition Plan: Per cardiology  Consultants: Skip Mayer (cardiology)  Procedure/Significant Events: 2/11 echocardiogram;- LVEF= 55% to 60%. - (grade 2 diastolic dysfunction).   Culture   Antibiotics: Azithromycin 2/11>> Ceftriaxone 2/11>>  DVT prophylaxis: Heparin drip   Devices    LINES / TUBES:      Continuous Infusions: . heparin 1,150 Units/hr (01/23/15 1800)    Objective: VITAL SIGNS: Temp: 98.1 F (36.7 C) (02/11 1916) Temp Source: Oral (02/11 1916) BP: 163/61 mmHg (02/11 1815) Pulse Rate: 73 (02/11 1815) SPO2; FIO2:   Intake/Output Summary (Last 24 hours) at 01/23/15 2026 Last data filed at 01/23/15 1800  Gross per 24 hour  Intake    160 ml  Output    825 ml  Net   -665 ml     Exam: General: A/O 4, NAD, No acute respiratory distress Lungs: Clear to auscultation bilaterally without wheezes or crackles Cardiovascular: Regular rate and rhythm without murmur gallop or rub normal S1 and S2 Abdomen: Nontender, nondistended, soft, bowel sounds positive, no rebound, no ascites, no appreciable mass Extremities: No significant cyanosis, clubbing, or edema bilateral lower extremities  Data Reviewed: Basic Metabolic Panel:  Recent Labs Lab 01/23/15 0103 01/23/15 0703  NA 139 141  K 3.9 3.7  CL 103 104  CO2 28 24  GLUCOSE 122* 102*  BUN 33* 32*  CREATININE 1.85* 1.71*  CALCIUM 9.5 9.2   Liver  Function Tests: No results for input(s): AST, ALT, ALKPHOS, BILITOT, PROT, ALBUMIN in the last 168 hours. No results for input(s): LIPASE, AMYLASE in the last 168 hours. No results for input(s): AMMONIA in the last 168 hours. CBC:  Recent Labs Lab 01/23/15 0101 01/23/15 0703  WBC 12.0* 12.1*  NEUTROABS 9.1*  --   HGB 12.1 11.5*  HCT 37.1 35.8*  MCV 86.9 88.0  PLT 182 188   Cardiac Enzymes:  Recent Labs Lab 01/23/15 0101 01/23/15 0703 01/23/15 1250 01/23/15 1845  TROPONINI 0.04* 0.06* 0.07* 0.09*   BNP (last 3 results)  Recent Labs  01/23/15 0101  BNP 676.7*    ProBNP (last 3 results)  Recent Labs  04/07/14 1609 09/17/14 0544  PROBNP 1679.0* 4469.0*    CBG:  Recent Labs Lab 01/23/15 0836 01/23/15 1213 01/23/15 1246 01/23/15 1807  GLUCAP 77 66* 127* 108*    Recent Results (from the past 240 hour(s))  MRSA PCR Screening     Status: None   Collection Time: 01/23/15  7:44 AM  Result Value Ref Range Status   MRSA by PCR NEGATIVE NEGATIVE Final    Comment:        The GeneXpert MRSA Assay (FDA approved for NASAL specimens only), is one component of a comprehensive MRSA colonization surveillance program. It is not intended to diagnose MRSA infection nor to guide or monitor treatment for MRSA infections.      Studies:  Recent x-ray studies have been reviewed in detail by the Attending Physician  Scheduled Meds:  Scheduled Meds: . allopurinol  100 mg Oral Daily  . amLODipine  5 mg Oral Daily  . aspirin EC  325 mg Oral Daily  . budesonide (PULMICORT) nebulizer solution  0.25 mg Nebulization BID  . chlorthalidone  25 mg Oral Daily  . doxepin  3 mg Oral QPM  . famotidine  20 mg Oral QHS  . furosemide  60 mg Intravenous Q12H  . glipiZIDE  5 mg Oral BID AC  . hydrALAZINE  25 mg Oral 3 times per day  . insulin aspart  0-9 Units Subcutaneous TID WC  . irbesartan  300 mg Oral Daily  . metoprolol  62.5 mg Oral BID  . [START ON 01/24/2015]  pneumococcal 23 valent vaccine  0.5 mL Intramuscular Tomorrow-1000  . pravastatin  80 mg Oral Daily  . sodium chloride  3 mL Intravenous Q12H  . sodium chloride  3 mL Intravenous Q12H  . terbinafine  250 mg Oral Daily    Time spent on care of this patient: 40 mins   Allie Bossier [facility]  [facility] Office  [phone] Pager - [phone]  On-Call/Text Page:      [email or web address]      password [email or web address]  If 7PM-7AM, please contact night-coverage [email or web address] Password [email or web address] 01/23/2015, 8:26 PM   LOS: 0 days   Care during the described time interval was provided by me .  I have reviewed this patient's available data, including medical history, events of note, physical examination, radiology studies and test results as part of my evaluation  Dia Crawford, MD [phone] Pager

## 2015-01-23 NOTE — Progress Notes (Signed)
Attempted to call and receive report.  

## 2015-01-23 NOTE — Progress Notes (Signed)
2D Echocardiogram Complete.  01/23/2015   Ivon Roedel, RDCS  

## 2015-01-23 NOTE — Progress Notes (Signed)
ANTIBIOTIC CONSULT NOTE - INITIAL  Pharmacy Consult for Ceftriaxone Indication: CAP  Allergies  Allergen Reactions  . Penicillins Other (See Comments)    unknown    Patient Measurements: Height: 5\' 4"  (162.6 cm) Weight: 276 lb 10.8 oz (125.5 kg) IBW/kg (Calculated) : 54.7 Adjusted Body Weight: n/a Vital Signs: Temp: 98.1 F (36.7 C) (02/11 1916) Temp Source: Oral (02/11 1916) BP: 146/74 mmHg (02/11 2000) Pulse Rate: 43 (02/11 2000) Intake/Output from previous day: 02/10 0701 - 02/11 0700 In: 27 [I.V.:27] Out: 825 [Urine:825] Intake/Output from this shift: Total I/O In: 11.5 [I.V.:11.5] Out: -   Labs:  Recent Labs  01/23/15 0101 01/23/15 0103 01/23/15 0703  WBC 12.0*  --  12.1*  HGB 12.1  --  11.5*  PLT 182  --  188  CREATININE  --  1.85* 1.71*   Estimated Creatinine Clearance: 40.1 mL/min (by C-G formula based on Cr of 1.71). No results for input(s): VANCOTROUGH, VANCOPEAK, VANCORANDOM, GENTTROUGH, GENTPEAK, GENTRANDOM, TOBRATROUGH, TOBRAPEAK, TOBRARND, AMIKACINPEAK, AMIKACINTROU, AMIKACIN in the last 72 hours.   Microbiology: Recent Results (from the past 720 hour(s))  MRSA PCR Screening     Status: None   Collection Time: 01/23/15  7:44 AM  Result Value Ref Range Status   MRSA by PCR NEGATIVE NEGATIVE Final    Comment:        The GeneXpert MRSA Assay (FDA approved for NASAL specimens only), is one component of a comprehensive MRSA colonization surveillance program. It is not intended to diagnose MRSA infection nor to guide or monitor treatment for MRSA infections.     Medical History: Past Medical History  Diagnosis Date  . Hypertension   . Coronary artery disease     Cypher stent to the RCA in 2003  . Blood transfusion     no side affects  . GERD (gastroesophageal reflux disease)   . Arthritis   . Diabetes mellitus     Borderline  . CHF (congestive heart failure)   . Shortness of breath     Medications:  Scheduled:  . allopurinol   100 mg Oral Daily  . amLODipine  5 mg Oral Daily  . aspirin EC  325 mg Oral Daily  . azithromycin  500 mg Oral Daily  . budesonide (PULMICORT) nebulizer solution  0.25 mg Nebulization BID  . cefTRIAXone (ROCEPHIN)  IV  1 g Intravenous Q24H  . chlorthalidone  25 mg Oral Daily  . doxepin  3 mg Oral QPM  . famotidine  20 mg Oral QHS  . furosemide  60 mg Intravenous Q12H  . glipiZIDE  5 mg Oral BID AC  . hydrALAZINE  25 mg Oral 3 times per day  . insulin aspart  0-9 Units Subcutaneous TID WC  . irbesartan  300 mg Oral Daily  . metoprolol  62.5 mg Oral BID  . [START ON 01/24/2015] pneumococcal 23 valent vaccine  0.5 mL Intramuscular Tomorrow-1000  . pravastatin  80 mg Oral Daily  . sodium chloride  3 mL Intravenous Q12H  . sodium chloride  3 mL Intravenous Q12H  . terbinafine  250 mg Oral Daily   Assessment: 69 yo female admitted with SOB and chest pain, wheezing, and productive cough.  Pharmacy asked to begin ceftriaxone for possible CAP.  Noted penicillin allergy.  Goal of Therapy:  Resolution of infection  Plan:  1. Ceftriaxone 1g IV q 24 hrs. 2. F/u clinical course.  Uvaldo Rising, BCPS  Clinical Pharmacist Pager [phone]  01/23/2015 9:11 PM

## 2015-01-23 NOTE — Progress Notes (Signed)
Paged Dr. Sherral Hammers to see if pt can have a diet. Awaiting call back.

## 2015-01-23 NOTE — ED Notes (Signed)
Spoke to lab concerning BNP testing; tech reports test will be added to previous lab test

## 2015-01-23 NOTE — ED Provider Notes (Signed)
CSN: [id number]     Arrival date & time 01/23/15  0026 History  This chart was scribed for Delora Fuel, MD by Einar Pheasant, ED Scribe. This patient was seen in room A07C/A07C and the patient's care was started at 12:55 AM.    Chief Complaint  Patient presents with  . Shortness of Breath  . Leg Swelling   The history is provided by the patient and medical records. No language interpreter was used.   HPI Comments: Kathleen [name] is a 69 y.o. female with PMhx of HTN, GERD, CHF, and CAD listed below presents to the Emergency Department complaining of gradual onset persistent SOB that started approximately 3 days ago. Pt states that she has noticed that her breathing is gradually worsening and is exacerbated by movement. Pt states that she is unable to lay down secondary to her SOB. She endorses associated pressure to her chest and bilateral leg swelling. Denies any productive cough, nausea, diaphoresis, emesis, fever, chills, weakness, or numbness.   Past Medical History  Diagnosis Date  . Hypertension   . Coronary artery disease     Cypher stent to the RCA in 2003  . Blood transfusion     no side affects  . GERD (gastroesophageal reflux disease)   . Arthritis   . Diabetes mellitus     Borderline  . CHF (congestive heart failure)   . Shortness of breath    Past Surgical History  Procedure Laterality Date  . Total knee arthroplasty    . Cholecystectomy    . Coronary angioplasty with stent placement     Family History  Problem Relation Age of Onset  . Hypertension Father   . Heart disease Father   . Gout Father   . Arthritis Father    History  Substance Use Topics  . Smoking status: Former Smoker -- 0.30 packs/day for 30 years    Types: Cigarettes    Quit date: 01/18/2014  . Smokeless tobacco: Never Used     Comment: Smoking cessation requested   . Alcohol Use: No   OB History    Gravida Para Term Preterm AB TAB SAB Ectopic Multiple Living   9 7 5 2 2  0 2 0 0 5      Review of Systems  Constitutional: Negative for fever and chills.  HENT: Negative for congestion.   Eyes: Negative for visual disturbance.  Respiratory: Positive for shortness of breath. Negative for cough.   Cardiovascular: Positive for leg swelling. Negative for chest pain.  Gastrointestinal: Negative for nausea, vomiting and abdominal pain.  Neurological: Negative for dizziness, weakness, light-headedness and numbness.  All other systems reviewed and are negative.  Allergies  Penicillins  Home Medications   Prior to Admission medications   Medication Sig Start Date End Date Taking? Authorizing Provider  albuterol (PROVENTIL HFA;VENTOLIN HFA) 108 (90 BASE) MCG/ACT inhaler Inhale 1-2 puffs into the lungs every 6 (six) hours as needed for wheezing or shortness of breath. 10/16/13   Janne Napoleon, NP  allopurinol (ZYLOPRIM) 100 MG tablet Take 100 mg by mouth daily.    Historical Provider, MD  aspirin EC 81 MG tablet Take 81 mg by mouth daily.      Historical Provider, MD  famotidine (PEPCID) 20 MG tablet Take 20 mg by mouth at bedtime.     Historical Provider, MD  furosemide (LASIX) 40 MG tablet Take 1.5 tablets (60 mg total) by mouth daily. 09/19/14   Barton Dubois, MD  glipiZIDE (GLUCOTROL) 5 MG tablet  Take 1 tablet (5 mg total) by mouth 2 (two) times daily before a meal. 06/19/14   Billy Fischer, MD  isosorbide-hydrALAZINE (BIDIL) 20-37.5 MG per tablet Take 1 tablet by mouth 3 (three) times daily. 09/19/14   Barton Dubois, MD  metoprolol (LOPRESSOR) 100 MG tablet Take 1 tablet (100 mg total) by mouth 2 (two) times daily. 09/19/14   Barton Dubois, MD  pravastatin (PRAVACHOL) 80 MG tablet Take 1 tablet (80 mg total) by mouth daily. 11/23/11 09/17/14  Minus Breeding, MD  telmisartan (MICARDIS) 40 MG tablet Take 1 tablet (40 mg total) by mouth daily. 09/19/14   Barton Dubois, MD   BP 193/99 mmHg  Pulse 81  Temp(Src) 98.7 F (37.1 C) (Oral)  Resp 22  Ht 5\' 4"  (1.626 m)  Wt 277 lb (125.646 kg)   BMI 47.52 kg/m2  SpO2 93%  Physical Exam  Constitutional: She is oriented to person, place, and time. She appears well-developed and well-nourished. No distress.  HENT:  Head: Normocephalic and atraumatic.  Right Ear: External ear normal.  Left Ear: External ear normal.  Eyes: Conjunctivae and EOM are normal. Pupils are equal, round, and reactive to light.  Neck: Normal range of motion. Neck supple. JVD present.  Positive JVD at 45 degrees.  Cardiovascular: Normal rate, regular rhythm and normal heart sounds.  Exam reveals no gallop and no friction rub.   No murmur heard. Pulmonary/Chest: Effort normal. No respiratory distress. She has no wheezes. She has rales.  Bibasilar rales present to ascultation.   Abdominal: Soft. Bowel sounds are normal. She exhibits no distension and no mass. There is no tenderness.  Musculoskeletal: Normal range of motion. She exhibits edema.  2+ pitting edema to bilateral lower extremities.   Lymphadenopathy:    She has no cervical adenopathy.  Neurological: She is alert and oriented to person, place, and time. No cranial nerve deficit. Coordination normal.  Skin: Skin is warm and dry. No rash noted.  Psychiatric: She has a normal mood and affect. Her behavior is normal.  Nursing note and vitals reviewed.   ED Course  Procedures (including critical care time)  DIAGNOSTIC STUDIES: Oxygen Saturation is 93% on RA, low by my interpretation.    COORDINATION OF CARE: 1:00 AM- Pt advised of plan for treatment and pt agrees.  Labs Review Results for orders placed or performed during the hospital encounter of [id number]  Basic metabolic panel  Result Value Ref Range   Sodium 139 135 - 145 mmol/L   Potassium 3.9 3.5 - 5.1 mmol/L   Chloride 103 96 - 112 mmol/L   CO2 28 19 - 32 mmol/L   Glucose, Bld 122 (H) 70 - 99 mg/dL   BUN 33 (H) 6 - 23 mg/dL   Creatinine, Ser 1.85 (H) 0.50 - 1.10 mg/dL   Calcium 9.5 8.4 - 10.5 mg/dL   GFR calc non Af Amer 26 (L)  >90 mL/min   GFR calc Af Amer 31 (L) >90 mL/min   Anion gap 8 5 - 15  BNP (order ONLY if patient complains of dyspnea/SOB AND you have documented it for THIS visit)  Result Value Ref Range   B Natriuretic Peptide 676.7 (H) 0.0 - 100.0 pg/mL  CBC with Differential  Result Value Ref Range   WBC 12.0 (H) 4.0 - 10.5 K/uL   RBC 4.27 3.87 - 5.11 MIL/uL   Hemoglobin 12.1 12.0 - 15.0 g/dL   HCT 37.1 36.0 - 46.0 %   MCV 86.9 78.0 - 100.0  fL   MCH 28.3 26.0 - 34.0 pg   MCHC 32.6 30.0 - 36.0 g/dL   RDW 16.0 (H) 11.5 - 15.5 %   Platelets 182 150 - 400 K/uL   Neutrophils Relative % 76 43 - 77 %   Neutro Abs 9.1 (H) 1.7 - 7.7 K/uL   Lymphocytes Relative 17 12 - 46 %   Lymphs Abs 2.0 0.7 - 4.0 K/uL   Monocytes Relative 5 3 - 12 %   Monocytes Absolute 0.6 0.1 - 1.0 K/uL   Eosinophils Relative 2 0 - 5 %   Eosinophils Absolute 0.2 0.0 - 0.7 K/uL   Basophils Relative 0 0 - 1 %   Basophils Absolute 0.0 0.0 - 0.1 K/uL  Troponin I  Result Value Ref Range   Troponin I 0.04 (H) <0.031 ng/mL   Imaging Review Dg Chest 2 View  01/23/2015   CLINICAL DATA:  Shortness of breath, leg swelling. History of hypertension, diabetes and CHF.  EXAM: CHEST  2 VIEW  COMPARISON:  Chest radiograph September 17, 2014  FINDINGS: The cardiac silhouette is mild to moderately enlarged, unchanged. Calcified aortic knob. Mild interstitial prominence with pulmonary vascular congestion, no pleural effusion or focal consolidation. No pneumothorax.  Mild osteopenia. Moderate degenerative change of thoracic spine. Surgical clips in the included right abdomen likely reflect cholecystectomy.  IMPRESSION: Stable cardiomegaly. Pulmonary vascular congestion and interstitial prominence most consistent with pulmonary edema.   Electronically Signed   By: Elon Alas   On: 01/23/2015 01:46     EKG Interpretation   Date/Time:  Thursday January 23 2015 00:30:35 EST Ventricular Rate:  81 PR Interval:  168 QRS Duration: 68 QT Interval:   364 QTC Calculation: 422 R Axis:   70 Text Interpretation:  Sinus rhythm with Premature atrial complexes  Anteroseptal infarct , age undetermined Abnormal ECG When compared with  ECG of 09/18/2014, Premature atrial complexes are now Present Confirmed by  [facility]  MD, Naijah Lacek ([id number]) on 01/23/2015 12:42:28 AM      MDM   Final diagnoses:  CHF exacerbation  Renal insufficiency  Elevated troponin    Progressive dyspnea with peripheral edema suggestive of CHF exacerbation. Old records are reviewed and she had been admitted for CHF exacerbation 4 months ago. BNP has come back elevated and chest x-ray shows evidence of heart failure. Troponin is mildly elevated. This is most likely demand ischemia given the symptoms are several days old. However, she is started on heparin just in case this is part of an acute coronary syndrome. She is given furosemide and aspirin. Case is discussed with Dr. Hal Hope of [facility] who agrees to admit the patient.  I personally performed the services described in this documentation, which was scribed in my presence. The recorded information has been reviewed and is accurate.      Delora Fuel, MD [id number]

## 2015-01-23 NOTE — ED Notes (Signed)
Dr. Karkandy, hospitalist, at the bedside.  

## 2015-01-23 NOTE — ED Notes (Signed)
Diaz (daughter) cell phone number if needed, please contact.

## 2015-01-23 NOTE — H&P (Signed)
[facility] History and Physical  Kathleen [name] [id number] DOB: November 12, 1946 DOA: 01/23/2015  Referring physician: ER physician. PCP: Philis Fendt, MD   Chief Complaint: Chest pain and shortness of breath.  HPI: Kathleen [name] is a 69 y.o. female with history of CAD status post stenting, CHF last EF measured in October 2015 was 60-65%, hypertension, diabetes mellitus type 2, chronic kidney disease presents to the ER because of worsening shortness of breath and chest pain. Patient states her symptoms started 3-4 days ago with shortness of breath wheezing and productive cough. Denies any fever or chills. Over the last 2 days patient also has been having progressively worsening chest pain which is retrosternal nonradiating pressure-like. In the ER patient was found to have lower extremity edema and on exam has bilateral expiratory wheeze. Chest x-ray shows congestion and interstitial edema. Troponin is mildly positive. Patient has been admitted for further management of her shortness of breath which at this time probably a combination of CHF and COPD with non-ST elevation MI. Patient denies any nausea vomiting abdominal pain diarrhea.   Review of Systems: As presented in the history of presenting illness, rest negative.  Past Medical History  Diagnosis Date  . Hypertension   . Coronary artery disease     Cypher stent to the RCA in 2003  . Blood transfusion     no side affects  . GERD (gastroesophageal reflux disease)   . Arthritis   . Diabetes mellitus     Borderline  . CHF (congestive heart failure)   . Shortness of breath    Past Surgical History  Procedure Laterality Date  . Total knee arthroplasty    . Cholecystectomy    . Coronary angioplasty with stent placement     Social History:  reports that she quit smoking about a year ago. Her smoking use included Cigarettes. She has a 9 pack-year smoking history. She has never used smokeless tobacco. She reports that she  does not drink alcohol or use illicit drugs. Where does patient live home. Can patient participate in ADLs? Yes.  Allergies  Allergen Reactions  . Penicillins Other (See Comments)    unknown    Family History:  Family History  Problem Relation Age of Onset  . Hypertension Father   . Heart disease Father   . Gout Father   . Arthritis Father       Prior to Admission medications   Medication Sig Start Date End Date Taking? Authorizing Provider  albuterol (PROVENTIL HFA;VENTOLIN HFA) 108 (90 BASE) MCG/ACT inhaler Inhale 1-2 puffs into the lungs every 6 (six) hours as needed for wheezing or shortness of breath. 10/16/13  Yes Janne Napoleon, NP  allopurinol (ZYLOPRIM) 100 MG tablet Take 100 mg by mouth daily.   Yes Historical Provider, MD  aspirin EC 81 MG tablet Take 81 mg by mouth daily.     Yes Historical Provider, MD  chlorthalidone (HYGROTON) 25 MG tablet Take 25 mg by mouth daily.   Yes Historical Provider, MD  colchicine 0.6 MG tablet Take 0.6 mg by mouth daily as needed (for gout flare ups).    Yes Historical Provider, MD  Doxepin HCl 3 MG TABS Take 1 tablet by mouth every evening.   Yes Historical Provider, MD  famotidine (PEPCID) 20 MG tablet Take 20 mg by mouth at bedtime.    Yes Historical Provider, MD  furosemide (LASIX) 40 MG tablet Take 1.5 tablets (60 mg total) by mouth daily. 09/19/14  Yes Barton Dubois, MD  glipiZIDE (GLUCOTROL) 5 MG tablet Take 1 tablet (5 mg total) by mouth 2 (two) times daily before a meal. 06/19/14  Yes Billy Fischer, MD  isosorbide-hydrALAZINE (BIDIL) 20-37.5 MG per tablet Take 1 tablet by mouth 3 (three) times daily. 09/19/14  Yes Barton Dubois, MD  metoprolol (LOPRESSOR) 50 MG tablet Take 1 tablet by mouth 2 (two) times daily. 01/07/15  Yes Historical Provider, MD  pravastatin (PRAVACHOL) 80 MG tablet Take 80 mg by mouth daily.   Yes Historical Provider, MD  telmisartan (MICARDIS) 80 MG tablet Take 80 mg by mouth daily.   Yes Historical Provider, MD   terbinafine (LAMISIL) 250 MG tablet Take 250 mg by mouth daily. 12/30/14  Yes Historical Provider, MD  metoprolol (LOPRESSOR) 100 MG tablet Take 1 tablet (100 mg total) by mouth 2 (two) times daily. Patient not taking: Reported on 01/23/2015 09/19/14   Barton Dubois, MD  pravastatin (PRAVACHOL) 80 MG tablet Take 1 tablet (80 mg total) by mouth daily. 11/23/11 09/17/14  Minus Breeding, MD  telmisartan (MICARDIS) 40 MG tablet Take 1 tablet (40 mg total) by mouth daily. Patient not taking: Reported on 01/23/2015 09/19/14   Barton Dubois, MD    Physical Exam: Filed Vitals:   01/23/15 0247 01/23/15 0300 01/23/15 0330 01/23/15 0338  BP: 181/75 179/64 164/82 164/82  Pulse: 84 79 79 83  Temp:      TempSrc:      Resp: 31 30 24 17   Height:      Weight:      SpO2: 97% 95% 97% 95%     General:  Obese not in distress.  Eyes: Anicteric no pallor.  ENT: No discharge from ears eyes nose or mouth.  Neck: No elevated JVD no mass felt.  Cardiovascular: S1-S2 heard.  Respiratory: Bilateral expiratory wheeze and no crepitations.  Abdomen: Mildly distended nontender bowel sounds present no guarding or rigidity.  Skin: No rash.  Musculoskeletal: Bilateral lower extremity edema.  Psychiatric: Appears normal.  Neurologic: Alert awake oriented to time place and person. Moves all extremities.  Labs on Admission:  Basic Metabolic Panel:  Recent Labs Lab 01/23/15 0103  NA 139  K 3.9  CL 103  CO2 28  GLUCOSE 122*  BUN 33*  CREATININE 1.85*  CALCIUM 9.5   Liver Function Tests: No results for input(s): AST, ALT, ALKPHOS, BILITOT, PROT, ALBUMIN in the last 168 hours. No results for input(s): LIPASE, AMYLASE in the last 168 hours. No results for input(s): AMMONIA in the last 168 hours. CBC:  Recent Labs Lab 01/23/15 0101  WBC 12.0*  NEUTROABS 9.1*  HGB 12.1  HCT 37.1  MCV 86.9  PLT 182   Cardiac Enzymes:  Recent Labs Lab 01/23/15 0101  TROPONINI 0.04*    BNP (last 3  results)  Recent Labs  01/23/15 0101  BNP 676.7*    ProBNP (last 3 results)  Recent Labs  04/07/14 1609 09/17/14 0544  PROBNP 1679.0* 4469.0*    CBG: No results for input(s): GLUCAP in the last 168 hours.  Radiological Exams on Admission: Dg Chest 2 View  01/23/2015   CLINICAL DATA:  Shortness of breath, leg swelling. History of hypertension, diabetes and CHF.  EXAM: CHEST  2 VIEW  COMPARISON:  Chest radiograph September 17, 2014  FINDINGS: The cardiac silhouette is mild to moderately enlarged, unchanged. Calcified aortic knob. Mild interstitial prominence with pulmonary vascular congestion, no pleural effusion or focal consolidation. No pneumothorax.  Mild osteopenia. Moderate degenerative change of thoracic spine. Surgical clips  in the included right abdomen likely reflect cholecystectomy.  IMPRESSION: Stable cardiomegaly. Pulmonary vascular congestion and interstitial prominence most consistent with pulmonary edema.   Electronically Signed   By: Elon Alas   On: 01/23/2015 01:46    EKG: Independently reviewed. Normal sinus rhythm with atrial premature complexes.  Assessment/Plan Principal Problem:   CHF exacerbation Active Problems:   Essential hypertension   Non-ST elevation MI (NSTEMI)   COPD exacerbation   Diabetes mellitus type 2, controlled   CHF (congestive heart failure)   1. Shortness of breath mostly combination of CHF and COPD - patient last here measured in October 2015 was 60-65%. At this time I have continued patient on Lasix 60 mg IV every 12 hourly patient did receive 1 dose in the ER. In addition patient has been placed on nebulizer and Pulmicort for COPD. Closely follow intake output daily weights and metabolic panel. Add Zithromax for COPD. 2. Non-ST elevation MI - I have discussed with on-call cardiologist Dr. Rosanna Randy who will be seeing patient in consult. Patient at this time still has some chest pain for which I have placed patient on nitroglycerin  infusion and we will be continuing on heparin infusion. Patient is on beta blocker statins and aspirin which will be continued. Cycle cardiac markers check 2-D echo and patient will be kept nothing by mouth in anticipation of possible cardiac procedures. 3. Diabetes mellitus type 2 controlled - continue home medication and since patient is nothing by mouth closely follow CBGs. 4. Hypertension - since patient is on nitroglycerin infusion at this time we will hold off patient's Biidil but I have placed patient on hydralazine orally which can be changed back to BIDIL once patient's nitroglycerin infusion is discontinued. 5. Chronic kidney disease stage III - creatinine appears to be at baseline. 6. Hyperlipidemia - on statins.   DVT Prophylaxis patient is on full dose heparin infusion.  Code Status: Full code.  Family Communication: Patient's daughter at the bedside.  Disposition Plan: Admit to inpatient.    Christasia Angeletti N. [facility] Pager [phone].  If 7PM-7AM, please contact night-coverage [email or web address] Password [facility] 01/23/2015, 4:34 AM

## 2015-01-24 ENCOUNTER — Inpatient Hospital Stay (HOSPITAL_COMMUNITY): Payer: Medicare Other

## 2015-01-24 DIAGNOSIS — I10 Essential (primary) hypertension: Secondary | ICD-10-CM

## 2015-01-24 DIAGNOSIS — R7989 Other specified abnormal findings of blood chemistry: Secondary | ICD-10-CM

## 2015-01-24 DIAGNOSIS — I5031 Acute diastolic (congestive) heart failure: Secondary | ICD-10-CM

## 2015-01-24 DIAGNOSIS — R778 Other specified abnormalities of plasma proteins: Secondary | ICD-10-CM

## 2015-01-24 DIAGNOSIS — N183 Chronic kidney disease, stage 3 (moderate): Secondary | ICD-10-CM

## 2015-01-24 DIAGNOSIS — R079 Chest pain, unspecified: Secondary | ICD-10-CM

## 2015-01-24 LAB — COMPREHENSIVE METABOLIC PANEL
ALBUMIN: 3 g/dL — AB (ref 3.5–5.2)
ALT: 19 U/L (ref 0–35)
ANION GAP: 14 (ref 5–15)
AST: 27 U/L (ref 0–37)
Alkaline Phosphatase: 76 U/L (ref 39–117)
BUN: 39 mg/dL — ABNORMAL HIGH (ref 6–23)
CHLORIDE: 102 mmol/L (ref 96–112)
CO2: 25 mmol/L (ref 19–32)
Calcium: 9.2 mg/dL (ref 8.4–10.5)
Creatinine, Ser: 1.96 mg/dL — ABNORMAL HIGH (ref 0.50–1.10)
GFR calc Af Amer: 29 mL/min — ABNORMAL LOW (ref >90)
GFR calc non Af Amer: 25 mL/min — ABNORMAL LOW (ref >90)
Glucose, Bld: 119 mg/dL — ABNORMAL HIGH (ref 70–99)
Potassium: 4.1 mmol/L (ref 3.5–5.1)
Sodium: 141 mmol/L (ref 135–145)
Total Bilirubin: 1.1 mg/dL (ref 0.3–1.2)
Total Protein: 7 g/dL (ref 6.0–8.3)

## 2015-01-24 LAB — CBC
HCT: 35.1 % — ABNORMAL LOW (ref 36.0–46.0)
Hemoglobin: 11.7 g/dL — ABNORMAL LOW (ref 12.0–15.0)
MCH: 29 pg (ref 26.0–34.0)
MCHC: 33.3 g/dL (ref 30.0–36.0)
MCV: 86.9 fL (ref 78.0–100.0)
Platelets: 169 10*3/uL (ref 150–400)
RBC: 4.04 MIL/uL (ref 3.87–5.11)
RDW: 15.9 % — AB (ref 11.5–15.5)
WBC: 9.5 10*3/uL (ref 4.0–10.5)

## 2015-01-24 LAB — GLUCOSE, CAPILLARY
GLUCOSE-CAPILLARY: 96 mg/dL (ref 70–99)
Glucose-Capillary: 86 mg/dL (ref 70–99)
Glucose-Capillary: 76 mg/dL (ref 70–99)
Glucose-Capillary: 127 mg/dL — ABNORMAL HIGH (ref 70–99)

## 2015-01-24 LAB — HEMOGLOBIN A1C
HEMOGLOBIN A1C: 6.5 % — AB (ref 4.8–5.6)
MEAN PLASMA GLUCOSE: 140 mg/dL

## 2015-01-24 LAB — HEPARIN LEVEL (UNFRACTIONATED)
HEPARIN UNFRACTIONATED: 0.6 [IU]/mL (ref 0.30–0.70)
Heparin Unfractionated: 0.24 IU/mL — ABNORMAL LOW (ref 0.30–0.70)

## 2015-01-24 MED ORDER — TECHNETIUM TC 99M SESTAMIBI GENERIC - CARDIOLITE: 30.0000 | Freq: Once | INTRAVENOUS | Status: AC | PRN

## 2015-01-24 MED ORDER — TECHNETIUM TC 99M SESTAMIBI GENERIC - CARDIOLITE
30.0000 | Freq: Once | INTRAVENOUS | Status: AC | PRN
  Administered 2015-01-24: 30 via INTRAVENOUS

## 2015-01-24 MED ORDER — HEPARIN SODIUM (PORCINE) 5000 UNIT/ML IJ SOLN
5000.0000 [IU] | Freq: Three times a day (TID) | INTRAMUSCULAR | Status: DC
  Administered 2015-01-24 – 2015-01-25 (×2): 5000 [IU] via SUBCUTANEOUS
  Filled 2015-01-24 (×5): qty 1

## 2015-01-24 MED ORDER — IRBESARTAN 150 MG PO TABS
150.0000 mg | ORAL_TABLET | Freq: Every day | ORAL | Status: DC
  Administered 2015-01-25: 150 mg via ORAL
  Filled 2015-01-24: qty 1

## 2015-01-24 MED ORDER — METOPROLOL TARTRATE 50 MG PO TABS
75.0000 mg | ORAL_TABLET | Freq: Two times a day (BID) | ORAL | Status: DC
  Administered 2015-01-24 – 2015-01-25 (×2): 75 mg via ORAL
  Filled 2015-01-24 (×3): qty 1

## 2015-01-24 MED ORDER — ISOSORB DINITRATE-HYDRALAZINE 20-37.5 MG PO TABS
1.0000 | ORAL_TABLET | Freq: Three times a day (TID) | ORAL | Status: DC
Start: 2015-01-24 — End: 2015-01-28
  Administered 2015-01-24 – 2015-01-28 (×8): 1 via ORAL
  Filled 2015-01-24 (×14): qty 1

## 2015-01-24 NOTE — Progress Notes (Signed)
[place] TEAM 1 - Stepdown/ICU TEAM Progress Note  Kathleen [name] [id number] DOB: [birth year] DOA: 01/23/2015 PCP: Philis Fendt, MD  Admit HPI / Brief Narrative: Kathleen [name] is a 69 y.o. BF PMHx CAD status post stenting, CHF last EF measured in October 2015 was 60-65%, hypertension, diabetes mellitus type 2, chronic kidney disease Presents to the ER because of worsening shortness of breath and chest pain. Patient states her symptoms started 3-4 days ago with shortness of breath wheezing and productive cough. Denies any fever or chills. Over the last 2 days patient also has been having progressively worsening chest pain which is retrosternal nonradiating pressure-like. In the ER patient was found to have lower extremity edema and on exam has bilateral expiratory wheeze. Chest x-ray shows congestion and interstitial edema. Troponin is mildly positive. Patient has been admitted for further management of her shortness of breath which at this time probably a combination of CHF and COPD with non-ST elevation MI. Patient denies any nausea vomiting abdominal pain diarrhea.   HPI/Subjective: 2/12 A/O 4, states chest pain has resolved, positive continued SOB. States negative home O2 requirement  Assessment/Plan: Shortness of breath/ COPD exacerbation/CAP - mostly likely combination of CHF, smoking and COPD -Hold Lasix patient's Cr trending up may be at her new baseline weight.  -Azithromycin + ceftriaxone; complete 7 day course  -PCXR in a.m.  Non-ST elevation MI  - S/P first part of Lexiscan completed today, second part to be completed in the a.m.   Chronic diastolic CHF -DC Amlodipine 5 mg daily -Aspirin 325 mg daily -DC chlorthalidone -Lasix 60 mg BID on hold, until we see trend up patient's creatinine -Patient artery on  Bidil 20-30 7.5 TID, DC Hydralazine 25 mg TID -Decrease Irbesartan 150 mg Daily secondary to increasing Cr -Increase metoprolol 75 mg BID -Strict in and out  since admission; -781ml -A.m. Weight; admission weight= 125.5 kg                    2/12 weight= 123.3 kg  Diabetes mellitus type 2 controlled  -2/11 hemoglobin A1c= 6.5 - Continue glipizide 5 mg BID -Sensitive SSI  Hypertension  - See chronic diastolic CHF   Chronic kidney disease stage III  - creatinine appears to be at baseline.  Hyperlipidemia  -Continue Pravachol 80 mg daily  -Obtain lipid panel    Code Status: FULL Family Communication: no family present at time of exam Disposition Plan: Per cardiology   Consultants: Skip Mayer (cardiology)  Procedure/Significant Events: 2/11 echocardiogram;- LVEF= 55% to 60%. - (grade 2 diastolic dysfunction).   Culture   Antibiotics: Azithromycin 2/11>> Ceftriaxone 2/11>>   DVT prophylaxis: Subcutaneous heparin   Devices    LINES / TUBES:      Continuous Infusions: . heparin 1,250 Units/hr (01/24/15 0309)    Objective: VITAL SIGNS: Temp: 97.3 F (36.3 C) (02/12 1550) Temp Source: Oral (02/12 1550) BP: 116/81 mmHg (02/12 1550) Pulse Rate: 55 (02/12 1550) SPO2; FIO2:   Intake/Output Summary (Last 24 hours) at 01/24/15 1920 Last data filed at 01/24/15 1000  Gross per 24 hour  Intake  249.5 ml  Output    350 ml  Net -100.5 ml     Exam: General: A/O 4, NAD, No acute respiratory distress Lungs: Clear to auscultation bilaterally, except for decreased breath sounds bibasilar, without wheezes or crackles Cardiovascular: Irregular irregular rhythm and rate without murmur gallop or rub normal S1 and S2 Abdomen: Nontender, nondistended, soft, bowel sounds positive, no rebound,  no ascites, no appreciable mass Extremities: No significant cyanosis, clubbing, or edema bilateral lower extremities  Data Reviewed: Basic Metabolic Panel:  Recent Labs Lab 01/23/15 0103 01/23/15 0703 01/24/15 0137  NA 139 141 141  K 3.9 3.7 4.1  CL 103 104 102  CO2 28 24 25   GLUCOSE 122* 102* 119*  BUN 33* 32*  39*  CREATININE 1.85* 1.71* 1.96*  CALCIUM 9.5 9.2 9.2   Liver Function Tests:  Recent Labs Lab 01/24/15 0137  AST 27  ALT 19  ALKPHOS 76  BILITOT 1.1  PROT 7.0  ALBUMIN 3.0*   No results for input(s): LIPASE, AMYLASE in the last 168 hours. No results for input(s): AMMONIA in the last 168 hours. CBC:  Recent Labs Lab 01/23/15 0101 01/23/15 0703 01/24/15 0137  WBC 12.0* 12.1* 9.5  NEUTROABS 9.1*  --   --   HGB 12.1 11.5* 11.7*  HCT 37.1 35.8* 35.1*  MCV 86.9 88.0 86.9  PLT 182 188 169   Cardiac Enzymes:  Recent Labs Lab 01/23/15 0101 01/23/15 0703 01/23/15 1250 01/23/15 1845  TROPONINI 0.04* 0.06* 0.07* 0.09*   BNP (last 3 results)  Recent Labs  01/23/15 0101  BNP 676.7*    ProBNP (last 3 results)  Recent Labs  04/07/14 1609 09/17/14 0544  PROBNP 1679.0* 4469.0*    CBG:  Recent Labs Lab 01/23/15 1807 01/23/15 2137 01/24/15 0726 01/24/15 1229 01/24/15 1549  GLUCAP 108* 117* 86 76 127*    Recent Results (from the past 240 hour(s))  MRSA PCR Screening     Status: None   Collection Time: 01/23/15  7:44 AM  Result Value Ref Range Status   MRSA by PCR NEGATIVE NEGATIVE Final    Comment:        The GeneXpert MRSA Assay (FDA approved for NASAL specimens only), is one component of a comprehensive MRSA colonization surveillance program. It is not intended to diagnose MRSA infection nor to guide or monitor treatment for MRSA infections.      Studies:  Recent x-ray studies have been reviewed in detail by the Attending Physician  Scheduled Meds:  Scheduled Meds: . allopurinol  100 mg Oral Daily  . amLODipine  5 mg Oral Daily  . aspirin EC  325 mg Oral Daily  . azithromycin  500 mg Oral Daily  . budesonide (PULMICORT) nebulizer solution  0.25 mg Nebulization BID  . cefTRIAXone (ROCEPHIN)  IV  1 g Intravenous Q24H  . chlorthalidone  25 mg Oral Daily  . doxepin  3 mg Oral QPM  . famotidine  20 mg Oral QHS  . glipiZIDE  5 mg Oral  BID AC  . insulin aspart  0-9 Units Subcutaneous TID WC  . irbesartan  300 mg Oral Daily  . isosorbide-hydrALAZINE  1 tablet Oral TID  . metoprolol  62.5 mg Oral BID  . pneumococcal 23 valent vaccine  0.5 mL Intramuscular Tomorrow-1000  . pravastatin  80 mg Oral Daily  . sodium chloride  3 mL Intravenous Q12H  . sodium chloride  3 mL Intravenous Q12H  . terbinafine  250 mg Oral Daily    Time spent on care of this patient: 40 mins   Allie Bossier [facility]  [facility] Office  [phone] Pager - [phone]  On-Call/Text Page:      [email or web address]      password [email or web address]  If 7PM-7AM, please contact night-coverage [email or web address] Password [email or web address] 01/24/2015, 7:20 PM   LOS: 1 day   Care during the described time  interval was provided by me .  I have reviewed this patient's available data, including medical history, events of note, physical examination, radiology studies and test results as part of my evaluation  Dia Crawford, MD [phone] Pager

## 2015-01-24 NOTE — Progress Notes (Signed)
Patient Name: Kathleen [name] Date of Encounter: 01/24/2015  Principal Problem:   CHF exacerbation Active Problems:   Essential hypertension   Non-ST elevation MI (NSTEMI)   COPD exacerbation   Diabetes mellitus type 2, controlled   CHF (congestive heart failure)   CAP (community acquired pneumonia)   HLD (hyperlipidemia)   Chronic kidney disease, stage III (moderate)   Length of Stay: 1  SUBJECTIVE  No further chest pain Good BP control Edema better Normal nuclear stress test Echo c/w diastolic dysfunction and volume overload  CURRENT MEDS . allopurinol  100 mg Oral Daily  . amLODipine  5 mg Oral Daily  . aspirin EC  325 mg Oral Daily  . azithromycin  500 mg Oral Daily  . budesonide (PULMICORT) nebulizer solution  0.25 mg Nebulization BID  . cefTRIAXone (ROCEPHIN)  IV  1 g Intravenous Q24H  . chlorthalidone  25 mg Oral Daily  . doxepin  3 mg Oral QPM  . famotidine  20 mg Oral QHS  . glipiZIDE  5 mg Oral BID AC  . insulin aspart  0-9 Units Subcutaneous TID WC  . irbesartan  300 mg Oral Daily  . isosorbide-hydrALAZINE  1 tablet Oral TID  . metoprolol  62.5 mg Oral BID  . pneumococcal 23 valent vaccine  0.5 mL Intramuscular Tomorrow-1000  . pravastatin  80 mg Oral Daily  . sodium chloride  3 mL Intravenous Q12H  . sodium chloride  3 mL Intravenous Q12H  . terbinafine  250 mg Oral Daily    OBJECTIVE   Intake/Output Summary (Last 24 hours) at 01/24/15 1443 Last data filed at 01/24/15 1000  Gross per 24 hour  Intake    313 ml  Output    350 ml  Net    -37 ml   Filed Weights   01/23/15 0035 01/23/15 0604 01/24/15 0404  Weight: 125.646 kg (277 lb) 125.5 kg (276 lb 10.8 oz) 123.3 kg (271 lb 13.2 oz)    PHYSICAL EXAM Filed Vitals:   01/24/15 0624 01/24/15 0700 01/24/15 0708 01/24/15 1232  BP: 140/72  123/76 130/97  Pulse:   90 137  Temp:  97.6 F (36.4 C)  98.3 F (36.8 C)  TempSrc:  Oral  Oral  Resp:   20 21  Height:      Weight:      SpO2:   100%  99%   General: Alert, oriented x3, no distress Head: no evidence of trauma, PERRL, EOMI, no exophtalmos or lid lag, no myxedema, no xanthelasma; normal ears, nose and oropharynx Neck: normal jugular venous pulsations and no hepatojugular reflux; brisk carotid pulses without delay and no carotid bruits Chest: clear to auscultation, no signs of consolidation by percussion or palpation, normal fremitus, symmetrical and full respiratory excursions Cardiovascular: normal position and quality of the apical impulse, regular rhythm, normal first and second heart sounds, no rubs or gallops, 2/6 systolic aortic murmur Abdomen: no tenderness or distention, no masses by palpation, no abnormal pulsatility or arterial bruits, normal bowel sounds, no hepatosplenomegaly Extremities: no clubbing, cyanosis or edema; 2+ radial, ulnar and brachial pulses bilaterally; 2+ right femoral, posterior tibial and dorsalis pedis pulses; 2+ left femoral, posterior tibial and dorsalis pedis pulses; no subclavian or femoral bruits Neurological: grossly nonfocal  LABS  CBC  Recent Labs  01/23/15 0101 01/23/15 0703 01/24/15 0137  WBC 12.0* 12.1* 9.5  NEUTROABS 9.1*  --   --   HGB 12.1 11.5* 11.7*  HCT 37.1 35.8* 35.1*  MCV 86.9  88.0 86.9  PLT 182 188 [id number]   Basic Metabolic Panel  Recent Labs  01/23/15 0703 01/24/15 0137  NA 141 141  K 3.7 4.1  CL 104 102  CO2 24 25  GLUCOSE 102* 119*  BUN 32* 39*  CREATININE 1.71* 1.96*  CALCIUM 9.2 9.2   Liver Function Tests  Recent Labs  01/24/15 0137  AST 27  ALT 19  ALKPHOS 76  BILITOT 1.1  PROT 7.0  ALBUMIN 3.0*   No results for input(s): LIPASE, AMYLASE in the last 72 hours. Cardiac Enzymes  Recent Labs  01/23/15 0703 01/23/15 1250 01/23/15 1845  TROPONINI 0.06* 0.07* 0.09*   BNP Invalid input(s): POCBNP D-Dimer No results for input(s): DDIMER in the last 72 hours. Hemoglobin A1C  Recent Labs  01/23/15 0703  HGBA1C 6.5*   Fasting Lipid  Panel No results for input(s): CHOL, HDL, LDLCALC, TRIG, CHOLHDL, LDLDIRECT in the last 72 hours. Thyroid Function Tests  Recent Labs  01/23/15 0703  TSH 1.156    ASSESSMENT AND PLAN   No need for additional cardiac workup for CAD. AS is mild and not yet hemodynamically relevant Will probably do better on loop diuretic rather than thiazide, but needs close f/u of K and creatinine levels Recommend regular Nephrology f/u as outpatient Will make arrangements for Cardiology f/u   Sanda Klein, MD, [facility] [phone] office [phone] pager 01/24/2015 2:43 PM

## 2015-01-24 NOTE — Progress Notes (Signed)
ANTICOAGULATION CONSULT NOTE - Follow Up Consult  Pharmacy Consult for heparin Indication: chest pain/ACS  Labs:  Recent Labs  01/23/15 0101 01/23/15 0103 01/23/15 0703 01/23/15 1212 01/23/15 1250 01/23/15 1845 01/24/15 0137  HGB 12.1  --  11.5*  --   --   --  11.7*  HCT 37.1  --  35.8*  --   --   --  35.1*  PLT 182  --  188  --   --   --  169  HEPARINUNFRC  --   --   --  0.83*  --   --  0.24*  CREATININE  --  1.85* 1.71*  --   --   --  1.96*  TROPONINI 0.04*  --  0.06*  --  0.07* 0.09*  --      Assessment: 69yo female now subtherapeutic on heparin after rate decrease for level above goal.  Goal of Therapy:  Heparin level 0.3-0.7 units/ml   Plan:  Will increase heparin gtt to between prior rates at 1250 units/hr and check level in [place], PharmD, BCPS  01/24/2015,3:09 AM

## 2015-01-24 NOTE — Progress Notes (Addendum)
ANTICOAGULATION CONSULT NOTE - Follow Up Consult  Pharmacy Consult for heparin Indication: chest pain/ACS  Labs:  Recent Labs  01/23/15 0101 01/23/15 0103 01/23/15 0703 01/23/15 1212 01/23/15 1250 01/23/15 1845 01/24/15 0137 01/24/15 1050  HGB 12.1  --  11.5*  --   --   --  11.7*  --   HCT 37.1  --  35.8*  --   --   --  35.1*  --   PLT 182  --  188  --   --   --  169  --   HEPARINUNFRC  --   --   --  0.83*  --   --  0.24* 0.60  CREATININE  --  1.85* 1.71*  --   --   --  1.96*  --   TROPONINI 0.04*  --  0.06*  --  0.07* 0.09*  --   --      Assessment: 69yo female now therapeutic on heparin after rate increase. CBC stable.  No bleeding reported.   Goal of Therapy:  Heparin level 0.3-0.7 units/ml   Plan:  -continue heparin at 1250 units/hr -daily HL, CBC - I changed hydralazine back to home bidil since pt off nitro drip  Eudelia Bunch, Pharm.D. [id number] 01/24/2015 1:17 PM  Addendum  No further work up for CAD. Changing IV heparin to SQ.  Plan  Heparin 5k SQ TID Rx sign off  Onnie Boer, PharmD Pager: [phone] 01/24/2015 7:44 PM

## 2015-01-24 NOTE — Progress Notes (Signed)
[place] TEAM 1 - Stepdown/ICU TEAM Progress Note  AVALEI BELD [id number] DOB: December 07, 1946 DOA: 01/23/2015 PCP: Philis Fendt, MD  Admit HPI / Brief Narrative: Kathleen [name] is a 69 y.o. BF PMHx CAD status post stenting, CHF last EF measured in October 2015 was 60-65%, hypertension, diabetes mellitus type 2, chronic kidney disease Presents to the ER because of worsening shortness of breath and chest pain. Patient states her symptoms started 3-4 days ago with shortness of breath wheezing and productive cough. Denies any fever or chills. Over the last 2 days patient also has been having progressively worsening chest pain which is retrosternal nonradiating pressure-like. In the ER patient was found to have lower extremity edema and on exam has bilateral expiratory wheeze. Chest x-ray shows congestion and interstitial edema. Troponin is mildly positive. Patient has been admitted for further management of her shortness of breath which at this time probably a combination of CHF and COPD with non-ST elevation MI. Patient denies any nausea vomiting abdominal pain diarrhea.   HPI/Subjective: 2/11 A/O 4, states chest pain has resolved, positive continued SOB. States negative home O2 requirement  Assessment/Plan: Shortness of breath/ COPD exacerbation/CAP - mostly combination of CHF and COPD - patient last here measured in October 2015 was 60-65%. At this time I have continued patient on Lasix 60 mg IV every 12 hourly patient did receive 1 dose in the ER. In addition patient has been placed on nebulizer and Pulmicort for COPD. Closely follow intake output daily weights and metabolic panel.  -Azithromycin  Non-ST elevation MI  - S/P first part of Lexiscan completed today, second part to be completed in the a.m.   Chronic diastolic CHF -Amlodipine 5 mg daily -Aspirin 325 mg daily -Chlorthalidone 25 mg daily -Lasix 60 mg BID -Hydralazine 25 mg TID -Irbesartan 300 mg Daily  Diabetes  mellitus type 2 controlled  - continue home medication and since patient is nothing by mouth closely follow CBGs.  Hypertension  - since patient is on nitroglycerin infusion at this time we will hold off patient's Biidil but I have placed patient on hydralazine orally which can be changed back to BIDIL once patient's nitroglycerin infusion is discontinued.  Chronic kidney disease stage III  - creatinine appears to be at baseline.  Hyperlipidemia  -on statins.  Code Status: FULL Family Communication: no family present at time of exam Disposition Plan: Per cardiology  Consultants: Skip Mayer (cardiology)  Procedure/Significant Events: 2/11 echocardiogram;- LVEF= 55% to 60%. - (grade 2 diastolic dysfunction).   Culture   Antibiotics: Azithromycin 2/11>> Ceftriaxone 2/11>>  DVT prophylaxis: Heparin drip   Devices    LINES / TUBES:      Continuous Infusions: . heparin 1,250 Units/hr (01/24/15 0309)    Objective: VITAL SIGNS: Temp: 97.3 F (36.3 C) (02/12 1550) Temp Source: Oral (02/12 1550) BP: 116/81 mmHg (02/12 1550) Pulse Rate: 55 (02/12 1550) SPO2; FIO2:   Intake/Output Summary (Last 24 hours) at 01/24/15 1901 Last data filed at 01/24/15 1000  Gross per 24 hour  Intake  249.5 ml  Output    350 ml  Net -100.5 ml     Exam: General: A/O 4, NAD, No acute respiratory distress Lungs: Clear to auscultation bilaterally without wheezes or crackles Cardiovascular: Regular rate and rhythm without murmur gallop or rub normal S1 and S2 Abdomen: Nontender, nondistended, soft, bowel sounds positive, no rebound, no ascites, no appreciable mass Extremities: No significant cyanosis, clubbing, or edema bilateral lower extremities  Data Reviewed: Basic  Metabolic Panel:  Recent Labs Lab 01/23/15 0103 01/23/15 0703 01/24/15 0137  NA 139 141 141  K 3.9 3.7 4.1  CL 103 104 102  CO2 28 24 25   GLUCOSE 122* 102* 119*  BUN 33* 32* 39*  CREATININE  1.85* 1.71* 1.96*  CALCIUM 9.5 9.2 9.2   Liver Function Tests:  Recent Labs Lab 01/24/15 0137  AST 27  ALT 19  ALKPHOS 76  BILITOT 1.1  PROT 7.0  ALBUMIN 3.0*   No results for input(s): LIPASE, AMYLASE in the last 168 hours. No results for input(s): AMMONIA in the last 168 hours. CBC:  Recent Labs Lab 01/23/15 0101 01/23/15 0703 01/24/15 0137  WBC 12.0* 12.1* 9.5  NEUTROABS 9.1*  --   --   HGB 12.1 11.5* 11.7*  HCT 37.1 35.8* 35.1*  MCV 86.9 88.0 86.9  PLT 182 188 169   Cardiac Enzymes:  Recent Labs Lab 01/23/15 0101 01/23/15 0703 01/23/15 1250 01/23/15 1845  TROPONINI 0.04* 0.06* 0.07* 0.09*   BNP (last 3 results)  Recent Labs  01/23/15 0101  BNP 676.7*    ProBNP (last 3 results)  Recent Labs  04/07/14 1609 09/17/14 0544  PROBNP 1679.0* 4469.0*    CBG:  Recent Labs Lab 01/23/15 1807 01/23/15 2137 01/24/15 0726 01/24/15 1229 01/24/15 1549  GLUCAP 108* 117* 86 76 127*    Recent Results (from the past 240 hour(s))  MRSA PCR Screening     Status: None   Collection Time: 01/23/15  7:44 AM  Result Value Ref Range Status   MRSA by PCR NEGATIVE NEGATIVE Final    Comment:        The GeneXpert MRSA Assay (FDA approved for NASAL specimens only), is one component of a comprehensive MRSA colonization surveillance program. It is not intended to diagnose MRSA infection nor to guide or monitor treatment for MRSA infections.      Studies:  Recent x-ray studies have been reviewed in detail by the Attending Physician  Scheduled Meds:  Scheduled Meds: . allopurinol  100 mg Oral Daily  . amLODipine  5 mg Oral Daily  . aspirin EC  325 mg Oral Daily  . azithromycin  500 mg Oral Daily  . budesonide (PULMICORT) nebulizer solution  0.25 mg Nebulization BID  . cefTRIAXone (ROCEPHIN)  IV  1 g Intravenous Q24H  . chlorthalidone  25 mg Oral Daily  . doxepin  3 mg Oral QPM  . famotidine  20 mg Oral QHS  . glipiZIDE  5 mg Oral BID AC  .  insulin aspart  0-9 Units Subcutaneous TID WC  . irbesartan  300 mg Oral Daily  . isosorbide-hydrALAZINE  1 tablet Oral TID  . metoprolol  62.5 mg Oral BID  . pneumococcal 23 valent vaccine  0.5 mL Intramuscular Tomorrow-1000  . pravastatin  80 mg Oral Daily  . sodium chloride  3 mL Intravenous Q12H  . sodium chloride  3 mL Intravenous Q12H  . terbinafine  250 mg Oral Daily    Time spent on care of this patient: 40 mins   Allie Bossier [facility]  [facility] Office  [phone] Pager - [phone]  On-Call/Text Page:      [email or web address]      password [email or web address]  If 7PM-7AM, please contact night-coverage [email or web address] Password [email or web address] 01/24/2015, 7:01 PM   LOS: 1 day   Care during the described time interval was provided by me .  I have reviewed this patient's available data, including medical history, events of  note, physical examination, radiology studies and test results as part of my evaluation  Dia Crawford, MD [phone] Pager

## 2015-01-24 NOTE — Care Management Note (Unsigned)
    Page 1 of 1   01/28/2015     1:09:24 PM CARE MANAGEMENT NOTE 01/28/2015  Patient:  Kathleen [name], Kathleen [name]   Account Number:  [id number]  Date Initiated:  01/23/2015  Documentation initiated by:  Marvetta Gibbons  Subjective/Objective Assessment:   Pt admitted with NSTEMI, CHF, new COPD, on IV hep and IV NTG     Action/Plan:   PTA pt lived at home - NCM to follow for d/c needs   Anticipated DC Date:  01/27/2015   Anticipated DC Plan:  [place]  CM consult      Choice offered to / List presented to:             Status of service:  In process, will continue to follow Medicare Important Message given?  YES (If response is "NO", the following Medicare IM given date fields will be blank) Date Medicare IM given:  01/27/2015 Medicare IM given by:  Tomi Bamberger Date Additional Medicare IM given:   Additional Medicare IM given by:    Discharge Disposition:    Per UR Regulation:  Reviewed for med. necessity/level of care/duration of stay  If discussed at [place] of Stay Meetings, dates discussed:    Comments:  01/24/15- 1045- Marvetta Gibbons RN, BSN [phone] Spoke with pt at  bedside- per conversation- pt states that she has all needed DME at home- she also reports that she has PCS with [facility] for aide 7 days/week 2-3hrs/day- she states that a [facility] RN also comes monthly to go over meds and such. NCM to follow for any additional needs

## 2015-01-25 ENCOUNTER — Inpatient Hospital Stay (HOSPITAL_COMMUNITY): Payer: Medicare Other

## 2015-01-25 DIAGNOSIS — I5033 Acute on chronic diastolic (congestive) heart failure: Secondary | ICD-10-CM | POA: Diagnosis not present

## 2015-01-25 DIAGNOSIS — I480 Paroxysmal atrial fibrillation: Secondary | ICD-10-CM | POA: Diagnosis present

## 2015-01-25 LAB — LIPID PANEL
Cholesterol: 158 mg/dL (ref 0–200)
HDL: 46 mg/dL (ref >39)
LDL Cholesterol: 97 mg/dL (ref 0–99)
TRIGLYCERIDES: 77 mg/dL (ref <150)
Total CHOL/HDL Ratio: 3.4 RATIO
VLDL: 15 mg/dL (ref 0–40)

## 2015-01-25 LAB — CBC
HEMATOCRIT: 35.8 % — AB (ref 36.0–46.0)
HEMOGLOBIN: 11.4 g/dL — AB (ref 12.0–15.0)
MCH: 28.4 pg (ref 26.0–34.0)
MCHC: 31.8 g/dL (ref 30.0–36.0)
MCV: 89.1 fL (ref 78.0–100.0)
Platelets: 176 10*3/uL (ref 150–400)
RBC: 4.02 MIL/uL (ref 3.87–5.11)
RDW: 15.9 % — ABNORMAL HIGH (ref 11.5–15.5)
WBC: 7 10*3/uL (ref 4.0–10.5)

## 2015-01-25 LAB — MAGNESIUM: MAGNESIUM: 1.9 mg/dL (ref 1.5–2.5)

## 2015-01-25 LAB — BASIC METABOLIC PANEL
ANION GAP: 10 (ref 5–15)
BUN: 42 mg/dL — ABNORMAL HIGH (ref 6–23)
CHLORIDE: 101 mmol/L (ref 96–112)
CO2: 27 mmol/L (ref 19–32)
Calcium: 8.7 mg/dL (ref 8.4–10.5)
Creatinine, Ser: 2.13 mg/dL — ABNORMAL HIGH (ref 0.50–1.10)
GFR calc Af Amer: 26 mL/min — ABNORMAL LOW (ref >90)
GFR calc non Af Amer: 22 mL/min — ABNORMAL LOW (ref >90)
GLUCOSE: 96 mg/dL (ref 70–99)
Potassium: 3.8 mmol/L (ref 3.5–5.1)
SODIUM: 138 mmol/L (ref 135–145)

## 2015-01-25 LAB — GLUCOSE, CAPILLARY
GLUCOSE-CAPILLARY: 134 mg/dL — AB (ref 70–99)
Glucose-Capillary: 80 mg/dL (ref 70–99)
Glucose-Capillary: 102 mg/dL — ABNORMAL HIGH (ref 70–99)
Glucose-Capillary: 99 mg/dL (ref 70–99)

## 2015-01-25 LAB — HEPARIN LEVEL (UNFRACTIONATED): Heparin Unfractionated: <0.1 IU/mL — ABNORMAL LOW (ref 0.30–0.70)

## 2015-01-25 MED ORDER — FUROSEMIDE 80 MG PO TABS
80.0000 mg | ORAL_TABLET | Freq: Once | ORAL | Status: AC
  Administered 2015-01-25: 80 mg via ORAL
  Filled 2015-01-25: qty 1

## 2015-01-25 MED ORDER — CETYLPYRIDINIUM CHLORIDE 0.05 % MT LIQD
7.0000 mL | Freq: Two times a day (BID) | OROMUCOSAL | Status: DC
  Administered 2015-01-25 – 2015-01-26 (×3): 7 mL via OROMUCOSAL

## 2015-01-25 MED ORDER — APIXABAN 5 MG PO TABS
5.0000 mg | ORAL_TABLET | Freq: Two times a day (BID) | ORAL | Status: DC
  Administered 2015-01-25 – 2015-01-28 (×7): 5 mg via ORAL
  Filled 2015-01-25 (×8): qty 1

## 2015-01-25 MED ORDER — METOPROLOL TARTRATE 50 MG PO TABS
50.0000 mg | ORAL_TABLET | Freq: Two times a day (BID) | ORAL | Status: DC
  Administered 2015-01-25 – 2015-01-28 (×6): 50 mg via ORAL
  Filled 2015-01-25 (×7): qty 1

## 2015-01-25 MED ORDER — IRBESARTAN 75 MG PO TABS
75.0000 mg | ORAL_TABLET | Freq: Every day | ORAL | Status: DC
  Administered 2015-01-26 – 2015-01-28 (×3): 75 mg via ORAL
  Filled 2015-01-25 (×3): qty 1

## 2015-01-25 NOTE — Progress Notes (Signed)
Subjective:  No complaints of shortness of breath.  Was noted to be in atrial fibrillation on telemetry and according to chart was in atrial fibrillation on EKG yesterday at 6 AM.  Objective:  Vital Signs in the last 24 hours: BP 104/37 mmHg  Pulse 67  Temp(Src) 97.6 F (36.4 C) (Axillary)  Resp 16  Ht 5\' 4"  (1.626 m)  Wt 123.8 kg (272 lb 14.9 oz)  BMI 46.83 kg/m2  SpO2 99%  Physical Exam: Pleasant obese black female in no acute distress Lungs:  Clear  Cardiac:  Slightly irregular rhythm, normal S1-S2 no S3, 1 to 2/6 systolic murmur Extremities:  No edema present  Intake/Output from previous day: 02/12 0701 - 02/13 0700 In: 50 [IV Piggyback:50] Out: 600 [Urine:600] Weight Filed Weights   01/23/15 0604 01/24/15 0404 01/25/15 0317  Weight: 125.5 kg (276 lb 10.8 oz) 123.3 kg (271 lb 13.2 oz) 123.8 kg (272 lb 14.9 oz)    Lab Results: Basic Metabolic Panel:  Recent Labs  01/24/15 0137 01/25/15 0334  NA 141 138  K 4.1 3.8  CL 102 101  CO2 25 27  GLUCOSE 119* 96  BUN 39* 42*  CREATININE 1.96* 2.13*    CBC:  Recent Labs  01/23/15 0101  01/24/15 0137 01/25/15 0334  WBC 12.0*  < > 9.5 7.0  NEUTROABS 9.1*  --   --   --   HGB 12.1  < > 11.7* 11.4*  HCT 37.1  < > 35.1* 35.8*  MCV 86.9  < > 86.9 89.1  PLT 182  < > 169 176  < > = values in this interval not displayed.  BNP    Component Value Date/Time   BNP 676.7* 01/23/2015 0101   Telemetry:  Atrial fibrillation with controlled ventricular response started yesterday Assessment/Plan:  1.  New onset of atrial fibrillation with controlled ventricular response that occurred since yesterday morning at 6 AM 2.  Acute on chronic diastolic heart failure resolves 3.  Stage III chronic kidney disease  Recommendations:  Her ventricular response is controlled.  She previously was on intravenous heparin but has been in atrial fibrillation since yesterday morning by EKG and by telemetry.  We'll initiate Eliquis  starting now.  Her CHADS2VASC is 5 and she would benefit from anticoagulation.  She was taken off of heparin yesterday.  Currently rate is controlled.     Kerry Hough  MD [facility]  01/25/2015, 11:41 AM

## 2015-01-25 NOTE — Progress Notes (Signed)
ANTICOAGULATION CONSULT NOTE - Initial Consult  Pharmacy Consult for Eliquis Indication: atrial fibrillation  Allergies  Allergen Reactions  . Penicillins Other (See Comments)    unknown    Patient Measurements: Height: 5\' 4"  (162.6 cm) Weight: 272 lb 14.9 oz (123.8 kg) IBW/kg (Calculated) : 54.7  Vital Signs: Temp: 97.6 F (36.4 C) (02/13 1120) Temp Source: Axillary (02/13 1120) BP: 104/37 mmHg (02/13 1120) Pulse Rate: 67 (02/13 1120)  Labs:  Recent Labs  01/23/15 0703  01/23/15 1250 01/23/15 1845 01/24/15 0137 01/24/15 1050 01/25/15 0334  HGB 11.5*  --   --   --  11.7*  --  11.4*  HCT 35.8*  --   --   --  35.1*  --  35.8*  PLT 188  --   --   --  169  --  176  HEPARINUNFRC  --   < >  --   --  0.24* 0.60 <0.10*  CREATININE 1.71*  --   --   --  1.96*  --  2.13*  TROPONINI 0.06*  --  0.07* 0.09*  --   --   --   < > = values in this interval not displayed.  Estimated Creatinine Clearance: 31.9 mL/min (by C-G formula based on Cr of 2.13).   Medical History: Past Medical History  Diagnosis Date  . Hypertension   . Coronary artery disease     Cypher stent to the RCA in 2003  . Blood transfusion     no side affects  . GERD (gastroesophageal reflux disease)   . Arthritis   . Diabetes mellitus     Borderline  . CHF (congestive heart failure)   . Shortness of breath     Medications:  Prescriptions prior to admission  Medication Sig Dispense Refill Last Dose  . albuterol (PROVENTIL HFA;VENTOLIN HFA) 108 (90 BASE) MCG/ACT inhaler Inhale 1-2 puffs into the lungs every 6 (six) hours as needed for wheezing or shortness of breath. 1 Inhaler 0 01/22/2015 at Unknown time  . allopurinol (ZYLOPRIM) 100 MG tablet Take 100 mg by mouth daily.   01/22/2015 at Unknown time  . aspirin EC 81 MG tablet Take 81 mg by mouth daily.     01/22/2015 at Unknown time  . chlorthalidone (HYGROTON) 25 MG tablet Take 25 mg by mouth daily.   01/22/2015 at Unknown time  . colchicine 0.6 MG  tablet Take 0.6 mg by mouth daily as needed (for gout flare ups).    01/22/2015 at Unknown time  . Doxepin HCl 3 MG TABS Take 1 tablet by mouth every evening.   01/22/2015 at Unknown time  . famotidine (PEPCID) 20 MG tablet Take 20 mg by mouth at bedtime.    01/22/2015 at Unknown time  . furosemide (LASIX) 40 MG tablet Take 1.5 tablets (60 mg total) by mouth daily. 90 tablet 1 01/22/2015 at Unknown time  . glipiZIDE (GLUCOTROL) 5 MG tablet Take 1 tablet (5 mg total) by mouth 2 (two) times daily before a meal. 60 tablet 1 01/22/2015 at Unknown time  . isosorbide-hydrALAZINE (BIDIL) 20-37.5 MG per tablet Take 1 tablet by mouth 3 (three) times daily. 90 tablet 1 01/22/2015 at Unknown time  . metoprolol (LOPRESSOR) 50 MG tablet Take 1 tablet by mouth 2 (two) times daily.  0 01/22/2015 at 2200  . pravastatin (PRAVACHOL) 80 MG tablet Take 80 mg by mouth daily.   01/22/2015 at Unknown time  . telmisartan (MICARDIS) 80 MG tablet Take 80 mg by mouth  daily.   01/22/2015 at Unknown time  . terbinafine (LAMISIL) 250 MG tablet Take 250 mg by mouth daily.  0 01/22/2015 at Unknown time  . metoprolol (LOPRESSOR) 100 MG tablet Take 1 tablet (100 mg total) by mouth 2 (two) times daily. (Patient not taking: Reported on 01/23/2015) 60 tablet 1 Not Taking at Unknown time  . pravastatin (PRAVACHOL) 80 MG tablet Take 1 tablet (80 mg total) by mouth daily. 30 tablet 6 09/16/2014 at Unknown time  . telmisartan (MICARDIS) 40 MG tablet Take 1 tablet (40 mg total) by mouth daily. (Patient not taking: Reported on 01/23/2015) 30 tablet 1 Not Taking at Unknown time    Assessment: 69 yo F presents on 2/11 with CP and SOB. Was noted to be in Afib yesterday and today. No hx of known Afib. CHADS2VASC = 5. Pt was on heparin gtt but stopped on 2/12. Pharmacy to dose Eliquis for Afib. Wt is 123 kg, SCr 2.13. Hgb is 11.4, plts ok. No s/s of bleed.  Goal of Therapy:  Monitor platelets by anticoagulation protocol: Yes   Plan:  Start Eliquis 5mg  PO  BID Stop heparin SQ Monitor renal function, CBC, s/s of bleed  [name] 01/25/2015,11:51 AM

## 2015-01-25 NOTE — Progress Notes (Addendum)
[place] TEAM 1 - Stepdown/ICU TEAM Progress Note  Kathleen [name] [id number] DOB: 02-Jun-1946 DOA: 01/23/2015 PCP: Philis Fendt, MD  Admit HPI / Brief Narrative: Kathleen [name] is a 69 y.o. BF PMHx CAD status post stenting, CHF last EF measured in October 2015 was 60-65%, hypertension, diabetes mellitus type 2, chronic kidney disease Presents to the ER because of worsening shortness of breath and chest pain. Patient states her symptoms started 3-4 days ago with shortness of breath wheezing and productive cough. Denies any fever or chills. Over the last 2 days patient also has been having progressively worsening chest pain which is retrosternal nonradiating pressure-like. In the ER patient was found to have lower extremity edema and on exam has bilateral expiratory wheeze. Chest x-ray shows congestion and interstitial edema. Troponin is mildly positive. Patient has been admitted for further management of her shortness of breath which at this time probably a combination of CHF and COPD with non-ST elevation MI. Patient denies any nausea vomiting abdominal pain diarrhea.   HPI/Subjective: 2/12 A/O 4, states chest pain has resolved, positive continued SOB. States negative home O2 requirement  Assessment/Plan: Shortness of breath/ COPD exacerbation/CAP - mostly likely combination of CHF, smoking and COPD -Lasix 80 mg 1; PCXR shows fluid overload .  -Prior to discharge will need to obtain new baseline weight -Azithromycin + ceftriaxone; complete 7 day course   Non-ST elevation MI  - S/P Two part Lexiscan   Chronic diastolic CHF -Increasing fluid overload see PCXR 2/11 -Aspirin 325 mg daily -Lasix 80 mg x1  (Hm dose Lasix 60 mg BID)  -Continue  Bidil 20-30 7.5 TID, DC Hydralazine 25 mg TID -Decrease Irbesartan 75 mg Daily secondary to increasing Cr -Decrease metoprolol 50 mg BID -Strict in and out since admission; -1.39ml -A.m. Weight; admission weight= 125.5 kg                     2/13 weight= 123.8 kg  Paroxysmal atrial fibrillation -Patient currently rate controlled and in NSR -Cardiology started Eliquis 5 mg BID  Diabetes mellitus type 2 controlled  -2/11 hemoglobin A1c= 6.5 - Continue glipizide 5 mg BID -Sensitive SSI  Hypertension  - See chronic diastolic CHF   Chronic kidney disease stage III  - creatinine increasing today most likely secondary to fluid overload   Hyperlipidemia  -Continue Pravachol 80 mg daily  -Lipid panel within ADA guidelines    Code Status: FULL Family Communication: no family present at time of exam Disposition Plan: Per cardiology   Consultants: Skip Mayer (cardiology)  Procedure/Significant Events: 2/11 echocardiogram;- LVEF= 55% to 60%. - (grade 2 diastolic dysfunction). 2/11 two part Lexiscan No reversible ischemia or infarction. -LVEF=54%. - Low-risk stress test findings*.  Culture   Antibiotics: Azithromycin 2/11>> Ceftriaxone 2/11>>   DVT prophylaxis: Subcutaneous heparin   Devices    LINES / TUBES:      Continuous Infusions:    Objective: VITAL SIGNS: Temp: 97.6 F (36.4 C) (02/13 1120) Temp Source: Axillary (02/13 1120) BP: 104/37 mmHg (02/13 1120) Pulse Rate: 67 (02/13 1120) SPO2; FIO2:   Intake/Output Summary (Last 24 hours) at 01/25/15 1637 Last data filed at 01/25/15 1300  Gross per 24 hour  Intake     50 ml  Output    575 ml  Net   -525 ml     Exam: General: A/O 4, NAD, No acute respiratory distress Lungs: Clear to auscultation bilaterally, except for decreased breath sounds bibasilar, without wheezes or crackles  Cardiovascular: Irregular irregular rhythm and rate without murmur gallop or rub normal S1 and S2 Abdomen: Nontender, nondistended, soft, bowel sounds positive, no rebound, no ascites, no appreciable mass Extremities: No significant cyanosis, clubbing, or edema bilateral lower extremities  Data Reviewed: Basic Metabolic Panel:  Recent Labs Lab  01/23/15 0103 01/23/15 0703 01/24/15 0137 01/25/15 0334  NA 139 141 141 138  K 3.9 3.7 4.1 3.8  CL 103 104 102 101  CO2 28 24 25 27   GLUCOSE 122* 102* 119* 96  BUN 33* 32* 39* 42*  CREATININE 1.85* 1.71* 1.96* 2.13*  CALCIUM 9.5 9.2 9.2 8.7  MG  --   --   --  1.9   Liver Function Tests:  Recent Labs Lab 01/24/15 0137  AST 27  ALT 19  ALKPHOS 76  BILITOT 1.1  PROT 7.0  ALBUMIN 3.0*   No results for input(s): LIPASE, AMYLASE in the last 168 hours. No results for input(s): AMMONIA in the last 168 hours. CBC:  Recent Labs Lab 01/23/15 0101 01/23/15 0703 01/24/15 0137 01/25/15 0334  WBC 12.0* 12.1* 9.5 7.0  NEUTROABS 9.1*  --   --   --   HGB 12.1 11.5* 11.7* 11.4*  HCT 37.1 35.8* 35.1* 35.8*  MCV 86.9 88.0 86.9 89.1  PLT 182 188 169 176   Cardiac Enzymes:  Recent Labs Lab 01/23/15 0101 01/23/15 0703 01/23/15 1250 01/23/15 1845  TROPONINI 0.04* 0.06* 0.07* 0.09*   BNP (last 3 results)  Recent Labs  01/23/15 0101  BNP 676.7*    ProBNP (last 3 results)  Recent Labs  04/07/14 1609 09/17/14 0544  PROBNP 1679.0* 4469.0*    CBG:  Recent Labs Lab 01/24/15 1229 01/24/15 1549 01/24/15 2122 01/25/15 0729 01/25/15 1339  GLUCAP 76 127* 96 102* 99    Recent Results (from the past 240 hour(s))  MRSA PCR Screening     Status: None   Collection Time: 01/23/15  7:44 AM  Result Value Ref Range Status   MRSA by PCR NEGATIVE NEGATIVE Final    Comment:        The GeneXpert MRSA Assay (FDA approved for NASAL specimens only), is one component of a comprehensive MRSA colonization surveillance program. It is not intended to diagnose MRSA infection nor to guide or monitor treatment for MRSA infections.      Studies:  Recent x-ray studies have been reviewed in detail by the Attending Physician  Scheduled Meds:  Scheduled Meds: . allopurinol  100 mg Oral Daily  . antiseptic oral rinse  7 mL Mouth Rinse BID  . apixaban  5 mg Oral BID  .  aspirin EC  325 mg Oral Daily  . azithromycin  500 mg Oral Daily  . budesonide (PULMICORT) nebulizer solution  0.25 mg Nebulization BID  . cefTRIAXone (ROCEPHIN)  IV  1 g Intravenous Q24H  . doxepin  3 mg Oral QPM  . famotidine  20 mg Oral QHS  . glipiZIDE  5 mg Oral BID AC  . insulin aspart  0-9 Units Subcutaneous TID WC  . irbesartan  150 mg Oral Daily  . isosorbide-hydrALAZINE  1 tablet Oral TID  . metoprolol  75 mg Oral BID  . pravastatin  80 mg Oral Daily  . sodium chloride  3 mL Intravenous Q12H  . sodium chloride  3 mL Intravenous Q12H  . terbinafine  250 mg Oral Daily    Time spent on care of this patient: 40 mins   Sharonlee Nine, J , PAC  [facility] Office  [phone] Pager - [phone]  On-Call/Text Page:      [email or web address]      password [email or web address]  If 7PM-7AM, please contact night-coverage [email or web address] Password [facility] 01/25/2015, 4:37 PM   LOS: 2 days   Care during the described time interval was provided by me .  I have reviewed this patient's available data, including medical history, events of note, physical examination, radiology studies and test results as part of my evaluation  Dia Crawford, MD [phone] Pager

## 2015-01-26 DIAGNOSIS — I480 Paroxysmal atrial fibrillation: Secondary | ICD-10-CM

## 2015-01-26 LAB — GLUCOSE, CAPILLARY
GLUCOSE-CAPILLARY: 99 mg/dL (ref 70–99)
GLUCOSE-CAPILLARY: 156 mg/dL — AB (ref 70–99)
Glucose-Capillary: 53 mg/dL — ABNORMAL LOW (ref 70–99)
Glucose-Capillary: 74 mg/dL (ref 70–99)
Glucose-Capillary: 118 mg/dL — ABNORMAL HIGH (ref 70–99)

## 2015-01-26 LAB — CBC
HCT: 36.6 % (ref 36.0–46.0)
Hemoglobin: 11.8 g/dL — ABNORMAL LOW (ref 12.0–15.0)
MCH: 28 pg (ref 26.0–34.0)
MCHC: 32.2 g/dL (ref 30.0–36.0)
MCV: 86.9 fL (ref 78.0–100.0)
PLATELETS: 195 10*3/uL (ref 150–400)
RBC: 4.21 MIL/uL (ref 3.87–5.11)
RDW: 15.8 % — ABNORMAL HIGH (ref 11.5–15.5)
WBC: 6.8 10*3/uL (ref 4.0–10.5)

## 2015-01-26 LAB — BASIC METABOLIC PANEL
ANION GAP: 7 (ref 5–15)
BUN: 45 mg/dL — AB (ref 6–23)
CHLORIDE: 99 mmol/L (ref 96–112)
CO2: 34 mmol/L — ABNORMAL HIGH (ref 19–32)
Calcium: 9.1 mg/dL (ref 8.4–10.5)
Creatinine, Ser: 2.54 mg/dL — ABNORMAL HIGH (ref 0.50–1.10)
GFR calc non Af Amer: 18 mL/min — ABNORMAL LOW (ref >90)
GFR, EST AFRICAN AMERICAN: 21 mL/min — AB (ref >90)
Glucose, Bld: 128 mg/dL — ABNORMAL HIGH (ref 70–99)
Potassium: 4 mmol/L (ref 3.5–5.1)
Sodium: 140 mmol/L (ref 135–145)

## 2015-01-26 LAB — MAGNESIUM: MAGNESIUM: 2 mg/dL (ref 1.5–2.5)

## 2015-01-26 MED ORDER — CEFUROXIME AXETIL 250 MG PO TABS
250.0000 mg | ORAL_TABLET | Freq: Two times a day (BID) | ORAL | Status: DC
Start: 2015-01-26 — End: 2015-01-27
  Administered 2015-01-26 – 2015-01-27 (×2): 250 mg via ORAL
  Filled 2015-01-26 (×4): qty 1

## 2015-01-26 MED ORDER — GLIPIZIDE 2.5 MG HALF TABLET
2.5000 mg | ORAL_TABLET | Freq: Two times a day (BID) | ORAL | Status: DC
  Administered 2015-01-27 – 2015-01-28 (×3): 2.5 mg via ORAL
  Filled 2015-01-26 (×6): qty 1

## 2015-01-26 NOTE — Progress Notes (Signed)
[place] TEAM 1 - Stepdown/ICU TEAM Progress Note  ZAMYAH LELLO [id number] DOB: [birth year] DOA: 01/23/2015 PCP: Philis Fendt, MD  Admit HPI / Brief Narrative: Kathleen [name] is a 69 y.o. BF PMHx CAD status post stenting, CHF last EF measured in October 2015 was 60-65%, hypertension, diabetes mellitus type 2, chronic kidney disease Presents to the ER because of worsening shortness of breath and chest pain. Patient states her symptoms started 3-4 days ago with shortness of breath wheezing and productive cough. Denies any fever or chills. Over the last 2 days patient also has been having progressively worsening chest pain which is retrosternal nonradiating pressure-like. In the ER patient was found to have lower extremity edema and on exam has bilateral expiratory wheeze. Chest x-ray shows congestion and interstitial edema. Troponin is mildly positive. Patient has been admitted for further management of her shortness of breath which at this time probably a combination of CHF and COPD with non-ST elevation MI. Patient denies any nausea vomiting abdominal pain diarrhea.   HPI/Subjective: 2/14 A/O 4, states chest pain has resolved, SOB resolved.   Assessment/Plan: Shortness of breath/ COPD exacerbation/CAP - mostly likely combination of CHF, smoking and COPD -Lasix 80 mg 1; PCXR shows fluid overload . -Counseled patient that we will hold any further diuresis secondary to Cr increasing.  -Will consider 2/15 a.m. weight as new baseline weight -Azithromycin + Ceftin ; complete 7 day course   Non-ST elevation MI  - S/P Two part Lexiscan   Chronic diastolic CHF -Increasing fluid overload see PCXR 2/11 -Aspirin 325 mg daily -Hold any further diuretics; increasing creatinine Lasix 80 mg x1  (Hm dose Lasix 60 mg BID)  -Continue  Bidil 20-30 7.5 TID, DC Hydralazine 25 mg TID -Continue Irbesartan 75 mg Daily secondary to increasing Cr -Continue metoprolol 50 mg BID -Strict in and out  since admission; -2.40ml -A.m. Weight; admission weight= 125.5 kg                    2/13 weight= 124.5 kg  Paroxysmal atrial fibrillation -Patient currently rate controlled and in NSR -Continue Eliquis 5 mg BID  Diabetes mellitus type 2 controlled  -2/11 hemoglobin A1c= 6.5 -Several episodes of hypoglycemia decrease glipizide to 2.5 mg BID while in the hospital, would discharge on the 5 mg BID -Sensitive SSI  Hypertension  - See chronic diastolic CHF   Chronic kidney disease stage III  - creatinine increasing today most likely secondary to overdiuresis -Counseled patient that her previous dry weight is now invalid. Patient at her dry weight.  Hyperlipidemia  -Continue Pravachol 80 mg daily  -Lipid panel within ADA guidelines    Code Status: FULL Family Communication: no family present at time of exam Disposition Plan: Per cardiology   Consultants: Skip Mayer (cardiology)  Procedure/Significant Events: 2/11 echocardiogram;- LVEF= 55% to 60%. - (grade 2 diastolic dysfunction). 2/11 two part Lexiscan No reversible ischemia or infarction. -LVEF=54%. - Low-risk stress test findings*.  Culture   Antibiotics: Azithromycin 2/11>> Ceftriaxone 2/11>> stopped 2/14 Ceftin 2/14>>   DVT prophylaxis: Subcutaneous heparin   Devices    LINES / TUBES:      Continuous Infusions:    Objective: VITAL SIGNS: Temp: 97.6 F (36.4 C) (02/14 1441) Temp Source: Oral (02/14 1441) BP: 128/75 mmHg (02/14 0910) Pulse Rate: 99 (02/14 0910) SPO2; FIO2:   Intake/Output Summary (Last 24 hours) at 01/26/15 1711 Last data filed at 01/26/15 0747  Gross per 24 hour  Intake  120 ml  Output    900 ml  Net   -780 ml     Exam: General: A/O 4, NAD, No acute respiratory distress Lungs: Clear to auscultation bilaterally, except for decreased breath sounds bibasilar, without wheezes or crackles Cardiovascular: Irregular irregular rhythm and rate without murmur gallop  or rub normal S1 and S2 Abdomen: Nontender, nondistended, soft, bowel sounds positive, no rebound, no ascites, no appreciable mass Extremities: No significant cyanosis, clubbing, or edema bilateral lower extremities  Data Reviewed: Basic Metabolic Panel:  Recent Labs Lab 01/23/15 0103 01/23/15 0703 01/24/15 0137 01/25/15 0334 01/26/15 0255  NA 139 141 141 138 140  K 3.9 3.7 4.1 3.8 4.0  CL 103 104 102 101 99  CO2 28 24 25 27  34*  GLUCOSE 122* 102* 119* 96 128*  BUN 33* 32* 39* 42* 45*  CREATININE 1.85* 1.71* 1.96* 2.13* 2.54*  CALCIUM 9.5 9.2 9.2 8.7 9.1  MG  --   --   --  1.9 2.0   Liver Function Tests:  Recent Labs Lab 01/24/15 0137  AST 27  ALT 19  ALKPHOS 76  BILITOT 1.1  PROT 7.0  ALBUMIN 3.0*   No results for input(s): LIPASE, AMYLASE in the last 168 hours. No results for input(s): AMMONIA in the last 168 hours. CBC:  Recent Labs Lab 01/23/15 0101 01/23/15 0703 01/24/15 0137 01/25/15 0334 01/26/15 0255  WBC 12.0* 12.1* 9.5 7.0 6.8  NEUTROABS 9.1*  --   --   --   --   HGB 12.1 11.5* 11.7* 11.4* 11.8*  HCT 37.1 35.8* 35.1* 35.8* 36.6  MCV 86.9 88.0 86.9 89.1 86.9  PLT 182 188 169 176 195   Cardiac Enzymes:  Recent Labs Lab 01/23/15 0101 01/23/15 0703 01/23/15 1250 01/23/15 1845  TROPONINI 0.04* 0.06* 0.07* 0.09*   BNP (last 3 results)  Recent Labs  01/23/15 0101  BNP 676.7*    ProBNP (last 3 results)  Recent Labs  04/07/14 1609 09/17/14 0544  PROBNP 1679.0* 4469.0*    CBG:  Recent Labs Lab 01/25/15 0542 01/25/15 0729 01/25/15 1339 01/25/15 2150 01/26/15 0722  GLUCAP 80 102* 99 134* 99    Recent Results (from the past 240 hour(s))  MRSA PCR Screening     Status: None   Collection Time: 01/23/15  7:44 AM  Result Value Ref Range Status   MRSA by PCR NEGATIVE NEGATIVE Final    Comment:        The GeneXpert MRSA Assay (FDA approved for NASAL specimens only), is one component of a comprehensive MRSA  colonization surveillance program. It is not intended to diagnose MRSA infection nor to guide or monitor treatment for MRSA infections.      Studies:  Recent x-ray studies have been reviewed in detail by the Attending Physician  Scheduled Meds:  Scheduled Meds: . allopurinol  100 mg Oral Daily  . antiseptic oral rinse  7 mL Mouth Rinse BID  . apixaban  5 mg Oral BID  . aspirin EC  325 mg Oral Daily  . azithromycin  500 mg Oral Daily  . budesonide (PULMICORT) nebulizer solution  0.25 mg Nebulization BID  . cefTRIAXone (ROCEPHIN)  IV  1 g Intravenous Q24H  . doxepin  3 mg Oral QPM  . famotidine  20 mg Oral QHS  . glipiZIDE  5 mg Oral BID AC  . insulin aspart  0-9 Units Subcutaneous TID WC  . irbesartan  75 mg Oral Daily  . isosorbide-hydrALAZINE  1 tablet  Oral TID  . metoprolol  50 mg Oral BID  . pravastatin  80 mg Oral Daily  . sodium chloride  3 mL Intravenous Q12H  . sodium chloride  3 mL Intravenous Q12H  . terbinafine  250 mg Oral Daily    Time spent on care of this patient: 40 mins   Allie Bossier [facility]  [facility] Office  [phone] Pager - [phone]  On-Call/Text Page:      [email or web address]      password [email or web address]  If 7PM-7AM, please contact night-coverage [email or web address] Password [email or web address] 01/26/2015, 5:11 PM   LOS: 3 days   Care during the described time interval was provided by me .  I have reviewed this patient's available data, including medical history, events of note, physical examination, radiology studies and test results as part of my evaluation  Dia Crawford, MD [phone] Pager

## 2015-01-26 NOTE — Progress Notes (Signed)
Ambulated pt 300 ft on RA. O2 sats went as low as 93% but stayed 99-100% most of the time.

## 2015-01-26 NOTE — Progress Notes (Signed)
Report called to Scott RN.

## 2015-01-26 NOTE — Progress Notes (Signed)
Subjective:  No complaints of shortness of breath.  Having paroxysmal atrial fibrillation.  Alternates with sinus, but currently in atrial fibrillation.  Objective:  Vital Signs in the last 24 hours: BP 128/75 mmHg  Pulse 99  Temp(Src) 98.1 F (36.7 C) (Oral)  Resp 23  Ht 5\' 4"  (1.626 m)  Wt 124.5 kg (274 lb 7.6 oz)  BMI 47.09 kg/m2  SpO2 90%  Physical Exam: Pleasant obese black female in no acute distress Lungs:  Clear  Cardiac:  Slightly irregular rhythm, normal S1-S2 no S3, 1 to 2/6 systolic murmur Extremities:  No edema present  Intake/Output from previous day: 02/13 0701 - 02/14 0700 In: 120 [P.O.:120] Out: 775 [Urine:775] Weight Filed Weights   01/24/15 0404 01/25/15 0317 01/26/15 0339  Weight: 123.3 kg (271 lb 13.2 oz) 123.8 kg (272 lb 14.9 oz) 124.5 kg (274 lb 7.6 oz)    Lab Results: Basic Metabolic Panel:  Recent Labs  01/25/15 0334 01/26/15 0255  NA 138 140  K 3.8 4.0  CL 101 99  CO2 27 34*  GLUCOSE 96 128*  BUN 42* 45*  CREATININE 2.13* 2.54*    CBC:  Recent Labs  01/25/15 0334 01/26/15 0255  WBC 7.0 6.8  HGB 11.4* 11.8*  HCT 35.8* 36.6  MCV 89.1 86.9  PLT 176 195    BNP    Component Value Date/Time   BNP 676.7* 01/23/2015 0101   Telemetry:  Atrial fibrillation with controlled ventricular response started yesterday Assessment/Plan:  1.  New onset of atrial fibrillation with controlled ventricular response that occurred since yesterday morning at 6 AM 2.  Acute on chronic diastolic heart failure resolved 3.  Stage III chronic kidney disease worse today  Recommendations:  Her ventricular response is controlled today.  She may be overdiuresed with creatinine going up.  Will need to watch that.   Kerry Hough  MD [facility]  01/26/2015, 12:31 PM

## 2015-01-26 NOTE — Discharge Instructions (Signed)

## 2015-01-26 NOTE — Progress Notes (Signed)
     Subjective:   Kathleen [name] is a 42 year year-old African-American female who is known CAD  and is status post stenting of her RCA in 2003. She has a history of hypertension, type 2 diabetes mellitus, chronic kidney disease and shortness of breath. She has seen Dr. Percival Spanish in the office and last saw him in 2013.   No complaints of shortness of breath.  Was noted to be in atrial fibrillation on telemetry and according to chart was in atrial fibrillation on EKG yesterday at 6 AM.  Objective:  Vital Signs in the last 24 hours: BP 128/75 mmHg  Pulse 99  Temp(Src) 98.1 F (36.7 C) (Oral)  Resp 23  Ht 5\' 4"  (1.626 m)  Wt 274 lb 7.6 oz (124.5 kg)  BMI 47.09 kg/m2  SpO2 90%  Physical Exam: Pleasant obese black female in no acute distress Lungs:  Clear  Cardiac:  Slightly irregular rhythm, normal S1-S2 no S3, 1 to 2/6 systolic murmur Extremities:  No edema present She was up walking in the hall when I saw her.  No CP or dyspnea.   Intake/Output from previous day: 02/13 0701 - 02/14 0700 In: 120 [P.O.:120] Out: 775 [Urine:775] Weight Filed Weights   01/24/15 0404 01/25/15 0317 01/26/15 0339  Weight: 271 lb 13.2 oz (123.3 kg) 272 lb 14.9 oz (123.8 kg) 274 lb 7.6 oz (124.5 kg)    Lab Results: Basic Metabolic Panel:  Recent Labs  01/25/15 0334 01/26/15 0255  NA 138 140  K 3.8 4.0  CL 101 99  CO2 27 34*  GLUCOSE 96 128*  BUN 42* 45*  CREATININE 2.13* 2.54*    CBC:  Recent Labs  01/25/15 0334 01/26/15 0255  WBC 7.0 6.8  HGB 11.4* 11.8*  HCT 35.8* 36.6  MCV 89.1 86.9  PLT 176 195    BNP    Component Value Date/Time   BNP 676.7* 01/23/2015 0101   Telemetry:  Atrial fibrillation with controlled ventricular response started yesterday Assessment/Plan:  1.  New onset of atrial fibrillation with controlled ventricular response - has been started on Eliquis  Her CHADS2VASC is 5. 2.  Acute on chronic diastolic heart failure    3.  Stage III  chronic kidney disease  4. CAD - pain free.      Thayer Headings, Brooke Bonito., MD, [facility] 01/26/2015, 12:32 PM [address],  [place] Pager [phone]

## 2015-01-27 DIAGNOSIS — J189 Pneumonia, unspecified organism: Secondary | ICD-10-CM

## 2015-01-27 DIAGNOSIS — R7989 Other specified abnormal findings of blood chemistry: Secondary | ICD-10-CM

## 2015-01-27 LAB — MAGNESIUM: Magnesium: 2.1 mg/dL (ref 1.5–2.5)

## 2015-01-27 LAB — BASIC METABOLIC PANEL
Anion gap: 10 (ref 5–15)
BUN: 49 mg/dL — AB (ref 6–23)
CHLORIDE: 99 mmol/L (ref 96–112)
CO2: 30 mmol/L (ref 19–32)
CREATININE: 2.28 mg/dL — AB (ref 0.50–1.10)
Calcium: 8.6 mg/dL (ref 8.4–10.5)
GFR calc non Af Amer: 21 mL/min — ABNORMAL LOW (ref >90)
GFR, EST AFRICAN AMERICAN: 24 mL/min — AB (ref >90)
GLUCOSE: 122 mg/dL — AB (ref 70–99)
Potassium: 4.1 mmol/L (ref 3.5–5.1)
Sodium: 139 mmol/L (ref 135–145)

## 2015-01-27 LAB — GLUCOSE, CAPILLARY
GLUCOSE-CAPILLARY: 104 mg/dL — AB (ref 70–99)
GLUCOSE-CAPILLARY: 116 mg/dL — AB (ref 70–99)
Glucose-Capillary: 116 mg/dL — ABNORMAL HIGH (ref 70–99)
Glucose-Capillary: 110 mg/dL — ABNORMAL HIGH (ref 70–99)

## 2015-01-27 MED ORDER — LEVALBUTEROL HCL 0.63 MG/3ML IN NEBU
0.6300 mg | INHALATION_SOLUTION | Freq: Four times a day (QID) | RESPIRATORY_TRACT | Status: DC
  Administered 2015-01-27 – 2015-01-28 (×3): 0.63 mg via RESPIRATORY_TRACT
  Filled 2015-01-27 (×7): qty 3

## 2015-01-27 MED ORDER — IPRATROPIUM BROMIDE 0.02 % IN SOLN
0.5000 mg | Freq: Four times a day (QID) | RESPIRATORY_TRACT | Status: DC
  Administered 2015-01-27 – 2015-01-28 (×3): 0.5 mg via RESPIRATORY_TRACT
  Filled 2015-01-27 (×3): qty 2.5

## 2015-01-27 MED ORDER — LEVALBUTEROL HCL 0.63 MG/3ML IN NEBU: 0.6300 mg | INHALATION_SOLUTION | RESPIRATORY_TRACT | Status: DC | PRN

## 2015-01-27 MED ORDER — FUROSEMIDE 40 MG PO TABS
60.0000 mg | ORAL_TABLET | Freq: Once | ORAL | Status: AC
  Administered 2015-01-27: 60 mg via ORAL
  Filled 2015-01-27: qty 1

## 2015-01-27 NOTE — Progress Notes (Signed)
[place] TEAM 1 - Stepdown/ICU TEAM Progress Note  Kathleen [name] [id number] DOB: [birth year] DOA: 01/23/2015 PCP: Philis Fendt, MD  Admit HPI / Brief Narrative: 69 y.o. F Hx CAD status post stenting, CHF last EF measured in October 2015 60-65%, hypertension, diabetes mellitus type 2, and chronic kidney disease who presented to the ER because of worsening shortness of breath and chest pain. Patient stated her symptoms started 3-4 days prior with shortness of breath wheezing and productive cough. Denies fever or chills. Over 2 days the patient also noted progressively worsening chest pain which was retrosternal nonradiating pressure-like.   In the ER patient was found to have lower extremity edema and expiratory wheeze. Chest x-ray noted congestion and interstitial edema. Troponin was mildly positive.   HPI/Subjective: Pt states she still feels sob, and that this worsens when she attempts to ambulate.  She denies chest pressure, ha, n/v, or abdom pain.    Assessment/Plan:  Multifactorial Dyspnea -mostly likely combination of CHF, smoking, and COPD -CXR showed fluid overload  -see individual issues noted below  -given lack of focal infiltrate on CXR, lack of elevated WBC, and no fever doubt pt is suffering w/ PNA - will stop abx today, having completed 5 days of tx  Acute exacerbation of Chronic diastolic CHF -Aspirin [id number] mg daily -diuretic had been on hold due to climbing creatinine, but pt dyspneic today - attempt to ambulate again today and follow - dose w/ oral diuretic only today  -Continue  Bidil 20-30 7.5 TID -Continue Irbesartan but if crt climbs again will have to consider stopping it  -Continue metoprolol 50 mg BID -since admission net negative ~2.25L -admission weight= 125.5 kg > 2/15 weight= 125 kg  Acute COPD exac -no wheezing on exam today -has completed 5 days of abx tx  -care w/ nebulizers given new afib   Paroxysmal atrial fibrillation - newly diagnosed    -Patient currently rate controlled and NSR by asculatation  -CHADS2VASC is 5 - continue Eliquis 5 mg BID  Diabetes mellitus type 2 controlled -2/11 A1c 6.5 -Several episodes of hypoglycemia > decreased glipizide to 2.5 mg BID while in the hospital -Sensitive SSI  Hypertension  -See chronic diastolic CHF   Chronic kidney disease stage III  -creatinine has improved slightly w/ holding of diuretic - follow w/ oral dose of lasix today - will be tricky to keep her at her dry weight w/o worsening renal fxn  Hyperlipidemia  -Continue Pravachol 80 mg daily  -Lipid panel within ADA guidelines  Code Status: FULL Family Communication: no family present at time of exam Disposition Plan: monitor renal fxn and sx another 24hrs  Consultants: [facility]   Procedure/Significant Events: 2/11 echocardiogram;- LVEF= 55% to 60%. - (grade 2 diastolic dysfunction). 2/11 two part Lexiscan No reversible ischemia or infarction. -LVEF=54%. - Low-risk stress test findings*.  Antibiotics: Azithromycin 2/11 > Ceftriaxone 2/11 > 2/14 Ceftin 2/14 > 2/15  DVT prophylaxis: eliquis  Objective: Blood pressure 128/74, pulse 60, temperature 98 F (36.7 C), temperature source Oral, resp. rate 16, height 5\' 4"  (1.626 m), weight 125 kg (275 lb 9.2 oz), SpO2 98 %.  Intake/Output Summary (Last 24 hours) at 01/27/15 1315 Last data filed at 01/27/15 0953  Gross per 24 hour  Intake    459 ml  Output    400 ml  Net     59 ml   Exam: General: No acute respiratory distress evident laying in bed  Lungs: poor air movement B bases -  no wheeze  Cardiovascular: RRR w/o M, G, R Abdomen: Nontender, nondistended, soft, bowel sounds positive, no rebound, no ascites, no appreciable mass Extremities: No significant cyanosis, clubbing, or edema bilateral lower extremities  Data Reviewed: Basic Metabolic Panel:  Recent Labs Lab 01/23/15 0703 01/24/15 0137 01/25/15 0334 01/26/15 0255 01/27/15 0752  NA 141  141 138 140 139  K 3.7 4.1 3.8 4.0 4.1  CL 104 102 101 99 99  CO2 24 25 27  34* 30  GLUCOSE 102* 119* 96 128* 122*  BUN 32* 39* 42* 45* 49*  CREATININE 1.71* 1.96* 2.13* 2.54* 2.28*  CALCIUM 9.2 9.2 8.7 9.1 8.6  MG  --   --  1.9 2.0 2.1   Liver Function Tests:  Recent Labs Lab 01/24/15 0137  AST 27  ALT 19  ALKPHOS 76  BILITOT 1.1  PROT 7.0  ALBUMIN 3.0*   CBC:  Recent Labs Lab 01/23/15 0101 01/23/15 0703 01/24/15 0137 01/25/15 0334 01/26/15 0255  WBC 12.0* 12.1* 9.5 7.0 6.8  NEUTROABS 9.1*  --   --   --   --   HGB 12.1 11.5* 11.7* 11.4* 11.8*  HCT 37.1 35.8* 35.1* 35.8* 36.6  MCV 86.9 88.0 86.9 89.1 86.9  PLT 182 188 169 176 195   Cardiac Enzymes:  Recent Labs Lab 01/23/15 0101 01/23/15 0703 01/23/15 1250 01/23/15 1845  TROPONINI 0.04* 0.06* 0.07* 0.09*   BNP (last 3 results)  Recent Labs  01/23/15 0101  BNP 676.7*    CBG:  Recent Labs Lab 01/26/15 1554 01/26/15 1619 01/26/15 2111 01/27/15 0833 01/27/15 1230  GLUCAP 53* 74 118* 104* 116*    Recent Results (from the past 240 hour(s))  MRSA PCR Screening     Status: None   Collection Time: 01/23/15  7:44 AM  Result Value Ref Range Status   MRSA by PCR NEGATIVE NEGATIVE Final    Comment:        The GeneXpert MRSA Assay (FDA approved for NASAL specimens only), is one component of a comprehensive MRSA colonization surveillance program. It is not intended to diagnose MRSA infection nor to guide or monitor treatment for MRSA infections.      Studies:  Recent x-ray studies have been reviewed in detail by the Attending Physician  Scheduled Meds:  Scheduled Meds: . allopurinol  100 mg Oral Daily  . antiseptic oral rinse  7 mL Mouth Rinse BID  . apixaban  5 mg Oral BID  . aspirin EC  325 mg Oral Daily  . azithromycin  500 mg Oral Daily  . budesonide (PULMICORT) nebulizer solution  0.25 mg Nebulization BID  . cefUROXime  250 mg Oral BID WC  . doxepin  3 mg Oral QPM  . famotidine   20 mg Oral QHS  . glipiZIDE  2.5 mg Oral BID AC  . insulin aspart  0-9 Units Subcutaneous TID WC  . irbesartan  75 mg Oral Daily  . isosorbide-hydrALAZINE  1 tablet Oral TID  . metoprolol  50 mg Oral BID  . pravastatin  80 mg Oral Daily  . sodium chloride  3 mL Intravenous Q12H  . sodium chloride  3 mL Intravenous Q12H  . terbinafine  250 mg Oral Daily    Time spent on care of this patient: 35 mins  Cherene Altes, MD [facility] For Consults/Admissions - Flow Manager - [phone] Office  [phone]  Contact MD directly via text page:      [email or web address]      password [email or web address]  01/27/2015, 1:15 PM   LOS: 4 days

## 2015-01-27 NOTE — Progress Notes (Signed)
Medicare Important Message given?  YES (If response is "NO", the following Medicare IM given date fields will be blank) Date Medicare IM given:  01/27/15 Medicare IM given by:  Glynna Failla 

## 2015-01-27 NOTE — Progress Notes (Addendum)
entered in error   

## 2015-01-27 NOTE — Progress Notes (Signed)
Subjective:   Objective: Vital signs in last 24 hours: Temp:  [97.5 F (36.4 C)-98.2 F (36.8 C)] 98 F (36.7 C) (02/15 0654) Pulse Rate:  [60-68] 60 (02/15 0654) Resp:  [14-19] 16 (02/15 0654) BP: (108-129)/(46-75) 128/74 mmHg (02/15 0654) SpO2:  [95 %-100 %] 98 % (02/15 0847) FiO2 (%):  [21 %] 21 % (02/15 0847) Weight:  [274 lb (124.286 kg)-275 lb 9.2 oz (125 kg)] 275 lb 9.2 oz (125 kg) (02/15 0654) Last BM Date: 01/24/15  Intake/Output from previous day: 02/14 0701 - 02/15 0700 In: 339 [P.O.:336; I.V.:3] Out: 1100 [Urine:1100] Intake/Output this shift: Total I/O In: 120 [P.O.:120] Out: -   Medications Current Facility-Administered Medications  Medication Dose Route Frequency Provider Last Rate Last Dose  . acetaminophen (TYLENOL) tablet 650 mg  650 mg Oral Q6H PRN [name], MD       Or  . acetaminophen (TYLENOL) suppository 650 mg  650 mg Rectal Q6H PRN [name], MD      . allopurinol (ZYLOPRIM) tablet 100 mg  100 mg Oral Daily [name], MD   100 mg at 01/27/15 1047  . antiseptic oral rinse (CPC / CETYLPYRIDINIUM CHLORIDE 0.05%) solution 7 mL  7 mL Mouth Rinse BID Allie Bossier, MD   7 mL at 01/26/15 0913  . apixaban (ELIQUIS) tablet 5 mg  5 mg Oral BID Cecilio Asper Batchelder, RPH   5 mg at 01/27/15 1046  . aspirin EC tablet 325 mg  325 mg Oral Daily [name], MD   325 mg at 01/27/15 1047  . azithromycin (ZITHROMAX) tablet 500 mg  500 mg Oral Daily Allie Bossier, MD   500 mg at 01/27/15 1047  . budesonide (PULMICORT) nebulizer solution 0.25 mg  0.25 mg Nebulization BID [name], MD   0.25 mg at 01/27/15 0846  . cefUROXime (CEFTIN) tablet 250 mg  250 mg Oral BID WC Allie Bossier, MD   250 mg at 01/27/15 [phone]  . colchicine tablet 0.6 mg  0.6 mg Oral Daily PRN [name], MD      . doxepin (SINEQUAN) 10 MG/ML solution 3 mg  3 mg Oral QPM [name], MD   3 mg at 01/26/15 1758  . famotidine (PEPCID)  tablet 20 mg  20 mg Oral QHS [name], MD   20 mg at 01/26/15 2204  . glipiZIDE (GLUCOTROL) tablet 2.5 mg  2.5 mg Oral BID AC Allie Bossier, MD   2.5 mg at 01/27/15 [id number]  . insulin aspart (novoLOG) injection 0-9 Units  0-9 Units Subcutaneous TID WC [name], MD   2 Units at 01/26/15 1421  . irbesartan (AVAPRO) tablet 75 mg  75 mg Oral Daily Allie Bossier, MD   75 mg at 01/27/15 1046  . isosorbide-hydrALAZINE (BIDIL) 20-37.5 MG per tablet 1 tablet  1 tablet Oral TID Allie Bossier, MD   1 tablet at 01/27/15 1046  . metoprolol (LOPRESSOR) tablet 50 mg  50 mg Oral BID Allie Bossier, MD   50 mg at 01/27/15 1047  . morphine 2 MG/ML injection 1 mg  1 mg Intravenous Q4H PRN [name], MD      . ondansetron [facility]) tablet 4 mg  4 mg Oral Q6H PRN [name], MD       Or  . ondansetron [facility]) injection 4 mg  4 mg Intravenous Q6H PRN [name], MD      .  pravastatin (PRAVACHOL) tablet 80 mg  80 mg Oral Daily [name], MD   80 mg at 01/27/15 1046  . sodium chloride 0.9 % injection 3 mL  3 mL Intravenous Q12H [name], MD   3 mL at 01/25/15 1008  . sodium chloride 0.9 % injection 3 mL  3 mL Intravenous Q12H [name], MD   3 mL at 01/26/15 2205  . terbinafine (LAMISIL) tablet 250 mg  250 mg Oral Daily [name], MD   250 mg at 01/27/15 1046    PE:     Lab Results:   Recent Labs  01/25/15 0334 01/26/15 0255  WBC 7.0 6.8  HGB 11.4* 11.8*  HCT 35.8* 36.6  PLT 176 195   BMET  Recent Labs  01/25/15 0334 01/26/15 0255 01/27/15 0752  NA 138 140 139  K 3.8 4.0 4.1  CL 101 99 99  CO2 27 34* 30  GLUCOSE 96 128* 122*  BUN 42* 45* 49*  CREATININE 2.13* 2.54* 2.28*  CALCIUM 8.7 9.1 8.6   PT/INR No results for input(s): LABPROT, INR in the last 72 hours. Cholesterol  Recent Labs  01/25/15 0334  CHOL 158   Lipid Panel     Component Value Date/Time   CHOL 158 01/25/2015 0334   TRIG 77  01/25/2015 0334   HDL 46 01/25/2015 0334   CHOLHDL 3.4 01/25/2015 0334   VLDL 15 01/25/2015 0334   LDLCALC 97 01/25/2015 0334    Assessment/Plan  Active Problems:   Essential hypertension   Non-ST elevation MI (NSTEMI)   Diabetes mellitus type 2, controlled   HLD (hyperlipidemia)   Chronic kidney disease, stage III (moderate)   Paroxysmal atrial fibrillation  Rate well controlled on lopressor 50mg  BID, eliquis.  May be SR.  BP stable on Bidil, Avapro.  Net fluids: -0.8L/-2.4L.  She's had three doses of IV lasix and one PO since adm.  None yesterday or today and Scr improved today.      LOS: 4 days    HAGER, BRYAN PA-C 01/27/2015 12:44 PM  The patient was seen, examined and discussed with Tarri Fuller, PA-C and I agree with the above.   69 year old female admitted with acute bronchitis/pneuonia and was found to be in atrial fibrillation - new onset, rate controlled on metoprolol 50 mg po BID. Agree with Eliquis for anticoagulation. She has normal LVEF and only mildly dilated left atrium. I would wait for her acute infection to resolve and follow her in our clinic. If her a-fib persists, we will schedule an outpatient DCCV after 3-4 weeks on Eliquis.    Dorothy Spark 01/27/2015

## 2015-01-28 DIAGNOSIS — I214 Non-ST elevation (NSTEMI) myocardial infarction: Secondary | ICD-10-CM | POA: Insufficient documentation

## 2015-01-28 DIAGNOSIS — E11649 Type 2 diabetes mellitus with hypoglycemia without coma: Secondary | ICD-10-CM | POA: Insufficient documentation

## 2015-01-28 DIAGNOSIS — I5032 Chronic diastolic (congestive) heart failure: Secondary | ICD-10-CM | POA: Insufficient documentation

## 2015-01-28 LAB — BASIC METABOLIC PANEL
Anion gap: 11 (ref 5–15)
BUN: 57 mg/dL — ABNORMAL HIGH (ref 6–23)
CHLORIDE: 103 mmol/L (ref 96–112)
CO2: 27 mmol/L (ref 19–32)
CREATININE: 2.19 mg/dL — AB (ref 0.50–1.10)
Calcium: 9.1 mg/dL (ref 8.4–10.5)
GFR calc non Af Amer: 22 mL/min — ABNORMAL LOW (ref >90)
GFR, EST AFRICAN AMERICAN: 25 mL/min — AB (ref >90)
Glucose, Bld: 66 mg/dL — ABNORMAL LOW (ref 70–99)
POTASSIUM: 4.2 mmol/L (ref 3.5–5.1)
Sodium: 141 mmol/L (ref 135–145)

## 2015-01-28 LAB — GLUCOSE, CAPILLARY
GLUCOSE-CAPILLARY: 80 mg/dL (ref 70–99)
GLUCOSE-CAPILLARY: 114 mg/dL — AB (ref 70–99)

## 2015-01-28 LAB — TROPONIN I

## 2015-01-28 MED ORDER — IRBESARTAN 75 MG PO TABS: 75.0000 mg | ORAL_TABLET | Freq: Every day | ORAL | Status: DC

## 2015-01-28 MED ORDER — METOPROLOL TARTRATE 25 MG PO TABS: 75.0000 mg | ORAL_TABLET | Freq: Two times a day (BID) | ORAL | Status: DC

## 2015-01-28 MED ORDER — ASPIRIN 325 MG PO TBEC: 325.0000 mg | DELAYED_RELEASE_TABLET | Freq: Every day | ORAL | Status: DC

## 2015-01-28 MED ORDER — LEVALBUTEROL HCL 0.63 MG/3ML IN NEBU: 0.6300 mg | INHALATION_SOLUTION | Freq: Four times a day (QID) | RESPIRATORY_TRACT | Status: DC | PRN

## 2015-01-28 MED ORDER — METOPROLOL TARTRATE 50 MG PO TABS
75.0000 mg | ORAL_TABLET | Freq: Two times a day (BID) | ORAL | Status: DC
  Filled 2015-01-28: qty 1

## 2015-01-28 MED ORDER — APIXABAN 5 MG PO TABS: 5.0000 mg | ORAL_TABLET | Freq: Two times a day (BID) | ORAL | Status: DC

## 2015-01-28 NOTE — Evaluation (Signed)
Occupational Therapy Evaluation Patient Details Name: Kathleen [name] MRN: [id number] DOB: 1946-03-17 Today's Date: 01/28/2015    History of Present Illness  69 y.o. BF PMHx CAD status post stenting, CHF last EF measured in October 2015 was 60-65%, hypertension, diabetes mellitus type 2, chronic kidney disease admitted due to SOB and chest pain   Clinical Impression   Pt at sup - Mod I level with mobility and at baseline level of function with ADLs. Pt has home assist from aide 7 days/wk for bathing, home mgt and cooking. All education completed and no further acute OT services indicated at this time    Follow Up Recommendations  No OT follow up;Supervision/Assistance - 24 hour    Equipment Recommendations  None recommended by OT    Recommendations for Other Services       Precautions / Restrictions Precautions Precautions: None Restrictions Weight Bearing Restrictions: No      Mobility Bed Mobility Overal bed mobility: Needs Assistance Bed Mobility: Supine to Sit;Sit to Supine     Supine to sit: Modified independent (Device/Increase time) Sit to supine: Modified independent (Device/Increase time)      Transfers Overall transfer level: Needs assistance Equipment used: Rolling Rendell (2 wheeled) Transfers: Sit to/from Stand Sit to Stand: Supervision              Balance Overall balance assessment: No apparent balance deficits (not formally assessed)                                          ADL Overall ADL's : At baseline                                             Vision  wears glasses at all times   Perception Perception Perception Tested?: No   Praxis Praxis Praxis tested?: Not tested    Pertinent Vitals/Pain Pain Assessment: No/denies pain     Hand Dominance Right   Extremity/Trunk Assessment Upper Extremity Assessment Upper Extremity Assessment: Overall WFL for tasks assessed;Generalized weakness    Lower Extremity Assessment Lower Extremity Assessment: Defer to PT evaluation       Communication Communication Communication: No difficulties   Cognition Arousal/Alertness: Awake/alert Behavior During Therapy: WFL for tasks assessed/performed Overall Cognitive Status: Within Functional Limits for tasks assessed                     General Comments   pt very pleasant and cooperative                 Home Living Family/patient expects to be discharged to:: Private residence Living Arrangements: Alone   Type of Home: Apartment Home Access: Stairs to enter     Home Layout: One level     Bathroom Shower/Tub: Teacher, early years/pre: Standard     Home Equipment: Bedside commode;Shower seat;[place] - 2 wheels;Cane - single point          Prior Functioning/Environment Level of Independence: Needs assistance  Gait / Transfers Assistance Needed: uses cane or RW when out in the community ADL's / Homemaking Assistance Needed: assist from aide 7 days/wk for bathing, cleaning and meal prep        OT Diagnosis: Generalized weakness   OT Problem List:  OT Treatment/Interventions:      OT Goals(Current goals can be found in the care plan section) Acute Rehab OT Goals Patient Stated Goal: go home today  OT Frequency:     Barriers to D/C:   none                        End of Session Equipment Utilized During Treatment: Rolling [place]  Activity Tolerance: Patient tolerated treatment well Patient left: in bed;with call bell/phone within reach   Time: [id number] OT Time Calculation (min): 18 min Charges:  OT General Charges $OT Visit: 1 Procedure OT Evaluation $Initial OT Evaluation Tier I: 1 Procedure OT Treatments $Therapeutic Activity: 8-22 mins G-Codes:    Britt Bottom 01/28/2015, 12:30 PM

## 2015-01-28 NOTE — Progress Notes (Signed)
Patient Name: Kathleen [name] Date of Encounter: 01/28/2015  Active Problems:   Essential hypertension   Non-ST elevation MI (NSTEMI)   Diabetes mellitus type 2, controlled   HLD (hyperlipidemia)   Chronic kidney disease, stage III (moderate)   Paroxysmal atrial fibrillation   Length of Stay: 5  SUBJECTIVE  The patient denies any chest pain or palpitation.  CURRENT MEDS . allopurinol  100 mg Oral Daily  . antiseptic oral rinse  7 mL Mouth Rinse BID  . apixaban  5 mg Oral BID  . aspirin EC  325 mg Oral Daily  . budesonide (PULMICORT) nebulizer solution  0.25 mg Nebulization BID  . doxepin  3 mg Oral QPM  . famotidine  20 mg Oral QHS  . glipiZIDE  2.5 mg Oral BID AC  . insulin aspart  0-9 Units Subcutaneous TID WC  . irbesartan  75 mg Oral Daily  . isosorbide-hydrALAZINE  1 tablet Oral TID  . metoprolol  50 mg Oral BID  . pravastatin  80 mg Oral Daily  . terbinafine  250 mg Oral Daily    OBJECTIVE  Filed Vitals:   01/27/15 2111 01/28/15 0527 01/28/15 0725 01/28/15 0748  BP: 136/46 122/48    Pulse: 91 84    Temp: 97.9 F (36.6 C) 98 F (36.7 C)    TempSrc: Oral     Resp: 18 20    Height:      Weight:   261 lb 0.4 oz (118.4 kg)   SpO2: 98% 99%  94%    Intake/Output Summary (Last 24 hours) at 01/28/15 0843 Last data filed at 01/27/15 1500  Gross per 24 hour  Intake    360 ml  Output      0 ml  Net    360 ml   Filed Weights   01/26/15 1941 01/27/15 0654 01/28/15 0725  Weight: 274 lb (124.286 kg) 275 lb 9.2 oz (125 kg) 261 lb 0.4 oz (118.4 kg)    PHYSICAL EXAM  General: Pleasant, NAD. Neuro: Alert and oriented X 3. Moves all extremities spontaneously. Psych: Normal affect. HEENT:  Normal  Neck: Supple without bruits or JVD. Lungs:  Resp regular and unlabored, CTA. Heart: RRR no s3, s4, or murmurs. Abdomen: Soft, non-tender, non-distended, BS + x 4.  Extremities: No clubbing, cyanosis or edema. DP/PT/Radials 2+ and equal bilaterally.  Accessory  Clinical Findings  CBC  Recent Labs  01/26/15 0255  WBC 6.8  HGB 11.8*  HCT 36.6  MCV 86.9  PLT 0000000   Basic Metabolic Panel  Recent Labs  01/26/15 0255 01/27/15 0752 01/28/15 0618  NA 140 139 141  K 4.0 4.1 4.2  CL 99 99 103  CO2 34* 30 27  GLUCOSE 128* 122* 66*  BUN 45* 49* 57*  CREATININE 2.54* 2.28* 2.19*  CALCIUM 9.1 8.6 9.1  MG 2.0 2.1  --    Liver Function Tests No results for input(s): AST, ALT, ALKPHOS, BILITOT, PROT, ALBUMIN in the last 72 hours. No results for input(s): LIPASE, AMYLASE in the last 72 hours. Cardiac Enzymes  Recent Labs  01/28/15 0618  TROPONINI <0.03   Radiology/Studies  Nm Myocar Multi W/spect W/wall Motion / Ef  01/24/2015   CLINICAL DATA:  69 year old with chest pain, unspecified chest pain type.  IMPRESSION: 1. No reversible ischemia or infarction.  2. Normal left ventricular wall motion.  3. Left ventricular ejection fraction is 54%.  4. Low-risk stress test findings*.  *2012 Appropriate Use Criteria for  Coronary Revascularization Focused Update: J Am Coll Cardiol. [id number]. http://content.[email or web address].aspx?articleid=[email or web address]   Electronically Signed   By: Markus Daft M.D.   On: 01/24/2015 13:58   Dg Chest Port 1 View  01/25/2015   CLINICAL DATA:  COPD exacerbation.  EXAM: PORTABLE CHEST - 1 VIEW  COMPARISON:  09/17/2014  FINDINGS: Stable cardiac and mediastinal contours. Stable enlarged cardiac and mediastinal contours. Pulmonary venous hypertension with mild interstitial pulmonary edema. No large consolidative pulmonary opacities. No definite pleural effusion or pneumothorax.  IMPRESSION: Cardiomegaly with pulmonary venous hypertension and mild interstitial pulmonary edema.   Electronically Signed   By: Lovey Newcomer M.D.   On: 01/25/2015 08:53    TELE: a-fib     ASSESSMENT AND PLAN  Active Problems:   Essential hypertension   Non-ST elevation MI (NSTEMI)   Diabetes mellitus type 2, controlled   HLD  (hyperlipidemia)   Chronic kidney disease, stage III (moderate)   Paroxysmal atrial fibrillation   69 year old female admitted with acute bronchitis/pneumonia and was found to be in atrial fibrillation - new onset, rate alternates from controlled to RVR, I would recommend to increase metoprolol to 75 mg po BID. Agree with Eliquis for anticoagulation. She has normal LVEF and only mildly dilated left atrium. I would wait for her acute infection to resolve and follow her in our clinic. If her a-fib persists, we will schedule an outpatient DCCV after 3-4 weeks on Eliquis.   She can be discharged from cardiology standpoint, we will follow her as outpatient.   Signed, Dorothy Spark MD, [facility] 01/28/2015

## 2015-01-28 NOTE — Discharge Summary (Signed)
Physician Discharge Summary  Kathleen [name] [id number] DOB: [birth year] DOA: 01/23/2015  PCP: Philis Fendt, MD  Admit date: 01/23/2015 Discharge date: 01/28/2015  Time spent: 40 minutes  Recommendations for Outpatient Follow-up:  Shortness of breath/ COPD exacerbation/CAP - mostly likely combination of CHF, smoking and COPD; resolved -Counseled patient that we will hold any further diuresis secondary to Cr increasing.  -Will consider 2/15 a.m. weight as new baseline weight -Azithromycin + Ceftin ; complete 5 day course   Non-ST elevation MI  - S/P Two part Lexiscan   Chronic diastolic CHF -IAspirin [id number] mg daily -Continue Bidil 20-30 7.5 TID, -Continue Irbesartan 75 mg Daily  -Continue metoprolol 75 mg BID -Would hold on Lasix until patient sees cardiologist at follow-up. -Discharge weight 2/16 weight= 118.4 kg  -Patient instructed to weigh herself on her scale as soon as she returned home today and record in her journal this will be her dry weight. Patient is then to weigh herself each morning and record in journal.  Sliding scale Lasix: Weigh yourself when you get home, then Daily in the Morning. Your dry weight will be what your scale says on the day you return home..   If you gain more than 3 pounds from dry weight: Start Lasix dosing to 40mg  in the morning until weight returns to baseline dry weight.  If weight gain is greater than 5 pounds in 2 days: Increased to Lasix 40 mg twice a day and contact the office for further assistance if weight does not go down the next day.  Follow-up with cardiologist as directed  Paroxysmal atrial fibrillation -Currently in NSR -Continue Eliquis 5 mg BID  Diabetes mellitus type 2 controlled -2/11 hemoglobin A1c= 6.5 - Glipizide 5 mg BID -Maintain all of all FSBS obtain before each meal and before bed, and food consumed. Take log with you to follow-up at PCP -PCP to adjust diabetic medication; follow-up in one  week  Hypertension  - See chronic diastolic CHF   Chronic kidney disease stage III  - Counseled patient that her previous dry weight is now invalid. Patient at her dry weight. -Discharge dry weight 118.4 kg -Patient to weigh herself on scale as soon as she returned home and record as her dry weight.  Hyperlipidemia  -Continue Pravachol 80 mg daily  -Lipid panel within ADA guidelines       Discharge Diagnoses:  Active Problems:   Essential hypertension   Non-ST elevation MI (NSTEMI)   Diabetes mellitus type 2, controlled   HLD (hyperlipidemia)   Chronic kidney disease, stage III (moderate)   Paroxysmal atrial fibrillation   NSTEMI (non-ST elevated myocardial infarction)   Chronic diastolic CHF (congestive heart failure)   Diabetes type 2, controlled   Discharge Condition: Stable  Diet recommendation: American diabetic Association  Filed Weights   01/26/15 1941 01/27/15 0654 01/28/15 0725  Weight: 124.286 kg (274 lb) 125 kg (275 lb 9.2 oz) 118.4 kg (261 lb 0.4 oz)    History of present illness:  Kathleen [name] is a 69 y.o. BF PMHx CAD status post stenting, CHF last EF measured in October 2015 was 60-65%, hypertension, diabetes mellitus type 2, chronic kidney disease Presents to the ER because of worsening shortness of breath and chest pain. Patient states her symptoms started 3-4 days ago with shortness of breath wheezing and productive cough. Denies any fever or chills. Over the last 2 days patient also has been having progressively worsening chest pain which is retrosternal nonradiating pressure-like. In the ER  patient was found to have lower extremity edema and on exam has bilateral expiratory wheeze. Chest x-ray shows congestion and interstitial edema. Troponin is mildly positive. Patient has been admitted for further management of her shortness of breath which at this time probably a combination of CHF and COPD with non-ST elevation MI. Patient denies any nausea  vomiting abdominal pain diarrhea. During hospitalization patient was treated for COPD exacerbation/CAP with appropriate antibiotics for 5 days. Patient was also evaluated for NSTEMI Two part [place] which was negative for infarct. In addition patient was treated for decompensated, chronic diastolic CHF.     Consultants: Skip Mayer (cardiology)  Procedure/Significant Events: 2/11 echocardiogram;- LVEF= 55% to 60%. - (grade 2 diastolic dysfunction). 2/11 two part Lexiscan No reversible ischemia or infarction. -LVEF=54%. - Low-risk stress test findings*.  Culture   Antibiotics: Azithromycin 2/11>> stopped 2/15 Ceftriaxone 2/11>> stopped 2/14 Ceftin 2/14>> stopped 2/15   Discharge Exam: Filed Vitals:   01/27/15 2111 01/28/15 0527 01/28/15 0725 01/28/15 0748  BP: 136/46 122/48    Pulse: 91 84    Temp: 97.9 F (36.6 C) 98 F (36.7 C)    TempSrc: Oral     Resp: 18 20    Height:      Weight:   118.4 kg (261 lb 0.4 oz)   SpO2: 98% 99%  94%    General: A/O 4, NAD, No acute respiratory distress Lungs: Clear to auscultation bilaterally, except for decreased breath sounds bibasilar, without wheezes or crackles Cardiovascular: Regular rhythm and rate without murmur gallop or rub normal S1 and S2 Abdomen: Nontender, nondistended, soft, bowel sounds positive, no rebound, no ascites, no appreciable mass Extremities: No significant cyanosis, clubbing, or edema bilateral lower extremities   Discharge Instructions     Medication List    STOP taking these medications        chlorthalidone 25 MG tablet  Commonly known as:  HYGROTON     furosemide 40 MG tablet  Commonly known as:  LASIX     telmisartan 40 MG tablet  Commonly known as:  MICARDIS     telmisartan 80 MG tablet  Commonly known as:  MICARDIS  Replaced by:  irbesartan 75 MG tablet      TAKE these medications        albuterol 108 (90 BASE) MCG/ACT inhaler  Commonly known as:  PROVENTIL HFA;VENTOLIN HFA   Inhale 1-2 puffs into the lungs every 6 (six) hours as needed for wheezing or shortness of breath.     allopurinol 100 MG tablet  Commonly known as:  ZYLOPRIM  Take 100 mg by mouth daily.     apixaban 5 MG Tabs tablet  Commonly known as:  ELIQUIS  Take 1 tablet (5 mg total) by mouth 2 (two) times daily.     aspirin 325 MG EC tablet  Take 1 tablet (325 mg total) by mouth daily.     colchicine 0.6 MG tablet  Take 0.6 mg by mouth daily as needed (for gout flare ups).     Doxepin HCl 3 MG Tabs  Take 1 tablet by mouth every evening.     famotidine 20 MG tablet  Commonly known as:  PEPCID  Take 20 mg by mouth at bedtime.     glipiZIDE 5 MG tablet  Commonly known as:  GLUCOTROL  Take 1 tablet (5 mg total) by mouth 2 (two) times daily before a meal.     irbesartan 75 MG tablet  Commonly known as:  AVAPRO  Take 1 tablet (75 mg total) by mouth daily.     isosorbide-hydrALAZINE 20-37.5 MG per tablet  Commonly known as:  BIDIL  Take 1 tablet by mouth 3 (three) times daily.     metoprolol tartrate 25 MG tablet  Commonly known as:  LOPRESSOR  Take 3 tablets (75 mg total) by mouth 2 (two) times daily.     pravastatin 80 MG tablet  Commonly known as:  PRAVACHOL  Take 80 mg by mouth daily.     pravastatin 80 MG tablet  Commonly known as:  PRAVACHOL  Take 1 tablet (80 mg total) by mouth daily.     terbinafine 250 MG tablet  Commonly known as:  LAMISIL  Take 250 mg by mouth daily.       Allergies  Allergen Reactions  . Penicillins Other (See Comments)    unknown   Follow-up Information    Follow up with Minus Breeding, MD On 02/12/2015.   Specialty:  Cardiology   Why:  3:00 PM   Contact information:   [address] [place] [address] [phone]       Follow up with Nolene Ebbs A, MD. Schedule an appointment as soon as possible for a visit in 1 week.   Specialty:  Internal Medicine   Why:  Hospital follow-up; COPD exacerbation, chronic diastolic  CHF, diabetes type 2 controlled   Contact information:   [place] [address] [phone]        The results of significant diagnostics from this hospitalization (including imaging, microbiology, ancillary and laboratory) are listed below for reference.    Significant Diagnostic Studies: Dg Chest 2 View  01/23/2015   CLINICAL DATA:  Shortness of breath, leg swelling. History of hypertension, diabetes and CHF.  EXAM: CHEST  2 VIEW  COMPARISON:  Chest radiograph September 17, 2014  FINDINGS: The cardiac silhouette is mild to moderately enlarged, unchanged. Calcified aortic knob. Mild interstitial prominence with pulmonary vascular congestion, no pleural effusion or focal consolidation. No pneumothorax.  Mild osteopenia. Moderate degenerative change of thoracic spine. Surgical clips in the included right abdomen likely reflect cholecystectomy.  IMPRESSION: Stable cardiomegaly. Pulmonary vascular congestion and interstitial prominence most consistent with pulmonary edema.   Electronically Signed   By: Elon Alas   On: 01/23/2015 01:46   Nm Myocar Multi W/spect W/wall Motion / Ef  01/24/2015   CLINICAL DATA:  69 year old with chest pain, unspecified chest pain type.  EXAM: MYOCARDIAL IMAGING WITH SPECT (REST AND PHARMACOLOGIC-STRESS - 2 DAY PROTOCOL)  GATED LEFT VENTRICULAR WALL MOTION STUDY  LEFT VENTRICULAR EJECTION FRACTION  TECHNIQUE: Standard myocardial SPECT imaging was performed after resting intravenous injection of 30 mCi Tc-29m sestamibi. Subsequently, on a second day, intravenous infusion of Lexiscan was performed under the supervision of the Cardiology staff. At peak effect of the drug, 30 mCi Tc-50m sestamibi was injected intravenously and standard myocardial SPECT imaging was performed. Quantitative gated imaging was also performed to evaluate left ventricular wall motion, and estimate left ventricular ejection fraction.  COMPARISON:  None.  FINDINGS: Perfusion: No  decreased activity in the left ventricle on stress imaging to suggest reversible ischemia or infarction. Slightly decreased uptake along the inferior wall on both the rest and stress images is suggestive for diaphragmatic attenuation.  Wall Motion: Normal left ventricular wall motion. No left ventricular dilation.  Left Ventricular Ejection Fraction: 54 %  End diastolic volume 0000000 ml  End systolic volume 64 ml  IMPRESSION: 1. No reversible ischemia or infarction.  2. Normal left ventricular wall motion.  3. Left ventricular ejection fraction is 54%.  4. Low-risk stress test findings*.  *2012 Appropriate Use Criteria for Coronary Revascularization Focused Update: J Am Coll Cardiol. [id number]. http://content.airportbarriers.com.aspx?articleid=1201161   Electronically Signed   By: Markus Daft M.D.   On: 01/24/2015 13:58   Dg Chest Port 1 View  01/25/2015   CLINICAL DATA:  COPD exacerbation.  EXAM: PORTABLE CHEST - 1 VIEW  COMPARISON:  09/17/2014  FINDINGS: Stable cardiac and mediastinal contours. Stable enlarged cardiac and mediastinal contours. Pulmonary venous hypertension with mild interstitial pulmonary edema. No large consolidative pulmonary opacities. No definite pleural effusion or pneumothorax.  IMPRESSION: Cardiomegaly with pulmonary venous hypertension and mild interstitial pulmonary edema.   Electronically Signed   By: Lovey Newcomer M.D.   On: 01/25/2015 08:53    Microbiology: Recent Results (from the past 240 hour(s))  MRSA PCR Screening     Status: None   Collection Time: 01/23/15  7:44 AM  Result Value Ref Range Status   MRSA by PCR NEGATIVE NEGATIVE Final    Comment:        The GeneXpert MRSA Assay (FDA approved for NASAL specimens only), is one component of a comprehensive MRSA colonization surveillance program. It is not intended to diagnose MRSA infection nor to guide or monitor treatment for MRSA infections.      Labs: Basic Metabolic Panel:  Recent Labs Lab  01/24/15 0137 01/25/15 0334 01/26/15 0255 01/27/15 0752 01/28/15 0618  NA 141 138 140 139 141  K 4.1 3.8 4.0 4.1 4.2  CL 102 101 99 99 103  CO2 25 27 34* 30 27  GLUCOSE 119* 96 128* 122* 66*  BUN 39* 42* 45* 49* 57*  CREATININE 1.96* 2.13* 2.54* 2.28* 2.19*  CALCIUM 9.2 8.7 9.1 8.6 9.1  MG  --  1.9 2.0 2.1  --    Liver Function Tests:  Recent Labs Lab 01/24/15 0137  AST 27  ALT 19  ALKPHOS 76  BILITOT 1.1  PROT 7.0  ALBUMIN 3.0*   No results for input(s): LIPASE, AMYLASE in the last 168 hours. No results for input(s): AMMONIA in the last 168 hours. CBC:  Recent Labs Lab 01/23/15 0101 01/23/15 0703 01/24/15 0137 01/25/15 0334 01/26/15 0255  WBC 12.0* 12.1* 9.5 7.0 6.8  NEUTROABS 9.1*  --   --   --   --   HGB 12.1 11.5* 11.7* 11.4* 11.8*  HCT 37.1 35.8* 35.1* 35.8* 36.6  MCV 86.9 88.0 86.9 89.1 86.9  PLT 182 188 169 176 195   Cardiac Enzymes:  Recent Labs Lab 01/23/15 0101 01/23/15 0703 01/23/15 1250 01/23/15 1845 01/28/15 0618  TROPONINI 0.04* 0.06* 0.07* 0.09* <0.03   BNP: BNP (last 3 results)  Recent Labs  01/23/15 0101  BNP 676.7*    ProBNP (last 3 results)  Recent Labs  04/07/14 1609 09/17/14 0544  PROBNP 1679.0* 4469.0*    CBG:  Recent Labs Lab 01/27/15 1230 01/27/15 1717 01/27/15 2124 01/28/15 0813 01/28/15 1201  GLUCAP 116* 110* 116* 80 114*       Signed:  Dia Crawford, MD [facility] [phone] pager

## 2015-01-28 NOTE — Progress Notes (Signed)
Discharge instructions reviewed with patient and family.  Discusses new medications and Discharge medications. Written instructions provided.  Reviewed follow up appointments also.  All voice understanding to teaching. To door via wheelchair.  Home via [place] with her daughter driving.

## 2015-02-04 ENCOUNTER — Telehealth: Payer: Self-pay | Admitting: Cardiology

## 2015-02-04 NOTE — Telephone Encounter (Signed)
New message     Pt c/o Shortness Of Breath: STAT if SOB developed within the last 24 hours or pt is noticeably SOB on the phone  1. Are you currently SOB (can you hear that pt is SOB on the phone)? Pt is sob now but I cannot hear it over the phone  2. How long have you been experiencing SOB? 2 days---pt released from hosp on 2-16  3. Are you SOB when sitting or when up moving around? both  4. Are you currently experiencing any other symptoms? headache

## 2015-02-05 NOTE — Telephone Encounter (Signed)
I spoke with the patient today and she feels fine.  She thinks that she had gas.  She reports that she is not SOB.

## 2015-02-05 NOTE — Telephone Encounter (Signed)
Pt. Called no answer, Left message for her to call me, I also suggested she call her primary doctor or her lung doctor if she has one

## 2015-02-11 ENCOUNTER — Telehealth: Payer: Self-pay | Admitting: Cardiology

## 2015-02-11 NOTE — Telephone Encounter (Signed)
Pt. To be seen 02/12/2015

## 2015-02-11 NOTE — Telephone Encounter (Signed)
Pt c/o swelling: STAT is pt has developed SOB within 24 hours  1. How long have you been experiencing swelling? A couple of days  2. Where is the swelling located? Feet and ankles  3.  Are you currently taking a "fluid pill"?yes BID  4.  Are you currently SOB? NO  5.  Have you traveled recently?NO

## 2015-02-12 ENCOUNTER — Encounter: Payer: Self-pay | Admitting: Cardiology

## 2015-02-12 ENCOUNTER — Ambulatory Visit (INDEPENDENT_AMBULATORY_CARE_PROVIDER_SITE_OTHER): Payer: Medicare Other | Admitting: Cardiology

## 2015-02-12 VITALS — BP 142/80 | HR 74 | Ht 64.0 in | Wt 274.0 lb

## 2015-02-12 DIAGNOSIS — I214 Non-ST elevation (NSTEMI) myocardial infarction: Secondary | ICD-10-CM | POA: Diagnosis not present

## 2015-02-12 DIAGNOSIS — R0989 Other specified symptoms and signs involving the circulatory and respiratory systems: Secondary | ICD-10-CM

## 2015-02-12 DIAGNOSIS — I10 Essential (primary) hypertension: Secondary | ICD-10-CM

## 2015-02-12 DIAGNOSIS — I480 Paroxysmal atrial fibrillation: Secondary | ICD-10-CM

## 2015-02-12 MED ORDER — FUROSEMIDE 20 MG PO TABS: 20.0000 mg | ORAL_TABLET | Freq: Every day | ORAL | Status: DC

## 2015-02-12 NOTE — Patient Instructions (Addendum)
Your physician recommends that you schedule a follow-up appointment in: 3 weeks with Dr. Percival Spanish  Stop taking your ASA  We are scheduling a carotid doppler  We are placing a monitor on you for 24 hrs  Start taking lasix 20 mg daily

## 2015-02-12 NOTE — Progress Notes (Signed)
HPI The patient presents for evaluation after hospitalization. She came in with a probable exacerbation of COPD. She did have some minimally elevated enzymes. She had a negative stress perfusion study. I do note that she had atrial fibrillation briefly in the hospital. This was treated with rate control. She was started on Eliquis. She doesn't notice that she's having any further palpitations. She's not had any chest pressure, neck or arm discomfort. She does have some shortness of breath sometimes an hour after she lies down at night. Sometimes she has shortness of breath with activities. She's had a little bit of edema. I'm not sure what her baseline weight was in her scale has been broken.  I reviewed the hospital records. She had normal EF with some diastolic dysfunction on echo. Her BNP was mildly elevated greater than 600.  Allergies  Allergen Reactions  . Penicillins Other (See Comments)    unknown    Current Outpatient Prescriptions  Medication Sig Dispense Refill  . albuterol (PROVENTIL HFA;VENTOLIN HFA) 108 (90 BASE) MCG/ACT inhaler Inhale 1-2 puffs into the lungs every 6 (six) hours as needed for wheezing or shortness of breath. 1 Inhaler 0  . allopurinol (ZYLOPRIM) 100 MG tablet Take 100 mg by mouth daily.    Marland Kitchen apixaban (ELIQUIS) 5 MG TABS tablet Take 1 tablet (5 mg total) by mouth 2 (two) times daily. 60 tablet 0  . aspirin EC 325 MG EC tablet Take 1 tablet (325 mg total) by mouth daily. 30 tablet 0  . colchicine 0.6 MG tablet Take 0.6 mg by mouth daily as needed (for gout flare ups).     . famotidine (PEPCID) 20 MG tablet Take 20 mg by mouth at bedtime.     Marland Kitchen glipiZIDE (GLUCOTROL) 5 MG tablet Take 1 tablet (5 mg total) by mouth 2 (two) times daily before a meal. 60 tablet 1  . irbesartan (AVAPRO) 75 MG tablet Take 1 tablet (75 mg total) by mouth daily. 30 tablet 0  . isosorbide-hydrALAZINE (BIDIL) 20-37.5 MG per tablet Take 1 tablet by mouth 3 (three) times daily. 90 tablet 1  .  metoprolol (LOPRESSOR) 25 MG tablet Take 3 tablets (75 mg total) by mouth 2 (two) times daily. 90 tablet 0  . pravastatin (PRAVACHOL) 80 MG tablet Take 80 mg by mouth daily.    Marland Kitchen terbinafine (LAMISIL) 250 MG tablet Take 250 mg by mouth daily.  0  . pravastatin (PRAVACHOL) 80 MG tablet Take 1 tablet (80 mg total) by mouth daily. 30 tablet 6   No current facility-administered medications for this visit.    Past Medical History  Diagnosis Date  . Hypertension   . Coronary artery disease     Cypher stent to the RCA in 2003  . Blood transfusion     no side affects  . GERD (gastroesophageal reflux disease)   . Arthritis   . Diabetes mellitus     Borderline  . CHF (congestive heart failure)   . Shortness of breath     Past Surgical History  Procedure Laterality Date  . Total knee arthroplasty    . Cholecystectomy    . Coronary angioplasty with stent placement      ROS:  URI symptoms.  Otherwise as stated in the HPI and negative for all other systems.  PHYSICAL EXAM BP 142/80 mmHg  Pulse 74  Ht 5\' 4"  (1.626 m)  Wt 274 lb (124.286 kg)  BMI 47.01 kg/m2 GENERAL:  Well appearing HEENT:  Pupils equal round  and reactive, fundi not visualized, oral mucosa unremarkable, dentures NECK:  No jugular venous distention, waveform within normal limits, carotid upstroke brisk and symmetric, right bruit, no thyromegaly LUNGS:  Clear to auscultation bilaterally BACK:  No CVA tenderness CHEST:  Unremarkable HEART:  PMI not displaced or sustained,S1 and S2 within normal limits, no S3, no S4, no clicks, no rubs, no murmurs ABD:  Flat, positive bowel sounds normal in frequency in pitch, no bruits, no rebound, no guarding, no midline pulsatile mass, no hepatomegaly, no splenomegaly, obese EXT:  2 plus pulses throughout, trace leg edema, no cyanosis no clubbing   EKG:  Atrial fibrillation, rate 94 axis within normal limits, premature ectopic complexes, possible old anteroseptal infarct.  02/12/2015   ASSESSMENT AND PLAN  ATRIAL FIB She doesn't know that she is in fibrillation. I will place a 24-hour Holter to make sure she has reasonable rate control and to see if this is paroxysmal. She's already on anticoagulation. For now she'll continue the meds as listed.  C A D -  The patient has no new sypmtoms.  With no ischemia on stress testing of further workup is suggested.  HYPERTENSION, UNSPECIFIED -  The blood pressure is at target. No change in medications is indicated. We will continue with therapeutic lifestyle changes (TLC).   OBESITY -  The patient understands the need to lose weight with diet and exercise.   Carotid stenosis - She does have 40-59% left carotid stenosis and less than 40%.  She is overdue for follow-up and I will schedule Dopplers.  Edema - The patient does have some mild edema and dyspnea.  I suspect she might have some acute on chronic diastolic dysfunction. We talked about salt restriction. I'm going to give her 20 mg of Lasix.  Some of this could be causing her fibrillation.

## 2015-02-18 ENCOUNTER — Ambulatory Visit (HOSPITAL_COMMUNITY)
Admission: RE | Admit: 2015-02-18 | Discharge: 2015-02-18 | Disposition: A | Discharge status: Home / Self Care | Payer: Medicare Other | Source: Ambulatory Visit | Attending: Cardiology | Admitting: Cardiology

## 2015-02-18 ENCOUNTER — Other Ambulatory Visit: Payer: Self-pay | Admitting: *Deleted

## 2015-02-18 DIAGNOSIS — R0989 Other specified symptoms and signs involving the circulatory and respiratory systems: Secondary | ICD-10-CM | POA: Diagnosis not present

## 2015-02-18 DIAGNOSIS — I779 Disorder of arteries and arterioles, unspecified: Secondary | ICD-10-CM

## 2015-02-18 MED ORDER — METOPROLOL TARTRATE 25 MG PO TABS: 75.0000 mg | ORAL_TABLET | Freq: Two times a day (BID) | ORAL | Status: DC

## 2015-02-18 NOTE — Progress Notes (Addendum)
Carotid Duplex Completed. Stable mild plaque in both ICAs with no evidence of a significant stenosis. Oda Cogan, BS, RDMS, RVT

## 2015-02-18 NOTE — Addendum Note (Signed)
Addended by: Vear Clock on: 02/18/2015 04:13 PM   Modules accepted: Orders

## 2015-02-18 NOTE — Telephone Encounter (Signed)
Pt came in for imaging. Requested refill while here. I filled this request.  Called pt & left message informing of this.

## 2015-03-10 ENCOUNTER — Encounter: Payer: Self-pay | Admitting: *Deleted

## 2015-03-10 ENCOUNTER — Ambulatory Visit (INDEPENDENT_AMBULATORY_CARE_PROVIDER_SITE_OTHER): Payer: Medicare Other | Admitting: Cardiology

## 2015-03-10 ENCOUNTER — Other Ambulatory Visit: Payer: Self-pay | Admitting: Cardiology

## 2015-03-10 VITALS — BP 128/62 | HR 72 | Ht 64.0 in | Wt 267.9 lb

## 2015-03-10 DIAGNOSIS — I480 Paroxysmal atrial fibrillation: Secondary | ICD-10-CM | POA: Diagnosis not present

## 2015-03-10 LAB — CBC
HCT: 40.3 % (ref 36.0–46.0)
Hemoglobin: 12.8 g/dL (ref 12.0–15.0)
MCH: 27.9 pg (ref 26.0–34.0)
MCHC: 31.8 g/dL (ref 30.0–36.0)
MCV: 88 fL (ref 78.0–100.0)
MPV: 11.7 fL (ref 8.6–12.4)
PLATELETS: 235 10*3/uL (ref 150–400)
RBC: 4.58 MIL/uL (ref 3.87–5.11)
RDW: 16 % — AB (ref 11.5–15.5)
WBC: 9.5 10*3/uL (ref 4.0–10.5)

## 2015-03-10 LAB — BASIC METABOLIC PANEL WITH GFR
BUN: 43 mg/dL — ABNORMAL HIGH (ref 6–23)
CO2: 28 mEq/L (ref 19–32)
Calcium: 9.5 mg/dL (ref 8.4–10.5)
Chloride: 100 mEq/L (ref 96–112)
Creat: 1.91 mg/dL — ABNORMAL HIGH (ref 0.50–1.10)
GFR, EST AFRICAN AMERICAN: 30 mL/min — AB
GFR, EST NON AFRICAN AMERICAN: 26 mL/min — AB
Glucose, Bld: 118 mg/dL — ABNORMAL HIGH (ref 70–99)
POTASSIUM: 4.5 meq/L (ref 3.5–5.3)
Sodium: 141 mEq/L (ref 135–145)

## 2015-03-10 MED ORDER — FUROSEMIDE 20 MG PO TABS: 40.0000 mg | ORAL_TABLET | Freq: Every day | ORAL | Status: DC

## 2015-03-10 MED ORDER — APIXABAN 5 MG PO TABS: 5.0000 mg | ORAL_TABLET | Freq: Two times a day (BID) | ORAL | Status: DC

## 2015-03-10 MED ORDER — ISOSORB DINITRATE-HYDRALAZINE 20-37.5 MG PO TABS: 1.0000 | ORAL_TABLET | Freq: Three times a day (TID) | ORAL | Status: DC

## 2015-03-10 NOTE — Patient Instructions (Addendum)
Your physician has recommended that you have a Cardioversion (DCCV). Electrical Cardioversion uses a jolt of electricity to your heart either through paddles or wired patches attached to your chest. This is a controlled, usually prescheduled, procedure. Defibrillation is done under light anesthesia in the hospital, and you usually go home the day of the procedure. This is done to get your heart back into a normal rhythm. You are not awake for the procedure. Please see the instruction sheet given to you today.   Your physician recommends that you HAVE LAB WORK TODAY  INCREASE FUROSEMIDE TO 40 MG ONCE DAILY= 2 OF THE 20 MG TABLETS ONCE DAILY

## 2015-03-10 NOTE — Progress Notes (Signed)
HPI The patient presents for evaluation of atrial fib. She was in the hospital with a probable exacerbation of COPD. She did have some minimally elevated enzymes. She had a negative stress perfusion study. I do note that she had atrial fibrillation n the hospital. This was treated with rate control. She was started on Eliquis. She had normal EF with some diastolic dysfunction on echo. Her BNP was mildly elevated greater than 600.  When I saw her in the office she was in atrial flutter. She wore a monitor which demonstrated persistent flutter with good rate control. She returns for follow-up. She does say she is having some increasing dyspnea with activities. She still gets short of breath mostly at night about an hour after she goes to sleep. She's had some mildly increased edema. She says she watches her salt her feet elevated.  Allergies  Allergen Reactions  . Penicillins Other (See Comments)    unknown    Current Outpatient Prescriptions  Medication Sig Dispense Refill  . albuterol (PROVENTIL HFA;VENTOLIN HFA) 108 (90 BASE) MCG/ACT inhaler Inhale 1-2 puffs into the lungs every 6 (six) hours as needed for wheezing or shortness of breath. 1 Inhaler 0  . allopurinol (ZYLOPRIM) 100 MG tablet Take 100 mg by mouth daily.    Marland Kitchen apixaban (ELIQUIS) 5 MG TABS tablet Take 1 tablet (5 mg total) by mouth 2 (two) times daily. 60 tablet 0  . colchicine 0.6 MG tablet Take 0.6 mg by mouth daily as needed (for gout flare ups).     . famotidine (PEPCID) 20 MG tablet Take 20 mg by mouth at bedtime.     . furosemide (LASIX) 20 MG tablet Take 1 tablet (20 mg total) by mouth daily. 90 tablet 3  . glipiZIDE (GLUCOTROL) 5 MG tablet Take 1 tablet (5 mg total) by mouth 2 (two) times daily before a meal. 60 tablet 1  . irbesartan (AVAPRO) 75 MG tablet Take 1 tablet (75 mg total) by mouth daily. 30 tablet 0  . isosorbide-hydrALAZINE (BIDIL) 20-37.5 MG per tablet Take 1 tablet by mouth 3 (three) times daily. 90 tablet 1    . metoprolol tartrate (LOPRESSOR) 25 MG tablet Take 3 tablets (75 mg total) by mouth 2 (two) times daily. 180 tablet 5  . pravastatin (PRAVACHOL) 80 MG tablet Take 1 tablet (80 mg total) by mouth daily. 30 tablet 6  . pravastatin (PRAVACHOL) 80 MG tablet Take 80 mg by mouth daily.    Marland Kitchen telmisartan (MICARDIS) 40 MG tablet Take 1 tablet by mouth daily.    Marland Kitchen terbinafine (LAMISIL) 250 MG tablet Take 250 mg by mouth daily.  0   No current facility-administered medications for this visit.    Past Medical History  Diagnosis Date  . Hypertension   . Coronary artery disease     Cypher stent to the RCA in 2003  . Blood transfusion     no side affects  . GERD (gastroesophageal reflux disease)   . Arthritis   . Diabetes mellitus     Borderline  . CHF (congestive heart failure)   . Shortness of breath     Past Surgical History  Procedure Laterality Date  . Total knee arthroplasty    . Cholecystectomy    . Coronary angioplasty with stent placement      ROS:  URI symptoms.  Otherwise as stated in the HPI and negative for all other systems.  PHYSICAL EXAM BP 128/62 mmHg  Pulse 72  Ht 5\' 4"  (1.626  m)  Wt 267 lb 14.4 oz (121.519 kg)  BMI 45.96 kg/m2 GENERAL:  Well appearing HEENT:  Pupils equal round and reactive, fundi not visualized, oral mucosa unremarkable, dentures NECK:  No jugular venous distention, waveform within normal limits, carotid upstroke brisk and symmetric, right bruit, no thyromegaly LUNGS:  Clear to auscultation bilaterally BACK:  No CVA tenderness CHEST:  Unremarkable HEART:  PMI not displaced or sustained,S1 and S2 within normal limits, no S3, no clicks, no rubs, no murmurs, irregular ABD:  Flat, positive bowel sounds normal in frequency in pitch, no bruits, no rebound, no guarding, no midline pulsatile mass, no hepatomegaly, no splenomegaly, obese EXT:  2 plus pulses throughout, mild bilateral leg edema, no cyanosis no clubbing   ASSESSMENT AND PLAN  ATRIAL  FIB She doesn't know that she is in fibrillation/flutter.  However, she has some increased edema and shortness of breath which could be related. She is tolerating anticoagulation. I'm going to set her up for cardioversion. We discussed the risks benefits of this.  C A D -  The patient has no new sypmtoms.  With no ischemia on stress testing no further workup is suggested.  HYPERTENSION, UNSPECIFIED -  The blood pressure is at target. No change in medications is indicated. We will continue with therapeutic lifestyle changes (TLC).  Of note we clarified and she is not taking Micardis.    OBESITY -  The patient understands the need to lose weight with diet and exercise and she has lost some weight    Carotid stenosis - She at recent very mild plaque. We will follow this periodically.  Edema - The patient does have some mild edema and dyspnea.  I suspect she might have some acute on chronic diastolic dysfunction. We talked about salt restriction. I am going to increase her Lasix to 40 mg daily. She's going to see the nephrologist in a few days and I instructed her to get a basic metabolic profile.

## 2015-03-11 ENCOUNTER — Telehealth: Payer: Self-pay | Admitting: Cardiology

## 2015-03-11 ENCOUNTER — Encounter: Payer: Self-pay | Admitting: Cardiology

## 2015-03-11 ENCOUNTER — Other Ambulatory Visit: Payer: Self-pay | Admitting: *Deleted

## 2015-03-11 DIAGNOSIS — I480 Paroxysmal atrial fibrillation: Secondary | ICD-10-CM

## 2015-03-11 MED ORDER — IRBESARTAN 75 MG PO TABS: 75.0000 mg | ORAL_TABLET | Freq: Every day | ORAL | Status: DC

## 2015-03-11 NOTE — Telephone Encounter (Signed)
Pt saw Dr Warren Lacy yesterday and she was told to let him know which medicine she is taking. Pt says she is taking Irbesartan and she is out of it. It need to be ordered,please call to [facility]. She does not take Telmisartan.

## 2015-03-11 NOTE — Telephone Encounter (Signed)
E-sent prescription for irbesartan x 6 refill Patient states she is not taking telmisartan

## 2015-03-12 ENCOUNTER — Other Ambulatory Visit: Payer: Self-pay | Admitting: *Deleted

## 2015-03-12 ENCOUNTER — Encounter: Payer: Self-pay | Admitting: *Deleted

## 2015-03-17 ENCOUNTER — Ambulatory Visit (HOSPITAL_COMMUNITY)
Admission: RE | Admit: 2015-03-17 | Discharge: 2015-03-17 | Disposition: A | Discharge status: Home / Self Care | Payer: Medicare Other | Source: Ambulatory Visit | Attending: Cardiology | Admitting: Cardiology

## 2015-03-17 ENCOUNTER — Other Ambulatory Visit: Payer: Self-pay | Admitting: Cardiology

## 2015-03-17 ENCOUNTER — Encounter (HOSPITAL_COMMUNITY)
Admission: RE | Disposition: A | Discharge status: Home / Self Care | Payer: Medicare Other | Source: Ambulatory Visit | Attending: Cardiology

## 2015-03-17 ENCOUNTER — Ambulatory Visit (HOSPITAL_COMMUNITY): Payer: Medicare Other | Admitting: Certified Registered Nurse Anesthetist

## 2015-03-17 ENCOUNTER — Encounter (HOSPITAL_COMMUNITY): Payer: Self-pay | Admitting: *Deleted

## 2015-03-17 DIAGNOSIS — I739 Peripheral vascular disease, unspecified: Secondary | ICD-10-CM | POA: Insufficient documentation

## 2015-03-17 DIAGNOSIS — E785 Hyperlipidemia, unspecified: Secondary | ICD-10-CM | POA: Diagnosis not present

## 2015-03-17 DIAGNOSIS — E119 Type 2 diabetes mellitus without complications: Secondary | ICD-10-CM | POA: Diagnosis not present

## 2015-03-17 DIAGNOSIS — Z6841 Body Mass Index (BMI) 40.0 and over, adult: Secondary | ICD-10-CM | POA: Diagnosis not present

## 2015-03-17 DIAGNOSIS — I252 Old myocardial infarction: Secondary | ICD-10-CM | POA: Diagnosis not present

## 2015-03-17 DIAGNOSIS — F17210 Nicotine dependence, cigarettes, uncomplicated: Secondary | ICD-10-CM | POA: Insufficient documentation

## 2015-03-17 DIAGNOSIS — N183 Chronic kidney disease, stage 3 (moderate): Secondary | ICD-10-CM | POA: Diagnosis not present

## 2015-03-17 DIAGNOSIS — Z8673 Personal history of transient ischemic attack (TIA), and cerebral infarction without residual deficits: Secondary | ICD-10-CM | POA: Diagnosis not present

## 2015-03-17 DIAGNOSIS — K219 Gastro-esophageal reflux disease without esophagitis: Secondary | ICD-10-CM | POA: Diagnosis not present

## 2015-03-17 DIAGNOSIS — M1990 Unspecified osteoarthritis, unspecified site: Secondary | ICD-10-CM | POA: Diagnosis not present

## 2015-03-17 DIAGNOSIS — I509 Heart failure, unspecified: Secondary | ICD-10-CM | POA: Insufficient documentation

## 2015-03-17 DIAGNOSIS — I2510 Atherosclerotic heart disease of native coronary artery without angina pectoris: Secondary | ICD-10-CM | POA: Insufficient documentation

## 2015-03-17 DIAGNOSIS — I4892 Unspecified atrial flutter: Secondary | ICD-10-CM | POA: Diagnosis present

## 2015-03-17 DIAGNOSIS — I480 Paroxysmal atrial fibrillation: Secondary | ICD-10-CM | POA: Diagnosis present

## 2015-03-17 DIAGNOSIS — E669 Obesity, unspecified: Secondary | ICD-10-CM | POA: Diagnosis not present

## 2015-03-17 DIAGNOSIS — I129 Hypertensive chronic kidney disease with stage 1 through stage 4 chronic kidney disease, or unspecified chronic kidney disease: Secondary | ICD-10-CM | POA: Diagnosis not present

## 2015-03-17 DIAGNOSIS — Z880 Allergy status to penicillin: Secondary | ICD-10-CM | POA: Insufficient documentation

## 2015-03-17 HISTORY — PX: CARDIOVERSION: SHX1299

## 2015-03-17 LAB — GLUCOSE, CAPILLARY: Glucose-Capillary: 107 mg/dL — ABNORMAL HIGH (ref 70–99)

## 2015-03-17 SURGERY — CARDIOVERSION
Anesthesia: Monitor Anesthesia Care

## 2015-03-17 MED ORDER — SODIUM CHLORIDE 0.9 % IV SOLN
INTRAVENOUS | Status: DC | PRN
  Administered 2015-03-17: 10:00:00 via INTRAVENOUS

## 2015-03-17 MED ORDER — SODIUM CHLORIDE 0.9 % IV SOLN
INTRAVENOUS | Status: DC
  Administered 2015-03-17: 09:00:00 via INTRAVENOUS

## 2015-03-17 MED ORDER — PROPOFOL 10 MG/ML IV BOLUS
INTRAVENOUS | Status: DC | PRN
  Administered 2015-03-17: 90 mg via INTRAVENOUS

## 2015-03-17 MED ORDER — LIDOCAINE HCL (CARDIAC) 20 MG/ML IV SOLN
INTRAVENOUS | Status: DC | PRN
  Administered 2015-03-17: 40 mg via INTRAVENOUS

## 2015-03-17 NOTE — Anesthesia Preprocedure Evaluation (Addendum)
Anesthesia Evaluation  Patient identified by MRN, date of birth, ID band Patient awake    Reviewed: Allergy & Precautions, NPO status , Patient's Chart, lab work & pertinent test results  History of Anesthesia Complications Negative for: history of anesthetic complications  Airway Mallampati: II  TM Distance: >3 FB Neck ROM: Full    Dental  (+) Upper Dentures, Lower Dentures   Pulmonary shortness of breath, Current Smoker,  breath sounds clear to auscultation        Cardiovascular hypertension, + CAD, + Past MI, + Peripheral Vascular Disease and +CHF Rhythm:Irregular     Neuro/Psych  Neuromuscular disease    GI/Hepatic GERD-  ,  Endo/Other  diabetes  Renal/GU Renal disease     Musculoskeletal  (+) Arthritis -,   Abdominal   Peds  Hematology   Anesthesia Other Findings   Reproductive/Obstetrics                            Anesthesia Physical Anesthesia Plan  ASA: III  Anesthesia Plan: MAC   Post-op Pain Management:    Induction:   Airway Management Planned: Mask  Additional Equipment:   Intra-op Plan:   Post-operative Plan:   Informed Consent: I have reviewed the patients History and Physical, chart, labs and discussed the procedure including the risks, benefits and alternatives for the proposed anesthesia with the patient or authorized representative who has indicated his/her understanding and acceptance.   Dental advisory given  Plan Discussed with: Anesthesiologist and CRNA  Anesthesia Plan Comments:         Anesthesia Quick Evaluation

## 2015-03-17 NOTE — Addendum Note (Signed)
Addendum  created 03/17/15 1119 by Laurie Panda, MD   Modules edited: Anesthesia Review and Sign Navigator Section, Clinical Notes   Clinical Notes:  File: [id number]

## 2015-03-17 NOTE — Discharge Instructions (Signed)

## 2015-03-17 NOTE — CV Procedure (Signed)
   Electrical Cardioversion Procedure Note Kathleen [name] [id number] 03-10-1946  Procedure: Electrical Cardioversion Indications:  Atrial flutter  Time Out: Verified patient identification, verified procedure,medications/allergies/relevent history reviewed, required imaging and test results available.  Performed  Procedure Details  The patient was NPO after midnight. Anesthesia was administered at the beside  by Dr.Moser with 90mg  of propofol and 40mg  of Lidocaine.  Cardioversion was done with synchronized biphasic defibrillation with AP pads with 150watts.  The patient converted to normal sinus rhythm. The patient tolerated the procedure well   IMPRESSION:  Successful cardioversion of atrial flutter    Kathleen [name] R 03/17/2015, 9:29 AM

## 2015-03-17 NOTE — Transfer of Care (Signed)
Immediate Anesthesia Transfer of Care Note  Patient: Kathleen [name]  Procedure(s) Performed: Procedure(s): CARDIOVERSION (N/A)  Patient Location: Endoscopy Unit  Anesthesia Type:MAC  Level of Consciousness: sedated  Airway & Oxygen Therapy: Patient Spontanous Breathing and Patient connected to nasal cannula oxygen  Post-op Assessment: Report given to RN, Post -op Vital signs reviewed and stable and Patient moving all extremities  Post vital signs: Reviewed and stable  Last Vitals:  Filed Vitals:   03/17/15 0949  BP: 97/42  Pulse: 57  Temp:   Resp: 20    Complications: No apparent anesthesia complications

## 2015-03-17 NOTE — Anesthesia Postprocedure Evaluation (Signed)
  Anesthesia Post-op Note  Patient: Kathleen [name]  Procedure(s) Performed: Procedure(s): CARDIOVERSION (N/A)  Patient Location: Endoscopy Unit  Anesthesia Type:MAC  Level of Consciousness: awake  Airway and Oxygen Therapy: Patient Spontanous Breathing  Post-op Pain: none  Post-op Assessment: Post-op Vital signs reviewed, Patient's Cardiovascular Status Stable, Respiratory Function Stable, Patent Airway, No signs of Nausea or vomiting and Pain level controlled  Post-op Vital Signs: Reviewed and stable  Last Vitals:  Filed Vitals:   03/17/15 1026  BP: 133/56  Pulse: 58  Temp:   Resp: 16    Complications: No apparent anesthesia complications

## 2015-03-17 NOTE — H&P (View-Only) (Signed)
HPI The patient presents for evaluation of atrial fib. She was in the hospital with a probable exacerbation of COPD. She did have some minimally elevated enzymes. She had a negative stress perfusion study. I do note that she had atrial fibrillation n the hospital. This was treated with rate control. She was started on Eliquis. She had normal EF with some diastolic dysfunction on echo. Her BNP was mildly elevated greater than 600.  When I saw her in the office she was in atrial flutter. She wore a monitor which demonstrated persistent flutter with good rate control. She returns for follow-up. She does say she is having some increasing dyspnea with activities. She still gets short of breath mostly at night about an hour after she goes to sleep. She's had some mildly increased edema. She says she watches her salt her feet elevated.  Allergies  Allergen Reactions  . Penicillins Other (See Comments)    unknown    Current Outpatient Prescriptions  Medication Sig Dispense Refill  . albuterol (PROVENTIL HFA;VENTOLIN HFA) 108 (90 BASE) MCG/ACT inhaler Inhale 1-2 puffs into the lungs every 6 (six) hours as needed for wheezing or shortness of breath. 1 Inhaler 0  . allopurinol (ZYLOPRIM) 100 MG tablet Take 100 mg by mouth daily.    Marland Kitchen apixaban (ELIQUIS) 5 MG TABS tablet Take 1 tablet (5 mg total) by mouth 2 (two) times daily. 60 tablet 0  . colchicine 0.6 MG tablet Take 0.6 mg by mouth daily as needed (for gout flare ups).     . famotidine (PEPCID) 20 MG tablet Take 20 mg by mouth at bedtime.     . furosemide (LASIX) 20 MG tablet Take 1 tablet (20 mg total) by mouth daily. 90 tablet 3  . glipiZIDE (GLUCOTROL) 5 MG tablet Take 1 tablet (5 mg total) by mouth 2 (two) times daily before a meal. 60 tablet 1  . irbesartan (AVAPRO) 75 MG tablet Take 1 tablet (75 mg total) by mouth daily. 30 tablet 0  . isosorbide-hydrALAZINE (BIDIL) 20-37.5 MG per tablet Take 1 tablet by mouth 3 (three) times daily. 90 tablet 1    . metoprolol tartrate (LOPRESSOR) 25 MG tablet Take 3 tablets (75 mg total) by mouth 2 (two) times daily. 180 tablet 5  . pravastatin (PRAVACHOL) 80 MG tablet Take 1 tablet (80 mg total) by mouth daily. 30 tablet 6  . pravastatin (PRAVACHOL) 80 MG tablet Take 80 mg by mouth daily.    Marland Kitchen telmisartan (MICARDIS) 40 MG tablet Take 1 tablet by mouth daily.    Marland Kitchen terbinafine (LAMISIL) 250 MG tablet Take 250 mg by mouth daily.  0   No current facility-administered medications for this visit.    Past Medical History  Diagnosis Date  . Hypertension   . Coronary artery disease     Cypher stent to the RCA in 2003  . Blood transfusion     no side affects  . GERD (gastroesophageal reflux disease)   . Arthritis   . Diabetes mellitus     Borderline  . CHF (congestive heart failure)   . Shortness of breath     Past Surgical History  Procedure Laterality Date  . Total knee arthroplasty    . Cholecystectomy    . Coronary angioplasty with stent placement      ROS:  URI symptoms.  Otherwise as stated in the HPI and negative for all other systems.  PHYSICAL EXAM BP 128/62 mmHg  Pulse 72  Ht 5\' 4"  (1.626  m)  Wt 267 lb 14.4 oz (121.519 kg)  BMI 45.96 kg/m2 GENERAL:  Well appearing HEENT:  Pupils equal round and reactive, fundi not visualized, oral mucosa unremarkable, dentures NECK:  No jugular venous distention, waveform within normal limits, carotid upstroke brisk and symmetric, right bruit, no thyromegaly LUNGS:  Clear to auscultation bilaterally BACK:  No CVA tenderness CHEST:  Unremarkable HEART:  PMI not displaced or sustained,S1 and S2 within normal limits, no S3, no clicks, no rubs, no murmurs, irregular ABD:  Flat, positive bowel sounds normal in frequency in pitch, no bruits, no rebound, no guarding, no midline pulsatile mass, no hepatomegaly, no splenomegaly, obese EXT:  2 plus pulses throughout, mild bilateral leg edema, no cyanosis no clubbing   ASSESSMENT AND PLAN  ATRIAL  FIB She doesn't know that she is in fibrillation/flutter.  However, she has some increased edema and shortness of breath which could be related. She is tolerating anticoagulation. I'm going to set her up for cardioversion. We discussed the risks benefits of this.  C A D -  The patient has no new sypmtoms.  With no ischemia on stress testing no further workup is suggested.  HYPERTENSION, UNSPECIFIED -  The blood pressure is at target. No change in medications is indicated. We will continue with therapeutic lifestyle changes (TLC).  Of note we clarified and she is not taking Micardis.    OBESITY -  The patient understands the need to lose weight with diet and exercise and she has lost some weight    Carotid stenosis - She at recent very mild plaque. We will follow this periodically.  Edema - The patient does have some mild edema and dyspnea.  I suspect she might have some acute on chronic diastolic dysfunction. We talked about salt restriction. I am going to increase her Lasix to 40 mg daily. She's going to see the nephrologist in a few days and I instructed her to get a basic metabolic profile.

## 2015-03-17 NOTE — Telephone Encounter (Signed)
°  1. Which medications need to be refilled? Eliquis   2. Which pharmacy is medication to be sent to?[facility] on [address]  3. Do they need a 30 day or 90 day supply? 30  4. Would they like a call back once the medication has been sent to the pharmacy? yes

## 2015-03-17 NOTE — Interval H&P Note (Signed)
History and Physical Interval Note:  03/17/2015 9:30 AM  Kathleen [name]  has presented today for surgery, with the diagnosis of AFIB  The various methods of treatment have been discussed with the patient and family. After consideration of risks, benefits and other options for treatment, the patient has consented to  Procedure(s): CARDIOVERSION (N/A) as a surgical intervention .  The patient's history has been reviewed, patient examined, no change in status, stable for surgery.  I have reviewed the patient's chart and labs.  Questions were answered to the patient's satisfaction.     TURNER,TRACI R

## 2015-03-18 ENCOUNTER — Encounter (HOSPITAL_COMMUNITY): Payer: Self-pay | Admitting: Cardiology

## 2015-03-18 MED ORDER — APIXABAN 5 MG PO TABS: 5.0000 mg | ORAL_TABLET | Freq: Two times a day (BID) | ORAL | Status: DC

## 2015-03-18 NOTE — Telephone Encounter (Signed)
Rx(s) sent to pharmacy electronically.  

## 2015-03-19 ENCOUNTER — Telehealth: Payer: Self-pay | Admitting: *Deleted

## 2015-03-19 NOTE — Telephone Encounter (Signed)
P.A for Eliquis sent to plan via Cover My Meds.

## 2015-03-20 NOTE — Telephone Encounter (Signed)
Fax received from [employer] stating approval for patient's Eliquis through 03/17/2016

## 2015-03-31 ENCOUNTER — Encounter: Payer: Self-pay | Admitting: Cardiology

## 2015-03-31 ENCOUNTER — Ambulatory Visit (INDEPENDENT_AMBULATORY_CARE_PROVIDER_SITE_OTHER): Payer: Medicare Other | Admitting: Cardiology

## 2015-03-31 VITALS — BP 112/72 | HR 73 | Ht 64.0 in | Wt 267.0 lb

## 2015-03-31 DIAGNOSIS — I483 Typical atrial flutter: Secondary | ICD-10-CM | POA: Diagnosis not present

## 2015-03-31 DIAGNOSIS — I5032 Chronic diastolic (congestive) heart failure: Secondary | ICD-10-CM | POA: Diagnosis not present

## 2015-03-31 NOTE — Progress Notes (Signed)
HPI The patient presents for evaluation of atrial fib. She was in the hospital with a probable exacerbation of COPD. She did have some minimally elevated enzymes. She had a negative stress perfusion study. I do note that she had atrial fibrillation in the hospital. This was treated with rate control. She was started on Eliquis. She had normal EF with some diastolic dysfunction on echo. Her BNP was mildly elevated greater than 600.  When I saw her in the office she was in atrial flutter. She wore a monitor which demonstrated persistent flutter with good rate control.     She is now status post cardioversion. She is in sinus rhythm but she really still has shortness of breath.  She denies any chest pressure, neck or arm discomfort. She doesn't notice palpitations.  She does get dyspnea with exertion but this is unchanged. She's not having PND or orthopnea. She's had no presyncope or syncope. There's been no weight gain or edema. She's had no problems taking her anticoagulation.   Allergies  Allergen Reactions  . Penicillins Other (See Comments)    unknown    Current Outpatient Prescriptions  Medication Sig Dispense Refill  . albuterol (PROVENTIL HFA;VENTOLIN HFA) 108 (90 BASE) MCG/ACT inhaler Inhale 1-2 puffs into the lungs every 6 (six) hours as needed for wheezing or shortness of breath. 1 Inhaler 0  . allopurinol (ZYLOPRIM) 100 MG tablet Take 100 mg by mouth daily.    Marland Kitchen apixaban (ELIQUIS) 5 MG TABS tablet Take 1 tablet (5 mg total) by mouth 2 (two) times daily. 60 tablet 3  . colchicine 0.6 MG tablet Take 0.6 mg by mouth daily as needed (for gout flare ups).     . famotidine (PEPCID) 20 MG tablet Take 20 mg by mouth at bedtime.     . furosemide (LASIX) 80 MG tablet Take 80 mg by mouth daily.  0  . glipiZIDE (GLUCOTROL) 5 MG tablet Take 1 tablet (5 mg total) by mouth 2 (two) times daily before a meal. 60 tablet 1  . irbesartan (AVAPRO) 75 MG tablet Take 1 tablet (75 mg total) by mouth daily.  30 tablet 6  . isosorbide-hydrALAZINE (BIDIL) 20-37.5 MG per tablet Take 1 tablet by mouth 3 (three) times daily. 90 tablet 9  . metoprolol tartrate (LOPRESSOR) 25 MG tablet Take 3 tablets (75 mg total) by mouth 2 (two) times daily. 180 tablet 5  . pravastatin (PRAVACHOL) 80 MG tablet Take 1 tablet (80 mg total) by mouth daily. 30 tablet 6  . terbinafine (LAMISIL) 250 MG tablet Take 250 mg by mouth daily.  0   No current facility-administered medications for this visit.    Past Medical History  Diagnosis Date  . Hypertension   . Coronary artery disease     Cypher stent to the RCA in 2003  . Blood transfusion     no side affects  . GERD (gastroesophageal reflux disease)   . Arthritis   . Diabetes mellitus     Borderline  . CHF (congestive heart failure)   . Shortness of breath     Past Surgical History  Procedure Laterality Date  . Total knee arthroplasty    . Cholecystectomy    . Coronary angioplasty with stent placement    . Cardioversion N/A 03/17/2015    Procedure: CARDIOVERSION;  Surgeon: Sueanne Margarita, MD;  Location: [facility];  Service: Cardiovascular;  Laterality: N/A;    ROS: As stated in the HPI and negative for all other systems.  PHYSICAL EXAM BP 112/72 mmHg  Pulse 73  Ht 5\' 4"  (1.626 m)  Wt 267 lb (121.11 kg)  BMI 45.81 kg/m2 GENERAL:  Well appearing HEENT:  Pupils equal round and reactive, fundi not visualized, oral mucosa unremarkable, dentures NECK:  No jugular venous distention, waveform within normal limits, carotid upstroke brisk and symmetric, right bruit, no thyromegaly LUNGS:  Clear to auscultation bilaterally BACK:  No CVA tenderness CHEST:  Unremarkable HEART:  PMI not displaced or sustained,S1 and S2 within normal limits, no S3, no S4, no clicks, no rubs, no murmurs ABD:  Flat, positive bowel sounds normal in frequency in pitch, no bruits, no rebound, no guarding, no midline pulsatile mass, no hepatomegaly, no splenomegaly, obese EXT:  2 plus  pulses throughout, mild bilateral leg edema, no cyanosis no clubbing  EKG:  Sinus rhythm with premature atrial contractions, or anterior R wave progression, QTC slightly prolonged, no acute ST-T wave changes.  03/31/2015  ASSESSMENT AND PLAN  ATRIAL FIB/FLUTTER She is in sinus rhythm without any difference in her symptoms. If she goes back in the fibrillation or flutter in the usual we will plan for rate control and anticoagulation.  C A D -  The patient has no new sypmtoms.  With no ischemia on stress testing no further workup is suggested.  HYPERTENSION, UNSPECIFIED -  The blood pressure is at target. No change in medications is indicated. We will continue with therapeutic lifestyle changes (TLC).    OBESITY -  The patient understands the need to lose weight with diet and exercise and she has lost some weight  We talked about this again today.   Carotid stenosis - She at recent very mild plaque. We will follow this periodically.  DYSPNEA - I think this is multifactorial. No change in therapy is indicated. If she doesn't improve with weight loss and exercise then we will reinvestigate.

## 2015-03-31 NOTE — Patient Instructions (Signed)
Dr.Honchrein wants you to follow-up in: 6 months. You will receive a reminder letter in the mail two months in advance. If you don't receive a letter, please call our office to schedule the follow-up appointment.

## 2015-04-03 ENCOUNTER — Ambulatory Visit: Payer: Medicare Other | Admitting: Cardiology

## 2015-06-28 DIAGNOSIS — J449 Chronic obstructive pulmonary disease, unspecified: Secondary | ICD-10-CM | POA: Diagnosis not present

## 2015-07-14 DIAGNOSIS — E784 Other hyperlipidemia: Secondary | ICD-10-CM | POA: Diagnosis not present

## 2015-07-14 DIAGNOSIS — I5032 Chronic diastolic (congestive) heart failure: Secondary | ICD-10-CM | POA: Diagnosis not present

## 2015-07-14 DIAGNOSIS — I10 Essential (primary) hypertension: Secondary | ICD-10-CM | POA: Diagnosis not present

## 2015-07-14 DIAGNOSIS — E1159 Type 2 diabetes mellitus with other circulatory complications: Secondary | ICD-10-CM | POA: Diagnosis not present

## 2015-07-14 DIAGNOSIS — F17200 Nicotine dependence, unspecified, uncomplicated: Secondary | ICD-10-CM | POA: Diagnosis not present

## 2015-07-14 DIAGNOSIS — J449 Chronic obstructive pulmonary disease, unspecified: Secondary | ICD-10-CM | POA: Diagnosis not present

## 2015-07-29 DIAGNOSIS — J449 Chronic obstructive pulmonary disease, unspecified: Secondary | ICD-10-CM | POA: Diagnosis not present

## 2015-08-15 ENCOUNTER — Emergency Department (HOSPITAL_COMMUNITY): Payer: Medicare Other

## 2015-08-15 ENCOUNTER — Encounter (HOSPITAL_COMMUNITY): Payer: Self-pay | Admitting: Family Medicine

## 2015-08-15 ENCOUNTER — Other Ambulatory Visit: Payer: Self-pay | Admitting: Cardiology

## 2015-08-15 ENCOUNTER — Emergency Department (HOSPITAL_COMMUNITY)
Admission: EM | Admit: 2015-08-15 | Discharge: 2015-08-15 | Disposition: A | Discharge status: Home / Self Care | Payer: Medicare Other | Attending: Emergency Medicine | Admitting: Emergency Medicine

## 2015-08-15 DIAGNOSIS — Z8739 Personal history of other diseases of the musculoskeletal system and connective tissue: Secondary | ICD-10-CM | POA: Insufficient documentation

## 2015-08-15 DIAGNOSIS — Z9861 Coronary angioplasty status: Secondary | ICD-10-CM | POA: Insufficient documentation

## 2015-08-15 DIAGNOSIS — R109 Unspecified abdominal pain: Secondary | ICD-10-CM

## 2015-08-15 DIAGNOSIS — I2510 Atherosclerotic heart disease of native coronary artery without angina pectoris: Secondary | ICD-10-CM | POA: Diagnosis not present

## 2015-08-15 DIAGNOSIS — Z87442 Personal history of urinary calculi: Secondary | ICD-10-CM | POA: Diagnosis not present

## 2015-08-15 DIAGNOSIS — I10 Essential (primary) hypertension: Secondary | ICD-10-CM | POA: Insufficient documentation

## 2015-08-15 DIAGNOSIS — I509 Heart failure, unspecified: Secondary | ICD-10-CM | POA: Insufficient documentation

## 2015-08-15 DIAGNOSIS — Z79899 Other long term (current) drug therapy: Secondary | ICD-10-CM | POA: Insufficient documentation

## 2015-08-15 DIAGNOSIS — Z8719 Personal history of other diseases of the digestive system: Secondary | ICD-10-CM | POA: Diagnosis not present

## 2015-08-15 DIAGNOSIS — Z7901 Long term (current) use of anticoagulants: Secondary | ICD-10-CM | POA: Diagnosis not present

## 2015-08-15 DIAGNOSIS — Z720 Tobacco use: Secondary | ICD-10-CM | POA: Insufficient documentation

## 2015-08-15 DIAGNOSIS — R1031 Right lower quadrant pain: Secondary | ICD-10-CM | POA: Diagnosis not present

## 2015-08-15 DIAGNOSIS — R079 Chest pain, unspecified: Secondary | ICD-10-CM | POA: Diagnosis not present

## 2015-08-15 DIAGNOSIS — Z880 Allergy status to penicillin: Secondary | ICD-10-CM | POA: Insufficient documentation

## 2015-08-15 DIAGNOSIS — E119 Type 2 diabetes mellitus without complications: Secondary | ICD-10-CM | POA: Insufficient documentation

## 2015-08-15 LAB — I-STAT CHEM 8, ED
BUN: 57 mg/dL — ABNORMAL HIGH (ref 6–20)
Calcium, Ion: 1.18 mmol/L (ref 1.13–1.30)
Chloride: 101 mmol/L (ref 101–111)
Creatinine, Ser: 2.3 mg/dL — ABNORMAL HIGH (ref 0.44–1.00)
GLUCOSE: 88 mg/dL (ref 65–99)
HCT: 42 % (ref 36.0–46.0)
HEMOGLOBIN: 14.3 g/dL (ref 12.0–15.0)
POTASSIUM: 3.9 mmol/L (ref 3.5–5.1)
Sodium: 142 mmol/L (ref 135–145)
TCO2: 27 mmol/L (ref 0–100)

## 2015-08-15 LAB — URINALYSIS, ROUTINE W REFLEX MICROSCOPIC
BILIRUBIN URINE: NEGATIVE
GLUCOSE, UA: NEGATIVE mg/dL
HGB URINE DIPSTICK: NEGATIVE
Ketones, ur: NEGATIVE mg/dL
Leukocytes, UA: NEGATIVE
Nitrite: NEGATIVE
PH: 6 (ref 5.0–8.0)
Protein, ur: 100 mg/dL — AB
SPECIFIC GRAVITY, URINE: 1.016 (ref 1.005–1.030)
UROBILINOGEN UA: 1 mg/dL (ref 0.0–1.0)

## 2015-08-15 LAB — CBC
HCT: 37.9 % (ref 36.0–46.0)
Hemoglobin: 12.5 g/dL (ref 12.0–15.0)
MCH: 29.6 pg (ref 26.0–34.0)
MCHC: 33 g/dL (ref 30.0–36.0)
MCV: 89.6 fL (ref 78.0–100.0)
PLATELETS: 157 10*3/uL (ref 150–400)
RBC: 4.23 MIL/uL (ref 3.87–5.11)
RDW: 16.6 % — AB (ref 11.5–15.5)
WBC: 10 10*3/uL (ref 4.0–10.5)

## 2015-08-15 LAB — PROTIME-INR
INR: 1.21 (ref 0.00–1.49)
PROTHROMBIN TIME: 15.5 s — AB (ref 11.6–15.2)

## 2015-08-15 LAB — URINE MICROSCOPIC-ADD ON

## 2015-08-15 MED ORDER — NAPROXEN 500 MG PO TABS: 500.0000 mg | ORAL_TABLET | Freq: Two times a day (BID) | ORAL | Status: DC

## 2015-08-15 MED ORDER — FENTANYL CITRATE (PF) 100 MCG/2ML IJ SOLN
50.0000 ug | Freq: Once | INTRAMUSCULAR | Status: AC
  Administered 2015-08-15: 50 ug via INTRAVENOUS
  Filled 2015-08-15: qty 2

## 2015-08-15 MED ORDER — CYCLOBENZAPRINE HCL 10 MG PO TABS: 10.0000 mg | ORAL_TABLET | Freq: Two times a day (BID) | ORAL | Status: DC | PRN

## 2015-08-15 NOTE — ED Notes (Signed)
Pt here with 4 days of right rib/flank pain. Hurts with movement and breathing.

## 2015-08-15 NOTE — Telephone Encounter (Signed)
Rx has been sent to the pharmacy electronically. ° °

## 2015-08-15 NOTE — ED Provider Notes (Signed)
CSN: [id number]     Arrival date & time 08/15/15  0706 History   First MD Initiated Contact with Patient 08/15/15 0710     Chief Complaint  Patient presents with  . Flank Pain     (Consider location/radiation/quality/duration/timing/severity/associated sxs/prior Treatment) HPI  The pt is a 69 y/o femal - she is currently on eliquis for her hx of heart disease - she has been having pain in the R flank for the last 4 days, is made worse with movement / and worse with change in position - has been relatively constant and persistent - no associated urinary c/o and no diarrhea / constipation / f/c/n/v/d.  She has tylenol prior to arrival wtihout improvement - she has hx of kidney stones in the past.    Past Medical History  Diagnosis Date  . Hypertension   . Coronary artery disease     Cypher stent to the RCA in 2003  . Blood transfusion     no side affects  . GERD (gastroesophageal reflux disease)   . Arthritis   . Diabetes mellitus     Borderline  . CHF (congestive heart failure)   . Shortness of breath    Past Surgical History  Procedure Laterality Date  . Total knee arthroplasty    . Cholecystectomy    . Coronary angioplasty with stent placement    . Cardioversion N/A 03/17/2015    Procedure: CARDIOVERSION;  Surgeon: Sueanne Margarita, MD;  [facility];  Service: Cardiovascular;  Laterality: N/A;   Family History  Problem Relation Age of Onset  . Hypertension Father   . Heart disease Father   . Gout Father   . Arthritis Father   . Heart attack Brother   . Heart attack Father   . Stroke Neg Hx   . Hypertension Brother   . Hypertension Sister    Social History  Substance Use Topics  . Smoking status: Current Every Day Smoker -- 0.30 packs/day for 30 years    Types: Cigarettes    Last Attempt to Quit: 01/18/2014  . Smokeless tobacco: Never Used     Comment: Smoking cessation requested   . Alcohol Use: No   OB History    Gravida Para Term Preterm AB TAB SAB  Ectopic Multiple Living   9 7 5 2 2  0 2 0 0 5     Review of Systems  All other systems reviewed and are negative.   Allergies  Penicillins  Home Medications   Prior to Admission medications   Medication Sig Start Date End Date Taking? Authorizing Provider  albuterol (PROVENTIL HFA;VENTOLIN HFA) 108 (90 BASE) MCG/ACT inhaler Inhale 1-2 puffs into the lungs every 6 (six) hours as needed for wheezing or shortness of breath. 10/16/13   Janne Napoleon, NP  allopurinol (ZYLOPRIM) 100 MG tablet Take 100 mg by mouth daily.    Historical Provider, MD  apixaban (ELIQUIS) 5 MG TABS tablet Take 1 tablet (5 mg total) by mouth 2 (two) times daily. 03/18/15   Minus Breeding, MD  colchicine 0.6 MG tablet Take 0.6 mg by mouth daily as needed (for gout flare ups).     Historical Provider, MD  famotidine (PEPCID) 20 MG tablet Take 20 mg by mouth at bedtime.     Historical Provider, MD  furosemide (LASIX) 80 MG tablet Take 80 mg by mouth daily. 03/14/15   Historical Provider, MD  glipiZIDE (GLUCOTROL) 5 MG tablet Take 1 tablet (5 mg total) by mouth 2 (two)  times daily before a meal. 06/19/14   Billy Fischer, MD  irbesartan (AVAPRO) 75 MG tablet Take 1 tablet (75 mg total) by mouth daily. 03/11/15   Minus Breeding, MD  isosorbide-hydrALAZINE (BIDIL) 20-37.5 MG per tablet Take 1 tablet by mouth 3 (three) times daily. 03/10/15   Minus Breeding, MD  metoprolol tartrate (LOPRESSOR) 25 MG tablet Take 3 tablets (75 mg total) by mouth 2 (two) times daily. 02/18/15   Minus Breeding, MD  pravastatin (PRAVACHOL) 80 MG tablet Take 1 tablet (80 mg total) by mouth daily. 11/23/11 03/31/15  Minus Breeding, MD  terbinafine (LAMISIL) 250 MG tablet Take 250 mg by mouth daily. 12/30/14   Historical Provider, MD   BP 139/114 mmHg  Pulse 65  Temp(Src) 98.2 F (36.8 C)  Resp 18  SpO2 100% Physical Exam  Constitutional: She appears well-developed and well-nourished. No distress.  HENT:  Head: Normocephalic and atraumatic.  Mouth/Throat:  Oropharynx is clear and moist. No oropharyngeal exudate.  Eyes: Conjunctivae and EOM are normal. Pupils are equal, round, and reactive to light. Right eye exhibits no discharge. Left eye exhibits no discharge. No scleral icterus.  Neck: Normal range of motion. Neck supple. No JVD present. No thyromegaly present.  Cardiovascular: Normal rate, regular rhythm, normal heart sounds and intact distal pulses.  Exam reveals no gallop and no friction rub.   No murmur heard. Pulmonary/Chest: Effort normal and breath sounds normal. No respiratory distress. She has no wheezes. She has no rales. She exhibits tenderness ( R posterior and lateral rib ttp without rashes / bruising or deformity).  Abdominal: Soft. Bowel sounds are normal. She exhibits no distension and no mass. There is no tenderness.  No abdominal ttp  Musculoskeletal: Normal range of motion. She exhibits no edema or tenderness.  Lymphadenopathy:    She has no cervical adenopathy.  Neurological: She is alert. Coordination normal.  Skin: Skin is warm and dry. No rash noted. No erythema.  Psychiatric: She has a normal mood and affect. Her behavior is normal.  Nursing note and vitals reviewed.   ED Course  Procedures (including critical care time) Labs Review Labs Reviewed  CBC  PROTIME-INR  URINALYSIS, ROUTINE W REFLEX MICROSCOPIC (NOT AT [facility])  I-STAT CHEM 8, ED    Imaging Review No results found. I have personally reviewed and evaluated these images and lab results as part of my medical decision-making.   EKG Interpretation None      MDM   Final diagnoses:  None    Etiology likely MSK however with sig flank pain on the R will need to have CT to r/o kidney stones and to evaluate for possible bleeding causing pain - pt in agreement - pain meds ordered.  Meds given in ED:  Medications  fentaNYL (SUBLIMAZE) injection 50 mcg (not administered)    New Prescriptions   No medications on file        Noemi Chapel,  MD 08/15/15 1022

## 2015-08-15 NOTE — Discharge Instructions (Signed)

## 2015-08-21 DIAGNOSIS — E1151 Type 2 diabetes mellitus with diabetic peripheral angiopathy without gangrene: Secondary | ICD-10-CM | POA: Diagnosis not present

## 2015-08-21 DIAGNOSIS — L603 Nail dystrophy: Secondary | ICD-10-CM | POA: Diagnosis not present

## 2015-08-21 DIAGNOSIS — L84 Corns and callosities: Secondary | ICD-10-CM | POA: Diagnosis not present

## 2015-08-21 DIAGNOSIS — I739 Peripheral vascular disease, unspecified: Secondary | ICD-10-CM | POA: Diagnosis not present

## 2015-08-29 DIAGNOSIS — J449 Chronic obstructive pulmonary disease, unspecified: Secondary | ICD-10-CM | POA: Diagnosis not present

## 2015-09-28 DIAGNOSIS — J449 Chronic obstructive pulmonary disease, unspecified: Secondary | ICD-10-CM | POA: Diagnosis not present

## 2015-09-29 ENCOUNTER — Encounter: Payer: Self-pay | Admitting: Cardiology

## 2015-09-29 ENCOUNTER — Ambulatory Visit (INDEPENDENT_AMBULATORY_CARE_PROVIDER_SITE_OTHER): Payer: Medicare Other | Admitting: Cardiology

## 2015-09-29 VITALS — BP 126/56 | HR 78 | Ht 64.0 in | Wt 264.7 lb

## 2015-09-29 DIAGNOSIS — I483 Typical atrial flutter: Secondary | ICD-10-CM | POA: Diagnosis not present

## 2015-09-29 NOTE — Progress Notes (Signed)
HPI The patient presents for evaluation of atrial fib. Since I last saw her she has had no new problems. She does a lot of traveling to visit family. She gets around with a cane. She is tolerating anticoagulation. She's had no new shortness of breath, PND or orthopnea. She doesn't feel any palpitations, presyncope or syncope. She's had no chest pressure, neck or arm discomfort.   Allergies  Allergen Reactions  . Penicillins Other (See Comments)    unknown    Current Outpatient Prescriptions  Medication Sig Dispense Refill  . acetaminophen (TYLENOL) 325 MG tablet Take 975 mg by mouth every 6 (six) hours as needed (pain).    Marland Kitchen albuterol (PROVENTIL HFA;VENTOLIN HFA) 108 (90 BASE) MCG/ACT inhaler Inhale 1-2 puffs into the lungs every 6 (six) hours as needed for wheezing or shortness of breath. 1 Inhaler 0  . allopurinol (ZYLOPRIM) 100 MG tablet Take 100 mg by mouth daily.    Marland Kitchen apixaban (ELIQUIS) 5 MG TABS tablet Take 1 tablet (5 mg total) by mouth 2 (two) times daily. 60 tablet 3  . colchicine 0.6 MG tablet Take 0.6 mg by mouth daily as needed (for gout flare ups).     . cyclobenzaprine (FLEXERIL) 10 MG tablet Take 1 tablet (10 mg total) by mouth 2 (two) times daily as needed for muscle spasms. 20 tablet 0  . famotidine (PEPCID) 20 MG tablet Take 20 mg by mouth at bedtime.     . furosemide (LASIX) 80 MG tablet Take 80 mg by mouth daily.  0  . glipiZIDE (GLUCOTROL) 5 MG tablet Take 1 tablet (5 mg total) by mouth 2 (two) times daily before a meal. 60 tablet 1  . irbesartan (AVAPRO) 75 MG tablet Take 1 tablet (75 mg total) by mouth daily. 30 tablet 6  . isosorbide-hydrALAZINE (BIDIL) 20-37.5 MG per tablet Take 1 tablet by mouth 3 (three) times daily. 90 tablet 9  . metoprolol tartrate (LOPRESSOR) 25 MG tablet take 3 tablets by mouth twice a day 180 tablet 5  . naproxen (NAPROSYN) 500 MG tablet Take 1 tablet (500 mg total) by mouth 2 (two) times daily with a meal. 15 tablet 0  . pravastatin  (PRAVACHOL) 80 MG tablet Take 1 tablet (80 mg total) by mouth daily. 30 tablet 6   No current facility-administered medications for this visit.    Past Medical History  Diagnosis Date  . Hypertension   . Coronary artery disease     Cypher stent to the RCA in 2003  . Blood transfusion     no side affects  . GERD (gastroesophageal reflux disease)   . Arthritis   . Diabetes mellitus     Borderline  . CHF (congestive heart failure) ([place])   . Shortness of breath     Past Surgical History  Procedure Laterality Date  . Total knee arthroplasty    . Cholecystectomy    . Coronary angioplasty with stent placement    . Cardioversion N/A 03/17/2015    Procedure: CARDIOVERSION;  Surgeon: Sueanne Margarita, MD;  [facility];  Service: Cardiovascular;  Laterality: N/A;    ROS: As stated in the HPI and negative for all other systems.  PHYSICAL EXAM BP 126/56 mmHg  Pulse 78  Ht 5\' 4"  (1.626 m)  Wt 264 lb 11.2 oz (120.067 kg)  BMI 45.41 kg/m2 GENERAL:  Well appearing HEENT:  Pupils equal round and reactive, fundi not visualized, oral mucosa unremarkable, dentures NECK:  No jugular venous distention, waveform  within normal limits, carotid upstroke brisk and symmetric, right bruit, no thyromegaly LUNGS:  Clear to auscultation bilaterally BACK:  No CVA tenderness CHEST:  Unremarkable HEART:  PMI not displaced or sustained,S1 and S2 within normal limits, no S3, no clicks, no rubs, no murmurs, irregular ABD:  Flat, positive bowel sounds normal in frequency in pitch, no bruits, no rebound, no guarding, no midline pulsatile mass, no hepatomegaly, no splenomegaly, obese EXT:  2 plus pulses throughout, mild bilateral leg edema, no cyanosis no clubbing   ASSESSMENT AND PLAN  ATRIAL FIB/FLUTTER The patient tolerates this rhythm without problems and she tolerates anticoagulation. She's had good rate control in the past. No change in therapy is indicated.  C A D -  The patient has no new  sypmtoms since stress testing earlier this year.  HYPERTENSION, UNSPECIFIED -  The blood pressure is at target. No change in medications is indicated. We will continue with therapeutic lifestyle changes (TLC).    OBESITY -  The patient understands the need to lose weight with diet and exercise and she has lost some weight  We talked about this again today. She has lost a couple of pounds.  Carotid stenosis - She at recent very mild plaque. We will follow this periodically.  DYSPNEA - I think this is multifactorial. No change in therapy is indicated. If she doesn't improve with weight loss and exercise then we will reinvestigate.

## 2015-09-29 NOTE — Patient Instructions (Signed)
Your physician wants you to follow-up in: 1 Year. You will receive a reminder letter in the mail two months in advance. If you don't receive a letter, please call our office to schedule the follow-up appointment.  

## 2015-10-15 DIAGNOSIS — Z23 Encounter for immunization: Secondary | ICD-10-CM | POA: Diagnosis not present

## 2015-10-15 DIAGNOSIS — Z1389 Encounter for screening for other disorder: Secondary | ICD-10-CM | POA: Diagnosis not present

## 2015-10-15 DIAGNOSIS — F17200 Nicotine dependence, unspecified, uncomplicated: Secondary | ICD-10-CM | POA: Diagnosis not present

## 2015-10-15 DIAGNOSIS — I10 Essential (primary) hypertension: Secondary | ICD-10-CM | POA: Diagnosis not present

## 2015-10-15 DIAGNOSIS — J449 Chronic obstructive pulmonary disease, unspecified: Secondary | ICD-10-CM | POA: Diagnosis not present

## 2015-10-15 DIAGNOSIS — E784 Other hyperlipidemia: Secondary | ICD-10-CM | POA: Diagnosis not present

## 2015-10-15 DIAGNOSIS — E1159 Type 2 diabetes mellitus with other circulatory complications: Secondary | ICD-10-CM | POA: Diagnosis not present

## 2015-10-23 ENCOUNTER — Other Ambulatory Visit: Payer: Self-pay | Admitting: Internal Medicine

## 2015-10-23 DIAGNOSIS — E2839 Other primary ovarian failure: Secondary | ICD-10-CM

## 2015-10-29 DIAGNOSIS — J449 Chronic obstructive pulmonary disease, unspecified: Secondary | ICD-10-CM | POA: Diagnosis not present

## 2015-11-13 DIAGNOSIS — L603 Nail dystrophy: Secondary | ICD-10-CM | POA: Diagnosis not present

## 2015-11-13 DIAGNOSIS — E1151 Type 2 diabetes mellitus with diabetic peripheral angiopathy without gangrene: Secondary | ICD-10-CM | POA: Diagnosis not present

## 2015-11-13 DIAGNOSIS — L84 Corns and callosities: Secondary | ICD-10-CM | POA: Diagnosis not present

## 2015-11-13 DIAGNOSIS — I739 Peripheral vascular disease, unspecified: Secondary | ICD-10-CM | POA: Diagnosis not present

## 2015-11-28 DIAGNOSIS — J449 Chronic obstructive pulmonary disease, unspecified: Secondary | ICD-10-CM | POA: Diagnosis not present

## 2015-12-19 ENCOUNTER — Ambulatory Visit
Admission: RE | Admit: 2015-12-19 | Discharge: 2015-12-19 | Disposition: A | Discharge status: Home / Self Care | Payer: Medicare Other | Source: Ambulatory Visit | Attending: Internal Medicine | Admitting: Internal Medicine

## 2015-12-19 DIAGNOSIS — E2839 Other primary ovarian failure: Secondary | ICD-10-CM | POA: Diagnosis not present

## 2015-12-24 DIAGNOSIS — N183 Chronic kidney disease, stage 3 (moderate): Secondary | ICD-10-CM | POA: Diagnosis not present

## 2015-12-24 DIAGNOSIS — Z720 Tobacco use: Secondary | ICD-10-CM | POA: Diagnosis not present

## 2015-12-29 DIAGNOSIS — J449 Chronic obstructive pulmonary disease, unspecified: Secondary | ICD-10-CM | POA: Diagnosis not present

## 2016-02-04 ENCOUNTER — Other Ambulatory Visit: Payer: Self-pay

## 2016-02-04 DIAGNOSIS — Z1231 Encounter for screening mammogram for malignant neoplasm of breast: Secondary | ICD-10-CM

## 2016-02-11 DIAGNOSIS — E1159 Type 2 diabetes mellitus with other circulatory complications: Secondary | ICD-10-CM | POA: Diagnosis not present

## 2016-02-11 DIAGNOSIS — I5032 Chronic diastolic (congestive) heart failure: Secondary | ICD-10-CM | POA: Diagnosis not present

## 2016-02-11 DIAGNOSIS — N183 Chronic kidney disease, stage 3 (moderate): Secondary | ICD-10-CM | POA: Diagnosis not present

## 2016-02-11 DIAGNOSIS — I10 Essential (primary) hypertension: Secondary | ICD-10-CM | POA: Diagnosis not present

## 2016-02-11 DIAGNOSIS — J449 Chronic obstructive pulmonary disease, unspecified: Secondary | ICD-10-CM | POA: Diagnosis not present

## 2016-02-13 DIAGNOSIS — L84 Corns and callosities: Secondary | ICD-10-CM | POA: Diagnosis not present

## 2016-02-13 DIAGNOSIS — E1151 Type 2 diabetes mellitus with diabetic peripheral angiopathy without gangrene: Secondary | ICD-10-CM | POA: Diagnosis not present

## 2016-02-13 DIAGNOSIS — I739 Peripheral vascular disease, unspecified: Secondary | ICD-10-CM | POA: Diagnosis not present

## 2016-02-13 DIAGNOSIS — L603 Nail dystrophy: Secondary | ICD-10-CM | POA: Diagnosis not present

## 2016-02-18 ENCOUNTER — Other Ambulatory Visit: Payer: Self-pay

## 2016-02-18 NOTE — Patient Outreach (Signed)
[facility]) Care Management  02/18/2016  Kathleen [name] 06/15/1946 [id number]   Telephone Screen  Referral Date: 02/17/16 Referral Source: [facility] high risk list Referral Reason: CHF   Outreach attempt #1 to patient. Patient reached. Discussed referral and [facility] services. Patient states that she is doing well managing her health care. She denies any issues with transportation or medications. She states she is knowledgeable regarding her conditions and declined further education. Patient voiced that she did not need [facility] services at this time but was appreciative of call.   Plan: RN CM will notify [facility] administrative assistant of case status. RN CM will send MD case closure letter.  Enzo Montgomery, RN,BSN,CCM [facility] Telephonic Care Management Coordinator Direct Phone: [phone] Toll Free: [phone] Fax: [phone]

## 2016-02-19 ENCOUNTER — Ambulatory Visit
Admission: RE | Admit: 2016-02-19 | Discharge: 2016-02-19 | Disposition: A | Discharge status: Home / Self Care | Payer: Medicare Other | Source: Ambulatory Visit

## 2016-02-19 DIAGNOSIS — Z1231 Encounter for screening mammogram for malignant neoplasm of breast: Secondary | ICD-10-CM | POA: Diagnosis not present

## 2016-03-12 ENCOUNTER — Other Ambulatory Visit: Payer: Self-pay | Admitting: *Deleted

## 2016-03-12 MED ORDER — METOPROLOL TARTRATE 25 MG PO TABS: 75.0000 mg | ORAL_TABLET | Freq: Two times a day (BID) | ORAL | Status: DC

## 2016-03-15 ENCOUNTER — Other Ambulatory Visit: Payer: Self-pay | Admitting: *Deleted

## 2016-03-15 DIAGNOSIS — I480 Paroxysmal atrial fibrillation: Secondary | ICD-10-CM

## 2016-03-15 MED ORDER — APIXABAN 5 MG PO TABS: 5.0000 mg | ORAL_TABLET | Freq: Two times a day (BID) | ORAL | Status: DC

## 2016-04-12 ENCOUNTER — Other Ambulatory Visit: Payer: Self-pay

## 2016-04-12 DIAGNOSIS — I480 Paroxysmal atrial fibrillation: Secondary | ICD-10-CM

## 2016-04-12 MED ORDER — APIXABAN 5 MG PO TABS
5.0000 mg | ORAL_TABLET | Freq: Two times a day (BID) | ORAL | Status: DC
Start: 2016-04-12 — End: 2016-05-24

## 2016-05-24 ENCOUNTER — Other Ambulatory Visit: Payer: Self-pay | Admitting: Pharmacist

## 2016-05-24 DIAGNOSIS — I480 Paroxysmal atrial fibrillation: Secondary | ICD-10-CM

## 2016-05-24 MED ORDER — APIXABAN 5 MG PO TABS: 5.0000 mg | ORAL_TABLET | Freq: Two times a day (BID) | ORAL | Status: DC

## 2016-06-09 DIAGNOSIS — M1000 Idiopathic gout, unspecified site: Secondary | ICD-10-CM | POA: Diagnosis not present

## 2016-06-09 DIAGNOSIS — I10 Essential (primary) hypertension: Secondary | ICD-10-CM | POA: Diagnosis not present

## 2016-06-09 DIAGNOSIS — J449 Chronic obstructive pulmonary disease, unspecified: Secondary | ICD-10-CM | POA: Diagnosis not present

## 2016-06-09 DIAGNOSIS — E1159 Type 2 diabetes mellitus with other circulatory complications: Secondary | ICD-10-CM | POA: Diagnosis not present

## 2016-06-09 DIAGNOSIS — I5032 Chronic diastolic (congestive) heart failure: Secondary | ICD-10-CM | POA: Diagnosis not present

## 2016-07-12 ENCOUNTER — Other Ambulatory Visit: Payer: Self-pay | Admitting: Cardiology

## 2016-07-12 DIAGNOSIS — I480 Paroxysmal atrial fibrillation: Secondary | ICD-10-CM

## 2016-07-15 DIAGNOSIS — D2371 Other benign neoplasm of skin of right lower limb, including hip: Secondary | ICD-10-CM | POA: Diagnosis not present

## 2016-07-15 DIAGNOSIS — L84 Corns and callosities: Secondary | ICD-10-CM | POA: Diagnosis not present

## 2016-07-15 DIAGNOSIS — E1151 Type 2 diabetes mellitus with diabetic peripheral angiopathy without gangrene: Secondary | ICD-10-CM | POA: Diagnosis not present

## 2016-07-15 DIAGNOSIS — D492 Neoplasm of unspecified behavior of bone, soft tissue, and skin: Secondary | ICD-10-CM | POA: Diagnosis not present

## 2016-07-15 DIAGNOSIS — I739 Peripheral vascular disease, unspecified: Secondary | ICD-10-CM | POA: Diagnosis not present

## 2016-08-26 DIAGNOSIS — B079 Viral wart, unspecified: Secondary | ICD-10-CM | POA: Diagnosis not present

## 2016-08-26 DIAGNOSIS — D492 Neoplasm of unspecified behavior of bone, soft tissue, and skin: Secondary | ICD-10-CM | POA: Diagnosis not present

## 2016-09-02 DIAGNOSIS — Z720 Tobacco use: Secondary | ICD-10-CM | POA: Diagnosis not present

## 2016-09-02 DIAGNOSIS — I10 Essential (primary) hypertension: Secondary | ICD-10-CM | POA: Diagnosis not present

## 2016-09-02 DIAGNOSIS — N183 Chronic kidney disease, stage 3 (moderate): Secondary | ICD-10-CM | POA: Diagnosis not present

## 2016-09-22 ENCOUNTER — Encounter: Payer: Self-pay | Admitting: Cardiology

## 2016-10-05 ENCOUNTER — Encounter: Payer: Self-pay | Admitting: Cardiology

## 2016-10-05 ENCOUNTER — Ambulatory Visit (INDEPENDENT_AMBULATORY_CARE_PROVIDER_SITE_OTHER): Payer: Medicare Other | Admitting: Cardiology

## 2016-10-05 VITALS — BP 128/52 | HR 71 | Ht 64.0 in | Wt 251.8 lb

## 2016-10-05 DIAGNOSIS — I480 Paroxysmal atrial fibrillation: Secondary | ICD-10-CM | POA: Diagnosis not present

## 2016-10-05 DIAGNOSIS — I10 Essential (primary) hypertension: Secondary | ICD-10-CM

## 2016-10-05 NOTE — Progress Notes (Signed)
HPI The patient presents for evaluation of atrial fib. Since I last saw her she has had no new symptoms. She denies any palpitations, presyncope or syncope. She gets around with a cane because of her balance. He has no problems with this. She does a lot of traveling visiting family. She goes to [place] in [place] in [place]. She's a Presenter, broadcasting.  The patient denies any new symptoms such as chest discomfort, neck or arm discomfort. There has been no new shortness of breath, PND or orthopnea. There have been no reported palpitations, presyncope or syncope.  Allergies  Allergen Reactions  . Penicillins Other (See Comments)    unknown    Current Outpatient Prescriptions  Medication Sig Dispense Refill  . acetaminophen (TYLENOL) 325 MG tablet Take 975 mg by mouth every 6 (six) hours as needed (pain).    Marland Kitchen albuterol (PROVENTIL HFA;VENTOLIN HFA) 108 (90 BASE) MCG/ACT inhaler Inhale 1-2 puffs into the lungs every 6 (six) hours as needed for wheezing or shortness of breath. 1 Inhaler 0  . allopurinol (ZYLOPRIM) 100 MG tablet Take 100 mg by mouth daily.    . colchicine 0.6 MG tablet Take 0.6 mg by mouth daily as needed (for gout flare ups).     . cyclobenzaprine (FLEXERIL) 10 MG tablet Take 1 tablet (10 mg total) by mouth 2 (two) times daily as needed for muscle spasms. 20 tablet 0  . ELIQUIS 5 MG TABS tablet take 1 tablet by mouth twice a day 60 tablet 1  . famotidine (PEPCID) 20 MG tablet Take 20 mg by mouth at bedtime.     . furosemide (LASIX) 80 MG tablet Take 80 mg by mouth daily.  0  . glipiZIDE (GLUCOTROL) 5 MG tablet Take 1 tablet (5 mg total) by mouth 2 (two) times daily before a meal. 60 tablet 1  . irbesartan (AVAPRO) 75 MG tablet Take 1 tablet (75 mg total) by mouth daily. 30 tablet 6  . isosorbide-hydrALAZINE (BIDIL) 20-37.5 MG per tablet Take 1 tablet by mouth 3 (three) times daily. 90 tablet 9  . metoprolol tartrate (LOPRESSOR) 25 MG tablet take 3 tablets by mouth  twice a day 180 tablet 1  . naproxen (NAPROSYN) 500 MG tablet Take 1 tablet (500 mg total) by mouth 2 (two) times daily with a meal. 15 tablet 0  . pravastatin (PRAVACHOL) 80 MG tablet Take 1 tablet (80 mg total) by mouth daily. 30 tablet 6   No current facility-administered medications for this visit.     Past Medical History:  Diagnosis Date  . Arthritis   . Blood transfusion    no side affects  . CHF (congestive heart failure) ([place])   . Coronary artery disease    Cypher stent to the RCA in 2003  . Diabetes mellitus    Borderline  . GERD (gastroesophageal reflux disease)   . Hypertension   . Shortness of breath     Past Surgical History:  Procedure Laterality Date  . CARDIOVERSION N/A 03/17/2015   Procedure: CARDIOVERSION;  Surgeon: Sueanne Margarita, MD;  [facility];  Service: Cardiovascular;  Laterality: N/A;  . CHOLECYSTECTOMY    . CORONARY ANGIOPLASTY WITH STENT PLACEMENT    . TOTAL KNEE ARTHROPLASTY      ROS:    As stated in the HPI and negative for all other systems.  PHYSICAL EXAM BP (!) 128/52   Pulse 71   Ht 5\' 4"  (1.626 m)   Wt 251 lb 12.8 oz (  114.2 kg)   BMI 43.22 kg/m  GENERAL:  Well appearing HEENT:  Pupils equal round and reactive, fundi not visualized, oral mucosa unremarkable, dentures NECK:  No jugular venous distention, waveform within normal limits, carotid upstroke brisk and symmetric, right bruit, no thyromegaly LUNGS:  Clear to auscultation bilaterally CHEST:  Unremarkable HEART:  PMI not displaced or sustained,S1 and S2 within normal limits, no S3, no clicks, no rubs, no murmurs, irregular ABD:  Flat, positive bowel sounds normal in frequency in pitch, no bruits, no rebound, no guarding, no midline pulsatile mass, no hepatomegaly, no splenomegaly, obese EXT:  2 plus pulses throughout, mild bilateral leg edema, no cyanosis no clubbing  EKG:  Sinus rhythm, rate 71, axis within normal limits, intervals within limits, premature atrial  contractions.  10/05/2016  ASSESSMENT AND PLAN  ATRIAL FIB/FLUTTER The patient tolerates this rhythm without problems and she tolerates anticoagulation. She's had good rate control in the past.   Her creatinine will allow her to stay on the current dose of Eliquis. However, we need to follow her creatinine closely.  CKD Creat 1.83 in 9/21.  This is followed by renal.    C A D -  The patient has no new sypmtoms since stress testing last year.  No change in therapy is planned.   HYPERTENSION, UNSPECIFIED -  The blood pressure is at target. No change in medications is indicated. We will continue with therapeutic lifestyle changes (TLC).    OBESITY -  The patient understands the need to lose weight with diet and exercise .   Carotid stenosis - She at recent very mild plaque. We will follow this periodically.  DYSPNEA - This sounds to be improved.  No further work up is indicated.

## 2016-10-05 NOTE — Patient Instructions (Signed)

## 2016-10-11 DIAGNOSIS — J449 Chronic obstructive pulmonary disease, unspecified: Secondary | ICD-10-CM | POA: Diagnosis not present

## 2016-10-11 DIAGNOSIS — I10 Essential (primary) hypertension: Secondary | ICD-10-CM | POA: Diagnosis not present

## 2016-10-11 DIAGNOSIS — I2510 Atherosclerotic heart disease of native coronary artery without angina pectoris: Secondary | ICD-10-CM | POA: Diagnosis not present

## 2016-10-11 DIAGNOSIS — E1159 Type 2 diabetes mellitus with other circulatory complications: Secondary | ICD-10-CM | POA: Diagnosis not present

## 2016-10-11 DIAGNOSIS — M1000 Idiopathic gout, unspecified site: Secondary | ICD-10-CM | POA: Diagnosis not present

## 2016-10-11 DIAGNOSIS — Z23 Encounter for immunization: Secondary | ICD-10-CM | POA: Diagnosis not present

## 2016-10-13 IMAGING — CR DG CHEST 1V PORT
1 series · 1 of 1 positions shown · non-contrast
Comparison: 09/17/2014

CLINICAL DATA: COPD exacerbation.

EXAM:
PORTABLE CHEST - 1 VIEW

[portable]
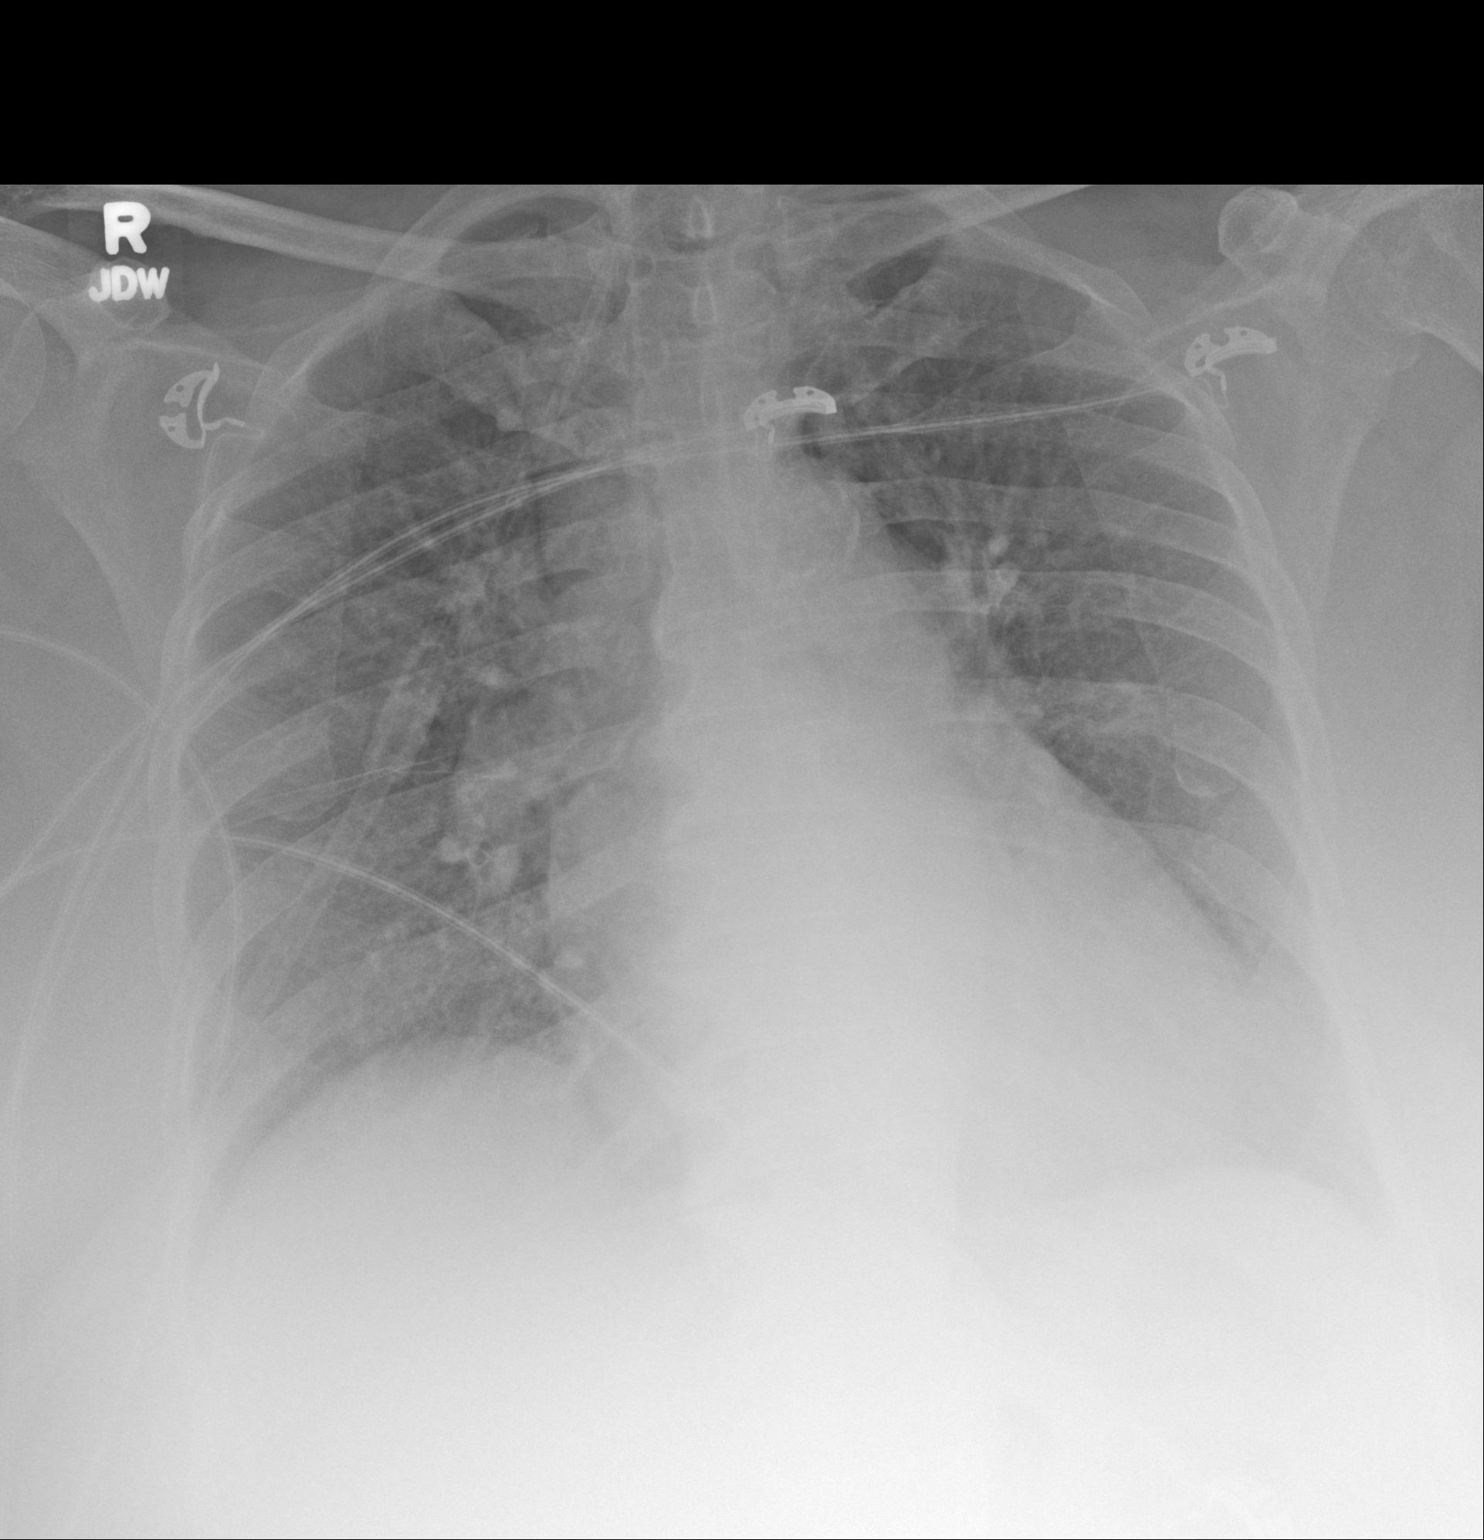

[1 of 1 positions shown; findings below may reference images not displayed]

FINDINGS: Stable cardiac and mediastinal contours. Stable enlarged cardiac and
mediastinal contours. Pulmonary venous hypertension with mild
interstitial pulmonary edema. No large consolidative pulmonary
opacities. No definite pleural effusion or pneumothorax.
IMPRESSION: Cardiomegaly with pulmonary venous hypertension and mild
interstitial pulmonary edema.

## 2016-11-07 ENCOUNTER — Other Ambulatory Visit: Payer: Self-pay | Admitting: Cardiology

## 2016-11-08 NOTE — Telephone Encounter (Signed)
REFILL 

## 2017-01-10 DIAGNOSIS — E1159 Type 2 diabetes mellitus with other circulatory complications: Secondary | ICD-10-CM | POA: Diagnosis not present

## 2017-01-10 DIAGNOSIS — M545 Low back pain: Secondary | ICD-10-CM | POA: Diagnosis not present

## 2017-01-10 DIAGNOSIS — I5032 Chronic diastolic (congestive) heart failure: Secondary | ICD-10-CM | POA: Diagnosis not present

## 2017-01-10 DIAGNOSIS — J449 Chronic obstructive pulmonary disease, unspecified: Secondary | ICD-10-CM | POA: Diagnosis not present

## 2017-01-10 DIAGNOSIS — I10 Essential (primary) hypertension: Secondary | ICD-10-CM | POA: Diagnosis not present

## 2017-01-17 ENCOUNTER — Other Ambulatory Visit: Payer: Self-pay | Admitting: Internal Medicine

## 2017-01-17 DIAGNOSIS — E2839 Other primary ovarian failure: Secondary | ICD-10-CM

## 2017-01-21 ENCOUNTER — Other Ambulatory Visit: Payer: Medicare Other

## 2017-02-23 DIAGNOSIS — N183 Chronic kidney disease, stage 3 (moderate): Secondary | ICD-10-CM | POA: Diagnosis not present

## 2017-02-23 DIAGNOSIS — I10 Essential (primary) hypertension: Secondary | ICD-10-CM | POA: Diagnosis not present

## 2017-02-23 DIAGNOSIS — Z720 Tobacco use: Secondary | ICD-10-CM | POA: Diagnosis not present

## 2017-04-11 DIAGNOSIS — I5032 Chronic diastolic (congestive) heart failure: Secondary | ICD-10-CM | POA: Diagnosis not present

## 2017-04-11 DIAGNOSIS — E784 Other hyperlipidemia: Secondary | ICD-10-CM | POA: Diagnosis not present

## 2017-04-11 DIAGNOSIS — J449 Chronic obstructive pulmonary disease, unspecified: Secondary | ICD-10-CM | POA: Diagnosis not present

## 2017-04-11 DIAGNOSIS — E1159 Type 2 diabetes mellitus with other circulatory complications: Secondary | ICD-10-CM | POA: Diagnosis not present

## 2017-04-11 DIAGNOSIS — I10 Essential (primary) hypertension: Secondary | ICD-10-CM | POA: Diagnosis not present

## 2017-08-24 ENCOUNTER — Ambulatory Visit (INDEPENDENT_AMBULATORY_CARE_PROVIDER_SITE_OTHER): Payer: Medicare Other | Admitting: Orthopaedic Surgery

## 2017-08-24 ENCOUNTER — Ambulatory Visit (INDEPENDENT_AMBULATORY_CARE_PROVIDER_SITE_OTHER): Payer: Medicare Other

## 2017-08-24 DIAGNOSIS — M25562 Pain in left knee: Secondary | ICD-10-CM

## 2017-08-24 NOTE — Progress Notes (Signed)
Office Visit Note   Patient: Kathleen [name]           Date of Birth: 1946/06/17           MRN: [id number] Visit Date: 08/24/2017              Requested by: Levin Erp, MD [place], [address], [place] [address] PCP: Levin Erp, MD   Assessment & Plan: Visit Diagnoses:  1. Acute pain of left knee     Plan: This seems like more of a contusion and sprain to the knee itself. She does have a cane at home and have recommended she use a cane in her opposite hand for the next 2-3 weeks she should feel better with time. This did give her reassurance gone over the x-rays with her. I did let her know though if her pain continues over the next few weeks to several months I will see her back and she understands this. She'll otherwise follow up as needed.  Follow-Up Instructions: Return if symptoms worsen or fail to improve.   Orders:  Orders Placed This Encounter  Procedures  . XR Knee 1-2 Views Left   No orders of the defined types were placed in this encounter.     Procedures: No procedures performed   Clinical Data: No additional findings.   Subjective: No chief complaint on file. The patient is a 71 year old who has a history of a left total knee arthroplasty and a right total knee arthroplasty. She's been doing well but she fell onto her left knee about 3 weeks ago and has had pain since then. She cannot go anywhere for treatment still having some pain and wants her knee checked out today for reassurance. She points to her proximal tibia and the shin as source of her pain. She denies any locking catching or giving way of the knee. Is been more of an achy type of pain since she fell. She said she was pain free before her fall.  HPI  Review of Systems She denies any headache, chest pain, short of breath, fever, chills, nausea, vomiting.  Objective: Vital Signs: There were no vitals taken for this visit.  Physical Exam He is alert or 3 and in no acute  distress Ortho Exam Examination of her left knee shows her able to fully extend her knee and hold it extended. She also fully flex the knee. Feels ligamentously stable. She has some bruising and pain over the proximal tibia just to the lateral aspect of the tibial tubercle but notes joint line tenderness. Specialty Comments:  No specialty comments available.  Imaging: Xr Knee 1-2 Views Left  Result Date: 08/24/2017 2 views left knee show well-seated total knee arthroplasty. There is some chronic scar tissue in but overall is no evidence of loosening alignment appears normal. I see no fractures around the femur or tibia.    PMFS History: Patient Active Problem List   Diagnosis Date Noted  . Atrial flutter ([place]) 03/17/2015  . NSTEMI (non-ST elevated myocardial infarction) ([place])   . Chronic diastolic CHF (congestive heart failure) ([place])   . Diabetes type 2, controlled ([place])   . Paroxysmal atrial fibrillation ([place]) 01/25/2015  . Non-ST elevation MI (NSTEMI) ([place]) 01/23/2015  . Diabetes mellitus type 2, controlled ([place]) 01/23/2015  . HLD (hyperlipidemia)   . Chronic kidney disease, stage III (moderate)   . Acute congestive heart failure ([place]) 09/17/2014  . Postmenopausal bleeding 01/10/2014  . Carotid stenosis 01/04/2012  .  Carpal tunnel syndrome on left 11/26/2011  . Transient ischemic attack on medication 11/02/2011  . Diabetes mellitus ([place]) 11/02/2011  . Chronic kidney disease 11/02/2011  . Hyperlipidemia with target LDL less than 70 09/17/2008  . OBESITY 09/17/2008  . Essential hypertension 09/17/2008  . Coronary atherosclerosis 09/17/2008   Past Medical History:  Diagnosis Date  . Arthritis   . Blood transfusion    no side affects  . CHF (congestive heart failure) ([place])   . Coronary artery disease    Cypher stent to the RCA in 2003  . Diabetes mellitus    Borderline  . GERD (gastroesophageal reflux disease)   . Hypertension   . Shortness of breath     Family  History  Problem Relation Age of Onset  . Hypertension Father   . Heart disease Father   . Gout Father   . Arthritis Father   . Heart attack Father   . Heart attack Brother   . Hypertension Brother   . Hypertension Sister   . Stroke Neg Hx     Past Surgical History:  Procedure Laterality Date  . CARDIOVERSION N/A 03/17/2015   Procedure: CARDIOVERSION;  Surgeon: Sueanne Margarita, MD;  [facility];  Service: Cardiovascular;  Laterality: N/A;  . CHOLECYSTECTOMY    . CORONARY ANGIOPLASTY WITH STENT PLACEMENT    . TOTAL KNEE ARTHROPLASTY     Social History   Occupational History  . Not on file.   Social History Main Topics  . Smoking status: Current Every Day Smoker    Packs/day: 0.30    Years: 30.00    Types: Cigarettes    Last attempt to quit: 01/18/2014  . Smokeless tobacco: Never Used     Comment: Smoking cessation requested   . Alcohol use No  . Drug use: No  . Sexual activity: Not Currently    Birth control/ protection: Post-menopausal

## 2017-09-12 DIAGNOSIS — I6789 Other cerebrovascular disease: Secondary | ICD-10-CM | POA: Diagnosis not present

## 2017-09-13 DIAGNOSIS — I6789 Other cerebrovascular disease: Secondary | ICD-10-CM | POA: Diagnosis not present

## 2017-09-14 DIAGNOSIS — I6789 Other cerebrovascular disease: Secondary | ICD-10-CM | POA: Diagnosis not present

## 2017-09-14 DIAGNOSIS — N183 Chronic kidney disease, stage 3 (moderate): Secondary | ICD-10-CM | POA: Diagnosis not present

## 2017-09-15 DIAGNOSIS — I6789 Other cerebrovascular disease: Secondary | ICD-10-CM | POA: Diagnosis not present

## 2017-09-16 DIAGNOSIS — I6789 Other cerebrovascular disease: Secondary | ICD-10-CM | POA: Diagnosis not present

## 2017-09-17 DIAGNOSIS — I6789 Other cerebrovascular disease: Secondary | ICD-10-CM | POA: Diagnosis not present

## 2017-09-18 DIAGNOSIS — I6789 Other cerebrovascular disease: Secondary | ICD-10-CM | POA: Diagnosis not present

## 2017-09-19 DIAGNOSIS — I6789 Other cerebrovascular disease: Secondary | ICD-10-CM | POA: Diagnosis not present

## 2017-09-20 DIAGNOSIS — I6789 Other cerebrovascular disease: Secondary | ICD-10-CM | POA: Diagnosis not present

## 2017-09-21 DIAGNOSIS — I6789 Other cerebrovascular disease: Secondary | ICD-10-CM | POA: Diagnosis not present

## 2017-09-22 DIAGNOSIS — I6789 Other cerebrovascular disease: Secondary | ICD-10-CM | POA: Diagnosis not present

## 2017-09-23 DIAGNOSIS — I6789 Other cerebrovascular disease: Secondary | ICD-10-CM | POA: Diagnosis not present

## 2017-09-24 DIAGNOSIS — I6789 Other cerebrovascular disease: Secondary | ICD-10-CM | POA: Diagnosis not present

## 2017-09-25 DIAGNOSIS — I6789 Other cerebrovascular disease: Secondary | ICD-10-CM | POA: Diagnosis not present

## 2017-09-26 DIAGNOSIS — I6789 Other cerebrovascular disease: Secondary | ICD-10-CM | POA: Diagnosis not present

## 2017-09-27 DIAGNOSIS — I6789 Other cerebrovascular disease: Secondary | ICD-10-CM | POA: Diagnosis not present

## 2017-09-28 DIAGNOSIS — I6789 Other cerebrovascular disease: Secondary | ICD-10-CM | POA: Diagnosis not present

## 2017-09-29 DIAGNOSIS — I6789 Other cerebrovascular disease: Secondary | ICD-10-CM | POA: Diagnosis not present

## 2017-09-30 DIAGNOSIS — I6789 Other cerebrovascular disease: Secondary | ICD-10-CM | POA: Diagnosis not present

## 2017-10-01 DIAGNOSIS — I6789 Other cerebrovascular disease: Secondary | ICD-10-CM | POA: Diagnosis not present

## 2017-10-02 DIAGNOSIS — I6789 Other cerebrovascular disease: Secondary | ICD-10-CM | POA: Diagnosis not present

## 2017-10-03 DIAGNOSIS — I6789 Other cerebrovascular disease: Secondary | ICD-10-CM | POA: Diagnosis not present

## 2017-10-04 DIAGNOSIS — I6789 Other cerebrovascular disease: Secondary | ICD-10-CM | POA: Diagnosis not present

## 2017-10-05 DIAGNOSIS — I6789 Other cerebrovascular disease: Secondary | ICD-10-CM | POA: Diagnosis not present

## 2017-10-06 DIAGNOSIS — I6789 Other cerebrovascular disease: Secondary | ICD-10-CM | POA: Diagnosis not present

## 2017-10-07 ENCOUNTER — Other Ambulatory Visit: Payer: Self-pay | Admitting: Internal Medicine

## 2017-10-07 DIAGNOSIS — Z1231 Encounter for screening mammogram for malignant neoplasm of breast: Secondary | ICD-10-CM

## 2017-10-07 DIAGNOSIS — I6789 Other cerebrovascular disease: Secondary | ICD-10-CM | POA: Diagnosis not present

## 2017-10-08 ENCOUNTER — Other Ambulatory Visit: Payer: Self-pay | Admitting: Cardiology

## 2017-10-08 DIAGNOSIS — I6789 Other cerebrovascular disease: Secondary | ICD-10-CM | POA: Diagnosis not present

## 2017-10-09 DIAGNOSIS — I6789 Other cerebrovascular disease: Secondary | ICD-10-CM | POA: Diagnosis not present

## 2017-10-10 DIAGNOSIS — I6789 Other cerebrovascular disease: Secondary | ICD-10-CM | POA: Diagnosis not present

## 2017-10-11 DIAGNOSIS — I6789 Other cerebrovascular disease: Secondary | ICD-10-CM | POA: Diagnosis not present

## 2017-10-12 DIAGNOSIS — I6789 Other cerebrovascular disease: Secondary | ICD-10-CM | POA: Diagnosis not present

## 2017-10-13 DIAGNOSIS — I6789 Other cerebrovascular disease: Secondary | ICD-10-CM | POA: Diagnosis not present

## 2017-10-14 DIAGNOSIS — I6789 Other cerebrovascular disease: Secondary | ICD-10-CM | POA: Diagnosis not present

## 2017-10-15 DIAGNOSIS — I6789 Other cerebrovascular disease: Secondary | ICD-10-CM | POA: Diagnosis not present

## 2017-10-16 DIAGNOSIS — I6789 Other cerebrovascular disease: Secondary | ICD-10-CM | POA: Diagnosis not present

## 2017-10-17 DIAGNOSIS — I6789 Other cerebrovascular disease: Secondary | ICD-10-CM | POA: Diagnosis not present

## 2017-10-18 DIAGNOSIS — I6789 Other cerebrovascular disease: Secondary | ICD-10-CM | POA: Diagnosis not present

## 2017-10-19 DIAGNOSIS — I6789 Other cerebrovascular disease: Secondary | ICD-10-CM | POA: Diagnosis not present

## 2017-10-20 DIAGNOSIS — I6789 Other cerebrovascular disease: Secondary | ICD-10-CM | POA: Diagnosis not present

## 2017-10-21 DIAGNOSIS — I6789 Other cerebrovascular disease: Secondary | ICD-10-CM | POA: Diagnosis not present

## 2017-10-22 DIAGNOSIS — I6789 Other cerebrovascular disease: Secondary | ICD-10-CM | POA: Diagnosis not present

## 2017-10-23 DIAGNOSIS — I6789 Other cerebrovascular disease: Secondary | ICD-10-CM | POA: Diagnosis not present

## 2017-10-24 DIAGNOSIS — I6789 Other cerebrovascular disease: Secondary | ICD-10-CM | POA: Diagnosis not present

## 2017-10-25 DIAGNOSIS — I6789 Other cerebrovascular disease: Secondary | ICD-10-CM | POA: Diagnosis not present

## 2017-10-26 DIAGNOSIS — I6789 Other cerebrovascular disease: Secondary | ICD-10-CM | POA: Diagnosis not present

## 2017-10-27 DIAGNOSIS — I6789 Other cerebrovascular disease: Secondary | ICD-10-CM | POA: Diagnosis not present

## 2017-10-28 ENCOUNTER — Ambulatory Visit
Admission: RE | Admit: 2017-10-28 | Discharge: 2017-10-28 | Disposition: A | Discharge status: Home / Self Care | Payer: Medicare HMO | Source: Ambulatory Visit | Attending: Internal Medicine | Admitting: Internal Medicine

## 2017-10-28 DIAGNOSIS — I6789 Other cerebrovascular disease: Secondary | ICD-10-CM | POA: Diagnosis not present

## 2017-10-28 DIAGNOSIS — Z1231 Encounter for screening mammogram for malignant neoplasm of breast: Secondary | ICD-10-CM | POA: Diagnosis not present

## 2017-10-29 DIAGNOSIS — I6789 Other cerebrovascular disease: Secondary | ICD-10-CM | POA: Diagnosis not present

## 2017-10-30 DIAGNOSIS — I6789 Other cerebrovascular disease: Secondary | ICD-10-CM | POA: Diagnosis not present

## 2017-10-31 DIAGNOSIS — I6789 Other cerebrovascular disease: Secondary | ICD-10-CM | POA: Diagnosis not present

## 2017-11-01 DIAGNOSIS — I6789 Other cerebrovascular disease: Secondary | ICD-10-CM | POA: Diagnosis not present

## 2017-11-02 DIAGNOSIS — I6789 Other cerebrovascular disease: Secondary | ICD-10-CM | POA: Diagnosis not present

## 2017-11-03 DIAGNOSIS — I6789 Other cerebrovascular disease: Secondary | ICD-10-CM | POA: Diagnosis not present

## 2017-11-04 DIAGNOSIS — I6789 Other cerebrovascular disease: Secondary | ICD-10-CM | POA: Diagnosis not present

## 2017-11-05 DIAGNOSIS — I6789 Other cerebrovascular disease: Secondary | ICD-10-CM | POA: Diagnosis not present

## 2017-11-06 DIAGNOSIS — I6789 Other cerebrovascular disease: Secondary | ICD-10-CM | POA: Diagnosis not present

## 2017-11-07 DIAGNOSIS — I6789 Other cerebrovascular disease: Secondary | ICD-10-CM | POA: Diagnosis not present

## 2017-11-08 DIAGNOSIS — I6789 Other cerebrovascular disease: Secondary | ICD-10-CM | POA: Diagnosis not present

## 2017-11-09 DIAGNOSIS — I6789 Other cerebrovascular disease: Secondary | ICD-10-CM | POA: Diagnosis not present

## 2017-11-10 DIAGNOSIS — I6789 Other cerebrovascular disease: Secondary | ICD-10-CM | POA: Diagnosis not present

## 2017-11-11 DIAGNOSIS — R040 Epistaxis: Secondary | ICD-10-CM | POA: Diagnosis not present

## 2017-11-11 DIAGNOSIS — Z23 Encounter for immunization: Secondary | ICD-10-CM | POA: Diagnosis not present

## 2017-11-11 DIAGNOSIS — N183 Chronic kidney disease, stage 3 (moderate): Secondary | ICD-10-CM | POA: Diagnosis not present

## 2017-11-11 DIAGNOSIS — I10 Essential (primary) hypertension: Secondary | ICD-10-CM | POA: Diagnosis not present

## 2017-11-11 DIAGNOSIS — I6789 Other cerebrovascular disease: Secondary | ICD-10-CM | POA: Diagnosis not present

## 2017-11-11 DIAGNOSIS — I5032 Chronic diastolic (congestive) heart failure: Secondary | ICD-10-CM | POA: Diagnosis not present

## 2017-11-11 DIAGNOSIS — E1159 Type 2 diabetes mellitus with other circulatory complications: Secondary | ICD-10-CM | POA: Diagnosis not present

## 2017-11-12 DIAGNOSIS — I6789 Other cerebrovascular disease: Secondary | ICD-10-CM | POA: Diagnosis not present

## 2017-11-13 DIAGNOSIS — I6789 Other cerebrovascular disease: Secondary | ICD-10-CM | POA: Diagnosis not present

## 2017-11-14 DIAGNOSIS — I6789 Other cerebrovascular disease: Secondary | ICD-10-CM | POA: Diagnosis not present

## 2017-11-15 DIAGNOSIS — I6789 Other cerebrovascular disease: Secondary | ICD-10-CM | POA: Diagnosis not present

## 2017-11-16 DIAGNOSIS — I6789 Other cerebrovascular disease: Secondary | ICD-10-CM | POA: Diagnosis not present

## 2017-11-17 DIAGNOSIS — I6789 Other cerebrovascular disease: Secondary | ICD-10-CM | POA: Diagnosis not present

## 2017-11-18 DIAGNOSIS — I6789 Other cerebrovascular disease: Secondary | ICD-10-CM | POA: Diagnosis not present

## 2017-11-19 DIAGNOSIS — I6789 Other cerebrovascular disease: Secondary | ICD-10-CM | POA: Diagnosis not present

## 2017-11-20 DIAGNOSIS — I6789 Other cerebrovascular disease: Secondary | ICD-10-CM | POA: Diagnosis not present

## 2017-11-21 DIAGNOSIS — I6789 Other cerebrovascular disease: Secondary | ICD-10-CM | POA: Diagnosis not present

## 2017-11-22 DIAGNOSIS — I6789 Other cerebrovascular disease: Secondary | ICD-10-CM | POA: Diagnosis not present

## 2017-11-23 DIAGNOSIS — I6789 Other cerebrovascular disease: Secondary | ICD-10-CM | POA: Diagnosis not present

## 2017-11-24 DIAGNOSIS — I6789 Other cerebrovascular disease: Secondary | ICD-10-CM | POA: Diagnosis not present

## 2017-11-25 DIAGNOSIS — I6789 Other cerebrovascular disease: Secondary | ICD-10-CM | POA: Diagnosis not present

## 2017-11-26 DIAGNOSIS — I6789 Other cerebrovascular disease: Secondary | ICD-10-CM | POA: Diagnosis not present

## 2017-11-27 DIAGNOSIS — I6789 Other cerebrovascular disease: Secondary | ICD-10-CM | POA: Diagnosis not present

## 2017-11-28 DIAGNOSIS — I6789 Other cerebrovascular disease: Secondary | ICD-10-CM | POA: Diagnosis not present

## 2017-11-29 DIAGNOSIS — I6789 Other cerebrovascular disease: Secondary | ICD-10-CM | POA: Diagnosis not present

## 2017-11-30 DIAGNOSIS — I6789 Other cerebrovascular disease: Secondary | ICD-10-CM | POA: Diagnosis not present

## 2017-12-01 DIAGNOSIS — I6789 Other cerebrovascular disease: Secondary | ICD-10-CM | POA: Diagnosis not present

## 2017-12-02 DIAGNOSIS — I6789 Other cerebrovascular disease: Secondary | ICD-10-CM | POA: Diagnosis not present

## 2017-12-03 DIAGNOSIS — I6789 Other cerebrovascular disease: Secondary | ICD-10-CM | POA: Diagnosis not present

## 2017-12-04 ENCOUNTER — Emergency Department (HOSPITAL_COMMUNITY): Payer: Medicare HMO

## 2017-12-04 ENCOUNTER — Encounter (HOSPITAL_COMMUNITY): Payer: Self-pay | Admitting: Emergency Medicine

## 2017-12-04 ENCOUNTER — Other Ambulatory Visit: Payer: Self-pay

## 2017-12-04 ENCOUNTER — Inpatient Hospital Stay (HOSPITAL_COMMUNITY)
Admission: EM | Admit: 2017-12-04 | Discharge: 2017-12-06 | DRG: 871 | Disposition: A | Discharge status: Home Health | Payer: Medicare HMO | Attending: Family Medicine | Admitting: Family Medicine

## 2017-12-04 DIAGNOSIS — E11649 Type 2 diabetes mellitus with hypoglycemia without coma: Secondary | ICD-10-CM | POA: Diagnosis present

## 2017-12-04 DIAGNOSIS — R6510 Systemic inflammatory response syndrome (SIRS) of non-infectious origin without acute organ dysfunction: Secondary | ICD-10-CM | POA: Diagnosis present

## 2017-12-04 DIAGNOSIS — Z6841 Body Mass Index (BMI) 40.0 and over, adult: Secondary | ICD-10-CM

## 2017-12-04 DIAGNOSIS — N183 Chronic kidney disease, stage 3 (moderate): Secondary | ICD-10-CM | POA: Diagnosis not present

## 2017-12-04 DIAGNOSIS — T68XXXA Hypothermia, initial encounter: Secondary | ICD-10-CM | POA: Diagnosis not present

## 2017-12-04 DIAGNOSIS — N179 Acute kidney failure, unspecified: Secondary | ICD-10-CM | POA: Diagnosis not present

## 2017-12-04 DIAGNOSIS — J1008 Influenza due to other identified influenza virus with other specified pneumonia: Secondary | ICD-10-CM | POA: Diagnosis present

## 2017-12-04 DIAGNOSIS — M1990 Unspecified osteoarthritis, unspecified site: Secondary | ICD-10-CM | POA: Diagnosis present

## 2017-12-04 DIAGNOSIS — I480 Paroxysmal atrial fibrillation: Secondary | ICD-10-CM | POA: Diagnosis not present

## 2017-12-04 DIAGNOSIS — A4189 Other specified sepsis: Secondary | ICD-10-CM | POA: Diagnosis not present

## 2017-12-04 DIAGNOSIS — E669 Obesity, unspecified: Secondary | ICD-10-CM | POA: Diagnosis not present

## 2017-12-04 DIAGNOSIS — F17210 Nicotine dependence, cigarettes, uncomplicated: Secondary | ICD-10-CM | POA: Diagnosis present

## 2017-12-04 DIAGNOSIS — I10 Essential (primary) hypertension: Secondary | ICD-10-CM | POA: Diagnosis not present

## 2017-12-04 DIAGNOSIS — I130 Hypertensive heart and chronic kidney disease with heart failure and stage 1 through stage 4 chronic kidney disease, or unspecified chronic kidney disease: Secondary | ICD-10-CM | POA: Diagnosis present

## 2017-12-04 DIAGNOSIS — Z955 Presence of coronary angioplasty implant and graft: Secondary | ICD-10-CM | POA: Diagnosis not present

## 2017-12-04 DIAGNOSIS — K219 Gastro-esophageal reflux disease without esophagitis: Secondary | ICD-10-CM | POA: Diagnosis present

## 2017-12-04 DIAGNOSIS — I5032 Chronic diastolic (congestive) heart failure: Secondary | ICD-10-CM | POA: Diagnosis present

## 2017-12-04 DIAGNOSIS — Z7984 Long term (current) use of oral hypoglycemic drugs: Secondary | ICD-10-CM

## 2017-12-04 DIAGNOSIS — Z7901 Long term (current) use of anticoagulants: Secondary | ICD-10-CM

## 2017-12-04 DIAGNOSIS — Z79899 Other long term (current) drug therapy: Secondary | ICD-10-CM | POA: Diagnosis not present

## 2017-12-04 DIAGNOSIS — A419 Sepsis, unspecified organism: Secondary | ICD-10-CM | POA: Diagnosis not present

## 2017-12-04 DIAGNOSIS — J101 Influenza due to other identified influenza virus with other respiratory manifestations: Secondary | ICD-10-CM | POA: Diagnosis not present

## 2017-12-04 DIAGNOSIS — R680 Hypothermia, not associated with low environmental temperature: Secondary | ICD-10-CM | POA: Diagnosis present

## 2017-12-04 DIAGNOSIS — R945 Abnormal results of liver function studies: Secondary | ICD-10-CM

## 2017-12-04 DIAGNOSIS — Z880 Allergy status to penicillin: Secondary | ICD-10-CM | POA: Diagnosis not present

## 2017-12-04 DIAGNOSIS — R05 Cough: Secondary | ICD-10-CM

## 2017-12-04 DIAGNOSIS — R7989 Other specified abnormal findings of blood chemistry: Secondary | ICD-10-CM | POA: Diagnosis present

## 2017-12-04 DIAGNOSIS — E1122 Type 2 diabetes mellitus with diabetic chronic kidney disease: Secondary | ICD-10-CM | POA: Diagnosis not present

## 2017-12-04 DIAGNOSIS — R059 Cough, unspecified: Secondary | ICD-10-CM

## 2017-12-04 DIAGNOSIS — E162 Hypoglycemia, unspecified: Secondary | ICD-10-CM | POA: Diagnosis not present

## 2017-12-04 DIAGNOSIS — N184 Chronic kidney disease, stage 4 (severe): Secondary | ICD-10-CM | POA: Diagnosis not present

## 2017-12-04 DIAGNOSIS — E161 Other hypoglycemia: Secondary | ICD-10-CM | POA: Diagnosis not present

## 2017-12-04 DIAGNOSIS — E119 Type 2 diabetes mellitus without complications: Secondary | ICD-10-CM

## 2017-12-04 DIAGNOSIS — I2510 Atherosclerotic heart disease of native coronary artery without angina pectoris: Secondary | ICD-10-CM | POA: Diagnosis present

## 2017-12-04 LAB — COMPREHENSIVE METABOLIC PANEL
ALT: 56 U/L — AB (ref 14–54)
AST: 72 U/L — AB (ref 15–41)
Albumin: 3.4 g/dL — ABNORMAL LOW (ref 3.5–5.0)
Alkaline Phosphatase: 92 U/L (ref 38–126)
Anion gap: 9 (ref 5–15)
BUN: 66 mg/dL — ABNORMAL HIGH (ref 6–20)
CHLORIDE: 101 mmol/L (ref 101–111)
CO2: 26 mmol/L (ref 22–32)
CREATININE: 2.13 mg/dL — AB (ref 0.44–1.00)
Calcium: 8.7 mg/dL — ABNORMAL LOW (ref 8.9–10.3)
GFR calc non Af Amer: 22 mL/min — ABNORMAL LOW (ref >60)
GFR, EST AFRICAN AMERICAN: 25 mL/min — AB (ref >60)
Glucose, Bld: 72 mg/dL (ref 65–99)
POTASSIUM: 3.5 mmol/L (ref 3.5–5.1)
SODIUM: 136 mmol/L (ref 135–145)
Total Bilirubin: 1.4 mg/dL — ABNORMAL HIGH (ref 0.3–1.2)
Total Protein: 7.6 g/dL (ref 6.5–8.1)

## 2017-12-04 LAB — URINALYSIS, ROUTINE W REFLEX MICROSCOPIC
BILIRUBIN URINE: NEGATIVE
GLUCOSE, UA: NEGATIVE mg/dL
HGB URINE DIPSTICK: NEGATIVE
KETONES UR: NEGATIVE mg/dL
LEUKOCYTES UA: NEGATIVE
NITRITE: NEGATIVE
PH: 5 (ref 5.0–8.0)
Protein, ur: 30 mg/dL — AB
SPECIFIC GRAVITY, URINE: 1.01 (ref 1.005–1.030)

## 2017-12-04 LAB — GLUCOSE, CAPILLARY
GLUCOSE-CAPILLARY: 131 mg/dL — AB (ref 65–99)
GLUCOSE-CAPILLARY: 60 mg/dL — AB (ref 65–99)
GLUCOSE-CAPILLARY: 106 mg/dL — AB (ref 65–99)
GLUCOSE-CAPILLARY: 113 mg/dL — AB (ref 65–99)
GLUCOSE-CAPILLARY: 100 mg/dL — AB (ref 65–99)
Glucose-Capillary: 89 mg/dL (ref 65–99)
Glucose-Capillary: 64 mg/dL — ABNORMAL LOW (ref 65–99)
Glucose-Capillary: 85 mg/dL (ref 65–99)
Glucose-Capillary: 61 mg/dL — ABNORMAL LOW (ref 65–99)

## 2017-12-04 LAB — PROTIME-INR
INR: 1.34
PROTHROMBIN TIME: 16.5 s — AB (ref 11.4–15.2)

## 2017-12-04 LAB — CBC WITH DIFFERENTIAL/PLATELET
BASOS ABS: 0 10*3/uL (ref 0.0–0.1)
Basophils Relative: 0 %
EOS ABS: 0 10*3/uL (ref 0.0–0.7)
EOS PCT: 0 %
HCT: 41 % (ref 36.0–46.0)
Hemoglobin: 13.2 g/dL (ref 12.0–15.0)
Lymphocytes Relative: 19 %
Lymphs Abs: 2.5 10*3/uL (ref 0.7–4.0)
MCH: 28.9 pg (ref 26.0–34.0)
MCHC: 32.2 g/dL (ref 30.0–36.0)
MCV: 89.9 fL (ref 78.0–100.0)
MONO ABS: 0.4 10*3/uL (ref 0.1–1.0)
Monocytes Relative: 3 %
Neutro Abs: 10.1 10*3/uL — ABNORMAL HIGH (ref 1.7–7.7)
Neutrophils Relative %: 78 %
PLATELETS: 168 10*3/uL (ref 150–400)
RBC: 4.56 MIL/uL (ref 3.87–5.11)
RDW: 16.2 % — AB (ref 11.5–15.5)
WBC: 13 10*3/uL — AB (ref 4.0–10.5)

## 2017-12-04 LAB — CBG MONITORING, ED
GLUCOSE-CAPILLARY: 100 mg/dL — AB (ref 65–99)
Glucose-Capillary: 90 mg/dL (ref 65–99)
Glucose-Capillary: 80 mg/dL (ref 65–99)

## 2017-12-04 LAB — I-STAT TROPONIN, ED: TROPONIN I, POC: 0.04 ng/mL (ref 0.00–0.08)

## 2017-12-04 LAB — HEMOGLOBIN A1C
HEMOGLOBIN A1C: 6.2 % — AB (ref 4.8–5.6)
Mean Plasma Glucose: 131.24 mg/dL

## 2017-12-04 LAB — INFLUENZA PANEL BY PCR (TYPE A & B)
INFLAPCR: POSITIVE — AB
Influenza B By PCR: NEGATIVE

## 2017-12-04 LAB — STREP PNEUMONIAE URINARY ANTIGEN: Strep Pneumo Urinary Antigen: NEGATIVE

## 2017-12-04 LAB — APTT: APTT: 43 s — AB (ref 24–36)

## 2017-12-04 LAB — LACTIC ACID, PLASMA
LACTIC ACID, VENOUS: 1.2 mmol/L (ref 0.5–1.9)
Lactic Acid, Venous: 1.2 mmol/L (ref 0.5–1.9)

## 2017-12-04 LAB — I-STAT CG4 LACTIC ACID, ED: Lactic Acid, Venous: 0.75 mmol/L (ref 0.5–1.9)

## 2017-12-04 LAB — MRSA PCR SCREENING: MRSA by PCR: NEGATIVE

## 2017-12-04 LAB — PROCALCITONIN: PROCALCITONIN: 0.8 ng/mL

## 2017-12-04 MED ORDER — ACETAMINOPHEN 325 MG PO TABS: 975.0000 mg | ORAL_TABLET | Freq: Four times a day (QID) | ORAL | Status: DC | PRN

## 2017-12-04 MED ORDER — ALLOPURINOL 100 MG PO TABS
100.0000 mg | ORAL_TABLET | Freq: Every day | ORAL | Status: DC
  Administered 2017-12-04 – 2017-12-06 (×3): 100 mg via ORAL
  Filled 2017-12-04 (×4): qty 1

## 2017-12-04 MED ORDER — DEXTROSE-NACL 5-0.9 % IV SOLN
INTRAVENOUS | Status: DC
  Administered 2017-12-04 – 2017-12-05 (×3): via INTRAVENOUS

## 2017-12-04 MED ORDER — DEXTROSE 5 % IV SOLN
2.0000 g | Freq: Once | INTRAVENOUS | Status: AC
  Administered 2017-12-04: 2 g via INTRAVENOUS
  Filled 2017-12-04: qty 2

## 2017-12-04 MED ORDER — DOCUSATE SODIUM 100 MG PO CAPS
100.0000 mg | ORAL_CAPSULE | Freq: Two times a day (BID) | ORAL | Status: DC
  Administered 2017-12-04 – 2017-12-06 (×5): 100 mg via ORAL
  Filled 2017-12-04 (×6): qty 1

## 2017-12-04 MED ORDER — FAMOTIDINE 20 MG PO TABS
20.0000 mg | ORAL_TABLET | Freq: Every day | ORAL | Status: DC
  Administered 2017-12-04 – 2017-12-05 (×2): 20 mg via ORAL
  Filled 2017-12-04 (×2): qty 1

## 2017-12-04 MED ORDER — ONDANSETRON HCL 4 MG PO TABS: 4.0000 mg | ORAL_TABLET | Freq: Four times a day (QID) | ORAL | Status: DC | PRN

## 2017-12-04 MED ORDER — IPRATROPIUM-ALBUTEROL 0.5-2.5 (3) MG/3ML IN SOLN
3.0000 mL | Freq: Four times a day (QID) | RESPIRATORY_TRACT | Status: DC
  Filled 2017-12-04: qty 3

## 2017-12-04 MED ORDER — IPRATROPIUM-ALBUTEROL 0.5-2.5 (3) MG/3ML IN SOLN
3.0000 mL | Freq: Four times a day (QID) | RESPIRATORY_TRACT | Status: DC | PRN
  Administered 2017-12-06: 3 mL via RESPIRATORY_TRACT
  Filled 2017-12-04: qty 3

## 2017-12-04 MED ORDER — DEXTROSE 50 % IV SOLN
INTRAVENOUS | Status: AC
  Administered 2017-12-04: 50 mL
  Filled 2017-12-04: qty 50

## 2017-12-04 MED ORDER — VANCOMYCIN HCL 10 G IV SOLR: 1250.0000 mg | INTRAVENOUS | Status: DC

## 2017-12-04 MED ORDER — SODIUM CHLORIDE 0.9 % IV SOLN
1250.0000 mg | INTRAVENOUS | Status: DC
  Filled 2017-12-04: qty 1250

## 2017-12-04 MED ORDER — SODIUM CHLORIDE 0.9 % IV BOLUS (SEPSIS): 500.0000 mL | Freq: Once | INTRAVENOUS | Status: DC

## 2017-12-04 MED ORDER — IPRATROPIUM-ALBUTEROL 0.5-2.5 (3) MG/3ML IN SOLN
3.0000 mL | RESPIRATORY_TRACT | Status: DC
  Administered 2017-12-04: 3 mL via RESPIRATORY_TRACT
  Filled 2017-12-04: qty 3

## 2017-12-04 MED ORDER — ONDANSETRON HCL 4 MG/2ML IJ SOLN: 4.0000 mg | Freq: Four times a day (QID) | INTRAMUSCULAR | Status: DC | PRN

## 2017-12-04 MED ORDER — COLCHICINE 0.6 MG PO TABS: 0.6000 mg | ORAL_TABLET | Freq: Every day | ORAL | Status: DC | PRN

## 2017-12-04 MED ORDER — SODIUM CHLORIDE 0.9 % IV BOLUS (SEPSIS)
1000.0000 mL | Freq: Once | INTRAVENOUS | Status: AC
  Administered 2017-12-04: 1000 mL via INTRAVENOUS

## 2017-12-04 MED ORDER — DEXTROSE 5 % IV SOLN
2.0000 g | INTRAVENOUS | Status: DC
  Administered 2017-12-05: 2 g via INTRAVENOUS
  Filled 2017-12-04 (×3): qty 2

## 2017-12-04 MED ORDER — ALBUTEROL SULFATE (2.5 MG/3ML) 0.083% IN NEBU
5.0000 mg | INHALATION_SOLUTION | Freq: Once | RESPIRATORY_TRACT | Status: AC
  Administered 2017-12-04: 5 mg via RESPIRATORY_TRACT
  Filled 2017-12-04: qty 6

## 2017-12-04 MED ORDER — APIXABAN 5 MG PO TABS
5.0000 mg | ORAL_TABLET | Freq: Two times a day (BID) | ORAL | Status: DC
  Administered 2017-12-04 – 2017-12-06 (×5): 5 mg via ORAL
  Filled 2017-12-04 (×6): qty 1

## 2017-12-04 MED ORDER — VANCOMYCIN HCL IN DEXTROSE 1-5 GM/200ML-% IV SOLN
1000.0000 mg | Freq: Once | INTRAVENOUS | Status: AC
  Administered 2017-12-04: 1000 mg via INTRAVENOUS
  Filled 2017-12-04: qty 200

## 2017-12-04 MED ORDER — PRAVASTATIN SODIUM 40 MG PO TABS
80.0000 mg | ORAL_TABLET | Freq: Every day | ORAL | Status: DC
  Administered 2017-12-04 – 2017-12-05 (×2): 80 mg via ORAL
  Filled 2017-12-04 (×4): qty 2

## 2017-12-04 MED ORDER — INSULIN ASPART 100 UNIT/ML ~~LOC~~ SOLN
0.0000 [IU] | Freq: Three times a day (TID) | SUBCUTANEOUS | Status: DC
  Administered 2017-12-05: 1 [IU] via SUBCUTANEOUS
  Administered 2017-12-05: 2 [IU] via SUBCUTANEOUS
  Administered 2017-12-06 (×2): 3 [IU] via SUBCUTANEOUS

## 2017-12-04 MED ORDER — OSELTAMIVIR PHOSPHATE 30 MG PO CAPS
30.0000 mg | ORAL_CAPSULE | Freq: Two times a day (BID) | ORAL | Status: DC
  Administered 2017-12-04 – 2017-12-06 (×5): 30 mg via ORAL
  Filled 2017-12-04 (×6): qty 1

## 2017-12-04 MED ORDER — SODIUM CHLORIDE 0.9 % IV SOLN
INTRAVENOUS | Status: DC
  Administered 2017-12-04: 500 mL via INTRAVENOUS

## 2017-12-04 NOTE — Plan of Care (Signed)
Discussed with pt and family risk of transmitting the flu and make should be worn.  Family refuses to wear mask as a precaution. Will continue to educate on how influenza is spread.

## 2017-12-04 NOTE — ED Triage Notes (Signed)
Pt BIB [facility]. On arrival patient was unresponsive and had a glucose of 52. 250cc of d10 and patient is alert and oriented. Last CBG was 134 per EMS. Pt also c/o a productive cough and wanted it to be evaluated.

## 2017-12-04 NOTE — H&P (Signed)
History and Physical    Kathleen [name] EGB:[id number] DOB: 05-Feb-1946 DOA: 12/04/2017  PCP: Levin Erp, MD Patient coming from: home  Chief Complaint: unresponisveness  HPI: Kathleen [name] is a delightful 71 y.o. female with medical history significant A. fib on anticoagulation, hypertension, non-insulin-dependent diabetes, CAD status post stent, chronic heart failure presents to the emergency department with the chief complaint of unresponsiveness. Initial evaluation revealed hypoglycemia hypothermia leukocytosis, mild hypotension mild tachypnea and 30. [facility] are asked to admit  Information is obtained from the patient and the chart and brother who is at the bedside. Patient reports she returned from a cruise 4 days ago. She states many passengers were sick. Family indicate he should found unresponsive 2:30 AM. Chart indicates patient arrived to the emergency department via EMS at 4 AM. There is no report of any fever chills nausea vomiting diarrhea. Patient denied numbness tingling of extremities. She reports a persistent "cold" with a cough that causes her chest hurt. She reports she is coughing thick dark sputum. Reports some mild shortness of breath. She denies headache dizziness syncope or near-syncope. She denies lower extremity edema or orthopnea.    ED Course: The emergency department she is hypothermic with a soft blood pressure mild tachypnea quite lethargic. She is also hypoglycemic. She is provided with IV fluids and an amp of D50. At the time of admission she is drowsy but responds to verbal stimuli answers questions appropriately and follows commands  Review of Systems: As per HPI otherwise all other systems reviewed and are negative.   Ambulatory Status: Ambulates independently is independent with ADLs  Past Medical History:  Diagnosis Date  . Arthritis   . Blood transfusion    no side affects  . CHF (congestive heart failure) ([place])   . Coronary artery  disease    Cypher stent to the RCA in 2003  . Diabetes mellitus    Borderline  . GERD (gastroesophageal reflux disease)   . Hypertension   . Shortness of breath     Past Surgical History:  Procedure Laterality Date  . CARDIOVERSION N/A 03/17/2015   Procedure: CARDIOVERSION;  Surgeon: Sueanne Margarita, MD;  Location: [facility];  Service: Cardiovascular;  Laterality: N/A;  . CHOLECYSTECTOMY    . CORONARY ANGIOPLASTY WITH STENT PLACEMENT    . TOTAL KNEE ARTHROPLASTY      Social History   Socioeconomic History  . Marital status: Divorced    Spouse name: Not on file  . Number of children: Not on file  . Years of education: Not on file  . Highest education level: Not on file  Social Needs  . Financial resource strain: Not on file  . Food insecurity - worry: Not on file  . Food insecurity - inability: Not on file  . Transportation needs - medical: Not on file  . Transportation needs - non-medical: Not on file  Occupational History  . Not on file  Tobacco Use  . Smoking status: Current Every Day Smoker    Packs/day: 0.30    Years: 30.00    Pack years: 9.00    Types: Cigarettes    Last attempt to quit: 01/18/2014    Years since quitting: 3.8  . Smokeless tobacco: Never Used  . Tobacco comment: Smoking cessation requested   Substance and Sexual Activity  . Alcohol use: No  . Drug use: No  . Sexual activity: Not Currently    Birth control/protection: Post-menopausal  Other Topics Concern  . Not on file  Social History Narrative  . Not on file    Allergies  Allergen Reactions  . Penicillins Other (See Comments)    unknown    Family History  Problem Relation Age of Onset  . Hypertension Father   . Heart disease Father   . Gout Father   . Arthritis Father   . Heart attack Father   . Heart attack Brother   . Hypertension Brother   . Hypertension Sister   . Stroke Neg Hx     Prior to Admission medications   Medication Sig Start Date End Date Taking? Authorizing  Provider  acetaminophen (TYLENOL) 325 MG tablet Take 975 mg by mouth every 6 (six) hours as needed (pain).    [provider]  albuterol (PROVENTIL HFA;VENTOLIN HFA) 108 (90 BASE) MCG/ACT inhaler Inhale 1-2 puffs into the lungs every 6 (six) hours as needed for wheezing or shortness of breath. 10/16/13   Janne Napoleon, NP  allopurinol (ZYLOPRIM) 100 MG tablet Take 100 mg by mouth daily.    [provider]  colchicine 0.6 MG tablet Take 0.6 mg by mouth daily as needed (for gout flare ups).     [provider]  cyclobenzaprine (FLEXERIL) 10 MG tablet Take 1 tablet (10 mg total) by mouth 2 (two) times daily as needed for muscle spasms. 08/15/15   Noemi Chapel, MD  ELIQUIS 5 MG TABS tablet take 1 tablet by mouth twice a day 07/13/16   Minus Breeding, MD  famotidine (PEPCID) 20 MG tablet Take 20 mg by mouth at bedtime.     [provider]  furosemide (LASIX) 80 MG tablet Take 80 mg by mouth daily. 03/14/15   [provider]  glipiZIDE (GLUCOTROL) 5 MG tablet Take 1 tablet (5 mg total) by mouth 2 (two) times daily before a meal. 06/19/14   Kindl, Nelda Severe, MD  irbesartan (AVAPRO) 75 MG tablet Take 1 tablet (75 mg total) by mouth daily. 03/11/15   Minus Breeding, MD  isosorbide-hydrALAZINE (BIDIL) 20-37.5 MG per tablet Take 1 tablet by mouth 3 (three) times daily. 03/10/15   Minus Breeding, MD  metoprolol tartrate (LOPRESSOR) 25 MG tablet take 3 tablets by mouth twice a day 10/10/17   Minus Breeding, MD  naproxen (NAPROSYN) 500 MG tablet Take 1 tablet (500 mg total) by mouth 2 (two) times daily with a meal. 08/15/15   Noemi Chapel, MD  pravastatin (PRAVACHOL) 80 MG tablet Take 1 tablet (80 mg total) by mouth daily. 11/23/11 09/29/15  Minus Breeding, MD    Physical Exam: Vitals:   12/04/17 0730 12/04/17 0745 12/04/17 0831 12/04/17 0856  BP: 106/61 116/74 (!) 120/57   Pulse: 86 81 87   Resp: 17 20 (!) 24   Temp:   (!) 96.1 F (35.6 C)   TempSrc:   Rectal   SpO2:  94% 96% 94% 95%     General:  Appears lying flat in bed with bear huggor on in no acute distress Eyes:  PERRL, EOMI, normal lids, iris ENT:  grossly normal hearing, lips & tongue, because membranes of her mouth are pink slightly dry Neck:  no LAD, masses or thyromegaly Cardiovascular:  Irregularly irregular I hear no murmur gallop or rub Respiratory:  Normal effort. Respirations slightly shallow. Breath sounds are fairly coarse throughout with faint wheezing. Moist sounding cough during exam. Week cough effort Abdomen:  soft, ntnd, NABS Skin:  no rash or induration seen on limited exam Musculoskeletal:  grossly normal tone BUE/BLE, good ROM, no bony  abnormality Psychiatric:  grossly normal mood and affect, speech fluent and appropriate, AOx3 Neurologic:  Somewhat drowsy but responds to verbal stimuli. Oriented to self and place. Follows commands. Answers questions appropriately. Moving all extremities spontaneously.  Labs on Admission: I have personally reviewed following labs and imaging studies  CBC: Recent Labs  Lab 12/04/17 0425  WBC 13.0*  NEUTROABS 10.1*  HGB 13.2  HCT 41.0  MCV 89.9  PLT 791   Basic Metabolic Panel: Recent Labs  Lab 12/04/17 0425  NA 136  K 3.5  CL 101  CO2 26  GLUCOSE 72  BUN 66*  CREATININE 2.13*  CALCIUM 8.7*   GFR: CrCl cannot be calculated (Unknown ideal weight.). Liver Function Tests: Recent Labs  Lab 12/04/17 0425  AST 72*  ALT 56*  ALKPHOS 92  BILITOT 1.4*  PROT 7.6  ALBUMIN 3.4*   No results for input(s): LIPASE, AMYLASE in the last 168 hours. No results for input(s): AMMONIA in the last 168 hours. Coagulation Profile: No results for input(s): INR, PROTIME in the last 168 hours. Cardiac Enzymes: No results for input(s): CKTOTAL, CKMB, CKMBINDEX, TROPONINI in the last 168 hours. BNP (last 3 results) No results for input(s): PROBNP in the last 8760 hours. HbA1C: No results for input(s): HGBA1C in the last 72  hours. CBG: Recent Labs  Lab 12/04/17 0410 12/04/17 0609 12/04/17 0739  GLUCAP 90 80 100*   Lipid Profile: No results for input(s): CHOL, HDL, LDLCALC, TRIG, CHOLHDL, LDLDIRECT in the last 72 hours. Thyroid Function Tests: No results for input(s): TSH, T4TOTAL, FREET4, T3FREE, THYROIDAB in the last 72 hours. Anemia Panel: No results for input(s): VITAMINB12, FOLATE, FERRITIN, TIBC, IRON, RETICCTPCT in the last 72 hours. Urine analysis:    Component Value Date/Time   COLORURINE YELLOW 12/04/2017 0500   APPEARANCEUR CLEAR 12/04/2017 0500   LABSPEC 1.010 12/04/2017 0500   PHURINE 5.0 12/04/2017 0500   GLUCOSEU NEGATIVE 12/04/2017 0500   HGBUR NEGATIVE 12/04/2017 0500   BILIRUBINUR NEGATIVE 12/04/2017 0500   KETONESUR NEGATIVE 12/04/2017 0500   PROTEINUR 30 (A) 12/04/2017 0500   UROBILINOGEN 1.0 08/15/2015 0915   NITRITE NEGATIVE 12/04/2017 0500   LEUKOCYTESUR NEGATIVE 12/04/2017 0500    Creatinine Clearance: CrCl cannot be calculated (Unknown ideal weight.).  Sepsis Labs: @LABRCNTIP (procalcitonin:4,lacticidven:4) )No results found for this or any previous visit (from the past 240 hour(s)).   Radiological Exams on Admission: Dg Chest 2 View  Result Date: 12/04/2017 CLINICAL DATA:  Productive cough EXAM: CHEST  2 VIEW COMPARISON:  Chest radiograph 01/25/2015 FINDINGS: There is mild cardiomegaly, unchanged. Pulmonary vascular congestion without overt edema. No focal consolidation. No pleural effusion or pneumothorax. IMPRESSION: Cardiomegaly and pulmonary vascular congestion without overt edema. Electronically Signed   By: Ulyses Jarred M.D.   On: 12/04/2017 05:02    EKG: Independently reviewed. Atrial fibrillation Anterior infarct, o Assessment/Plan Principal Problem:   SIRS (systemic inflammatory response syndrome) (HCC) Active Problems:   OBESITY   Essential hypertension   Diabetes mellitus (HCC)   Chronic kidney disease, stage III (moderate) (HCC)   Paroxysmal  atrial fibrillation (HCC)   Chronic diastolic CHF (congestive heart failure) ([place])   Type 2 diabetes mellitus with hypoglycemia without coma (HCC)   Hypothermia   Elevated LFTs   #1. SIRS. Patient with hypothermia, hypoglycemia, soft blood pressure, tachypnea, leukocytosis quite lethargic. Source unclear. Actiq acid within the limits of normal. Chest x-ray reveals cardiomegaly and pulmonary vascular congestion without overt edema. Urinalysis unremarkable. He is provided with IV fluids and antibiotics  and D50 in the emergency department. Improving on admission. Suspect respiratory source -Admit to step down -Continue gentle IV fluids -Continue broad-spectrum antibiotics -Follow blood cultures -Sputum culture as able -strep pneumonia urine antigen -track lactic acid -Influenza panel -obtain procalcitonin -bare hugger as indicated -close cbg monitoring  #2. Hypertension. Blood pressure somewhat soft in the emergency department. Improving on admission. Home medications include Lasix, Avapro,bidil, and metoprolol. -We'll hold these for now -Monitor closely -Resume home meds as indicated  3. Chronic diastolic heart failure. Home medications include Lasix and metoprolol. Echo 2016 reveals an EF of 55-60%, no wall motion abnormalities grade 2 diastolic dysfunction. EKG as noted above. Chest x-ray with pulmonary vascular congestion without over edema -Strict intake and output -Daily weights -Holding Lasix for now as noted above -Resume home meds as indicated  #4. A. Fib. Rate controlled. chadscore 5. Home meds include metroprolol eliquis -will resume metoprolol -Continue eliquis -monitor  #5.elevated LFTs. ast and alt slightly elevated. Total bilirubin 1.4. -monito -OP follow up  #6. Diabetes. Diet controlled. Patient was hypoglycemic upon presentation. Likely related to above. She was provided with an amp of D50. Capillary blood sugars trending up on admission. Recent 110 -Monitor  capillary blood sugars -Encourage eating     DVT prophylaxis: eliquis  Code Status: full  Family Communication: brother at bedside  Disposition Plan: home hopefully 2-3 days  Consults called: none  Admission status: inpatient    [name] MD [facility]  If 7PM-7AM, please contact night-coverage [email or web address] Password [email or web address]  12/04/2017, 9:06 AM

## 2017-12-04 NOTE — Progress Notes (Signed)
Pharmacy Antibiotic Note  Kathleen [name] is a 71 y.o. female admitted on 12/04/2017 with sepsis.  Pharmacy has been consulted for Vancomycin and Cefepime dosing. Note PCN allergy with unknown reaction. Patient has tolerated Cephalosporins in past stays. Patient received Cefepime 2g IV x1 at 0645 AM and Vancomycin 1g IV x1 at 0630AM. WBC is 13, ANC 10.1. Patient has low temp of 94.3 on admission. She is obese with last weight in chart from 09/2016 of 114.2 kg (~250 lbs). RN estimates to be the same. Will re-weigh patient.  Her SCr is 2.13 (this is about baseline for this patient) with normalized CrCl ~ 30 ml/min.   Plan: Continue Cefepime 2g IV every 24 hours.  Give Vancomycin extra 1g IV now (to equal total loading dose of 2g, plan discussed with RN), then 1250mg  IV q24. Monitor VT at steady state as appropriate. Monitor renal function, clinical status, and culture results. Verify height and weight and adjust doses as needed.      Temp (24hrs), Avg:94.3 F (34.6 C), Min:94.3 F (34.6 C), Max:94.3 F (34.6 C)  Recent Labs  Lab 12/04/17 0425 12/04/17 0432  WBC 13.0*  --   CREATININE 2.13*  --   LATICACIDVEN  --  0.75    CrCl cannot be calculated (Unknown ideal weight.).    Allergies  Allergen Reactions  . Penicillins Other (See Comments)    unknown    Antimicrobials this admission: Vancomycin 12/23 >> Cefepime 12/23 >>  Dose adjustments this admission:   Microbiology results: 12/23 BCx:  12/23 Sputum:  12/23 MRSA PCR:   Thank you for allowing pharmacy to be a part of this patient's care.  Sloan Leiter, PharmD, BCPS, BCCCP Clinical Pharmacist Clinical phone 12/04/2017 until 3:30PM [phone] After hours, please call #[phone] 12/04/2017 8:03 AM

## 2017-12-04 NOTE — ED Notes (Signed)
Delay in 2nd blood culture,  Pt not in room at this time.

## 2017-12-04 NOTE — Progress Notes (Signed)
CRITICAL VALUE ALERT  Critical Value: CBG: 61  Date & Time Notied: 12/04/17 2000  Provider Notified: RN   Orders Received/Actions taken: Pt given orange juice

## 2017-12-04 NOTE — ED Notes (Signed)
Kathleen [name] [phone]

## 2017-12-04 NOTE — ED Notes (Signed)
Admitting Provider at bedside. 

## 2017-12-04 NOTE — Progress Notes (Signed)
Pharmacy called to add 1g of Vancomycin due to pt's weight. Will hang med and continue monitoring pt.

## 2017-12-04 NOTE — Progress Notes (Signed)
CRITICAL VALUE ALERT  Critical Value: CBG: 60  Date & Time Notied: 12/04/17 2045  Provider Notified: RN  Orders Received/Actions taken: Pt given two orange juices and an amp of D50 was give by Wynonia Hazard, RN

## 2017-12-04 NOTE — ED Provider Notes (Addendum)
TIME SEEN: 4:26 AM  CHIEF COMPLAINT: Found unresponsive  HPI: Patient is a 71 year old female with history of non-insulin-dependent diabetes, hypertension, CAD status post stent, CHF who presents to the emergency department with complaints of found unresponsive.  Patient states that "my blood sugar got too low".  Patient's family are unfortunately extremely poor historians.  They think that they last saw her normal around 1 AM in the back to check on her at 2:30 AM and she was unresponsive.  She did not arrived to the emergency department however with EMS until 4 AM.  Currently she has no focal neurologic deficits.  She denies numbness, tingling or focal weakness.  She does state that she has had low-grade fevers of 100.4 over the past several days, productive cough, wheezing.  States that she just returned from a cruise from the [place] on Thursday, December 20.  No other recent international travel.  No known sick contacts.  She has had an influenza vaccination this year.  She denies any vomiting but states she has had increased loose stools but does not states that they are diarrhea.  No abdominal pain.  No headache or head injury.  ROS: See HPI Constitutional: no fever  Eyes: no drainage  ENT: no runny nose   Cardiovascular:  no chest pain  Resp: no SOB  GI: no vomiting GU: no dysuria Integumentary: no rash  Allergy: no hives  Musculoskeletal: no leg swelling  Neurological: no slurred speech ROS otherwise negative  PAST MEDICAL HISTORY/PAST SURGICAL HISTORY:  Past Medical History:  Diagnosis Date  . Arthritis   . Blood transfusion    no side affects  . CHF (congestive heart failure) ([place])   . Coronary artery disease    Cypher stent to the RCA in 2003  . Diabetes mellitus    Borderline  . GERD (gastroesophageal reflux disease)   . Hypertension   . Shortness of breath     MEDICATIONS:  Prior to Admission medications   Medication Sig Start Date End Date Taking? Authorizing  Provider  acetaminophen (TYLENOL) 325 MG tablet Take 975 mg by mouth every 6 (six) hours as needed (pain).    [provider]  albuterol (PROVENTIL HFA;VENTOLIN HFA) 108 (90 BASE) MCG/ACT inhaler Inhale 1-2 puffs into the lungs every 6 (six) hours as needed for wheezing or shortness of breath. 10/16/13   Janne Napoleon, NP  allopurinol (ZYLOPRIM) 100 MG tablet Take 100 mg by mouth daily.    [provider]  colchicine 0.6 MG tablet Take 0.6 mg by mouth daily as needed (for gout flare ups).     [provider]  cyclobenzaprine (FLEXERIL) 10 MG tablet Take 1 tablet (10 mg total) by mouth 2 (two) times daily as needed for muscle spasms. 08/15/15   Noemi Chapel, MD  ELIQUIS 5 MG TABS tablet take 1 tablet by mouth twice a day 07/13/16   Minus Breeding, MD  famotidine (PEPCID) 20 MG tablet Take 20 mg by mouth at bedtime.     [provider]  furosemide (LASIX) 80 MG tablet Take 80 mg by mouth daily. 03/14/15   [provider]  glipiZIDE (GLUCOTROL) 5 MG tablet Take 1 tablet (5 mg total) by mouth 2 (two) times daily before a meal. 06/19/14   Kindl, Nelda Severe, MD  irbesartan (AVAPRO) 75 MG tablet Take 1 tablet (75 mg total) by mouth daily. 03/11/15   Minus Breeding, MD  isosorbide-hydrALAZINE (BIDIL) 20-37.5 MG per tablet Take 1 tablet by mouth 3 (three)  times daily. 03/10/15   Minus Breeding, MD  metoprolol tartrate (LOPRESSOR) 25 MG tablet take 3 tablets by mouth twice a day 10/10/17   Minus Breeding, MD  naproxen (NAPROSYN) 500 MG tablet Take 1 tablet (500 mg total) by mouth 2 (two) times daily with a meal. 08/15/15   Noemi Chapel, MD  pravastatin (PRAVACHOL) 80 MG tablet Take 1 tablet (80 mg total) by mouth daily. 11/23/11 09/29/15  Minus Breeding, MD    ALLERGIES:  Allergies  Allergen Reactions  . Penicillins Other (See Comments)    unknown    SOCIAL HISTORY:  Social History   Tobacco Use  . Smoking status: Current Every Day Smoker    Packs/day: 0.30     Years: 30.00    Pack years: 9.00    Types: Cigarettes    Last attempt to quit: 01/18/2014    Years since quitting: 3.8  . Smokeless tobacco: Never Used  . Tobacco comment: Smoking cessation requested   Substance Use Topics  . Alcohol use: No    FAMILY HISTORY: Family History  Problem Relation Age of Onset  . Hypertension Father   . Heart disease Father   . Gout Father   . Arthritis Father   . Heart attack Father   . Heart attack Brother   . Hypertension Brother   . Hypertension Sister   . Stroke Neg Hx     EXAM: BP 137/73   Pulse 77   Resp (!) 23   SpO2 98% Rectal 94.7 CONSTITUTIONAL: Alert and oriented x3 and responds appropriately to questions.  Elderly, in no distress, cool to touch HEAD: Normocephalic EYES: Conjunctivae clear, pupils appear equal, EOMI ENT: normal nose; moist mucous membranes NECK: Supple, no meningismus, no nuchal rigidity, no LAD  CARD: RRR; S1 and S2 appreciated; no murmurs, no clicks, no rubs, no gallops RESP: Normal chest excursion without splinting or tachypnea; breath sounds equal bilaterally but she does have diffuse expiratory wheezes and some scattered rhonchi, no rales, no hypoxia or respiratory distress, speaking full sentences ABD/GI: Normal bowel sounds; non-distended; soft, non-tender, no rebound, no guarding, no peritoneal signs, no hepatosplenomegaly BACK:  The back appears normal and is non-tender to palpation, there is no CVA tenderness EXT: Normal ROM in all joints; non-tender to palpation; no edema; normal capillary refill; no cyanosis, no calf tenderness or swelling    SKIN: Normal color for age and race; warm; no rash NEURO: Moves all extremities equally, sensation to light touch intact diffusely, cranial nerves II through XII intact, normal speech PSYCH: The patient's mood and manner are appropriate. Grooming and personal hygiene are appropriate.  MEDICAL DECISION MAKING: Patient here after she was found unresponsive.  Found to be  hypoglycemic with a blood sugar in the 50s per nursing staff.  EMS gave her 250 cc of D10 and blood blood glucose improved to 134.  Here it is 90.  She is hypothermic here with a rectal temp of 94.3.  Bair hugger placed on patient.  This could be from being down for an extended period of time secondary to hypoglycemia but this also could be from infection.  Will obtain labs, lactate, cultures, chest x-ray, urine.  Will be cautious with IV fluids given her history of CHF.  Currently blood pressure is normal.  I suspect patient will need admission.  ED PROGRESS: Patient's blood sugar has dropped into the 80s despite drinking juice and soda.  I do not feel she needs to be started on a dextrose drip yet  but she will need to be monitored closely.  Her white blood cell count is elevated with a left shift and she has now had a slight drop in her blood pressure.  Lactate is normal but because of her hypothermia, leukocytosis and recent complaints of infectious symptoms I will cover her broadly with vancomycin and cefepime.  She is allergic to penicillin.  Her chest x-ray shows no sign of pneumonia.  Her urine shows no sign of infection.  I have recommended admission and patient and family are comfortable with this plan.  Her PCP is Dr. Jeanie Cooks.       6:17 AM Discussed patient's case with hospitalist, Dr. Myna Hidalgo.  I have recommended admission and patient (and family if present) agree with this plan. Admitting physician will place admission orders.   I reviewed all nursing notes, vitals, pertinent previous records, EKGs, lab and urine results, imaging (as available).      EKG Interpretation  Date/Time:  Sunday December 04 2017 04:03:02 EST Ventricular Rate:  73 PR Interval:    QRS Duration: 88 QT Interval:  451 QTC Calculation: 497 R Axis:   86 Text Interpretation:  Atrial fibrillation Anterior infarct, old Baseline wander in lead(s) III A fib new compared to prior Confirmed by Epiphany Seltzer, Cyril Mourning [phone]) on  12/04/2017 6:28:16 AM          EKG Interpretation  Date/Time:  Sunday December 04 2017 04:10:42 EST Ventricular Rate:  70 PR Interval:    QRS Duration: 88 QT Interval:  434 QTC Calculation: 469 R Axis:   80 Text Interpretation:  Atrial fibrillation Anterior infarct, old Baseline wander in lead(s) I III aVL V2 Confirmed by Pryor Curia [phone]) on 12/04/2017 6:28:44 AM         CRITICAL CARE Performed by: Cyril Mourning Brentley Horrell   Total critical care time: 45 minutes  Critical care time was exclusive of separately billable procedures and treating other patients.  Critical care was necessary to treat or prevent imminent or life-threatening deterioration.  Critical care was time spent personally by me on the following activities: development of treatment plan with patient and/or surrogate as well as nursing, discussions with consultants, evaluation of patient's response to treatment, examination of patient, obtaining history from patient or surrogate, ordering and performing treatments and interventions, ordering and review of laboratory studies, ordering and review of radiographic studies, pulse oximetry and re-evaluation of patient's condition.    Ladawn Boullion, Delice Bison, DO 12/04/17 [place], Delice Bison, DO 12/04/17 [phone]

## 2017-12-05 ENCOUNTER — Inpatient Hospital Stay (HOSPITAL_COMMUNITY): Payer: Medicare HMO

## 2017-12-05 DIAGNOSIS — N184 Chronic kidney disease, stage 4 (severe): Secondary | ICD-10-CM

## 2017-12-05 DIAGNOSIS — I10 Essential (primary) hypertension: Secondary | ICD-10-CM

## 2017-12-05 DIAGNOSIS — E162 Hypoglycemia, unspecified: Secondary | ICD-10-CM

## 2017-12-05 DIAGNOSIS — E1122 Type 2 diabetes mellitus with diabetic chronic kidney disease: Secondary | ICD-10-CM

## 2017-12-05 DIAGNOSIS — R6510 Systemic inflammatory response syndrome (SIRS) of non-infectious origin without acute organ dysfunction: Secondary | ICD-10-CM

## 2017-12-05 DIAGNOSIS — T68XXXA Hypothermia, initial encounter: Secondary | ICD-10-CM

## 2017-12-05 DIAGNOSIS — I480 Paroxysmal atrial fibrillation: Secondary | ICD-10-CM

## 2017-12-05 DIAGNOSIS — I5032 Chronic diastolic (congestive) heart failure: Secondary | ICD-10-CM

## 2017-12-05 DIAGNOSIS — E11649 Type 2 diabetes mellitus with hypoglycemia without coma: Secondary | ICD-10-CM

## 2017-12-05 LAB — HEPATIC FUNCTION PANEL
ALBUMIN: 2.6 g/dL — AB (ref 3.5–5.0)
ALK PHOS: 89 U/L (ref 38–126)
ALT: 40 U/L (ref 14–54)
AST: 38 U/L (ref 15–41)
BILIRUBIN TOTAL: 0.7 mg/dL (ref 0.3–1.2)
Bilirubin, Direct: 0.3 mg/dL (ref 0.1–0.5)
Indirect Bilirubin: 0.4 mg/dL (ref 0.3–0.9)
Total Protein: 6.1 g/dL — ABNORMAL LOW (ref 6.5–8.1)

## 2017-12-05 LAB — CBC
HCT: 35.9 % — ABNORMAL LOW (ref 36.0–46.0)
Hemoglobin: 11.3 g/dL — ABNORMAL LOW (ref 12.0–15.0)
MCH: 28.5 pg (ref 26.0–34.0)
MCHC: 31.5 g/dL (ref 30.0–36.0)
MCV: 90.4 fL (ref 78.0–100.0)
PLATELETS: 146 10*3/uL — AB (ref 150–400)
RBC: 3.97 MIL/uL (ref 3.87–5.11)
RDW: 16.1 % — AB (ref 11.5–15.5)
WBC: 9.1 10*3/uL (ref 4.0–10.5)

## 2017-12-05 LAB — BASIC METABOLIC PANEL
Anion gap: 8 (ref 5–15)
BUN: 55 mg/dL — AB (ref 6–20)
CHLORIDE: 104 mmol/L (ref 101–111)
CO2: 24 mmol/L (ref 22–32)
CREATININE: 1.97 mg/dL — AB (ref 0.44–1.00)
Calcium: 8 mg/dL — ABNORMAL LOW (ref 8.9–10.3)
GFR calc Af Amer: 28 mL/min — ABNORMAL LOW (ref >60)
GFR calc non Af Amer: 24 mL/min — ABNORMAL LOW (ref >60)
Glucose, Bld: 139 mg/dL — ABNORMAL HIGH (ref 65–99)
Potassium: 3.6 mmol/L (ref 3.5–5.1)
Sodium: 136 mmol/L (ref 135–145)

## 2017-12-05 LAB — GLUCOSE, CAPILLARY
GLUCOSE-CAPILLARY: 104 mg/dL — AB (ref 65–99)
GLUCOSE-CAPILLARY: 114 mg/dL — AB (ref 65–99)
GLUCOSE-CAPILLARY: 114 mg/dL — AB (ref 65–99)
GLUCOSE-CAPILLARY: 116 mg/dL — AB (ref 65–99)
GLUCOSE-CAPILLARY: 146 mg/dL — AB (ref 65–99)
GLUCOSE-CAPILLARY: 93 mg/dL (ref 65–99)
Glucose-Capillary: 99 mg/dL (ref 65–99)
Glucose-Capillary: 156 mg/dL — ABNORMAL HIGH (ref 65–99)
Glucose-Capillary: 134 mg/dL — ABNORMAL HIGH (ref 65–99)
Glucose-Capillary: 107 mg/dL — ABNORMAL HIGH (ref 65–99)
Glucose-Capillary: 119 mg/dL — ABNORMAL HIGH (ref 65–99)
Glucose-Capillary: 200 mg/dL — ABNORMAL HIGH (ref 65–99)
Glucose-Capillary: 255 mg/dL — ABNORMAL HIGH (ref 65–99)

## 2017-12-05 LAB — MAGNESIUM: Magnesium: 2.1 mg/dL (ref 1.7–2.4)

## 2017-12-05 MED ORDER — METOPROLOL TARTRATE 50 MG PO TABS
75.0000 mg | ORAL_TABLET | Freq: Two times a day (BID) | ORAL | Status: DC
  Administered 2017-12-05 – 2017-12-06 (×3): 75 mg via ORAL
  Filled 2017-12-05 (×3): qty 1

## 2017-12-05 MED ORDER — FUROSEMIDE 80 MG PO TABS
80.0000 mg | ORAL_TABLET | Freq: Every day | ORAL | Status: DC
  Administered 2017-12-05 – 2017-12-06 (×2): 80 mg via ORAL
  Filled 2017-12-05 (×2): qty 1

## 2017-12-05 NOTE — Evaluation (Signed)
Physical Therapy Evaluation Patient Details Name: Kathleen [name] MRN: [id number] DOB: [birth year] Today's Date: 12/05/2017   History of Present Illness  Kathleen [name] is a 71 y.o. female with medical history significant A. fib on anticoagulation, hypertension, non-insulin-dependent diabetes, CAD status post stent, chronic heart failure. Patient presented secondary to unresponsiveness and met SIRS criteria on admission; found to have influenza A infection. She required Bair hugger for hypothermia which has now resolved. Empiric antibiotics for ?pneumonia.   Clinical Impression  Pt admitted with above diagnosis. Pt currently with functional limitations due to the deficits listed below (see PT Problem List). Pt was able to stand pivot to chair with min guard asssit.  Did not feel up to walking further. Should progress well.  Will follow acutely. Pt will benefit from skilled PT to increase their independence and safety with mobility to allow discharge to the venue listed below.      Follow Up Recommendations Home health PT;Supervision/Assistance - 24 hour    Equipment Recommendations  None recommended by PT    Recommendations for Other Services       Precautions / Restrictions Precautions Precautions: Fall Precaution Comments: DROPLET Restrictions Weight Bearing Restrictions: No      Mobility  Bed Mobility Overal bed mobility: Independent                Transfers Overall transfer level: Needs assistance Equipment used: None Transfers: Sit to/from Stand;Stand Pivot Transfers Sit to Stand: Supervision Stand pivot transfers: Min guard       General transfer comment: Incr time to come to stand but no assist needed.  Min guard assist to steady with stand pivot transfer.  Ambulation/Gait                Stairs            Wheelchair Mobility    Modified Rankin (Stroke Patients Only)       Balance Overall balance assessment: Needs  assistance Sitting-balance support: No upper extremity supported;Feet supported Sitting balance-Leahy Scale: Good     Standing balance support: Bilateral upper extremity supported;During functional activity Standing balance-Leahy Scale: Fair Standing balance comment: can stand statically without UE support                              Pertinent Vitals/Pain Pain Assessment: No/denies pain  VSS  Home Living Family/patient expects to be discharged to:: Private residence Living Arrangements: Alone Available Help at Discharge: Family;Available 24 hours/day;Personal care attendant(aide 7 days week, 2 hours day) Type of Home: House Home Access: Level entry     Home Layout: One level Home Equipment: Cane - single point;Shaver - 4 wheels;Shower seat      Prior Function Level of Independence: Needs assistance   Gait / Transfers Assistance Needed: used cane and rollator in community  ADL's / Homemaking Assistance Needed: Aide assists pt 2 hours day  Comments: Per pt family can assist prn     Hand Dominance   Dominant Hand: Right    Extremity/Trunk Assessment   Upper Extremity Assessment Upper Extremity Assessment: Defer to OT evaluation    Lower Extremity Assessment Lower Extremity Assessment: Generalized weakness    Cervical / Trunk Assessment Cervical / Trunk Assessment: Normal  Communication   Communication: No difficulties  Cognition Arousal/Alertness: Awake/alert Behavior During Therapy: WFL for tasks assessed/performed Overall Cognitive Status: Within Functional Limits for tasks assessed  General Comments General comments (skin integrity, edema, etc.): bil LE edema    Exercises     Assessment/Plan    PT Assessment Patient needs continued PT services  PT Problem List Decreased strength;Decreased activity tolerance;Decreased balance;Decreased mobility;Decreased knowledge of use of  DME;Decreased safety awareness       PT Treatment Interventions DME instruction;Gait training;Functional mobility training;Therapeutic activities;Therapeutic exercise;Balance training;Patient/family education    PT Goals (Current goals can be found in the Care Plan section)  Acute Rehab PT Goals Patient Stated Goal: to go home PT Goal Formulation: With patient Time For Goal Achievement: 12/19/17 Potential to Achieve Goals: Good    Frequency Min 3X/week   Barriers to discharge        Co-evaluation               AM-PAC PT "6 Clicks" Daily Activity  Outcome Measure Difficulty turning over in bed (including adjusting bedclothes, sheets and blankets)?: None Difficulty moving from lying on back to sitting on the side of the bed? : None Difficulty sitting down on and standing up from a chair with arms (e.g., wheelchair, bedside commode, etc,.)?: None Help needed moving to and from a bed to chair (including a wheelchair)?: A Little Help needed walking in hospital room?: Total Help needed climbing 3-5 steps with a railing? : Total 6 Click Score: 17    End of Session Equipment Utilized During Treatment: Gait belt Activity Tolerance: Patient limited by fatigue Patient left: in chair;with call bell/phone within reach;with chair alarm set;with family/visitor present Nurse Communication: Mobility status PT Visit Diagnosis: Unsteadiness on feet (R26.81);Muscle weakness (generalized) (M62.81)    Time: 3361-2244 PT Time Calculation (min) (ACUTE ONLY): 18 min   Charges:   PT Evaluation $PT Eval Moderate Complexity: 1 Mod     PT G Codes:        Chrisann Melaragno,PT Acute Rehabilitation [phone] (pager)   Denice Paradise 12/05/2017, 12:56 PM

## 2017-12-05 NOTE — Progress Notes (Signed)
This RN gave report to [employer] on 5W at around 2000. The pt was safely transferred to 5W03 at approximately 2035.

## 2017-12-05 NOTE — Progress Notes (Signed)
PROGRESS NOTE    Kathleen [name]  NOB:[id number] DOB: 1946/07/22 DOA: 12/04/2017 PCP: Levin Erp, MD   Brief Narrative: Kathleen [name] is a 71 y.o. female with medical history significant A. fib on anticoagulation, hypertension, non-insulin-dependent diabetes, CAD status post stent, chronic heart failure. Patient presented secondary to unresponsiveness and met SIRS criteria on admission; found to have influenza A infection. She required Bair hugger for hypothermia which has now resolved. Empiric antibiotics for ?pneumonia.    Assessment & Plan:   Principal Problem:   SIRS (systemic inflammatory response syndrome) (HCC) Active Problems:   OBESITY   Essential hypertension   Diabetes mellitus (HCC)   Chronic kidney disease, stage III (moderate) (HCC)   Paroxysmal atrial fibrillation (HCC)   Chronic diastolic CHF (congestive heart failure) (HCC)   Type 2 diabetes mellitus with hypoglycemia without coma (HCC)   Hypothermia   Elevated LFTs   Sepsis SIRS criteria met with source of influenza. Currently resolved. -blood/urine cultures pending -empirically covering for bacterial pneumonia -MRSA negative; discontinue vancomycin  Influenza A infection Afebrile overnight. -Continue Tamiflu; will have to watch in setting of elevated AST/ALT -albuterol nebulizer treatments prn  Essential hypertension -restart home metoprolol -hold irbesartan and Bidil  Chronic diastolic heart failure No exacerbation. -restart Lasix and metoprolol -holding irbesartan in setting of kidney disease and normotensive blood pressures   Atrial fibrillation Rate controlled. -Continue metoprolol and Eliquis  Elevated LFT No abdominal pain. -hepatic function panel  Diabetes mellitus, type 2 Diet controlled. -Continue SSI  Obesity Body mass index is 43.29 kg/m.    DVT prophylaxis: Eliquis Code Status: Full code Family Communication: Sister at bedside Disposition Plan: Transfer to  telemetry. Anticipate discharge tomorrow. PT eval pending.   Consultants:   None  Procedures:   Bair hugger  Antimicrobials:  Vancomycin    Subjective: Feels better. No chest pain or dyspnea  Objective: Vitals:   12/05/17 0500 12/05/17 0600 12/05/17 0700 12/05/17 0734  BP: 132/70 131/90 128/72 128/72  Pulse: 86 93 86 93  Resp: 19 (!) 22 (!) 27 (!) 28  Temp:    98.1 F (36.7 C)  TempSrc:    Oral  SpO2: 95% 97% 94% 96%  Weight: 114.4 kg (252 lb 3.3 oz)     Height:        Intake/Output Summary (Last 24 hours) at 12/05/2017 1134 Last data filed at 12/05/2017 0600 Gross per 24 hour  Intake 1285 ml  Output 1550 ml  Net -265 ml   Filed Weights   12/04/17 1819 12/04/17 1829 12/05/17 0500  Weight: 112 kg (246 lb 14.6 oz) 111.6 kg (246 lb) 114.4 kg (252 lb 3.3 oz)    Examination:  General exam: Appears calm and comfortable Respiratory system: Bilateral wheezing. Respiratory effort normal. Cardiovascular system: S1 & S2 heard, RRR. No murmurs, rubs, gallops or clicks. Gastrointestinal system: Abdomen is nondistended, soft and nontender. No organomegaly or masses felt. Normal bowel sounds heard. Central nervous system: Alert and oriented. No focal neurological deficits. Extremities: No edema. No calf tenderness Skin: No cyanosis. No rashes Psychiatry: Judgement and insight appear normal. Mood & affect appropriate.     Data Reviewed: I have personally reviewed following labs and imaging studies  CBC: Recent Labs  Lab 12/04/17 0425 12/05/17 0434  WBC 13.0* 9.1  NEUTROABS 10.1*  --   HGB 13.2 11.3*  HCT 41.0 35.9*  MCV 89.9 90.4  PLT 168 947*   Basic Metabolic Panel: Recent Labs  Lab 12/04/17 0425 12/05/17 0434  NA  136 136  K 3.5 3.6  CL 101 104  CO2 26 24  GLUCOSE 72 139*  BUN 66* 55*  CREATININE 2.13* 1.97*  CALCIUM 8.7* 8.0*  MG  --  2.1   GFR: Estimated Creatinine Clearance: 31.6 mL/min (A) (by C-G formula based on SCr of 1.97 mg/dL  (H)). Liver Function Tests: Recent Labs  Lab 12/04/17 0425  AST 72*  ALT 56*  ALKPHOS 92  BILITOT 1.4*  PROT 7.6  ALBUMIN 3.4*   No results for input(s): LIPASE, AMYLASE in the last 168 hours. No results for input(s): AMMONIA in the last 168 hours. Coagulation Profile: Recent Labs  Lab 12/04/17 0912  INR 1.34   Cardiac Enzymes: No results for input(s): CKTOTAL, CKMB, CKMBINDEX, TROPONINI in the last 168 hours. BNP (last 3 results) No results for input(s): PROBNP in the last 8760 hours. HbA1C: Recent Labs    12/04/17 0425  HGBA1C 6.2*   CBG: Recent Labs  Lab 12/05/17 0350 12/05/17 0430 12/05/17 0545 12/05/17 0639 12/05/17 0733  GLUCAP 114* 156* 134* 114* 107*   Lipid Profile: No results for input(s): CHOL, HDL, LDLCALC, TRIG, CHOLHDL, LDLDIRECT in the last 72 hours. Thyroid Function Tests: No results for input(s): TSH, T4TOTAL, FREET4, T3FREE, THYROIDAB in the last 72 hours. Anemia Panel: No results for input(s): VITAMINB12, FOLATE, FERRITIN, TIBC, IRON, RETICCTPCT in the last 72 hours. Sepsis Labs: Recent Labs  Lab 12/04/17 0432 12/04/17 0912 12/04/17 1301  PROCALCITON  --  0.80  --   LATICACIDVEN 0.75 1.2 1.2    Recent Results (from the past 240 hour(s))  Blood culture (routine x 2)     Status: None (Preliminary result)   Collection Time: 12/04/17  4:25 AM  Result Value Ref Range Status   Specimen Description BLOOD RIGHT ARM  Final   Special Requests   Final    BOTTLES DRAWN AEROBIC AND ANAEROBIC Blood Culture adequate volume   Culture NO GROWTH 1 DAY  Final   Report Status PENDING  Incomplete  Blood culture (routine x 2)     Status: None (Preliminary result)   Collection Time: 12/04/17  5:00 AM  Result Value Ref Range Status   Specimen Description BLOOD LEFT HAND  Final   Special Requests   Final    BOTTLES DRAWN AEROBIC ONLY Blood Culture adequate volume   Culture NO GROWTH 1 DAY  Final   Report Status PENDING  Incomplete  MRSA PCR Screening      Status: None   Collection Time: 12/04/17 11:52 AM  Result Value Ref Range Status   MRSA by PCR NEGATIVE NEGATIVE Final    Comment:        The GeneXpert MRSA Assay (FDA approved for NASAL specimens only), is one component of a comprehensive MRSA colonization surveillance program. It is not intended to diagnose MRSA infection nor to guide or monitor treatment for MRSA infections.          Radiology Studies: Dg Chest 2 View  Result Date: 12/04/2017 CLINICAL DATA:  Productive cough EXAM: CHEST  2 VIEW COMPARISON:  Chest radiograph 01/25/2015 FINDINGS: There is mild cardiomegaly, unchanged. Pulmonary vascular congestion without overt edema. No focal consolidation. No pleural effusion or pneumothorax. IMPRESSION: Cardiomegaly and pulmonary vascular congestion without overt edema. Electronically Signed   By: Ulyses Jarred M.D.   On: 12/04/2017 05:02   Portable Chest 1 View  Result Date: 12/05/2017 CLINICAL DATA:  Cough EXAM: PORTABLE CHEST 1 VIEW COMPARISON:  12/04/2017 FINDINGS: Cardiomegaly with pulmonary vascular congestion.  Increased interstitial markings in the right upper and lower lobes may reflect mild interstitial edema or possibly multifocal infection. No pleural effusion or pneumothorax. IMPRESSION: Increased interstitial markings in the right lung may reflect mild interstitial edema or multifocal infection. Electronically Signed   By: Julian Hy M.D.   On: 12/05/2017 07:52        Scheduled Meds: . allopurinol  100 mg Oral Daily  . apixaban  5 mg Oral BID  . docusate sodium  100 mg Oral BID  . famotidine  20 mg Oral QHS  . insulin aspart  0-9 Units Subcutaneous TID WC  . oseltamivir  30 mg Oral BID  . pravastatin  80 mg Oral Daily   Continuous Infusions: . ceFEPime (MAXIPIME) IV Stopped (12/05/17 5872)  . dextrose 5 % and 0.9% NaCl 75 mL/hr at 12/04/17 2052  . vancomycin       LOS: 1 day     Cordelia Poche, MD [facility] 12/05/2017, 11:34  AM Pager: [phone]  If 7PM-7AM, please contact night-coverage [email or web address] Password [facility] 12/05/2017, 11:34 AM

## 2017-12-06 DIAGNOSIS — N183 Chronic kidney disease, stage 3 (moderate): Secondary | ICD-10-CM

## 2017-12-06 DIAGNOSIS — J101 Influenza due to other identified influenza virus with other respiratory manifestations: Secondary | ICD-10-CM

## 2017-12-06 LAB — GLUCOSE, CAPILLARY
GLUCOSE-CAPILLARY: 232 mg/dL — AB (ref 65–99)
GLUCOSE-CAPILLARY: 224 mg/dL — AB (ref 65–99)
GLUCOSE-CAPILLARY: 237 mg/dL — AB (ref 65–99)

## 2017-12-06 MED ORDER — OSELTAMIVIR PHOSPHATE 30 MG PO CAPS: 30.0000 mg | ORAL_CAPSULE | Freq: Two times a day (BID) | ORAL | 0 refills | Status: AC

## 2017-12-06 MED ORDER — GUAIFENESIN ER 600 MG PO TB12: 1200.0000 mg | ORAL_TABLET | Freq: Two times a day (BID) | ORAL | 0 refills | Status: DC | PRN

## 2017-12-06 MED ORDER — ALBUTEROL SULFATE HFA 108 (90 BASE) MCG/ACT IN AERS: 1.0000 | INHALATION_SPRAY | RESPIRATORY_TRACT | 0 refills | Status: AC | PRN

## 2017-12-06 MED ORDER — DOCUSATE SODIUM 100 MG PO CAPS: 100.0000 mg | ORAL_CAPSULE | Freq: Two times a day (BID) | ORAL | 0 refills | Status: DC

## 2017-12-06 MED ORDER — GUAIFENESIN ER 600 MG PO TB12: 1200.0000 mg | ORAL_TABLET | Freq: Two times a day (BID) | ORAL | Status: DC | PRN

## 2017-12-06 NOTE — Discharge Summary (Signed)
Physician Discharge Summary  Kathleen [name] NLG:[id number] DOB: 04-08-1946 DOA: 12/04/2017  PCP: Nolene Ebbs, MD  Admit date: 12/04/2017 Discharge date: 12/06/2017  Admitted From: Home Disposition: Home  Recommendations for Outpatient Follow-up:  1. Follow up with PCP in 1 week 2. Please obtain BMP/CBC in one week 3. Please follow up on the following pending results: Blood culture; final result  Home Health: PT, OT Equipment/Devices: None  Discharge Condition: Stable CODE STATUS: Full code Diet recommendation: Heart healthy   Brief/Interim Summary:  Admission HPI written by Lady Deutscher, MD   Chief Complaint: unresponisveness  HPI: Kathleen [name] is a delightful 71 y.o. female with medical history significant A. fib on anticoagulation, hypertension, non-insulin-dependent diabetes, CAD status post stent, chronic heart failure presents to the emergency department with the chief complaint of unresponsiveness. Initial evaluation revealed hypoglycemia hypothermia leukocytosis, mild hypotension mild tachypnea and 30. [facility] are asked to admit  Information is obtained from the patient and the chart and brother who is at the bedside. Patient reports she returned from a cruise 4 days ago. She states many passengers were sick. Family indicate he should found unresponsive 2:30 AM. Chart indicates patient arrived to the emergency department via EMS at 4 AM. There is no report of any fever chills nausea vomiting diarrhea. Patient denied numbness tingling of extremities. She reports a persistent "cold" with a cough that causes her chest hurt. She reports she is coughing thick dark sputum. Reports some mild shortness of breath. She denies headache dizziness syncope or near-syncope. She denies lower extremity edema or orthopnea.    ED Course: The emergency department she is hypothermic with a soft blood pressure mild tachypnea quite lethargic. She is also hypoglycemic.  She is provided with IV fluids and an amp of D50. At the time of admission she is drowsy but responds to verbal stimuli answers questions appropriately and follows commands      Hospital course:  Sepsis SIRS criteria met with source of influenza. Currently resolved. Blood culture without growth. Empirically covered with Vancomycin and cefepime, which were discontinued.   Unresponsiveness Likely secondary to hypoglycemia in setting of sepsis.  Influenza A infection Patient started on Tamiflu, renally adjusted. Albuterol prn for wheezing. Flutter valve and Mucinex. No pneumonia on x-ray  Essential hypertension Home regimen on discharge.  Chronic diastolic heart failure No exacerbation. -restart Lasix and metoprolol -holding irbesartan in setting of kidney disease and normotensive blood pressures  Acute kidney injury on CKD IV Baseline of about 2. 2.3 on admission and improved to 1.97 prior to discharge with IV fluids. Recheck BMP as an outpatient.  Atrial fibrillation Rate controlled. Continued metoprolol and Eliquis  Elevated LFT No abdominal pain. In setting of acute viral illness. Resolved prior to discharge.  Diabetes mellitus, type 2 On glipizide as an outpatient. Hemoglobin A1C of 6.2. Recommend to discontinue glipizide. Diet modification and weight loss.  Obesity Body mass index is 43.29 kg/m.     Discharge Diagnoses:  Principal Problem:   SIRS (systemic inflammatory response syndrome) (HCC) Active Problems:   OBESITY   Essential hypertension   Diabetes mellitus (HCC)   Chronic kidney disease, stage III (moderate) (HCC)   Paroxysmal atrial fibrillation (HCC)   Chronic diastolic CHF (congestive heart failure) (HCC)   Type 2 diabetes mellitus with hypoglycemia without coma (HCC)   Hypothermia   Elevated LFTs    Discharge Instructions  Discharge Instructions    Call MD for:  difficulty breathing, headache or visual disturbances  Complete by:   As directed    Call MD for:  extreme fatigue   Complete by:  As directed    Call MD for:  temperature >100.4   Complete by:  As directed    Diet - low sodium heart healthy   Complete by:  As directed    Increase activity slowly   Complete by:  As directed      Allergies as of 12/06/2017      Reactions   Penicillins Other (See Comments)   unknown      Medication List    STOP taking these medications   glipiZIDE 5 MG tablet Commonly known as:  GLUCOTROL   irbesartan 75 MG tablet Commonly known as:  AVAPRO     TAKE these medications   albuterol 108 (90 Base) MCG/ACT inhaler Commonly known as:  PROVENTIL HFA;VENTOLIN HFA Inhale 1-2 puffs into the lungs every 4 (four) hours as needed for wheezing or shortness of breath. What changed:  when to take this   allopurinol 100 MG tablet Commonly known as:  ZYLOPRIM Take 100 mg by mouth daily.   docusate sodium 100 MG capsule Commonly known as:  COLACE Take 1 capsule (100 mg total) by mouth 2 (two) times daily.   ELIQUIS 5 MG Tabs tablet Generic drug:  apixaban take 1 tablet by mouth twice a day   furosemide 80 MG tablet Commonly known as:  LASIX Take 80 mg by mouth daily.   guaiFENesin 600 MG 12 hr tablet Commonly known as:  MUCINEX Take 2 tablets (1,200 mg total) by mouth 2 (two) times daily as needed.   isosorbide-hydrALAZINE 20-37.5 MG tablet Commonly known as:  BIDIL Take 1 tablet by mouth 3 (three) times daily.   metoprolol tartrate 25 MG tablet Commonly known as:  LOPRESSOR take 3 tablets by mouth twice a day   oseltamivir 30 MG capsule Commonly known as:  TAMIFLU Take 1 capsule (30 mg total) by mouth 2 (two) times daily for 3 days.   pravastatin 80 MG tablet Commonly known as:  PRAVACHOL Take 1 tablet (80 mg total) by mouth daily.       Allergies  Allergen Reactions  . Penicillins Other (See Comments)    unknown    Consultations:  None   Procedures/Studies: Dg Chest 2 View  Result  Date: 12/04/2017 CLINICAL DATA:  Productive cough EXAM: CHEST  2 VIEW COMPARISON:  Chest radiograph 01/25/2015 FINDINGS: There is mild cardiomegaly, unchanged. Pulmonary vascular congestion without overt edema. No focal consolidation. No pleural effusion or pneumothorax. IMPRESSION: Cardiomegaly and pulmonary vascular congestion without overt edema. Electronically Signed   By: Ulyses Jarred M.D.   On: 12/04/2017 05:02   Portable Chest 1 View  Result Date: 12/05/2017 CLINICAL DATA:  Cough EXAM: PORTABLE CHEST 1 VIEW COMPARISON:  12/04/2017 FINDINGS: Cardiomegaly with pulmonary vascular congestion. Increased interstitial markings in the right upper and lower lobes may reflect mild interstitial edema or possibly multifocal infection. No pleural effusion or pneumothorax. IMPRESSION: Increased interstitial markings in the right lung may reflect mild interstitial edema or multifocal infection. Electronically Signed   By: Julian Hy M.D.   On: 12/05/2017 07:52      Subjective: Symptoms improved. Has mildly productive sputum. No fevers overnight.  Discharge Exam: Vitals:   12/06/17 0902 12/06/17 1031  BP: (!) 181/61   Pulse: 75 72  Resp:  18  Temp:    SpO2:  98%   Vitals:   12/06/17 0446 12/06/17 0500 12/06/17 0902 12/06/17  1031  BP: (!) 148/96  (!) 181/61   Pulse: 81  75 72  Resp: 14   18  Temp: 97.7 F (36.5 C)     TempSrc: Oral     SpO2: 100%   98%  Weight:  114 kg (251 lb 5.2 oz)    Height:        General: Pt is alert, awake, not in acute distress Cardiovascular: RRR, S1/S2 +, no rubs, no gallops Respiratory: mild wheezing, no rhonchi Abdominal: Soft, NT, ND, bowel sounds + Extremities: no edema, no cyanosis    The results of significant diagnostics from this hospitalization (including imaging, microbiology, ancillary and laboratory) are listed below for reference.     Microbiology: Recent Results (from the past 240 hour(s))  Blood culture (routine x 2)     Status:  None (Preliminary result)   Collection Time: 12/04/17  4:25 AM  Result Value Ref Range Status   Specimen Description BLOOD RIGHT ARM  Final   Special Requests   Final    BOTTLES DRAWN AEROBIC AND ANAEROBIC Blood Culture adequate volume   Culture NO GROWTH 2 DAYS  Final   Report Status PENDING  Incomplete  Blood culture (routine x 2)     Status: None (Preliminary result)   Collection Time: 12/04/17  5:00 AM  Result Value Ref Range Status   Specimen Description BLOOD LEFT HAND  Final   Special Requests   Final    BOTTLES DRAWN AEROBIC ONLY Blood Culture adequate volume   Culture NO GROWTH 2 DAYS  Final   Report Status PENDING  Incomplete  MRSA PCR Screening     Status: None   Collection Time: 12/04/17 11:52 AM  Result Value Ref Range Status   MRSA by PCR NEGATIVE NEGATIVE Final    Comment:        The GeneXpert MRSA Assay (FDA approved for NASAL specimens only), is one component of a comprehensive MRSA colonization surveillance program. It is not intended to diagnose MRSA infection nor to guide or monitor treatment for MRSA infections.      Labs: BNP (last 3 results) No results for input(s): BNP in the last 8760 hours. Basic Metabolic Panel: Recent Labs  Lab 12/04/17 0425 12/05/17 0434  NA 136 136  K 3.5 3.6  CL 101 104  CO2 26 24  GLUCOSE 72 139*  BUN 66* 55*  CREATININE 2.13* 1.97*  CALCIUM 8.7* 8.0*  MG  --  2.1   Liver Function Tests: Recent Labs  Lab 12/04/17 0425 12/05/17 0434  AST 72* 38  ALT 56* 40  ALKPHOS 92 89  BILITOT 1.4* 0.7  PROT 7.6 6.1*  ALBUMIN 3.4* 2.6*   No results for input(s): LIPASE, AMYLASE in the last 168 hours. No results for input(s): AMMONIA in the last 168 hours. CBC: Recent Labs  Lab 12/04/17 0425 12/05/17 0434  WBC 13.0* 9.1  NEUTROABS 10.1*  --   HGB 13.2 11.3*  HCT 41.0 35.9*  MCV 89.9 90.4  PLT 168 146*   Cardiac Enzymes: No results for input(s): CKTOTAL, CKMB, CKMBINDEX, TROPONINI in the last 168  hours. BNP: Invalid input(s): POCBNP CBG: Recent Labs  Lab 12/05/17 1624 12/05/17 2218 12/06/17 0446 12/06/17 0803 12/06/17 1204  GLUCAP 200* 255* 232* 224* 237*   D-Dimer No results for input(s): DDIMER in the last 72 hours. Hgb A1c Recent Labs    12/04/17 0425  HGBA1C 6.2*   Lipid Profile No results for input(s): CHOL, HDL, LDLCALC, TRIG, CHOLHDL, LDLDIRECT  in the last 72 hours. Thyroid function studies No results for input(s): TSH, T4TOTAL, T3FREE, THYROIDAB in the last 72 hours.  Invalid input(s): FREET3 Anemia work up No results for input(s): VITAMINB12, FOLATE, FERRITIN, TIBC, IRON, RETICCTPCT in the last 72 hours. Urinalysis    Component Value Date/Time   COLORURINE YELLOW 12/04/2017 0500   APPEARANCEUR CLEAR 12/04/2017 0500   LABSPEC 1.010 12/04/2017 0500   PHURINE 5.0 12/04/2017 0500   GLUCOSEU NEGATIVE 12/04/2017 0500   HGBUR NEGATIVE 12/04/2017 0500   BILIRUBINUR NEGATIVE 12/04/2017 0500   KETONESUR NEGATIVE 12/04/2017 0500   PROTEINUR 30 (A) 12/04/2017 0500   UROBILINOGEN 1.0 08/15/2015 0915   NITRITE NEGATIVE 12/04/2017 0500   LEUKOCYTESUR NEGATIVE 12/04/2017 0500   Sepsis Labs Invalid input(s): PROCALCITONIN,  WBC,  LACTICIDVEN Microbiology Recent Results (from the past 240 hour(s))  Blood culture (routine x 2)     Status: None (Preliminary result)   Collection Time: 12/04/17  4:25 AM  Result Value Ref Range Status   Specimen Description BLOOD RIGHT ARM  Final   Special Requests   Final    BOTTLES DRAWN AEROBIC AND ANAEROBIC Blood Culture adequate volume   Culture NO GROWTH 2 DAYS  Final   Report Status PENDING  Incomplete  Blood culture (routine x 2)     Status: None (Preliminary result)   Collection Time: 12/04/17  5:00 AM  Result Value Ref Range Status   Specimen Description BLOOD LEFT HAND  Final   Special Requests   Final    BOTTLES DRAWN AEROBIC ONLY Blood Culture adequate volume   Culture NO GROWTH 2 DAYS  Final   Report Status  PENDING  Incomplete  MRSA PCR Screening     Status: None   Collection Time: 12/04/17 11:52 AM  Result Value Ref Range Status   MRSA by PCR NEGATIVE NEGATIVE Final    Comment:        The GeneXpert MRSA Assay (FDA approved for NASAL specimens only), is one component of a comprehensive MRSA colonization surveillance program. It is not intended to diagnose MRSA infection nor to guide or monitor treatment for MRSA infections.      SIGNED:   Cordelia Poche, MD [facility] 12/06/2017, 4:58 PM Pager [phone]  If 7PM-7AM, please contact night-coverage [email or web address] Password [email or web address]

## 2017-12-06 NOTE — Progress Notes (Signed)
[name] to be D/C'd Home per MD order.  Discussed with the patient and all questions fully answered.  VSS, Skin clean, dry and intact without evidence of skin break down, no evidence of skin tears noted. IV catheter discontinued intact. Site without signs and symptoms of complications. Dressing and pressure applied.  An After Visit Summary was printed and given to the patient. Patient received prescription.  D/c education completed with patient/family including follow up instructions, medication list, d/c activities limitations if indicated, with other d/c instructions as indicated by MD - patient able to verbalize understanding, all questions fully answered.   Patient instructed to return to ED, call 911, or call MD for any changes in condition.   Patient escorted via [place], and D/C home via private auto.  Dorris Carnes 12/06/2017 5:14 PM

## 2017-12-07 ENCOUNTER — Other Ambulatory Visit: Payer: Self-pay | Admitting: Cardiology

## 2017-12-07 DIAGNOSIS — I6789 Other cerebrovascular disease: Secondary | ICD-10-CM | POA: Diagnosis not present

## 2017-12-08 DIAGNOSIS — I6789 Other cerebrovascular disease: Secondary | ICD-10-CM | POA: Diagnosis not present

## 2017-12-09 DIAGNOSIS — I6789 Other cerebrovascular disease: Secondary | ICD-10-CM | POA: Diagnosis not present

## 2017-12-09 LAB — CULTURE, BLOOD (ROUTINE X 2)
CULTURE: NO GROWTH
CULTURE: NO GROWTH
SPECIAL REQUESTS: ADEQUATE
Special Requests: ADEQUATE

## 2017-12-10 DIAGNOSIS — I6789 Other cerebrovascular disease: Secondary | ICD-10-CM | POA: Diagnosis not present

## 2017-12-11 DIAGNOSIS — I6789 Other cerebrovascular disease: Secondary | ICD-10-CM | POA: Diagnosis not present

## 2017-12-12 DIAGNOSIS — I6789 Other cerebrovascular disease: Secondary | ICD-10-CM | POA: Diagnosis not present

## 2017-12-13 DIAGNOSIS — I6789 Other cerebrovascular disease: Secondary | ICD-10-CM | POA: Diagnosis not present

## 2017-12-14 DIAGNOSIS — I6789 Other cerebrovascular disease: Secondary | ICD-10-CM | POA: Diagnosis not present

## 2017-12-15 DIAGNOSIS — I6789 Other cerebrovascular disease: Secondary | ICD-10-CM | POA: Diagnosis not present

## 2017-12-16 DIAGNOSIS — I6789 Other cerebrovascular disease: Secondary | ICD-10-CM | POA: Diagnosis not present

## 2017-12-17 DIAGNOSIS — I6789 Other cerebrovascular disease: Secondary | ICD-10-CM | POA: Diagnosis not present

## 2017-12-18 DIAGNOSIS — I6789 Other cerebrovascular disease: Secondary | ICD-10-CM | POA: Diagnosis not present

## 2017-12-19 DIAGNOSIS — J209 Acute bronchitis, unspecified: Secondary | ICD-10-CM | POA: Diagnosis not present

## 2017-12-19 DIAGNOSIS — E1159 Type 2 diabetes mellitus with other circulatory complications: Secondary | ICD-10-CM | POA: Diagnosis not present

## 2017-12-19 DIAGNOSIS — I10 Essential (primary) hypertension: Secondary | ICD-10-CM | POA: Diagnosis not present

## 2017-12-19 DIAGNOSIS — I6789 Other cerebrovascular disease: Secondary | ICD-10-CM | POA: Diagnosis not present

## 2017-12-19 DIAGNOSIS — J449 Chronic obstructive pulmonary disease, unspecified: Secondary | ICD-10-CM | POA: Diagnosis not present

## 2017-12-19 DIAGNOSIS — I5032 Chronic diastolic (congestive) heart failure: Secondary | ICD-10-CM | POA: Diagnosis not present

## 2017-12-20 DIAGNOSIS — I6789 Other cerebrovascular disease: Secondary | ICD-10-CM | POA: Diagnosis not present

## 2017-12-21 DIAGNOSIS — I6789 Other cerebrovascular disease: Secondary | ICD-10-CM | POA: Diagnosis not present

## 2017-12-21 DIAGNOSIS — I517 Cardiomegaly: Secondary | ICD-10-CM | POA: Diagnosis not present

## 2017-12-21 DIAGNOSIS — J811 Chronic pulmonary edema: Secondary | ICD-10-CM | POA: Diagnosis not present

## 2017-12-21 DIAGNOSIS — J209 Acute bronchitis, unspecified: Secondary | ICD-10-CM | POA: Diagnosis not present

## 2017-12-22 DIAGNOSIS — I6789 Other cerebrovascular disease: Secondary | ICD-10-CM | POA: Diagnosis not present

## 2017-12-23 DIAGNOSIS — J449 Chronic obstructive pulmonary disease, unspecified: Secondary | ICD-10-CM | POA: Diagnosis not present

## 2017-12-23 DIAGNOSIS — I6789 Other cerebrovascular disease: Secondary | ICD-10-CM | POA: Diagnosis not present

## 2017-12-24 DIAGNOSIS — I6789 Other cerebrovascular disease: Secondary | ICD-10-CM | POA: Diagnosis not present

## 2017-12-25 DIAGNOSIS — I6789 Other cerebrovascular disease: Secondary | ICD-10-CM | POA: Diagnosis not present

## 2017-12-26 DIAGNOSIS — I6789 Other cerebrovascular disease: Secondary | ICD-10-CM | POA: Diagnosis not present

## 2017-12-27 DIAGNOSIS — I6789 Other cerebrovascular disease: Secondary | ICD-10-CM | POA: Diagnosis not present

## 2017-12-28 DIAGNOSIS — I6789 Other cerebrovascular disease: Secondary | ICD-10-CM | POA: Diagnosis not present

## 2017-12-28 DIAGNOSIS — J449 Chronic obstructive pulmonary disease, unspecified: Secondary | ICD-10-CM | POA: Diagnosis not present

## 2017-12-29 DIAGNOSIS — I6789 Other cerebrovascular disease: Secondary | ICD-10-CM | POA: Diagnosis not present

## 2017-12-30 DIAGNOSIS — I5032 Chronic diastolic (congestive) heart failure: Secondary | ICD-10-CM | POA: Diagnosis not present

## 2017-12-30 DIAGNOSIS — J449 Chronic obstructive pulmonary disease, unspecified: Secondary | ICD-10-CM | POA: Diagnosis not present

## 2017-12-30 DIAGNOSIS — I10 Essential (primary) hypertension: Secondary | ICD-10-CM | POA: Diagnosis not present

## 2017-12-30 DIAGNOSIS — M1000 Idiopathic gout, unspecified site: Secondary | ICD-10-CM | POA: Diagnosis not present

## 2017-12-30 DIAGNOSIS — I6789 Other cerebrovascular disease: Secondary | ICD-10-CM | POA: Diagnosis not present

## 2017-12-30 DIAGNOSIS — E1159 Type 2 diabetes mellitus with other circulatory complications: Secondary | ICD-10-CM | POA: Diagnosis not present

## 2017-12-31 DIAGNOSIS — I6789 Other cerebrovascular disease: Secondary | ICD-10-CM | POA: Diagnosis not present

## 2018-01-01 DIAGNOSIS — I6789 Other cerebrovascular disease: Secondary | ICD-10-CM | POA: Diagnosis not present

## 2018-01-02 DIAGNOSIS — I6789 Other cerebrovascular disease: Secondary | ICD-10-CM | POA: Diagnosis not present

## 2018-01-03 DIAGNOSIS — I6789 Other cerebrovascular disease: Secondary | ICD-10-CM | POA: Diagnosis not present

## 2018-01-04 DIAGNOSIS — I6789 Other cerebrovascular disease: Secondary | ICD-10-CM | POA: Diagnosis not present

## 2018-01-05 ENCOUNTER — Other Ambulatory Visit: Payer: Self-pay | Admitting: Cardiology

## 2018-01-05 DIAGNOSIS — I6789 Other cerebrovascular disease: Secondary | ICD-10-CM | POA: Diagnosis not present

## 2018-01-06 DIAGNOSIS — I6789 Other cerebrovascular disease: Secondary | ICD-10-CM | POA: Diagnosis not present

## 2018-01-07 DIAGNOSIS — I6789 Other cerebrovascular disease: Secondary | ICD-10-CM | POA: Diagnosis not present

## 2018-01-08 DIAGNOSIS — I6789 Other cerebrovascular disease: Secondary | ICD-10-CM | POA: Diagnosis not present

## 2018-01-09 DIAGNOSIS — I6789 Other cerebrovascular disease: Secondary | ICD-10-CM | POA: Diagnosis not present

## 2018-01-09 DIAGNOSIS — J449 Chronic obstructive pulmonary disease, unspecified: Secondary | ICD-10-CM | POA: Diagnosis not present

## 2018-01-10 DIAGNOSIS — I6789 Other cerebrovascular disease: Secondary | ICD-10-CM | POA: Diagnosis not present

## 2018-01-11 DIAGNOSIS — I6789 Other cerebrovascular disease: Secondary | ICD-10-CM | POA: Diagnosis not present

## 2018-01-12 DIAGNOSIS — J449 Chronic obstructive pulmonary disease, unspecified: Secondary | ICD-10-CM | POA: Diagnosis not present

## 2018-01-12 DIAGNOSIS — I6789 Other cerebrovascular disease: Secondary | ICD-10-CM | POA: Diagnosis not present

## 2018-01-13 DIAGNOSIS — I6789 Other cerebrovascular disease: Secondary | ICD-10-CM | POA: Diagnosis not present

## 2018-01-14 DIAGNOSIS — I6789 Other cerebrovascular disease: Secondary | ICD-10-CM | POA: Diagnosis not present

## 2018-01-15 DIAGNOSIS — I6789 Other cerebrovascular disease: Secondary | ICD-10-CM | POA: Diagnosis not present

## 2018-01-16 DIAGNOSIS — I6789 Other cerebrovascular disease: Secondary | ICD-10-CM | POA: Diagnosis not present

## 2018-01-17 DIAGNOSIS — I6789 Other cerebrovascular disease: Secondary | ICD-10-CM | POA: Diagnosis not present

## 2018-01-18 DIAGNOSIS — I6789 Other cerebrovascular disease: Secondary | ICD-10-CM | POA: Diagnosis not present

## 2018-01-19 DIAGNOSIS — I6789 Other cerebrovascular disease: Secondary | ICD-10-CM | POA: Diagnosis not present

## 2018-01-20 DIAGNOSIS — I6789 Other cerebrovascular disease: Secondary | ICD-10-CM | POA: Diagnosis not present

## 2018-01-21 DIAGNOSIS — I6789 Other cerebrovascular disease: Secondary | ICD-10-CM | POA: Diagnosis not present

## 2018-01-22 DIAGNOSIS — I6789 Other cerebrovascular disease: Secondary | ICD-10-CM | POA: Diagnosis not present

## 2018-01-23 DIAGNOSIS — J449 Chronic obstructive pulmonary disease, unspecified: Secondary | ICD-10-CM | POA: Diagnosis not present

## 2018-01-23 DIAGNOSIS — I6789 Other cerebrovascular disease: Secondary | ICD-10-CM | POA: Diagnosis not present

## 2018-01-24 DIAGNOSIS — J449 Chronic obstructive pulmonary disease, unspecified: Secondary | ICD-10-CM | POA: Diagnosis not present

## 2018-01-24 DIAGNOSIS — I6789 Other cerebrovascular disease: Secondary | ICD-10-CM | POA: Diagnosis not present

## 2018-01-25 DIAGNOSIS — I6789 Other cerebrovascular disease: Secondary | ICD-10-CM | POA: Diagnosis not present

## 2018-01-26 DIAGNOSIS — I6789 Other cerebrovascular disease: Secondary | ICD-10-CM | POA: Diagnosis not present

## 2018-01-27 DIAGNOSIS — I6789 Other cerebrovascular disease: Secondary | ICD-10-CM | POA: Diagnosis not present

## 2018-01-28 DIAGNOSIS — I6789 Other cerebrovascular disease: Secondary | ICD-10-CM | POA: Diagnosis not present

## 2018-01-29 DIAGNOSIS — I6789 Other cerebrovascular disease: Secondary | ICD-10-CM | POA: Diagnosis not present

## 2018-01-30 DIAGNOSIS — I5032 Chronic diastolic (congestive) heart failure: Secondary | ICD-10-CM | POA: Diagnosis not present

## 2018-01-30 DIAGNOSIS — E1159 Type 2 diabetes mellitus with other circulatory complications: Secondary | ICD-10-CM | POA: Diagnosis not present

## 2018-01-30 DIAGNOSIS — R600 Localized edema: Secondary | ICD-10-CM | POA: Diagnosis not present

## 2018-01-30 DIAGNOSIS — I10 Essential (primary) hypertension: Secondary | ICD-10-CM | POA: Diagnosis not present

## 2018-01-30 DIAGNOSIS — I6789 Other cerebrovascular disease: Secondary | ICD-10-CM | POA: Diagnosis not present

## 2018-01-30 DIAGNOSIS — I2510 Atherosclerotic heart disease of native coronary artery without angina pectoris: Secondary | ICD-10-CM | POA: Diagnosis not present

## 2018-01-30 DIAGNOSIS — J449 Chronic obstructive pulmonary disease, unspecified: Secondary | ICD-10-CM | POA: Diagnosis not present

## 2018-01-31 DIAGNOSIS — I6789 Other cerebrovascular disease: Secondary | ICD-10-CM | POA: Diagnosis not present

## 2018-02-01 DIAGNOSIS — I6789 Other cerebrovascular disease: Secondary | ICD-10-CM | POA: Diagnosis not present

## 2018-02-02 ENCOUNTER — Telehealth: Payer: Self-pay | Admitting: Cardiology

## 2018-02-02 DIAGNOSIS — I6789 Other cerebrovascular disease: Secondary | ICD-10-CM | POA: Diagnosis not present

## 2018-02-02 NOTE — Telephone Encounter (Signed)
Records sent from [facility] [place] on 01/30/18, for Appt 03/08/18. [place]

## 2018-02-03 DIAGNOSIS — I6789 Other cerebrovascular disease: Secondary | ICD-10-CM | POA: Diagnosis not present

## 2018-02-04 ENCOUNTER — Other Ambulatory Visit: Payer: Self-pay | Admitting: Cardiology

## 2018-02-04 DIAGNOSIS — I6789 Other cerebrovascular disease: Secondary | ICD-10-CM | POA: Diagnosis not present

## 2018-02-05 DIAGNOSIS — I6789 Other cerebrovascular disease: Secondary | ICD-10-CM | POA: Diagnosis not present

## 2018-02-06 DIAGNOSIS — I6789 Other cerebrovascular disease: Secondary | ICD-10-CM | POA: Diagnosis not present

## 2018-02-07 DIAGNOSIS — I6789 Other cerebrovascular disease: Secondary | ICD-10-CM | POA: Diagnosis not present

## 2018-02-08 DIAGNOSIS — I6789 Other cerebrovascular disease: Secondary | ICD-10-CM | POA: Diagnosis not present

## 2018-02-09 DIAGNOSIS — J449 Chronic obstructive pulmonary disease, unspecified: Secondary | ICD-10-CM | POA: Diagnosis not present

## 2018-02-09 DIAGNOSIS — I6789 Other cerebrovascular disease: Secondary | ICD-10-CM | POA: Diagnosis not present

## 2018-02-10 DIAGNOSIS — I6789 Other cerebrovascular disease: Secondary | ICD-10-CM | POA: Diagnosis not present

## 2018-02-11 DIAGNOSIS — I6789 Other cerebrovascular disease: Secondary | ICD-10-CM | POA: Diagnosis not present

## 2018-02-12 DIAGNOSIS — I6789 Other cerebrovascular disease: Secondary | ICD-10-CM | POA: Diagnosis not present

## 2018-02-13 DIAGNOSIS — I6789 Other cerebrovascular disease: Secondary | ICD-10-CM | POA: Diagnosis not present

## 2018-02-14 ENCOUNTER — Encounter: Payer: Self-pay | Admitting: Podiatry

## 2018-02-14 ENCOUNTER — Ambulatory Visit (INDEPENDENT_AMBULATORY_CARE_PROVIDER_SITE_OTHER): Payer: Medicare HMO

## 2018-02-14 ENCOUNTER — Ambulatory Visit (INDEPENDENT_AMBULATORY_CARE_PROVIDER_SITE_OTHER): Payer: Medicare HMO | Admitting: Podiatry

## 2018-02-14 DIAGNOSIS — L84 Corns and callosities: Secondary | ICD-10-CM

## 2018-02-14 DIAGNOSIS — I739 Peripheral vascular disease, unspecified: Secondary | ICD-10-CM | POA: Diagnosis not present

## 2018-02-14 DIAGNOSIS — E1149 Type 2 diabetes mellitus with other diabetic neurological complication: Secondary | ICD-10-CM | POA: Diagnosis not present

## 2018-02-14 DIAGNOSIS — I6789 Other cerebrovascular disease: Secondary | ICD-10-CM | POA: Diagnosis not present

## 2018-02-14 DIAGNOSIS — R609 Edema, unspecified: Secondary | ICD-10-CM

## 2018-02-14 DIAGNOSIS — Z7901 Long term (current) use of anticoagulants: Secondary | ICD-10-CM | POA: Diagnosis not present

## 2018-02-14 NOTE — Progress Notes (Signed)
Subjective:    Patient ID: Kathleen [name], female    DOB: 1946-04-03, 72 y.o.   MRN: [id number]  HPI Ms. Jamerson presents the office today for concerns of swelling to both of her feet as well as for corns and calluses.  She states she also has burning to her feet as well as tingling.  She does have history of gout.  She also has swelling to both of her legs and she has a history of congestive heart failure.  She denies any recent ulcerations or drainage to her legs or feet.  She has no other concerns today.  She is on Eliquis currently.   Review of Systems  All other systems reviewed and are negative.  Past Medical History:  Diagnosis Date  . Arthritis   . Blood transfusion    no side affects  . CHF (congestive heart failure) ([place])   . Coronary artery disease    Cypher stent to the RCA in 2003  . Diabetes mellitus    Borderline  . GERD (gastroesophageal reflux disease)   . Hypertension   . Shortness of breath     Past Surgical History:  Procedure Laterality Date  . CARDIOVERSION N/A 03/17/2015   Procedure: CARDIOVERSION;  Surgeon: Sueanne Margarita, MD;  Location: [facility] ENDOSCOPY;  Service: Cardiovascular;  Laterality: N/A;  . CHOLECYSTECTOMY    . CORONARY ANGIOPLASTY WITH STENT PLACEMENT    . TOTAL KNEE ARTHROPLASTY       Current Outpatient Medications:  .  cyclobenzaprine (FLEXERIL) 10 MG tablet, Take 10 mg by mouth 3 (three) times daily as needed for muscle spasms., Disp: , Rfl:  .  insulin glargine (LANTUS) 100 UNIT/ML injection, Inject into the skin at bedtime., Disp: , Rfl:  .  irbesartan (AVAPRO) 75 MG tablet, Take 75 mg by mouth daily., Disp: , Rfl:  .  albuterol (PROVENTIL HFA;VENTOLIN HFA) 108 (90 Base) MCG/ACT inhaler, Inhale 1-2 puffs into the lungs every 4 (four) hours as needed for wheezing or shortness of breath., Disp: 1 Inhaler, Rfl: 0 .  allopurinol (ZYLOPRIM) 100 MG tablet, Take 100 mg by mouth daily., Disp: , Rfl:  .  COLCRYS 0.6 MG tablet, , Disp: , Rfl:    .  docusate sodium (COLACE) 100 MG capsule, Take 1 capsule (100 mg total) by mouth 2 (two) times daily., Disp: 10 capsule, Rfl: 0 .  ELIQUIS 5 MG TABS tablet, take 1 tablet by mouth twice a day, Disp: 60 tablet, Rfl: 1 .  famotidine (PEPCID) 20 MG tablet, , Disp: , Rfl:  .  furosemide (LASIX) 80 MG tablet, Take 80 mg by mouth daily., Disp: , Rfl: 0 .  guaiFENesin (MUCINEX) 600 MG 12 hr tablet, Take 2 tablets (1,200 mg total) by mouth 2 (two) times daily as needed., Disp: 30 tablet, Rfl: 0 .  isosorbide-hydrALAZINE (BIDIL) 20-37.5 MG per tablet, Take 1 tablet by mouth 3 (three) times daily., Disp: 90 tablet, Rfl: 9 .  metoprolol tartrate (LOPRESSOR) 25 MG tablet, TAKE 3 TABLETS BY MOUTH TWICE A DAY, Disp: 90 tablet, Rfl: 0 .  pravastatin (PRAVACHOL) 80 MG tablet, Take 1 tablet (80 mg total) by mouth daily., Disp: 30 tablet, Rfl: 6 .  SYMBICORT 80-4.5 MCG/ACT inhaler, Inhale 2 puffs into the lungs 2 (two) times daily., Disp: , Rfl: 0  Allergies  Allergen Reactions  . Penicillins Other (See Comments)    unknown    Social History   Socioeconomic History  . Marital status: Divorced  Spouse name: Not on file  . Number of children: Not on file  . Years of education: Not on file  . Highest education level: Not on file  Social Needs  . Financial resource strain: Not on file  . Food insecurity - worry: Not on file  . Food insecurity - inability: Not on file  . Transportation needs - medical: Not on file  . Transportation needs - non-medical: Not on file  Occupational History  . Not on file  Tobacco Use  . Smoking status: Current Every Day Smoker    Packs/day: 0.30    Years: 30.00    Pack years: 9.00    Types: Cigarettes    Last attempt to quit: 01/18/2014    Years since quitting: 4.0  . Smokeless tobacco: Never Used  . Tobacco comment: Smoking cessation requested   Substance and Sexual Activity  . Alcohol use: No  . Drug use: No  . Sexual activity: Not Currently    Birth  control/protection: Post-menopausal  Other Topics Concern  . Not on file  Social History Narrative  . Not on file        Objective:   Physical Exam General: AAO x3, NAD  Dermatological: Hyperkeratotic lesions present right hallux, bilateral fifth digit as well as right submetatarsal 2.  Upon debridement there is no underlying ulceration, drainage or any clinical signs of infection noted.  There is no other open lesions or pre-ulcerative lesion identified today.  Vascular: DP pulses 1/4, PT pulses 0/4 but this could be due to swelling.  There is chronic bilateral lower extremity edema.    Neruologic: Sensation decreased with Derrel Nip monofilament.  Musculoskeletal: Flatfoot deformity is present.  There is swelling bilaterally but there is no erythema or increase in warmth.  There is no area pinpoint tenderness.Achilles tendon, flexor, extensor tendons appear to be intact.  Muscular strength 4/5 in all groups tested bilateral.    Assessment & Plan:  72 year old female with symptomatic hyperkeratotic lesions, swelling, PAD -Treatment options discussed including all alternatives, risks, and complications -X-rays were obtained and reviewed.  No evidence of acute fracture or stress fracture identified today. -Etiology of symptoms were discussed -Sharply debrided the hyperkeratotic lesions x5 without any complications or bleeding. -I did an ABI in the office and it was read as "PAD" due to this as well as swelling in her symptoms of ordered arterial duplex and consultation to evaluate her circulation. -Discussed daily foot inspection -Follow-up with me in 3 months or sooner if any issues are to arise.  Call any questions or concerns in the meantime.  Trula Slade DPM

## 2018-02-14 NOTE — Patient Instructions (Signed)

## 2018-02-15 ENCOUNTER — Other Ambulatory Visit: Payer: Self-pay | Admitting: Podiatry

## 2018-02-15 ENCOUNTER — Encounter: Payer: Self-pay | Admitting: *Deleted

## 2018-02-15 ENCOUNTER — Telehealth: Payer: Self-pay | Admitting: *Deleted

## 2018-02-15 DIAGNOSIS — I739 Peripheral vascular disease, unspecified: Secondary | ICD-10-CM

## 2018-02-15 DIAGNOSIS — R0989 Other specified symptoms and signs involving the circulatory and respiratory systems: Secondary | ICD-10-CM

## 2018-02-15 DIAGNOSIS — I6789 Other cerebrovascular disease: Secondary | ICD-10-CM | POA: Diagnosis not present

## 2018-02-15 NOTE — Telephone Encounter (Signed)
-----   Message from Trula Slade, DPM sent at 02/14/2018  6:20 PM EST ----- Can you please put in for an arterial duplex and consult given decreased pulses to her feet and swelling? Thanks  Had an abnormal study on the ABI in the office.

## 2018-02-15 NOTE — Telephone Encounter (Signed)
I informed pt of [facility] appts, and mailed note with dates and location. Faxed orders, referral, clinicals and demographics to [place].

## 2018-02-15 NOTE — Telephone Encounter (Signed)
[facility] - [facility] scheduled doppler 02/20/2018 at 12:30pm to arrive 12:15pm, consultation with Dr. Fletcher Anon 02/28/2018 at 11:00am to arrive 10:45am.

## 2018-02-16 DIAGNOSIS — I6789 Other cerebrovascular disease: Secondary | ICD-10-CM | POA: Diagnosis not present

## 2018-02-17 DIAGNOSIS — I6789 Other cerebrovascular disease: Secondary | ICD-10-CM | POA: Diagnosis not present

## 2018-02-17 NOTE — Telephone Encounter (Signed)
done

## 2018-02-18 DIAGNOSIS — I6789 Other cerebrovascular disease: Secondary | ICD-10-CM | POA: Diagnosis not present

## 2018-02-19 DIAGNOSIS — I6789 Other cerebrovascular disease: Secondary | ICD-10-CM | POA: Diagnosis not present

## 2018-02-20 ENCOUNTER — Ambulatory Visit (HOSPITAL_COMMUNITY): Admission: RE | Admit: 2018-02-20 | Payer: Medicare HMO | Source: Ambulatory Visit

## 2018-02-20 DIAGNOSIS — I6789 Other cerebrovascular disease: Secondary | ICD-10-CM | POA: Diagnosis not present

## 2018-02-20 DIAGNOSIS — J449 Chronic obstructive pulmonary disease, unspecified: Secondary | ICD-10-CM | POA: Diagnosis not present

## 2018-02-21 DIAGNOSIS — I6789 Other cerebrovascular disease: Secondary | ICD-10-CM | POA: Diagnosis not present

## 2018-02-22 DIAGNOSIS — I6789 Other cerebrovascular disease: Secondary | ICD-10-CM | POA: Diagnosis not present

## 2018-02-23 DIAGNOSIS — I6789 Other cerebrovascular disease: Secondary | ICD-10-CM | POA: Diagnosis not present

## 2018-02-24 DIAGNOSIS — I6789 Other cerebrovascular disease: Secondary | ICD-10-CM | POA: Diagnosis not present

## 2018-02-25 DIAGNOSIS — I6789 Other cerebrovascular disease: Secondary | ICD-10-CM | POA: Diagnosis not present

## 2018-02-26 DIAGNOSIS — I6789 Other cerebrovascular disease: Secondary | ICD-10-CM | POA: Diagnosis not present

## 2018-02-27 DIAGNOSIS — I6789 Other cerebrovascular disease: Secondary | ICD-10-CM | POA: Diagnosis not present

## 2018-02-28 ENCOUNTER — Institutional Professional Consult (permissible substitution): Payer: Medicare HMO | Admitting: Cardiovascular Disease

## 2018-02-28 DIAGNOSIS — I6789 Other cerebrovascular disease: Secondary | ICD-10-CM | POA: Diagnosis not present

## 2018-03-01 ENCOUNTER — Ambulatory Visit (HOSPITAL_COMMUNITY)
Admission: RE | Admit: 2018-03-01 | Discharge: 2018-03-01 | Disposition: A | Discharge status: Home / Self Care | Payer: Medicare HMO | Source: Ambulatory Visit | Attending: Cardiovascular Disease | Admitting: Cardiovascular Disease

## 2018-03-01 DIAGNOSIS — I739 Peripheral vascular disease, unspecified: Secondary | ICD-10-CM | POA: Insufficient documentation

## 2018-03-01 DIAGNOSIS — I6789 Other cerebrovascular disease: Secondary | ICD-10-CM | POA: Diagnosis not present

## 2018-03-01 DIAGNOSIS — R0989 Other specified symptoms and signs involving the circulatory and respiratory systems: Secondary | ICD-10-CM | POA: Insufficient documentation

## 2018-03-02 DIAGNOSIS — I6789 Other cerebrovascular disease: Secondary | ICD-10-CM | POA: Diagnosis not present

## 2018-03-03 DIAGNOSIS — I6789 Other cerebrovascular disease: Secondary | ICD-10-CM | POA: Diagnosis not present

## 2018-03-04 DIAGNOSIS — I6789 Other cerebrovascular disease: Secondary | ICD-10-CM | POA: Diagnosis not present

## 2018-03-05 DIAGNOSIS — I6789 Other cerebrovascular disease: Secondary | ICD-10-CM | POA: Diagnosis not present

## 2018-03-06 ENCOUNTER — Telehealth: Payer: Self-pay | Admitting: *Deleted

## 2018-03-06 DIAGNOSIS — I6789 Other cerebrovascular disease: Secondary | ICD-10-CM | POA: Diagnosis not present

## 2018-03-06 NOTE — Telephone Encounter (Signed)
-----   Message from Trula Slade, DPM sent at 03/05/2018  9:58 AM EDT ----- Tivis Ringer- please let her know that the vascular ultrasound did shoe significant disease in both legs and she should keep her appointment with Dr. Fletcher Anon this week.

## 2018-03-06 NOTE — Telephone Encounter (Signed)
I informed Kathleen [name] of Dr. Leigh Aurora review of results and orders. I reminded Kathleen [name] of the 03/07/2018 9:20am appt with Dr. Fletcher Anon.

## 2018-03-07 ENCOUNTER — Encounter: Payer: Self-pay | Admitting: Cardiovascular Disease

## 2018-03-07 ENCOUNTER — Ambulatory Visit (INDEPENDENT_AMBULATORY_CARE_PROVIDER_SITE_OTHER): Payer: Medicare HMO | Admitting: Cardiovascular Disease

## 2018-03-07 VITALS — BP 116/70 | HR 71 | Ht 64.0 in | Wt 239.2 lb

## 2018-03-07 DIAGNOSIS — Z720 Tobacco use: Secondary | ICD-10-CM | POA: Diagnosis not present

## 2018-03-07 DIAGNOSIS — Z6841 Body Mass Index (BMI) 40.0 and over, adult: Secondary | ICD-10-CM | POA: Diagnosis not present

## 2018-03-07 DIAGNOSIS — E785 Hyperlipidemia, unspecified: Secondary | ICD-10-CM | POA: Diagnosis not present

## 2018-03-07 DIAGNOSIS — N183 Chronic kidney disease, stage 3 (moderate): Secondary | ICD-10-CM | POA: Diagnosis not present

## 2018-03-07 DIAGNOSIS — I6789 Other cerebrovascular disease: Secondary | ICD-10-CM | POA: Diagnosis not present

## 2018-03-07 DIAGNOSIS — I2510 Atherosclerotic heart disease of native coronary artery without angina pectoris: Secondary | ICD-10-CM | POA: Diagnosis not present

## 2018-03-07 DIAGNOSIS — I129 Hypertensive chronic kidney disease with stage 1 through stage 4 chronic kidney disease, or unspecified chronic kidney disease: Secondary | ICD-10-CM | POA: Diagnosis not present

## 2018-03-07 DIAGNOSIS — I482 Chronic atrial fibrillation, unspecified: Secondary | ICD-10-CM

## 2018-03-07 DIAGNOSIS — I739 Peripheral vascular disease, unspecified: Secondary | ICD-10-CM

## 2018-03-07 NOTE — Progress Notes (Signed)
HPI The patient presents for evaluation of atrial fib. Since I last saw her she was in the hospital with the flu last fall.  She had sepsis.  I reviewed these records for this visit.  She did have some volume overload apparently mild.  She had atrial fibrillation with rate control.  Cardiology was not involved during that admission.  She was sent home without glipizide and without her ARB although I see that this has since been restarted.   She saw Dr. Fletcher Anon for PVD yesterda.  Noninvasive vascular studies in our office showed an ABI of 0.67 on the right and 0.76 on the left.  Duplex showed significant right mid SFA stenosis with a peak velocity of 400.  On the left, there was significant disease in the mid and distal SFA.  There was mild to moderate common femoral artery disease.  She was managed conservatively.  She has been on home O2 at night.  She has some dyspnea when she does slight activity such as walking short distance on level ground.  This is been really since she had the flu.  She has not had any new PND or orthopnea.  She has not had any new palpitations, presyncope or syncope.  Most recent labs showed a creatinine of 1.97.  She is a Press photographer.     Allergies  Allergen Reactions  . Penicillins Other (See Comments)    unknown    Current Outpatient Medications  Medication Sig Dispense Refill  . albuterol (PROVENTIL HFA;VENTOLIN HFA) 108 (90 Base) MCG/ACT inhaler Inhale 1-2 puffs into the lungs every 4 (four) hours as needed for wheezing or shortness of breath. 1 Inhaler 0  . albuterol (PROVENTIL) (2.5 MG/3ML) 0.083% nebulizer solution Take 2.5 mg by nebulization every 6 (six) hours as needed for wheezing or shortness of breath.    . allopurinol (ZYLOPRIM) 100 MG tablet Take 100 mg by mouth daily.    Marland Kitchen COLCRYS 0.6 MG tablet Take 0.6 mg by mouth daily as needed (gout).     . cyclobenzaprine (FLEXERIL) 10 MG tablet Take 10 mg by mouth 3 (three) times daily as needed for muscle  spasms.    Marland Kitchen docusate sodium (COLACE) 100 MG capsule Take 100 mg by mouth 2 (two) times daily as needed for mild constipation.    Marland Kitchen ELIQUIS 5 MG TABS tablet take 1 tablet by mouth twice a day 60 tablet 1  . famotidine (PEPCID) 20 MG tablet Take 20 mg by mouth at bedtime.     . furosemide (LASIX) 80 MG tablet Take 80 mg by mouth 2 (two) times daily.    Marland Kitchen guaiFENesin (MUCINEX) 600 MG 12 hr tablet Take 600 mg by mouth 2 (two) times daily as needed (congestion).    . insulin glargine (LANTUS) 100 UNIT/ML injection Inject 10 Units into the skin at bedtime.     . irbesartan (AVAPRO) 75 MG tablet Take 75 mg by mouth daily.    . isosorbide-hydrALAZINE (BIDIL) 20-37.5 MG per tablet Take 1 tablet by mouth 3 (three) times daily. 90 tablet 9  . metoprolol tartrate (LOPRESSOR) 25 MG tablet TAKE 3 TABLETS BY MOUTH TWICE A DAY 90 tablet 0  . pravastatin (PRAVACHOL) 80 MG tablet Take 1 tablet (80 mg total) by mouth daily. 30 tablet 6  . SYMBICORT 80-4.5 MCG/ACT inhaler Inhale 2 puffs into the lungs 2 (two) times daily.  0   No current facility-administered medications for this visit.     Past Medical  History:  Diagnosis Date  . Arthritis   . Blood transfusion    no side affects  . CHF (congestive heart failure) ([place])   . Coronary artery disease    Cypher stent to the RCA in 2003  . Diabetes mellitus    Borderline  . GERD (gastroesophageal reflux disease)   . Hypertension   . Shortness of breath     Past Surgical History:  Procedure Laterality Date  . CARDIOVERSION N/A 03/17/2015   Procedure: CARDIOVERSION;  Surgeon: Sueanne Margarita, MD;  Location: [facility];  Service: Cardiovascular;  Laterality: N/A;  . CHOLECYSTECTOMY    . CORONARY ANGIOPLASTY WITH STENT PLACEMENT    . TOTAL KNEE ARTHROPLASTY      ROS:    As stated in the HPI and negative for all other systems.   PHYSICAL EXAM BP 120/60   Pulse 72   Ht 5\' 4"  (1.626 m)   Wt 239 lb (108.4 kg)   BMI 41.02 kg/m   GENERAL:  Well  appearing NECK:  No jugular venous distention, waveform within normal limits, carotid upstroke brisk and symmetric, no bruits, no thyromegaly LUNGS:  Clear to auscultation bilaterally CHEST:  Unremarkable HEART:  PMI not displaced or sustained,S1 and S2 within normal limits, no S3, no S4, no clicks, no rubs, 2/6 apical systolic murmur, no diastolic murmurs ABD:  Flat, positive bowel sounds normal in frequency in pitch, no bruits, no rebound, no guarding, no midline pulsatile mass, no hepatomegaly, no splenomegaly EXT:  2 plus pulses throughout, mild leg edema, no cyanosis no clubbing    ASSESSMENT AND PLAN  ATRIAL FIB/FLUTTER She tolerates this rhythm without symptoms.  Tolerates anticoagulation.  No change in therapy is indicated.   CKD Her creatinine was stable as written above.  CAD -  The patient has no new sypmtoms.  No further cardiovascular testing is indicated.  We will continue with aggressive risk reduction and meds as listed.  HYPERTENSION, UNSPECIFIED -  The blood pressure is  at target. She will continue the meds as listed.  OBESITY -  We have previously discussed the need to lose weight.    Carotid stenosis - She has had mild plaque.  I will not change her therapy.   DYSPNEA - I will check an echocardiogram to evaluate although I suspect this is not heart failure but rather is multifactorial and related to her recent severe septic episode.

## 2018-03-07 NOTE — Patient Instructions (Addendum)
Medication Instructions: Your physician recommends that you continue on your current medications as directed. Please refer to the Current Medication list given to you today.  If you need a refill on your cardiac medications before your next appointment, please call your pharmacy.    Follow-Up: Your physician wants you to follow-up in: 6 months with Dr. Fletcher Anon. An appointment has been made for 08/22/18 at 11:40     Thank you for choosing [facility] at [facility]!!

## 2018-03-07 NOTE — Progress Notes (Signed)
Cardiology Office Note   Date:  03/07/2018   ID:  Kathleen [name], DOB 04-28-1946, MRN [id number]  PCP:  Nolene Ebbs, MD  Cardiologist:  Dr. Percival Spanish  No chief complaint on file.     History of Present Illness: Kathleen [name] is a 72 y.o. female who was referred by Dr. Jacqualyn Posey for evaluation and management of peripheral arterial disease. She is followed by Dr. Percival Spanish for chronic medical conditions that include paroxysmal atrial fibrillation, chronic diastolic heart failure and coronary artery disease status post RCA stent in 2003.  She has other chronic medical conditions that include type 2 diabetes, chronic kidney disease, hypertension and hyperlipidemia. She was seen recently by Dr. Jacqualyn Posey for swelling in both feet as well as corns and calluses.  She reported burning and tingling in both feet.  She had positive screening for PAD and thus she was referred. Noninvasive vascular studies in our office showed an ABI of 0.67 on the right and 0.76 on the left.  Duplex showed significant right mid SFA stenosis with a peak velocity of 400.  On the left, there was significant disease in the mid and distal SFA.  There was mild to moderate common femoral artery disease. Most recent labs showed a creatinine of 1.97. The patient's functional capacity is limited by shortness of breath.  She does not walk far.  She denies calf claudication although she does have mild bilateral foot discomfort.  She does not feel limited by this. She quit smoking 4 months ago.   Past Medical History:  Diagnosis Date  . Arthritis   . Blood transfusion    no side affects  . CHF (congestive heart failure) ([place])   . Coronary artery disease    Cypher stent to the RCA in 2003  . Diabetes mellitus    Borderline  . GERD (gastroesophageal reflux disease)   . Hypertension   . Shortness of breath     Past Surgical History:  Procedure Laterality Date  . CARDIOVERSION N/A 03/17/2015   Procedure:  CARDIOVERSION;  Surgeon: Sueanne Margarita, MD;  Location: [facility];  Service: Cardiovascular;  Laterality: N/A;  . CHOLECYSTECTOMY    . CORONARY ANGIOPLASTY WITH STENT PLACEMENT    . TOTAL KNEE ARTHROPLASTY       Current Outpatient Medications  Medication Sig Dispense Refill  . albuterol (PROVENTIL HFA;VENTOLIN HFA) 108 (90 Base) MCG/ACT inhaler Inhale 1-2 puffs into the lungs every 4 (four) hours as needed for wheezing or shortness of breath. 1 Inhaler 0  . albuterol (PROVENTIL) (2.5 MG/3ML) 0.083% nebulizer solution Take 2.5 mg by nebulization every 6 (six) hours as needed for wheezing or shortness of breath.    . allopurinol (ZYLOPRIM) 100 MG tablet Take 100 mg by mouth daily.    Marland Kitchen COLCRYS 0.6 MG tablet Take 0.6 mg by mouth daily as needed (gout).     . cyclobenzaprine (FLEXERIL) 10 MG tablet Take 10 mg by mouth 3 (three) times daily as needed for muscle spasms.    Marland Kitchen docusate sodium (COLACE) 100 MG capsule Take 100 mg by mouth 2 (two) times daily as needed for mild constipation.    Marland Kitchen ELIQUIS 5 MG TABS tablet take 1 tablet by mouth twice a day 60 tablet 1  . famotidine (PEPCID) 20 MG tablet Take 20 mg by mouth at bedtime.     . furosemide (LASIX) 80 MG tablet Take 80 mg by mouth 2 (two) times daily.    Marland Kitchen guaiFENesin (MUCINEX) 600 MG  12 hr tablet Take 600 mg by mouth 2 (two) times daily as needed (congestion).    . insulin glargine (LANTUS) 100 UNIT/ML injection Inject 10 Units into the skin at bedtime.     . irbesartan (AVAPRO) 75 MG tablet Take 75 mg by mouth daily.    . isosorbide-hydrALAZINE (BIDIL) 20-37.5 MG per tablet Take 1 tablet by mouth 3 (three) times daily. 90 tablet 9  . metoprolol tartrate (LOPRESSOR) 25 MG tablet TAKE 3 TABLETS BY MOUTH TWICE A DAY 90 tablet 0  . SYMBICORT 80-4.5 MCG/ACT inhaler Inhale 2 puffs into the lungs 2 (two) times daily.  0  . pravastatin (PRAVACHOL) 80 MG tablet Take 1 tablet (80 mg total) by mouth daily. 30 tablet 6   No current  facility-administered medications for this visit.     Allergies:   Penicillins    Social History:  The patient  reports that she quit smoking about 4 years ago. Her smoking use included cigarettes. She has a 9.00 pack-year smoking history. She has never used smokeless tobacco. She reports that she does not drink alcohol or use drugs.   Family History:  The patient's family history includes Arthritis in her father; Gout in her father; Heart attack in her brother and father; Heart disease in her father; Hypertension in her brother, father, and sister.    ROS:  Please see the history of present illness.   Otherwise, review of systems are positive for none.   All other systems are reviewed and negative.    PHYSICAL EXAM: VS:  BP 116/70   Pulse 71   Ht 5\' 4"  (1.626 m)   Wt 239 lb 3.2 oz (108.5 kg)   BMI 41.06 kg/m  , BMI Body mass index is 41.06 kg/m. GEN: Well nourished, well developed, in no acute distress  HEENT: normal  Neck: no JVD, carotid bruits, or masses Cardiac: Irregularly irregular; no  rubs, or gallops,no edema .  2 out of 6 holosystolic murmur at the apex and left sternal border Respiratory:  clear to auscultation bilaterally, normal work of breathing GI: soft, nontender, nondistended, + BS MS: no deformity or atrophy  Skin: warm and dry, no rash Neuro:  Strength and sensation are intact Psych: euthymic mood, full affect   EKG:  EKG is not ordered today.    Recent Labs: 12/05/2017: ALT 40; BUN 55; Creatinine, Ser 1.97; Hemoglobin 11.3; Magnesium 2.1; Platelets 146; Potassium 3.6; Sodium 136    Lipid Panel    Component Value Date/Time   CHOL 158 01/25/2015 0334   TRIG 77 01/25/2015 0334   HDL 46 01/25/2015 0334   CHOLHDL 3.4 01/25/2015 0334   VLDL 15 01/25/2015 0334   LDLCALC 97 01/25/2015 0334      Wt Readings from Last 3 Encounters:  03/07/18 239 lb 3.2 oz (108.5 kg)  12/06/17 251 lb 5.2 oz (114 kg)  10/05/16 251 lb 12.8 oz (114.2 kg)        No  flowsheet data found.    ASSESSMENT AND PLAN:  1.  Peripheral arterial disease: The patient has evidence of bilateral SFA disease with mildly to moderately reduced ABI.  Her current symptoms include only mild bilateral foot pain which might be atypical claudication.  She has no thigh or calf claudication and functional capacity is overall reduced due to comorbidities and shortness of breath. There is no evidence of critical limb ischemia.  Due to all of that, there is no indication for revascularization.  I discussed with her  the importance of proper foot hygiene.  Follow-up with me in 6 months or earlier if needed. Surgeries on the foot should be avoided as much as possible. The patient does have advanced chronic kidney disease which makes angiography high risk for contrast-induced nephropathy and should be reserved as a last resort for critical limb ischemia.  2.  Atrial fibrillation: Ventricular rate is controlled with metoprolol.  She is on long-term anticoagulation with Eliquis.  3.  Coronary artery disease: Other than shortness of breath, she denies anginal symptoms.  4.  Hyperlipidemia: Continue treatment with pravastatin with a target LDL of less than 70.     Disposition:   FU with me in 6 months  Signed,  Kathlyn Sacramento, MD  03/07/2018 9:46 AM    [place]

## 2018-03-08 ENCOUNTER — Ambulatory Visit (INDEPENDENT_AMBULATORY_CARE_PROVIDER_SITE_OTHER): Payer: Medicare HMO | Admitting: Cardiology

## 2018-03-08 ENCOUNTER — Encounter: Payer: Self-pay | Admitting: Cardiology

## 2018-03-08 VITALS — BP 120/60 | HR 72 | Ht 64.0 in | Wt 239.0 lb

## 2018-03-08 DIAGNOSIS — I5032 Chronic diastolic (congestive) heart failure: Secondary | ICD-10-CM

## 2018-03-08 DIAGNOSIS — I482 Chronic atrial fibrillation, unspecified: Secondary | ICD-10-CM

## 2018-03-08 DIAGNOSIS — R0602 Shortness of breath: Secondary | ICD-10-CM

## 2018-03-08 DIAGNOSIS — N183 Chronic kidney disease, stage 3 unspecified: Secondary | ICD-10-CM

## 2018-03-08 DIAGNOSIS — I6789 Other cerebrovascular disease: Secondary | ICD-10-CM | POA: Diagnosis not present

## 2018-03-08 NOTE — Patient Instructions (Signed)
Medication Instructions:  Continue current medications  If you need a refill on your cardiac medications before your next appointment, please call your pharmacy.  Labwork: None Ordered   Testing/Procedures: Your physician has requested that you have an echocardiogram. Echocardiography is a painless test that uses sound waves to create images of your heart. It provides your doctor with information about the size and shape of your heart and how well your heart's chambers and valves are working. This procedure takes approximately one hour. There are no restrictions for this procedure.  Follow-Up: Your physician wants you to follow-up in: 1 Year. You should receive a reminder letter in the mail two months in advance. If you do not receive a letter, please call our office [phone].    Thank you for choosing [facility]!!

## 2018-03-09 DIAGNOSIS — I6789 Other cerebrovascular disease: Secondary | ICD-10-CM | POA: Diagnosis not present

## 2018-03-10 DIAGNOSIS — I6789 Other cerebrovascular disease: Secondary | ICD-10-CM | POA: Diagnosis not present

## 2018-03-11 DIAGNOSIS — I6789 Other cerebrovascular disease: Secondary | ICD-10-CM | POA: Diagnosis not present

## 2018-03-12 DIAGNOSIS — J449 Chronic obstructive pulmonary disease, unspecified: Secondary | ICD-10-CM | POA: Diagnosis not present

## 2018-03-12 DIAGNOSIS — I6789 Other cerebrovascular disease: Secondary | ICD-10-CM | POA: Diagnosis not present

## 2018-03-13 DIAGNOSIS — I6789 Other cerebrovascular disease: Secondary | ICD-10-CM | POA: Diagnosis not present

## 2018-03-14 DIAGNOSIS — I6789 Other cerebrovascular disease: Secondary | ICD-10-CM | POA: Diagnosis not present

## 2018-03-15 DIAGNOSIS — I6789 Other cerebrovascular disease: Secondary | ICD-10-CM | POA: Diagnosis not present

## 2018-03-16 DIAGNOSIS — I6789 Other cerebrovascular disease: Secondary | ICD-10-CM | POA: Diagnosis not present

## 2018-03-16 DIAGNOSIS — N183 Chronic kidney disease, stage 3 (moderate): Secondary | ICD-10-CM | POA: Diagnosis not present

## 2018-03-17 DIAGNOSIS — E7849 Other hyperlipidemia: Secondary | ICD-10-CM | POA: Diagnosis not present

## 2018-03-17 DIAGNOSIS — E1159 Type 2 diabetes mellitus with other circulatory complications: Secondary | ICD-10-CM | POA: Diagnosis not present

## 2018-03-17 DIAGNOSIS — J449 Chronic obstructive pulmonary disease, unspecified: Secondary | ICD-10-CM | POA: Diagnosis not present

## 2018-03-17 DIAGNOSIS — I5032 Chronic diastolic (congestive) heart failure: Secondary | ICD-10-CM | POA: Diagnosis not present

## 2018-03-17 DIAGNOSIS — M1000 Idiopathic gout, unspecified site: Secondary | ICD-10-CM | POA: Diagnosis not present

## 2018-03-17 DIAGNOSIS — I10 Essential (primary) hypertension: Secondary | ICD-10-CM | POA: Diagnosis not present

## 2018-03-17 DIAGNOSIS — I2510 Atherosclerotic heart disease of native coronary artery without angina pectoris: Secondary | ICD-10-CM | POA: Diagnosis not present

## 2018-03-17 DIAGNOSIS — I6789 Other cerebrovascular disease: Secondary | ICD-10-CM | POA: Diagnosis not present

## 2018-03-18 DIAGNOSIS — I6789 Other cerebrovascular disease: Secondary | ICD-10-CM | POA: Diagnosis not present

## 2018-03-19 DIAGNOSIS — I6789 Other cerebrovascular disease: Secondary | ICD-10-CM | POA: Diagnosis not present

## 2018-03-20 DIAGNOSIS — I6789 Other cerebrovascular disease: Secondary | ICD-10-CM | POA: Diagnosis not present

## 2018-03-21 DIAGNOSIS — I6789 Other cerebrovascular disease: Secondary | ICD-10-CM | POA: Diagnosis not present

## 2018-03-22 DIAGNOSIS — J449 Chronic obstructive pulmonary disease, unspecified: Secondary | ICD-10-CM | POA: Diagnosis not present

## 2018-03-22 DIAGNOSIS — I6789 Other cerebrovascular disease: Secondary | ICD-10-CM | POA: Diagnosis not present

## 2018-03-23 DIAGNOSIS — I6789 Other cerebrovascular disease: Secondary | ICD-10-CM | POA: Diagnosis not present

## 2018-03-23 DIAGNOSIS — J449 Chronic obstructive pulmonary disease, unspecified: Secondary | ICD-10-CM | POA: Diagnosis not present

## 2018-03-24 ENCOUNTER — Ambulatory Visit (HOSPITAL_COMMUNITY): Payer: Medicare HMO | Attending: Cardiology

## 2018-03-24 ENCOUNTER — Other Ambulatory Visit: Payer: Self-pay

## 2018-03-24 DIAGNOSIS — I5032 Chronic diastolic (congestive) heart failure: Secondary | ICD-10-CM | POA: Diagnosis not present

## 2018-03-24 DIAGNOSIS — I482 Chronic atrial fibrillation, unspecified: Secondary | ICD-10-CM

## 2018-03-24 DIAGNOSIS — Z8249 Family history of ischemic heart disease and other diseases of the circulatory system: Secondary | ICD-10-CM | POA: Diagnosis not present

## 2018-03-24 DIAGNOSIS — I080 Rheumatic disorders of both mitral and aortic valves: Secondary | ICD-10-CM | POA: Insufficient documentation

## 2018-03-24 DIAGNOSIS — I110 Hypertensive heart disease with heart failure: Secondary | ICD-10-CM | POA: Insufficient documentation

## 2018-03-24 DIAGNOSIS — I2510 Atherosclerotic heart disease of native coronary artery without angina pectoris: Secondary | ICD-10-CM | POA: Diagnosis not present

## 2018-03-24 DIAGNOSIS — Z87891 Personal history of nicotine dependence: Secondary | ICD-10-CM | POA: Diagnosis not present

## 2018-03-24 DIAGNOSIS — E119 Type 2 diabetes mellitus without complications: Secondary | ICD-10-CM | POA: Insufficient documentation

## 2018-03-24 DIAGNOSIS — I6789 Other cerebrovascular disease: Secondary | ICD-10-CM | POA: Diagnosis not present

## 2018-03-25 DIAGNOSIS — I6789 Other cerebrovascular disease: Secondary | ICD-10-CM | POA: Diagnosis not present

## 2018-03-26 DIAGNOSIS — I6789 Other cerebrovascular disease: Secondary | ICD-10-CM | POA: Diagnosis not present

## 2018-03-27 DIAGNOSIS — I6789 Other cerebrovascular disease: Secondary | ICD-10-CM | POA: Diagnosis not present

## 2018-03-28 DIAGNOSIS — I6789 Other cerebrovascular disease: Secondary | ICD-10-CM | POA: Diagnosis not present

## 2018-03-29 DIAGNOSIS — I6789 Other cerebrovascular disease: Secondary | ICD-10-CM | POA: Diagnosis not present

## 2018-03-30 DIAGNOSIS — I6789 Other cerebrovascular disease: Secondary | ICD-10-CM | POA: Diagnosis not present

## 2018-03-31 DIAGNOSIS — I6789 Other cerebrovascular disease: Secondary | ICD-10-CM | POA: Diagnosis not present

## 2018-04-01 DIAGNOSIS — I6789 Other cerebrovascular disease: Secondary | ICD-10-CM | POA: Diagnosis not present

## 2018-04-02 DIAGNOSIS — I6789 Other cerebrovascular disease: Secondary | ICD-10-CM | POA: Diagnosis not present

## 2018-04-03 DIAGNOSIS — I6789 Other cerebrovascular disease: Secondary | ICD-10-CM | POA: Diagnosis not present

## 2018-04-04 DIAGNOSIS — I6789 Other cerebrovascular disease: Secondary | ICD-10-CM | POA: Diagnosis not present

## 2018-04-05 DIAGNOSIS — I6789 Other cerebrovascular disease: Secondary | ICD-10-CM | POA: Diagnosis not present

## 2018-04-06 DIAGNOSIS — I6789 Other cerebrovascular disease: Secondary | ICD-10-CM | POA: Diagnosis not present

## 2018-04-07 DIAGNOSIS — I6789 Other cerebrovascular disease: Secondary | ICD-10-CM | POA: Diagnosis not present

## 2018-04-08 DIAGNOSIS — I6789 Other cerebrovascular disease: Secondary | ICD-10-CM | POA: Diagnosis not present

## 2018-04-09 DIAGNOSIS — I6789 Other cerebrovascular disease: Secondary | ICD-10-CM | POA: Diagnosis not present

## 2018-04-10 DIAGNOSIS — I6789 Other cerebrovascular disease: Secondary | ICD-10-CM | POA: Diagnosis not present

## 2018-04-11 DIAGNOSIS — I6789 Other cerebrovascular disease: Secondary | ICD-10-CM | POA: Diagnosis not present

## 2018-04-11 DIAGNOSIS — J449 Chronic obstructive pulmonary disease, unspecified: Secondary | ICD-10-CM | POA: Diagnosis not present

## 2018-05-18 ENCOUNTER — Encounter: Payer: Self-pay | Admitting: Podiatry

## 2018-05-18 ENCOUNTER — Ambulatory Visit (INDEPENDENT_AMBULATORY_CARE_PROVIDER_SITE_OTHER): Payer: 59 | Admitting: Podiatry

## 2018-05-18 DIAGNOSIS — M79675 Pain in left toe(s): Secondary | ICD-10-CM | POA: Diagnosis not present

## 2018-05-18 DIAGNOSIS — E1149 Type 2 diabetes mellitus with other diabetic neurological complication: Secondary | ICD-10-CM

## 2018-05-18 DIAGNOSIS — M79674 Pain in right toe(s): Secondary | ICD-10-CM

## 2018-05-18 DIAGNOSIS — Q828 Other specified congenital malformations of skin: Secondary | ICD-10-CM

## 2018-05-18 DIAGNOSIS — B351 Tinea unguium: Secondary | ICD-10-CM

## 2018-05-18 DIAGNOSIS — Z7901 Long term (current) use of anticoagulants: Secondary | ICD-10-CM

## 2018-05-21 NOTE — Progress Notes (Signed)
Subjective: 72 y.o. returns the office today for painful, elongated, thickened toenails which she cannot trim herself. Denies any redness or drainage around the nails.  She also gets painful calluses to her feet that should have trimmed and denies any redness or drainage to the callus sites.  Denies any acute changes since last appointment and no new complaints today. Denies any systemic complaints such as fevers, chills, nausea, vomiting.   She did follow-up with Dr. Fletcher Anon for her circulation.  PCP: Nolene Ebbs, MD  Objective: AAO 3, NAD DP pulses 1/4, PT pulse 0/4.  Chronic lower extremity edema is present. Protective sensation decreased with Derrel Nip monofilament Nails hypertrophic, dystrophic, elongated, brittle, discolored 10. There is tenderness overlying the nails 1-5 bilaterally. There is no surrounding erythema or drainage along the nail sites. Hyperkeratotic lesions bilateral hallux, right submetatarsal 2 as well as bilateral heels.  Upon debridement there is no underlying ulceration, drainage or any signs of infection. No open lesions or pre-ulcerative lesions are identified. No other areas of tenderness bilateral lower extremities. No overlying edema, erythema, increased warmth. No pain with calf compression, swelling, warmth, erythema.  Assessment: Patient presents with symptomatic onychomycosis, symptomatic hyperkeratotic lesions.  Plan: -Treatment options including alternatives, risks, complications were discussed -Nails sharply debrided 10 without complication/bleeding. -Hyperkeratotic lesion sharply debrided x5 without any complications or bleeding. -Discussed daily foot inspection. If there are any changes, to call the office immediately.  -Follow-up in 3 months or sooner if any problems are to arise. In the meantime, encouraged to call the office with any questions, concerns, changes symptoms.  Celesta Gentile, DPM

## 2018-06-04 ENCOUNTER — Encounter (HOSPITAL_COMMUNITY): Payer: Self-pay | Admitting: Emergency Medicine

## 2018-06-04 ENCOUNTER — Observation Stay (HOSPITAL_COMMUNITY)
Admission: EM | Admit: 2018-06-04 | Discharge: 2018-06-07 | Disposition: A | Discharge status: Home / Self Care | Payer: Medicare Other | Attending: Internal Medicine | Admitting: Internal Medicine

## 2018-06-04 ENCOUNTER — Other Ambulatory Visit: Payer: Self-pay

## 2018-06-04 DIAGNOSIS — K921 Melena: Secondary | ICD-10-CM

## 2018-06-04 DIAGNOSIS — K2980 Duodenitis without bleeding: Secondary | ICD-10-CM | POA: Diagnosis not present

## 2018-06-04 DIAGNOSIS — Z79899 Other long term (current) drug therapy: Secondary | ICD-10-CM | POA: Insufficient documentation

## 2018-06-04 DIAGNOSIS — N184 Chronic kidney disease, stage 4 (severe): Secondary | ICD-10-CM | POA: Diagnosis not present

## 2018-06-04 DIAGNOSIS — Z880 Allergy status to penicillin: Secondary | ICD-10-CM | POA: Diagnosis not present

## 2018-06-04 DIAGNOSIS — E1122 Type 2 diabetes mellitus with diabetic chronic kidney disease: Secondary | ICD-10-CM | POA: Insufficient documentation

## 2018-06-04 DIAGNOSIS — K222 Esophageal obstruction: Secondary | ICD-10-CM | POA: Insufficient documentation

## 2018-06-04 DIAGNOSIS — K31811 Angiodysplasia of stomach and duodenum with bleeding: Secondary | ICD-10-CM | POA: Diagnosis not present

## 2018-06-04 DIAGNOSIS — I5032 Chronic diastolic (congestive) heart failure: Secondary | ICD-10-CM | POA: Diagnosis not present

## 2018-06-04 DIAGNOSIS — D62 Acute posthemorrhagic anemia: Secondary | ICD-10-CM | POA: Diagnosis not present

## 2018-06-04 DIAGNOSIS — I481 Persistent atrial fibrillation: Secondary | ICD-10-CM | POA: Insufficient documentation

## 2018-06-04 DIAGNOSIS — E876 Hypokalemia: Secondary | ICD-10-CM | POA: Insufficient documentation

## 2018-06-04 DIAGNOSIS — I482 Chronic atrial fibrillation, unspecified: Secondary | ICD-10-CM

## 2018-06-04 DIAGNOSIS — I10 Essential (primary) hypertension: Secondary | ICD-10-CM | POA: Diagnosis present

## 2018-06-04 DIAGNOSIS — K2950 Unspecified chronic gastritis without bleeding: Secondary | ICD-10-CM | POA: Diagnosis not present

## 2018-06-04 DIAGNOSIS — Z87891 Personal history of nicotine dependence: Secondary | ICD-10-CM | POA: Diagnosis not present

## 2018-06-04 DIAGNOSIS — K922 Gastrointestinal hemorrhage, unspecified: Secondary | ICD-10-CM | POA: Diagnosis present

## 2018-06-04 DIAGNOSIS — M1990 Unspecified osteoarthritis, unspecified site: Secondary | ICD-10-CM | POA: Diagnosis not present

## 2018-06-04 DIAGNOSIS — I252 Old myocardial infarction: Secondary | ICD-10-CM | POA: Diagnosis not present

## 2018-06-04 DIAGNOSIS — Z955 Presence of coronary angioplasty implant and graft: Secondary | ICD-10-CM | POA: Diagnosis not present

## 2018-06-04 DIAGNOSIS — K219 Gastro-esophageal reflux disease without esophagitis: Secondary | ICD-10-CM | POA: Insufficient documentation

## 2018-06-04 DIAGNOSIS — E1151 Type 2 diabetes mellitus with diabetic peripheral angiopathy without gangrene: Secondary | ICD-10-CM | POA: Insufficient documentation

## 2018-06-04 DIAGNOSIS — E119 Type 2 diabetes mellitus without complications: Secondary | ICD-10-CM

## 2018-06-04 DIAGNOSIS — E785 Hyperlipidemia, unspecified: Secondary | ICD-10-CM | POA: Diagnosis not present

## 2018-06-04 DIAGNOSIS — I130 Hypertensive heart and chronic kidney disease with heart failure and stage 1 through stage 4 chronic kidney disease, or unspecified chronic kidney disease: Secondary | ICD-10-CM | POA: Insufficient documentation

## 2018-06-04 DIAGNOSIS — I2510 Atherosclerotic heart disease of native coronary artery without angina pectoris: Secondary | ICD-10-CM | POA: Diagnosis not present

## 2018-06-04 DIAGNOSIS — Z96659 Presence of unspecified artificial knee joint: Secondary | ICD-10-CM | POA: Insufficient documentation

## 2018-06-04 DIAGNOSIS — R7989 Other specified abnormal findings of blood chemistry: Secondary | ICD-10-CM

## 2018-06-04 DIAGNOSIS — R945 Abnormal results of liver function studies: Secondary | ICD-10-CM | POA: Insufficient documentation

## 2018-06-04 DIAGNOSIS — I480 Paroxysmal atrial fibrillation: Secondary | ICD-10-CM

## 2018-06-04 DIAGNOSIS — Z7901 Long term (current) use of anticoagulants: Secondary | ICD-10-CM | POA: Insufficient documentation

## 2018-06-04 LAB — TYPE AND SCREEN
ABO/RH(D): O POS
Antibody Screen: NEGATIVE

## 2018-06-04 LAB — COMPREHENSIVE METABOLIC PANEL
ALT: 89 U/L — AB (ref 14–54)
AST: 90 U/L — AB (ref 15–41)
Albumin: 3.7 g/dL (ref 3.5–5.0)
Alkaline Phosphatase: 116 U/L (ref 38–126)
Anion gap: 12 (ref 5–15)
BUN: 90 mg/dL — AB (ref 6–20)
CO2: 30 mmol/L (ref 22–32)
CREATININE: 2.4 mg/dL — AB (ref 0.44–1.00)
Calcium: 10.1 mg/dL (ref 8.9–10.3)
Chloride: 101 mmol/L (ref 101–111)
GFR calc non Af Amer: 19 mL/min — ABNORMAL LOW (ref >60)
GFR, EST AFRICAN AMERICAN: 22 mL/min — AB (ref >60)
Glucose, Bld: 100 mg/dL — ABNORMAL HIGH (ref 65–99)
Potassium: 3.4 mmol/L — ABNORMAL LOW (ref 3.5–5.1)
SODIUM: 143 mmol/L (ref 135–145)
Total Bilirubin: 1.3 mg/dL — ABNORMAL HIGH (ref 0.3–1.2)
Total Protein: 8 g/dL (ref 6.5–8.1)

## 2018-06-04 LAB — HEMOGLOBIN AND HEMATOCRIT, BLOOD
HCT: 28.8 % — ABNORMAL LOW (ref 36.0–46.0)
HCT: 27.7 % — ABNORMAL LOW (ref 36.0–46.0)
Hemoglobin: 9 g/dL — ABNORMAL LOW (ref 12.0–15.0)
Hemoglobin: 8.8 g/dL — ABNORMAL LOW (ref 12.0–15.0)

## 2018-06-04 LAB — CBC
HEMATOCRIT: 33.1 % — AB (ref 36.0–46.0)
Hemoglobin: 10.4 g/dL — ABNORMAL LOW (ref 12.0–15.0)
MCH: 27.2 pg (ref 26.0–34.0)
MCHC: 31.4 g/dL (ref 30.0–36.0)
MCV: 86.4 fL (ref 78.0–100.0)
PLATELETS: 230 10*3/uL (ref 150–400)
RBC: 3.83 MIL/uL — AB (ref 3.87–5.11)
RDW: 18.5 % — ABNORMAL HIGH (ref 11.5–15.5)
WBC: 7.6 10*3/uL (ref 4.0–10.5)

## 2018-06-04 LAB — DIFFERENTIAL
Basophils Absolute: 0 10*3/uL (ref 0.0–0.1)
Basophils Relative: 0 %
Eosinophils Absolute: 0.1 10*3/uL (ref 0.0–0.7)
Eosinophils Relative: 1 %
LYMPHS ABS: 1.8 10*3/uL (ref 0.7–4.0)
LYMPHS PCT: 23 %
MONO ABS: 1.1 10*3/uL — AB (ref 0.1–1.0)
Monocytes Relative: 14 %
Neutro Abs: 4.7 10*3/uL (ref 1.7–7.7)
Neutrophils Relative %: 62 %

## 2018-06-04 LAB — I-STAT CHEM 8, ED
BUN: 92 mg/dL — ABNORMAL HIGH (ref 6–20)
CALCIUM ION: 1.17 mmol/L (ref 1.15–1.40)
Chloride: 97 mmol/L — ABNORMAL LOW (ref 101–111)
Creatinine, Ser: 2.3 mg/dL — ABNORMAL HIGH (ref 0.44–1.00)
GLUCOSE: 94 mg/dL (ref 65–99)
HCT: 34 % — ABNORMAL LOW (ref 36.0–46.0)
HEMOGLOBIN: 11.6 g/dL — AB (ref 12.0–15.0)
Potassium: 3.8 mmol/L (ref 3.5–5.1)
Sodium: 138 mmol/L (ref 135–145)
TCO2: 29 mmol/L (ref 22–32)

## 2018-06-04 LAB — URINALYSIS, ROUTINE W REFLEX MICROSCOPIC
Bilirubin Urine: NEGATIVE
GLUCOSE, UA: NEGATIVE mg/dL
KETONES UR: NEGATIVE mg/dL
Leukocytes, UA: NEGATIVE
Nitrite: NEGATIVE
PROTEIN: NEGATIVE mg/dL
Specific Gravity, Urine: 1.011 (ref 1.005–1.030)
pH: 5 (ref 5.0–8.0)

## 2018-06-04 LAB — POC OCCULT BLOOD, ED: Fecal Occult Bld: POSITIVE — AB

## 2018-06-04 LAB — ABO/RH: ABO/RH(D): O POS

## 2018-06-04 LAB — GLUCOSE, CAPILLARY
GLUCOSE-CAPILLARY: 79 mg/dL (ref 65–99)
Glucose-Capillary: 87 mg/dL (ref 65–99)

## 2018-06-04 LAB — LIPASE, BLOOD: LIPASE: 29 U/L (ref 11–51)

## 2018-06-04 MED ORDER — MORPHINE SULFATE (PF) 2 MG/ML IV SOLN
2.0000 mg | Freq: Once | INTRAVENOUS | Status: AC
  Administered 2018-06-04: 2 mg via INTRAVENOUS
  Filled 2018-06-04: qty 1

## 2018-06-04 MED ORDER — SODIUM CHLORIDE 0.9 % IV BOLUS
1000.0000 mL | Freq: Once | INTRAVENOUS | Status: AC
  Administered 2018-06-04: 1000 mL via INTRAVENOUS

## 2018-06-04 MED ORDER — ACETAMINOPHEN 325 MG PO TABS: 650.0000 mg | ORAL_TABLET | Freq: Four times a day (QID) | ORAL | Status: DC | PRN

## 2018-06-04 MED ORDER — INSULIN ASPART 100 UNIT/ML ~~LOC~~ SOLN
0.0000 [IU] | SUBCUTANEOUS | Status: DC
  Administered 2018-06-05 – 2018-06-06 (×2): 1 [IU] via SUBCUTANEOUS
  Administered 2018-06-06 – 2018-06-07 (×3): 2 [IU] via SUBCUTANEOUS

## 2018-06-04 MED ORDER — FAMOTIDINE IN NACL 20-0.9 MG/50ML-% IV SOLN
20.0000 mg | Freq: Once | INTRAVENOUS | Status: AC
  Administered 2018-06-04: 20 mg via INTRAVENOUS
  Filled 2018-06-04: qty 50

## 2018-06-04 MED ORDER — ONDANSETRON HCL 4 MG/2ML IJ SOLN: 4.0000 mg | Freq: Four times a day (QID) | INTRAMUSCULAR | Status: DC | PRN

## 2018-06-04 MED ORDER — PANTOPRAZOLE SODIUM 40 MG IV SOLR: 40.0000 mg | Freq: Two times a day (BID) | INTRAVENOUS | Status: DC

## 2018-06-04 MED ORDER — ONDANSETRON HCL 4 MG PO TABS: 4.0000 mg | ORAL_TABLET | Freq: Four times a day (QID) | ORAL | Status: DC | PRN

## 2018-06-04 MED ORDER — ACETAMINOPHEN 650 MG RE SUPP: 650.0000 mg | Freq: Four times a day (QID) | RECTAL | Status: DC | PRN

## 2018-06-04 MED ORDER — PANTOPRAZOLE SODIUM 40 MG IV SOLR
8.0000 mg/h | INTRAVENOUS | Status: DC
  Administered 2018-06-04 – 2018-06-05 (×2): 8 mg/h via INTRAVENOUS
  Filled 2018-06-04 (×3): qty 80

## 2018-06-04 MED ORDER — SODIUM CHLORIDE 0.9 % IV SOLN: 250.0000 mL | INTRAVENOUS | Status: DC | PRN

## 2018-06-04 MED ORDER — SODIUM CHLORIDE 0.9% FLUSH
3.0000 mL | Freq: Two times a day (BID) | INTRAVENOUS | Status: DC
  Administered 2018-06-05 – 2018-06-07 (×5): 3 mL via INTRAVENOUS

## 2018-06-04 MED ORDER — SODIUM CHLORIDE 0.9% FLUSH: 3.0000 mL | INTRAVENOUS | Status: DC | PRN

## 2018-06-04 MED ORDER — SODIUM CHLORIDE 0.9 % IV SOLN
80.0000 mg | Freq: Once | INTRAVENOUS | Status: AC
  Administered 2018-06-04: 80 mg via INTRAVENOUS
  Filled 2018-06-04: qty 80

## 2018-06-04 NOTE — Progress Notes (Signed)
Patient is complaining of spasms in her neck/shoulder, states she's had these spasms since before admission to hospital. On call doctor was paged.

## 2018-06-04 NOTE — ED Provider Notes (Signed)
[facility] DEPT Provider Note   CSN: [id number] Arrival date & time: 06/04/18  0850     History   Chief Complaint Chief Complaint  Patient presents with  . Abdominal Pain    HPI Kathleen [name] is a 72 y.o. female.  Patient states that she is been having dark and black stools for a week now.  Patient is on Eliquis for atrial fib  The history is provided by the patient. No language interpreter was used.  Rectal Bleeding  Quality:  Black and tarry Amount:  Moderate Timing:  Constant Chronicity:  New Context: not anal fissures   Similar prior episodes: no   Relieved by:  Nothing Worsened by:  Nothing   Past Medical History:  Diagnosis Date  . Arthritis   . Blood transfusion    no side affects  . CHF (congestive heart failure) ([place])   . Coronary artery disease    Cypher stent to the RCA in 2003  . Diabetes mellitus    Borderline  . GERD (gastroesophageal reflux disease)   . Hypertension   . Shortness of breath     Patient Active Problem List   Diagnosis Date Noted  . SOB (shortness of breath) 03/08/2018  . SIRS (systemic inflammatory response syndrome) ([place]) 12/04/2017  . Hypothermia 12/04/2017  . Elevated LFTs 12/04/2017  . Sepsis ([place]) 12/04/2017  . Atrial flutter ([place]) 03/17/2015  . NSTEMI (non-ST elevated myocardial infarction) ([place])   . Chronic diastolic CHF (congestive heart failure) ([place])   . Type 2 diabetes mellitus with hypoglycemia without coma ([place])   . Paroxysmal atrial fibrillation ([place]) 01/25/2015  . Non-ST elevation MI (NSTEMI) ([place]) 01/23/2015  . Diabetes mellitus type 2, controlled ([place]) 01/23/2015  . HLD (hyperlipidemia)   . Chronic kidney disease, stage III (moderate) (HCC)   . Acute congestive heart failure ([place]) 09/17/2014  . Postmenopausal bleeding 01/10/2014  . Carotid stenosis 01/04/2012  . Carpal tunnel syndrome on left 11/26/2011  . Transient ischemic attack on medication 11/02/2011  . Diabetes  mellitus ([place]) 11/02/2011  . Hyperlipidemia with target LDL less than 70 09/17/2008  . OBESITY 09/17/2008  . Essential hypertension 09/17/2008  . Coronary atherosclerosis 09/17/2008    Past Surgical History:  Procedure Laterality Date  . CARDIOVERSION N/A 03/17/2015   Procedure: CARDIOVERSION;  Surgeon: Sueanne Margarita, MD;  Location: [facility];  Service: Cardiovascular;  Laterality: N/A;  . CHOLECYSTECTOMY    . CORONARY ANGIOPLASTY WITH STENT PLACEMENT    . TOTAL KNEE ARTHROPLASTY       OB History    Gravida  9   Para  7   Term  5   Preterm  2   AB  2   Living  5     SAB  2   TAB  0   Ectopic  0   Multiple  0   Live Births  7            Home Medications    Prior to Admission medications   Medication Sig Start Date End Date Taking? Authorizing Provider  allopurinol (ZYLOPRIM) 100 MG tablet Take 100 mg by mouth daily.   Yes [provider]  COLCRYS 0.6 MG tablet Take 0.6 mg by mouth daily as needed (gout).  02/04/18  Yes [provider]  ELIQUIS 5 MG TABS tablet take 1 tablet by mouth twice a day 07/13/16  Yes Minus Breeding, MD  famotidine (PEPCID) 20 MG tablet Take 20 mg by mouth at  bedtime.  02/04/18  Yes [provider]  furosemide (LASIX) 80 MG tablet Take 80 mg by mouth 2 (two) times daily.   Yes [provider]  guaiFENesin (MUCINEX) 600 MG 12 hr tablet Take 600 mg by mouth 2 (two) times daily as needed (congestion).   Yes [provider]  isosorbide-hydrALAZINE (BIDIL) 20-37.5 MG per tablet Take 1 tablet by mouth 3 (three) times daily. 03/10/15  Yes Hochrein, Jeneen Rinks, MD  LANTUS SOLOSTAR 100 UNIT/ML Solostar Pen INJ 10 UNITS [place] QAM 03/17/18  Yes [provider]  metoprolol tartrate (LOPRESSOR) 25 MG tablet TAKE 3 TABLETS BY MOUTH TWICE A DAY Patient taking differently: TAKE 1 TABLET BY MOUTH TWICE A DAY 02/06/18  Yes Minus Breeding, MD  metoprolol tartrate (LOPRESSOR) 50 MG tablet TK 1 T PO BID 03/17/18   Yes [provider]  pravastatin (PRAVACHOL) 80 MG tablet TK 1 T PO D 03/17/18  Yes [provider]  SYMBICORT 80-4.5 MCG/ACT inhaler Inhale 2 puffs into the lungs 2 (two) times daily. 02/02/18  Yes [provider]  albuterol (PROVENTIL HFA;VENTOLIN HFA) 108 (90 Base) MCG/ACT inhaler Inhale 1-2 puffs into the lungs every 4 (four) hours as needed for wheezing or shortness of breath. 12/06/17   Mariel Aloe, MD  albuterol (PROVENTIL) (2.5 MG/3ML) 0.083% nebulizer solution Take 2.5 mg by nebulization every 6 (six) hours as needed for wheezing or shortness of breath.    [provider]  cyclobenzaprine (FLEXERIL) 10 MG tablet Take 10 mg by mouth 3 (three) times daily as needed for muscle spasms.    [provider]  docusate sodium (COLACE) 100 MG capsule Take 100 mg by mouth 2 (two) times daily as needed for mild constipation.    [provider]  pravastatin (PRAVACHOL) 80 MG tablet Take 1 tablet (80 mg total) by mouth daily. 11/23/11 03/08/18  Minus Breeding, MD    Family History Family History  Problem Relation Age of Onset  . Hypertension Father   . Heart disease Father   . Gout Father   . Arthritis Father   . Heart attack Father   . Heart attack Brother   . Hypertension Brother   . Hypertension Sister   . Stroke Neg Hx     Social History Social History   Tobacco Use  . Smoking status: Former Smoker    Packs/day: 0.30    Years: 30.00    Pack years: 9.00    Types: Cigarettes    Last attempt to quit: 01/18/2014    Years since quitting: 4.3  . Smokeless tobacco: Never Used  . Tobacco comment: Smoking cessation requested   Substance Use Topics  . Alcohol use: No  . Drug use: No     Allergies   Penicillins   Review of Systems Review of Systems  Constitutional: Negative for appetite change and fatigue.  HENT: Negative for congestion, ear discharge and sinus pressure.   Eyes: Negative for discharge.  Respiratory: Negative  for cough.   Cardiovascular: Negative for chest pain.  Gastrointestinal: Positive for hematochezia. Negative for diarrhea.       Rectal bleeding  Genitourinary: Negative for frequency and hematuria.  Musculoskeletal: Negative for back pain.  Skin: Negative for rash.  Neurological: Negative for seizures and headaches.  Psychiatric/Behavioral: Negative for hallucinations.     Physical Exam Updated Vital Signs BP (!) 150/68   Pulse 64   Temp 97.9 F (36.6 C) (Oral)   Resp (!) 27   SpO2 100%  Physical Exam  Constitutional: She is oriented to person, place, and time. She appears well-developed.  HENT:  Head: Normocephalic.  Eyes: Conjunctivae and EOM are normal. No scleral icterus.  Neck: Neck supple. No thyromegaly present.  Cardiovascular: Normal rate and regular rhythm. Exam reveals no gallop and no friction rub.  No murmur heard. Pulmonary/Chest: No stridor. She has no wheezes. She has no rales. She exhibits no tenderness.  Abdominal: She exhibits no distension. There is tenderness. There is no rebound.  Minimal tenderness throughout  Genitourinary:  Genitourinary Comments: Heme positive brown stool  Musculoskeletal: Normal range of motion. She exhibits no edema.  Lymphadenopathy:    She has no cervical adenopathy.  Neurological: She is oriented to person, place, and time. She exhibits normal muscle tone. Coordination normal.  Skin: No rash noted. No erythema.  Psychiatric: She has a normal mood and affect. Her behavior is normal.     ED Treatments / Results  Labs (all labs ordered are listed, but only abnormal results are displayed) Labs Reviewed  COMPREHENSIVE METABOLIC PANEL - Abnormal; Notable for the following components:      Result Value   Potassium 3.4 (*)    Glucose, Bld 100 (*)    BUN 90 (*)    Creatinine, Ser 2.40 (*)    AST 90 (*)    ALT 89 (*)    Total Bilirubin 1.3 (*)    GFR calc non Af Amer 19 (*)    GFR calc Af Amer 22 (*)    All other  components within normal limits  CBC - Abnormal; Notable for the following components:   RBC 3.83 (*)    Hemoglobin 10.4 (*)    HCT 33.1 (*)    RDW 18.5 (*)    All other components within normal limits  URINALYSIS, ROUTINE W REFLEX MICROSCOPIC - Abnormal; Notable for the following components:   Hgb urine dipstick SMALL (*)    Bacteria, UA RARE (*)    All other components within normal limits  DIFFERENTIAL - Abnormal; Notable for the following components:   Monocytes Absolute 1.1 (*)    All other components within normal limits  I-STAT CHEM 8, ED - Abnormal; Notable for the following components:   Chloride 97 (*)    BUN 92 (*)    Creatinine, Ser 2.30 (*)    Hemoglobin 11.6 (*)    HCT 34.0 (*)    All other components within normal limits  POC OCCULT BLOOD, ED - Abnormal; Notable for the following components:   Fecal Occult Bld POSITIVE (*)    All other components within normal limits  LIPASE, BLOOD  POC OCCULT BLOOD, ED  TYPE AND SCREEN  ABO/RH    EKG None  Radiology No results found.  Procedures Procedures (including critical care time)  Medications Ordered in ED Medications  sodium chloride 0.9 % bolus 1,000 mL (0 mLs Intravenous Stopped 06/04/18 1402)  famotidine (PEPCID) IVPB 20 mg premix (0 mg Intravenous Stopped 06/04/18 1402)     Initial Impression / Assessment and Plan / ED Course  I have reviewed the triage vital signs and the nursing notes.  Pertinent labs & imaging results that were available during my care of the patient were reviewed by me and considered in my medical decision making (see chart for details).     Patient with elevated BUN.  Most likely patient has upper GI bleed on Eliquis.  She is hemodynamically stable.  She will be admitted to medicine and [facility]  will consult.  Final Clinical Impressions(s) / ED Diagnoses   Final diagnoses:  Upper GI bleed    ED Discharge Orders    None       Milton Ferguson, MD 06/04/18 1426

## 2018-06-04 NOTE — H&P (Signed)
History and Physical    CHONTEL WARNING FBP:[id number] DOB: 1946-06-08 DOA: 06/04/2018  PCP: Nolene Ebbs, MD  Patient coming from: Home   Chief Complaint: Dark bloody BM x 1 week   HPI: Kathleen [name] is a 72 y.o. female with medical history significant of chronic atrial fibrillation on Eliquis, coronary artery disease, essential hypertension, hyperlipidemia, since with one-week history of dark bloody and loose bowel movements.  She also admits to lower abdominal cramping.  She denies any recent illnesses, no fevers, chills, worsening shortness of breath, new cough, chest pain, nausea or vomiting.  Denies any peripheral edema or rash, skin ulcers.  Last Eliquis dose was last night.  She last had a colonoscopy about a year ago which was unremarkable.  Does not recall ever having EGD.  I do not see colonoscopy report in our computer system or care everywhere.  She denies any ibuprofen use over-the-counter.  ED Course: Labs obtained which revealed hemoglobin 10.4, repeat hemoglobin 11.6.  BUN 90.  Fecal occult blood was positive.  GI was consulted.  Review of Systems: As per HPI otherwise 10 point review of systems negative.   Past Medical History:  Diagnosis Date  . Arthritis   . Blood transfusion    no side affects  . CHF (congestive heart failure) ([place])   . Coronary artery disease    Cypher stent to the RCA in 2003  . Diabetes mellitus    Borderline  . GERD (gastroesophageal reflux disease)   . Hypertension   . Shortness of breath     Past Surgical History:  Procedure Laterality Date  . CARDIOVERSION N/A 03/17/2015   Procedure: CARDIOVERSION;  Surgeon: Sueanne Margarita, MD;  Location: [facility];  Service: Cardiovascular;  Laterality: N/A;  . CHOLECYSTECTOMY    . CORONARY ANGIOPLASTY WITH STENT PLACEMENT    . TOTAL KNEE ARTHROPLASTY       reports that she quit smoking about 4 years ago. Her smoking use included cigarettes. She has a 9.00 pack-year smoking history. She has  never used smokeless tobacco. She reports that she does not drink alcohol or use drugs.  Allergies  Allergen Reactions  . Penicillins Other (See Comments)    Has patient had a PCN reaction causing immediate rash, facial/tongue/throat swelling, SOB or lightheadedness with hypotension: Y Has patient had a PCN reaction causing severe rash involving mucus membranes or skin necrosis: Y Has patient had a PCN reaction that required hospitalization: Y Has patient had a PCN reaction occurring within the last 10 years: N If all of the above answers are "NO", then may proceed with Cephalosporin use.     Family History  Problem Relation Age of Onset  . Hypertension Father   . Heart disease Father   . Gout Father   . Arthritis Father   . Heart attack Father   . Heart attack Brother   . Hypertension Brother   . Hypertension Sister   . Stroke Neg Hx     Prior to Admission medications   Medication Sig Start Date End Date Taking? Authorizing Provider  allopurinol (ZYLOPRIM) 100 MG tablet Take 100 mg by mouth daily.   Yes [provider]  COLCRYS 0.6 MG tablet Take 0.6 mg by mouth daily as needed (gout).  02/04/18  Yes [provider]  ELIQUIS 5 MG TABS tablet take 1 tablet by mouth twice a day 07/13/16  Yes Minus Breeding, MD  famotidine (PEPCID) 20 MG tablet Take 20 mg by mouth at bedtime.  02/04/18  Yes [provider]  furosemide (LASIX) 80 MG tablet Take 80 mg by mouth 2 (two) times daily.   Yes [provider]  guaiFENesin (MUCINEX) 600 MG 12 hr tablet Take 600 mg by mouth 2 (two) times daily as needed (congestion).   Yes [provider]  isosorbide-hydrALAZINE (BIDIL) 20-37.5 MG per tablet Take 1 tablet by mouth 3 (three) times daily. 03/10/15  Yes Hochrein, Jeneen Rinks, MD  LANTUS SOLOSTAR 100 UNIT/ML Solostar Pen INJ 10 UNITS [place] QAM 03/17/18  Yes [provider]  metoprolol tartrate (LOPRESSOR) 25 MG tablet TAKE 3 TABLETS BY MOUTH TWICE A  DAY Patient taking differently: TAKE 1 TABLET BY MOUTH TWICE A DAY 02/06/18  Yes Minus Breeding, MD  metoprolol tartrate (LOPRESSOR) 50 MG tablet TK 1 T PO BID 03/17/18  Yes [provider]  pravastatin (PRAVACHOL) 80 MG tablet TK 1 T PO D 03/17/18  Yes [provider]  SYMBICORT 80-4.5 MCG/ACT inhaler Inhale 2 puffs into the lungs 2 (two) times daily. 02/02/18  Yes [provider]  albuterol (PROVENTIL HFA;VENTOLIN HFA) 108 (90 Base) MCG/ACT inhaler Inhale 1-2 puffs into the lungs every 4 (four) hours as needed for wheezing or shortness of breath. 12/06/17   Mariel Aloe, MD  albuterol (PROVENTIL) (2.5 MG/3ML) 0.083% nebulizer solution Take 2.5 mg by nebulization every 6 (six) hours as needed for wheezing or shortness of breath.    [provider]  cyclobenzaprine (FLEXERIL) 10 MG tablet Take 10 mg by mouth 3 (three) times daily as needed for muscle spasms.    [provider]  docusate sodium (COLACE) 100 MG capsule Take 100 mg by mouth 2 (two) times daily as needed for mild constipation.    [provider]  pravastatin (PRAVACHOL) 80 MG tablet Take 1 tablet (80 mg total) by mouth daily. 11/23/11 03/08/18  Minus Breeding, MD    Physical Exam: Vitals:   06/04/18 0921 06/04/18 1056  BP: 120/84 (!) 150/68  Pulse: 76 64  Resp: 17 (!) 27  Temp: 97.9 F (36.6 C)   TempSrc: Oral   SpO2: 100% 100%    Constitutional: NAD, calm, comfortable Eyes: PERRL, lids and conjunctivae normal ENMT: Mucous membranes are moist. Posterior pharynx clear of any exudate or lesions.Normal dentition.  Neck: normal, supple, no masses, no thyromegaly Respiratory: clear to auscultation bilaterally, no wheezing, no crackles. Normal respiratory effort. No accessory muscle use.  Cardiovascular: Irregular rhythm, rate controlled, no murmurs / rubs / gallops. No extremity edema.  Abdomen: Mild tenderness to palpation lower quadrants, abdomen soft, no masses palpated. No  hepatosplenomegaly. Bowel sounds positive.  Musculoskeletal: no clubbing / cyanosis. No joint deformity upper and lower extremities. Good ROM, no contractures. Normal muscle tone.  Skin: no rashes, lesions, ulcers. No induration Neurologic: CN 2-12 grossly intact. Strength 5/5 in all 4.  Psychiatric: Normal judgment and insight. Alert and oriented x 3. Normal mood.   Labs on Admission: I have personally reviewed following labs and imaging studies  CBC: Recent Labs  Lab 06/04/18 1106 06/04/18 1114  WBC 7.6  --   NEUTROABS 4.7  --   HGB 10.4* 11.6*  HCT 33.1* 34.0*  MCV 86.4  --   PLT 230  --    Basic Metabolic Panel: Recent Labs  Lab 06/04/18 1106 06/04/18 1114  NA 143 138  K 3.4* 3.8  CL 101 97*  CO2 30  --   GLUCOSE 100* 94  BUN 90* 92*  CREATININE 2.40* 2.30*  CALCIUM  10.1  --    GFR: CrCl cannot be calculated (Unknown ideal weight.). Liver Function Tests: Recent Labs  Lab 06/04/18 1106  AST 90*  ALT 89*  ALKPHOS 116  BILITOT 1.3*  PROT 8.0  ALBUMIN 3.7   Recent Labs  Lab 06/04/18 1106  LIPASE 29   No results for input(s): AMMONIA in the last 168 hours. Coagulation Profile: No results for input(s): INR, PROTIME in the last 168 hours. Cardiac Enzymes: No results for input(s): CKTOTAL, CKMB, CKMBINDEX, TROPONINI in the last 168 hours. BNP (last 3 results) No results for input(s): PROBNP in the last 8760 hours. HbA1C: No results for input(s): HGBA1C in the last 72 hours. CBG: No results for input(s): GLUCAP in the last 168 hours. Lipid Profile: No results for input(s): CHOL, HDL, LDLCALC, TRIG, CHOLHDL, LDLDIRECT in the last 72 hours. Thyroid Function Tests: No results for input(s): TSH, T4TOTAL, FREET4, T3FREE, THYROIDAB in the last 72 hours. Anemia Panel: No results for input(s): VITAMINB12, FOLATE, FERRITIN, TIBC, IRON, RETICCTPCT in the last 72 hours. Urine analysis:    Component Value Date/Time   COLORURINE YELLOW 06/04/2018 0923    APPEARANCEUR CLEAR 06/04/2018 0923   LABSPEC 1.011 06/04/2018 0923   PHURINE 5.0 06/04/2018 0923   GLUCOSEU NEGATIVE 06/04/2018 0923   HGBUR SMALL (A) 06/04/2018 0923   BILIRUBINUR NEGATIVE 06/04/2018 0923   KETONESUR NEGATIVE 06/04/2018 0923   PROTEINUR NEGATIVE 06/04/2018 0923   UROBILINOGEN 1.0 08/15/2015 0915   NITRITE NEGATIVE 06/04/2018 0923   LEUKOCYTESUR NEGATIVE 06/04/2018 0923   Sepsis Labs: !!!!!!!!!!!!!!!!!!!!!!!!!!!!!!!!!!!!!!!!!!!! @LABRCNTIP (procalcitonin:4,lacticidven:4) )No results found for this or any previous visit (from the past 240 hour(s)).   Radiological Exams on Admission: No results found.  Assessment/Plan Principal Problem:   Melena Active Problems:   Essential hypertension   Coronary atherosclerosis   Diabetes mellitus (HCC)   HLD (hyperlipidemia)   CKD (chronic kidney disease) stage 4, GFR 15-29 ml/min (HCC)   Atrial fibrillation, chronic (HCC)   Melena -Reports 1 week history of melanotic stools -FOBT positive -Hold eliquis -Trend H&H. Hgb 10.4 --> 11.6 so far -GI consulted -IV PPI  CKD stage 4  -Baseline creatinine 1.9-2.1 -Stable, continue to monitor  Chronic atrial fibrillation -Hold Eliquis -Rate controlled currently  Coronary artery disease -Hold BiDil while n.p.o.  Essential hypertension -Hold Lopressor for now until GI bleed stable  Hyperlipidemia -Hold Pravachol while n.p.o.  Diabetes mellitus -Sliding scale insulin    DVT prophylaxis: SCD Code Status: Full  Family Communication: At bedside Disposition Plan: Pending GI evaluation Consults called: [facility]  Admission status: Inpatient   * I certify that at the point of admission it is my clinical judgment that the patient will require inpatient hospital care spanning beyond 2 midnights from the point of admission due to high intensity of service, high risk for further deterioration and high frequency of surveillance required.*   [name], DO [facility] [email or web address] Password [facility] 06/04/2018, 3:02 PM

## 2018-06-04 NOTE — ED Triage Notes (Signed)
Patient c/o generalized abdominal pain with dark stools x1 week. Denies N/V/D. Patient also c/o right shoulder pain and difficulty gripping right hand x5 days. Denies headaches, blurred vision. Reports decreased appetite.

## 2018-06-04 NOTE — ED Notes (Signed)
ED TO INPATIENT HANDOFF REPORT  Name/Age/Gender Kathleen [name] 72 y.o. female  Code Status Code Status History    Date Active Date Inactive Code Status Order ID Comments User Context   12/04/2017 0752 12/06/2017 1738 Full Code [id number]  [name], NP ED   01/23/2015 0608 01/28/2015 1636 Full Code [id number]  [name], MD ED   09/17/2014 1036 09/19/2014 1847 Full Code [id number]  [name], MD Inpatient   11/02/2011 1136 11/04/2011 1452 Full Code [id number]  [name], RN Inpatient      Home/SNF/Other Home  Chief Complaint right side pain   Level of Care/Admitting Diagnosis ED Disposition    ED Disposition Condition [facility] Area: [facility] [[id number]]  Level of Care: Telemetry [5]  Admit to tele based on following criteria: Complex arrhythmia (Bradycardia/Tachycardia)  Diagnosis: Melena [[id number]]  Admitting Physician: [name] [[id number]]  Attending Physician: [name] [phone]  Estimated length of stay: past midnight tomorrow  Certification:: I certify this patient will need inpatient services for at least 2 midnights  PT Class (Do Not Modify): Inpatient [101]  PT Acc Code (Do Not Modify): Private [1]       Medical History Past Medical History:  Diagnosis Date  . Arthritis   . Blood transfusion    no side affects  . CHF (congestive heart failure) ([place])   . Coronary artery disease    Cypher stent to the RCA in 2003  . Diabetes mellitus    Borderline  . GERD (gastroesophageal reflux disease)   . Hypertension   . Shortness of breath     Allergies Allergies  Allergen Reactions  . Penicillins Other (See Comments)    Has patient had a PCN reaction causing immediate rash, facial/tongue/throat swelling, SOB or lightheadedness with hypotension: Y Has patient had a PCN reaction causing severe rash involving mucus membranes or skin necrosis: Y Has patient had a PCN reaction that required  hospitalization: Y Has patient had a PCN reaction occurring within the last 10 years: N If all of the above answers are "NO", then may proceed with Cephalosporin use.     IV Location/Drains/Wounds Patient Lines/Drains/Airways Status   Active Line/Drains/Airways    Name:   Placement date:   Placement time:   Site:   Days:   Peripheral IV 06/04/18 Left Antecubital   06/04/18    1103    Antecubital   less than 1   External Urinary Catheter   12/04/17    0930    -   182          Labs/Imaging Results for orders placed or performed during the hospital encounter of 06/04/18 (from the past 48 hour(s))  Urinalysis, Routine w reflex microscopic     Status: Abnormal   Collection Time: 06/04/18  9:23 AM  Result Value Ref Range   Color, Urine YELLOW YELLOW   APPearance CLEAR CLEAR   Specific Gravity, Urine 1.011 1.005 - 1.030   pH 5.0 5.0 - 8.0   Glucose, UA NEGATIVE NEGATIVE mg/dL   Hgb urine dipstick SMALL (A) NEGATIVE   Bilirubin Urine NEGATIVE NEGATIVE   Ketones, ur NEGATIVE NEGATIVE mg/dL   Protein, ur NEGATIVE NEGATIVE mg/dL   Nitrite NEGATIVE NEGATIVE   Leukocytes, UA NEGATIVE NEGATIVE   RBC / HPF 0-5 0 - 5 RBC/hpf   WBC, UA 0-5 0 - 5 WBC/hpf   Bacteria, UA RARE (A) NONE SEEN   Squamous Epithelial / LPF 0-5  0 - 5    Comment: Performed at [facility], [place] [address] [place], [place] [address]  Lipase, blood     Status: None   Collection Time: 06/04/18 11:06 AM  Result Value Ref Range   Lipase 29 11 - 51 U/L    Comment: Performed at [facility], [place] [address], [place], [place] [address]  Comprehensive metabolic panel     Status: Abnormal   Collection Time: 06/04/18 11:06 AM  Result Value Ref Range   Sodium 143 135 - 145 mmol/L   Potassium 3.4 (L) 3.5 - 5.1 mmol/L   Chloride 101 101 - 111 mmol/L   CO2 30 22 - 32 mmol/L   Glucose, Bld 100 (H) 65 - 99 mg/dL   BUN 90 (H) 6 - 20 mg/dL   Creatinine, Ser 2.40 (H) 0.44 - 1.00 mg/dL    Calcium 10.1 8.9 - 10.3 mg/dL   Total Protein 8.0 6.5 - 8.1 g/dL   Albumin 3.7 3.5 - 5.0 g/dL   AST 90 (H) 15 - 41 U/L   ALT 89 (H) 14 - 54 U/L   Alkaline Phosphatase 116 38 - 126 U/L   Total Bilirubin 1.3 (H) 0.3 - 1.2 mg/dL   GFR calc non Af Amer 19 (L) >60 mL/min   GFR calc Af Amer 22 (L) >60 mL/min    Comment: (NOTE) The eGFR has been calculated using the CKD EPI equation. This calculation has not been validated in all clinical situations. eGFR's persistently <60 mL/min signify possible Chronic Kidney Disease.    Anion gap 12 5 - 15    Comment: Performed at [facility], [place] [address] [place], [place] [address]  CBC     Status: Abnormal   Collection Time: 06/04/18 11:06 AM  Result Value Ref Range   WBC 7.6 4.0 - 10.5 K/uL   RBC 3.83 (L) 3.87 - 5.11 MIL/uL   Hemoglobin 10.4 (L) 12.0 - 15.0 g/dL   HCT 33.1 (L) 36.0 - 46.0 %   MCV 86.4 78.0 - 100.0 fL   MCH 27.2 26.0 - 34.0 pg   MCHC 31.4 30.0 - 36.0 g/dL   RDW 18.5 (H) 11.5 - 15.5 %   Platelets 230 150 - 400 K/uL    Comment: REPEATED TO VERIFY SPECIMEN CHECKED FOR CLOTS Performed at [facility], [place] [address] [place], [place] [address]   Type and screen     Status: None   Collection Time: 06/04/18 11:06 AM  Result Value Ref Range   ABO/RH(D) O POS    Antibody Screen NEG    Sample Expiration      06/07/2018 Performed at [facility], [place] [address], [place], [place] [address]   Differential     Status: Abnormal   Collection Time: 06/04/18 11:06 AM  Result Value Ref Range   Neutrophils Relative % 62 %   Neutro Abs 4.7 1.7 - 7.7 K/uL   Lymphocytes Relative 23 %   Lymphs Abs 1.8 0.7 - 4.0 K/uL   Monocytes Relative 14 %   Monocytes Absolute 1.1 (H) 0.1 - 1.0 K/uL   Eosinophils Relative 1 %   Eosinophils Absolute 0.1 0.0 - 0.7 K/uL   Basophils Relative 0 %   Basophils Absolute 0.0 0.0 - 0.1 K/uL    Comment: Performed at [facility],  [place] [address], [place], [place] [address]  POC occult blood, ED     Status: Abnormal  Collection Time: 06/04/18 11:10 AM  Result Value Ref Range   Fecal Occult Bld POSITIVE (A) NEGATIVE  I-stat chem 8, ed     Status: Abnormal   Collection Time: 06/04/18 11:14 AM  Result Value Ref Range   Sodium 138 135 - 145 mmol/L   Potassium 3.8 3.5 - 5.1 mmol/L   Chloride 97 (L) 101 - 111 mmol/L   BUN 92 (H) 6 - 20 mg/dL   Creatinine, Ser 2.30 (H) 0.44 - 1.00 mg/dL   Glucose, Bld 94 65 - 99 mg/dL   Calcium, Ion 1.17 1.15 - 1.40 mmol/L   TCO2 29 22 - 32 mmol/L   Hemoglobin 11.6 (L) 12.0 - 15.0 g/dL   HCT 34.0 (L) 36.0 - 46.0 %   No results found.  Pending Labs Unresulted Labs (From admission, onward)   Start     Ordered   06/04/18 1105  ABO/Rh  Once,   R     06/04/18 1105   Signed and Held  CBC  Tomorrow morning,   R     Signed and Held   Signed and Held  Basic metabolic panel  Tomorrow morning,   R     Signed and Held   Signed and Held  Hemoglobin and hematocrit, blood  Now then every 6 hours,   R     Signed and Held      Vitals/Pain Today's Vitals   06/04/18 0921 06/04/18 1014 06/04/18 1056 06/04/18 1532  BP: 120/84  (!) 150/68 124/77  Pulse: 76  64 76  Resp: 17  (!) 27 18  Temp: 97.9 F (36.6 C)     TempSrc: Oral     SpO2: 100%  100% 100%  PainSc: 6  6       Isolation Precautions No active isolations  Medications Medications  sodium chloride 0.9 % bolus 1,000 mL (0 mLs Intravenous Stopped 06/04/18 1402)  famotidine (PEPCID) IVPB 20 mg premix (0 mg Intravenous Stopped 06/04/18 1402)    Mobility walks with device

## 2018-06-05 ENCOUNTER — Inpatient Hospital Stay (HOSPITAL_COMMUNITY): Payer: Medicare Other

## 2018-06-05 DIAGNOSIS — K31811 Angiodysplasia of stomach and duodenum with bleeding: Secondary | ICD-10-CM | POA: Diagnosis not present

## 2018-06-05 DIAGNOSIS — K921 Melena: Secondary | ICD-10-CM | POA: Diagnosis not present

## 2018-06-05 LAB — BASIC METABOLIC PANEL
ANION GAP: 9 (ref 5–15)
BUN: 74 mg/dL — AB (ref 6–20)
CHLORIDE: 108 mmol/L (ref 101–111)
CO2: 28 mmol/L (ref 22–32)
Calcium: 9.4 mg/dL (ref 8.9–10.3)
Creatinine, Ser: 2.04 mg/dL — ABNORMAL HIGH (ref 0.44–1.00)
GFR calc Af Amer: 27 mL/min — ABNORMAL LOW (ref >60)
GFR, EST NON AFRICAN AMERICAN: 23 mL/min — AB (ref >60)
Glucose, Bld: 60 mg/dL — ABNORMAL LOW (ref 65–99)
POTASSIUM: 3.4 mmol/L — AB (ref 3.5–5.1)
Sodium: 145 mmol/L (ref 135–145)

## 2018-06-05 LAB — GLUCOSE, CAPILLARY
GLUCOSE-CAPILLARY: 60 mg/dL — AB (ref 65–99)
GLUCOSE-CAPILLARY: 144 mg/dL — AB (ref 65–99)
Glucose-Capillary: 121 mg/dL — ABNORMAL HIGH (ref 65–99)
Glucose-Capillary: 128 mg/dL — ABNORMAL HIGH (ref 65–99)
Glucose-Capillary: 72 mg/dL (ref 65–99)
Glucose-Capillary: 74 mg/dL (ref 65–99)
Glucose-Capillary: 99 mg/dL (ref 65–99)

## 2018-06-05 LAB — CBC
HCT: 29.5 % — ABNORMAL LOW (ref 36.0–46.0)
HEMOGLOBIN: 9.1 g/dL — AB (ref 12.0–15.0)
MCH: 26.8 pg (ref 26.0–34.0)
MCHC: 30.8 g/dL (ref 30.0–36.0)
MCV: 86.8 fL (ref 78.0–100.0)
Platelets: 197 10*3/uL (ref 150–400)
RBC: 3.4 MIL/uL — AB (ref 3.87–5.11)
RDW: 18.6 % — ABNORMAL HIGH (ref 11.5–15.5)
WBC: 6.4 10*3/uL (ref 4.0–10.5)

## 2018-06-05 LAB — PROTIME-INR
INR: 1.26
Prothrombin Time: 15.7 seconds — ABNORMAL HIGH (ref 11.4–15.2)

## 2018-06-05 MED ORDER — CYCLOBENZAPRINE HCL 10 MG PO TABS: 10.0000 mg | ORAL_TABLET | Freq: Three times a day (TID) | ORAL | Status: DC | PRN

## 2018-06-05 MED ORDER — ALLOPURINOL 100 MG PO TABS
100.0000 mg | ORAL_TABLET | Freq: Every day | ORAL | Status: DC
  Administered 2018-06-05 – 2018-06-07 (×3): 100 mg via ORAL
  Filled 2018-06-05 (×3): qty 1

## 2018-06-05 MED ORDER — DEXTROSE 50 % IV SOLN
25.0000 g | Freq: Once | INTRAVENOUS | Status: AC
  Administered 2018-06-05: 25 g via INTRAVENOUS
  Filled 2018-06-05: qty 50

## 2018-06-05 MED ORDER — METOPROLOL TARTRATE 25 MG PO TABS
25.0000 mg | ORAL_TABLET | Freq: Two times a day (BID) | ORAL | Status: DC
  Administered 2018-06-05 – 2018-06-07 (×5): 25 mg via ORAL
  Filled 2018-06-05 (×5): qty 1

## 2018-06-05 MED ORDER — ALBUTEROL SULFATE (2.5 MG/3ML) 0.083% IN NEBU
2.5000 mg | INHALATION_SOLUTION | RESPIRATORY_TRACT | Status: DC | PRN
Start: 2018-06-05 — End: 2018-06-07

## 2018-06-05 MED ORDER — MOMETASONE FURO-FORMOTEROL FUM 100-5 MCG/ACT IN AERO
2.0000 | INHALATION_SPRAY | Freq: Two times a day (BID) | RESPIRATORY_TRACT | Status: DC
  Administered 2018-06-05 – 2018-06-07 (×4): 2 via RESPIRATORY_TRACT
  Filled 2018-06-05: qty 8.8

## 2018-06-05 MED ORDER — SODIUM CHLORIDE 0.9 % IV SOLN
INTRAVENOUS | Status: DC
  Administered 2018-06-05 – 2018-06-06 (×3): via INTRAVENOUS

## 2018-06-05 MED ORDER — PANTOPRAZOLE SODIUM 40 MG IV SOLR
40.0000 mg | Freq: Two times a day (BID) | INTRAVENOUS | Status: DC
  Administered 2018-06-05 – 2018-06-07 (×5): 40 mg via INTRAVENOUS
  Filled 2018-06-05 (×7): qty 40

## 2018-06-05 NOTE — Consult Note (Addendum)
Referring Provider: [place] Primary Care Physician:  Nolene Ebbs, MD Primary Gastroenterologist:  Althia Forts   Reason for Consultation:  Melena   HPI: Kathleen [name] is a 72 y.o. female with past medical history of coronary artery disease, chronic atrial fibrillation on Eliquis, history of CHF presented to the hospital yesterday with complaint of generalized abdominal pain and dark stools. GI is consulted for further evaluation.  She is complaining of dark tarry stool for last 10 days. 3-4 bowel movements per day. Associated with generalized as well as epigastric abdominal discomfort. Denies any nausea vomiting. Denies any dysphagia or odynophagia. Complaining of chronic acid reflux. She is also complaining of around 20 pound weight loss in last 3 months because of decreased appetite.baseline shortness of breath. Denies any active chest pain.  Although, patient is claiming having a normal colonoscopy last year, I do not have any reports available to review. No colonoscopy at [facility]. No colonoscopy at [facility]. No report of colonoscopy procedure from [facility]   Past Medical History:  Diagnosis Date  . Arthritis   . Blood transfusion    no side affects  . CHF (congestive heart failure) ([place])   . Coronary artery disease    Cypher stent to the RCA in 2003  . Diabetes mellitus    Borderline  . GERD (gastroesophageal reflux disease)   . Hypertension   . Shortness of breath     Past Surgical History:  Procedure Laterality Date  . CARDIOVERSION N/A 03/17/2015   Procedure: CARDIOVERSION;  Surgeon: Sueanne Margarita, MD;  Location: [facility] ENDOSCOPY;  Service: Cardiovascular;  Laterality: N/A;  . CHOLECYSTECTOMY    . CORONARY ANGIOPLASTY WITH STENT PLACEMENT    . TOTAL KNEE ARTHROPLASTY      Prior to Admission medications   Medication Sig Start Date End Date Taking? Authorizing Provider  allopurinol (ZYLOPRIM) 100 MG tablet Take 100 mg by mouth daily.   Yes [provider]   COLCRYS 0.6 MG tablet Take 0.6 mg by mouth daily as needed (gout).  02/04/18  Yes [provider]  ELIQUIS 5 MG TABS tablet take 1 tablet by mouth twice a day 07/13/16  Yes Minus Breeding, MD  famotidine (PEPCID) 20 MG tablet Take 20 mg by mouth at bedtime.  02/04/18  Yes [provider]  furosemide (LASIX) 80 MG tablet Take 80 mg by mouth 2 (two) times daily.   Yes [provider]  guaiFENesin (MUCINEX) 600 MG 12 hr tablet Take 600 mg by mouth 2 (two) times daily as needed (congestion).   Yes [provider]  isosorbide-hydrALAZINE (BIDIL) 20-37.5 MG per tablet Take 1 tablet by mouth 3 (three) times daily. 03/10/15  Yes Hochrein, Jeneen Rinks, MD  LANTUS SOLOSTAR 100 UNIT/ML Solostar Pen INJ 10 UNITS [place] QAM 03/17/18  Yes [provider]  metoprolol tartrate (LOPRESSOR) 25 MG tablet TAKE 3 TABLETS BY MOUTH TWICE A DAY Patient taking differently: TAKE 1 TABLET BY MOUTH TWICE A DAY 02/06/18  Yes Minus Breeding, MD  metoprolol tartrate (LOPRESSOR) 50 MG tablet TK 1 T PO BID 03/17/18  Yes [provider]  pravastatin (PRAVACHOL) 80 MG tablet TK 1 T PO D 03/17/18  Yes [provider]  SYMBICORT 80-4.5 MCG/ACT inhaler Inhale 2 puffs into the lungs 2 (two) times daily. 02/02/18  Yes [provider]  albuterol (PROVENTIL HFA;VENTOLIN HFA) 108 (90 Base) MCG/ACT inhaler Inhale 1-2 puffs into the lungs every 4 (four) hours as needed for wheezing or shortness of breath. 12/06/17  Mariel Aloe, MD  albuterol (PROVENTIL) (2.5 MG/3ML) 0.083% nebulizer solution Take 2.5 mg by nebulization every 6 (six) hours as needed for wheezing or shortness of breath.    [provider]  cyclobenzaprine (FLEXERIL) 10 MG tablet Take 10 mg by mouth 3 (three) times daily as needed for muscle spasms.    [provider]  docusate sodium (COLACE) 100 MG capsule Take 100 mg by mouth 2 (two) times daily as needed for mild constipation.    [provider]  pravastatin (PRAVACHOL) 80 MG tablet Take 1 tablet (80 mg total) by mouth daily. 11/23/11 03/08/18  Minus Breeding, MD    Scheduled Meds: . insulin aspart  0-9 Units Subcutaneous Q4H  . [START ON 06/08/2018] pantoprazole  40 mg Intravenous Q12H  . sodium chloride flush  3 mL Intravenous Q12H   Continuous Infusions: . sodium chloride    . pantoprozole (PROTONIX) infusion 8 mg/hr (06/05/18 0254)   PRN Meds:.sodium chloride, acetaminophen **OR** acetaminophen, ondansetron **OR** ondansetron (ZOFRAN) IV, sodium chloride flush  Allergies as of 06/04/2018 - Review Complete 06/04/2018  Allergen Reaction Noted  . Penicillins Other (See Comments) 11/02/2011    Family History  Problem Relation Age of Onset  . Hypertension Father   . Heart disease Father   . Gout Father   . Arthritis Father   . Heart attack Father   . Heart attack Brother   . Hypertension Brother   . Hypertension Sister   . Stroke Neg Hx     Social History   Socioeconomic History  . Marital status: Divorced    Spouse name: Not on file  . Number of children: Not on file  . Years of education: Not on file  . Highest education level: Not on file  Occupational History  . Not on file  Social Needs  . Financial resource strain: Not on file  . Food insecurity:    Worry: Not on file    Inability: Not on file  . Transportation needs:    Medical: Not on file    Non-medical: Not on file  Tobacco Use  . Smoking status: Former Smoker    Packs/day: 0.30    Years: 30.00    Pack years: 9.00    Types: Cigarettes    Last attempt to quit: 01/18/2014    Years since quitting: 4.3  . Smokeless tobacco: Never Used  . Tobacco comment: Smoking cessation requested   Substance and Sexual Activity  . Alcohol use: No  . Drug use: No  . Sexual activity: Not Currently    Birth control/protection: Post-menopausal  Lifestyle  . Physical activity:    Days per week: Not on file    Minutes per session: Not on file  . Stress:  Not on file  Relationships  . Social connections:    Talks on phone: Not on file    Gets together: Not on file    Attends religious service: Not on file    Active member of club or organization: Not on file    Attends meetings of clubs or organizations: Not on file    Relationship status: Not on file  . Intimate partner violence:    Fear of current or ex partner: Not on file    Emotionally abused: Not on file    Physically abused: Not on file    Forced sexual activity: Not on file  Other Topics Concern  . Not on file  Social History Narrative  . Not on file  Review of Systems: Review of Systems  Constitutional: Positive for malaise/fatigue and weight loss. Negative for chills and fever.  HENT: Negative for hearing loss and tinnitus.   Eyes: Negative for blurred vision and double vision.  Respiratory: Positive for shortness of breath. Negative for cough and hemoptysis.   Cardiovascular: Negative for chest pain and palpitations.  Gastrointestinal: Positive for abdominal pain, heartburn and melena. Negative for nausea and vomiting.  Genitourinary: Negative for dysuria and urgency.  Musculoskeletal: Positive for myalgias. Negative for joint pain.  Skin: Negative for itching and rash.  Neurological: Negative for seizures and loss of consciousness.  Endo/Heme/Allergies: Does not bruise/bleed easily.  Psychiatric/Behavioral: Negative for hallucinations and suicidal ideas.    Physical Exam: Vital signs: Vitals:   06/04/18 2012 06/05/18 0408  BP: 129/61 139/69  Pulse: 75 90  Resp: 16 18  Temp: 97.9 F (36.6 C) 99 F (37.2 C)  SpO2: 100% 100%   Last BM Date: 06/03/18 Physical Exam  Constitutional: She is oriented to person, place, and time. She appears well-developed and well-nourished. No distress.  HENT:  Head: Atraumatic.  Mouth/Throat: Oropharyngeal exudate present.  Eyes: EOM are normal. No scleral icterus.  Neck: Normal range of motion. Neck supple. No thyromegaly  present.  Cardiovascular:  IRRR  Pulmonary/Chest: Effort normal. No respiratory distress.  Abdominal: Soft. Bowel sounds are normal. She exhibits no distension. There is no rebound and no guarding.  Epigastric as well as bilateral lower quadrant discomfort on palpation,  Musculoskeletal: Normal range of motion. She exhibits no edema.  Neurological: She is alert and oriented to person, place, and time.  Skin: Skin is dry. No erythema.  Psychiatric: She has a normal mood and affect. Judgment normal.  Vitals reviewed.   GI:  Lab Results: Recent Labs    06/04/18 1106 06/04/18 1114 06/04/18 1659 06/04/18 2243  WBC 7.6  --   --   --   HGB 10.4* 11.6* 9.0* 8.8*  HCT 33.1* 34.0* 28.8* 27.7*  PLT 230  --   --   --    BMET Recent Labs    06/04/18 1106 06/04/18 1114 06/05/18 0501  NA 143 138 145  K 3.4* 3.8 3.4*  CL 101 97* 108  CO2 30  --  28  GLUCOSE 100* 94 60*  BUN 90* 92* 74*  CREATININE 2.40* 2.30* 2.04*  CALCIUM 10.1  --  9.4   LFT Recent Labs    06/04/18 1106  PROT 8.0  ALBUMIN 3.7  AST 90*  ALT 89*  ALKPHOS 116  BILITOT 1.3*   PT/INR No results for input(s): LABPROT, INR in the last 72 hours.   Studies/Results: No results found.  Impression/Plan: - melena since last 10 days.  - acute blood loss anemia - Chronic atrial fibrillation. Last dose of Eliquis 06/22 morning according to patient. - abnormal LFTs. - Chronic kidney disease - weight loss  Recommendations ------------------------ - Tentative plan for EGD tomorrow. - Ultrasound right upper quadrant. Recheck LFTs and INR. Check hepatitis panel. - recheck CBC in the morning. - continue PPI.  Risks (bleeding, infection, bowel perforation that could require surgery, sedation-related changes in cardiopulmonary systems), benefits (identification and possible treatment of source of symptoms, exclusion of certain causes of symptoms), and alternatives (watchful waiting, radiographic imaging studies,  empiric medical treatment)  were explained to patient  in detail and patient wishes to proceed.   LOS: 1 day   Otis Brace  MD, FACP 06/05/2018, 8:04 AM  Contact #  [phone]

## 2018-06-05 NOTE — Progress Notes (Signed)
[facility] Progress Note  Patient: Kathleen [name] MVH:[id number]   PCP: Nolene Ebbs, MD DOB: 07/12/1946   DOA: 06/04/2018   DOS: 06/05/2018   Date of Service: the patient was seen and examined on 06/05/2018  Subjective: Patient did not have any bowel movement today or yesterday.  Prior to that she had 3-4 loose black-colored bowel movement ongoing for last 10 days.  Denies any nausea or vomiting right now.  Diffuse abdominal soreness but no localized pain.  No chest pain or shortness of breath.  Some dizziness on ambulation.  Brief hospital course: Pt. with PMH of CAD, chronic A. fib on Eliquis, chronic diastolic CHF, type II DM, GERD, HTN; admitted on 06/04/2018, presented with complaint of melena, was found to have upper GI bleed with symptomatic anemia. Currently further plan is continue close monitoring and follow-up on EGD.  Assessment and Plan: 1.  Upper GI bleed with melena. Currently denies any acid reflux. Started on IV Protonix infusion. [facility] consulted. On Eliquis at home currently on hold. baseline hemoglobin 12-13, presented with hemoglobin of 10 now 9.1 although remained stable for last 2 level check. No Active bleeding here in the hospital. GI considering EGD tomorrow. Patient received her last dose of Eliquis Saturday, GI wants to wait for 3 days prior to performing any intervention. Currently on full liquid diet, and protonix q12 hours. Daily CBC  2.  Chronic persistent A. fib, currently rate controlled On Lopressor at home. CHADS-VASc Score 5. Eliquis currently on hold due to GI bleed. GFR 27, s Creat 20.4, age 72. Pt is borderline for criteria for lower dose apixaban.  D.w cardiology, recommended to continue current dose.  3.  Essential hypertension. Chronic diastolic CHF. CAD. Continue Lopressor.  Holding Lasix while the patient is n.p.o. in the hospital.  Hold isosorbide and hydralazine due to soft blood pressure. No IV fluids as the patient appears  euvolemic at present.  4.  Type 2 diabetes mellitus. Last month when A1c checked in our system was 6.2. We will recheck tomorrow. Currently holding Lantus due to patient being n.p.o.-full liquid diet. Continue every 4 hours sliding scale for now.  5.  Elevated LFT. Etiology not clear. Ultrasound liver negative. GI working up further.  6.  Hypokalemia. Replacing.  Diet: full liquid diet DVT Prophylaxis: mechanical compression device  Advance goals of care discussion: full cose  Family Communication: no family was present at bedside, at the time of interview.   Disposition:  Discharge to be determined.  Consultants: gastroenterology, cardiology phone consult Procedures: none  Antibiotics: Anti-infectives (From admission, onward)   None       Objective: Physical Exam: Vitals:   06/04/18 2012 06/05/18 0408 06/05/18 1100 06/05/18 1335  BP: 129/61 139/69  125/64  Pulse: 75 90  (!) 108  Resp: 16 18  18   Temp: 97.9 F (36.6 C) 99 F (37.2 C)  98.3 F (36.8 C)  TempSrc: Oral Oral  Oral  SpO2: 100% 100%  100%  Weight:   96.4 kg (212 lb 8.4 oz)     Intake/Output Summary (Last 24 hours) at 06/05/2018 1338 Last data filed at 06/05/2018 0600 Gross per 24 hour  Intake 1654.16 ml  Output 500 ml  Net 1154.16 ml   Filed Weights   06/05/18 1100  Weight: 96.4 kg (212 lb 8.4 oz)   General: Alert, Awake and Oriented to Time, Place and Person. Appear in mild distress, affect appropriate Eyes: PERRL, Conjunctiva normal ENT: Oral Mucosa clear moist Neck:  no JVD, no Abnormal Mass Or lumps Cardiovascular: S1 and S2 Present, no Murmur, Peripheral Pulses Present Respiratory: normal respiratory effort, Bilateral Air entry equal and Decreased, no use of accessory muscle, Clear to Auscultation, no Crackles, no wheezes Abdomen: Bowel Sound present, Soft and mild tenderness, no hernia Skin: no redness, no Rash, no induration Extremities: no Pedal edema, no calf  tenderness Neurologic: Grossly no focal neuro deficit. Bilaterally Equal motor strength  Data Reviewed: CBC: Recent Labs  Lab 06/04/18 1106 06/04/18 1114 06/04/18 1659 06/04/18 2243 06/05/18 0501  WBC 7.6  --   --   --  6.4  NEUTROABS 4.7  --   --   --   --   HGB 10.4* 11.6* 9.0* 8.8* 9.1*  HCT 33.1* 34.0* 28.8* 27.7* 29.5*  MCV 86.4  --   --   --  86.8  PLT 230  --   --   --  001   Basic Metabolic Panel: Recent Labs  Lab 06/04/18 1106 06/04/18 1114 06/05/18 0501  NA 143 138 145  K 3.4* 3.8 3.4*  CL 101 97* 108  CO2 30  --  28  GLUCOSE 100* 94 60*  BUN 90* 92* 74*  CREATININE 2.40* 2.30* 2.04*  CALCIUM 10.1  --  9.4    Liver Function Tests: Recent Labs  Lab 06/04/18 1106  AST 90*  ALT 89*  ALKPHOS 116  BILITOT 1.3*  PROT 8.0  ALBUMIN 3.7   Recent Labs  Lab 06/04/18 1106  LIPASE 29   No results for input(s): AMMONIA in the last 168 hours. Coagulation Profile: Recent Labs  Lab 06/05/18 0907  INR 1.26   Cardiac Enzymes: No results for input(s): CKTOTAL, CKMB, CKMBINDEX, TROPONINI in the last 168 hours. BNP (last 3 results) No results for input(s): PROBNP in the last 8760 hours. CBG: Recent Labs  Lab 06/04/18 2008 06/05/18 0010 06/05/18 0406 06/05/18 0550 06/05/18 1137  GLUCAP 79 72 60* 144* 74   Studies: US Abdomen Limited Ruq  Result Date: 06/05/2018 CLINICAL DATA:  Abnormal LFTs EXAM: ULTRASOUND ABDOMEN LIMITED RIGHT UPPER QUADRANT COMPARISON:  08/15/2015 FINDINGS: Gallbladder: Surgically removed Common bile duct: Diameter: 2.7 mm Liver: No focal lesion identified. Within normal limits in parenchymal echogenicity. Portal vein is patent on color Doppler imaging with normal direction of blood flow towards the liver. IMPRESSION: Status post cholecystectomy. No acute abnormality noted. Electronically Signed   By: Inez Catalina M.D.   On: 06/05/2018 10:18    Scheduled Meds: . allopurinol  100 mg Oral Daily  . insulin aspart  0-9 Units  Subcutaneous Q4H  . metoprolol tartrate  25 mg Oral BID  . mometasone-formoterol  2 puff Inhalation BID  . pantoprazole  40 mg Intravenous Q12H  . sodium chloride flush  3 mL Intravenous Q12H   Continuous Infusions: . sodium chloride     PRN Meds: sodium chloride, acetaminophen **OR** acetaminophen, albuterol, cyclobenzaprine, ondansetron **OR** ondansetron (ZOFRAN) IV, sodium chloride flush  Time spent: 35 minutes  Author: Berle Mull, MD [facility] Pager: [phone] 06/05/2018 1:38 PM  If 7PM-7AM, please contact night-coverage at [email or web address], password [email or web address]

## 2018-06-05 NOTE — Progress Notes (Deleted)
Patient unable to arouse by sternal rub or any other measures. Patient occurred to be sleeping hard. All vital signs stable, CBG checked and was stable. Daughter at [facility] and stated that this is a normal heavy sleep for patient.  MD notified. New orders placed.  Will continue to monitor patient closely.

## 2018-06-05 NOTE — Progress Notes (Signed)
Patient's blood glucose is 60, the doctor on call was notified. Will wait on new orders.

## 2018-06-06 ENCOUNTER — Encounter (HOSPITAL_COMMUNITY): Payer: Self-pay | Admitting: *Deleted

## 2018-06-06 ENCOUNTER — Observation Stay (HOSPITAL_COMMUNITY): Payer: Medicare Other | Admitting: Anesthesiology

## 2018-06-06 ENCOUNTER — Encounter (HOSPITAL_COMMUNITY)
Admission: EM | Disposition: A | Discharge status: Home / Self Care | Payer: Self-pay | Source: Home / Self Care | Attending: Emergency Medicine

## 2018-06-06 DIAGNOSIS — N184 Chronic kidney disease, stage 4 (severe): Secondary | ICD-10-CM | POA: Diagnosis not present

## 2018-06-06 DIAGNOSIS — K31811 Angiodysplasia of stomach and duodenum with bleeding: Secondary | ICD-10-CM | POA: Diagnosis not present

## 2018-06-06 DIAGNOSIS — K921 Melena: Secondary | ICD-10-CM | POA: Diagnosis not present

## 2018-06-06 DIAGNOSIS — I130 Hypertensive heart and chronic kidney disease with heart failure and stage 1 through stage 4 chronic kidney disease, or unspecified chronic kidney disease: Secondary | ICD-10-CM | POA: Diagnosis not present

## 2018-06-06 DIAGNOSIS — E1122 Type 2 diabetes mellitus with diabetic chronic kidney disease: Secondary | ICD-10-CM | POA: Diagnosis not present

## 2018-06-06 HISTORY — PX: HOT HEMOSTASIS: SHX5433

## 2018-06-06 HISTORY — PX: ESOPHAGOGASTRODUODENOSCOPY (EGD) WITH PROPOFOL: SHX5813

## 2018-06-06 HISTORY — PX: BIOPSY: SHX5522

## 2018-06-06 LAB — BASIC METABOLIC PANEL
Anion gap: 9 (ref 5–15)
BUN: 61 mg/dL — AB (ref 8–23)
CO2: 26 mmol/L (ref 22–32)
CREATININE: 1.81 mg/dL — AB (ref 0.44–1.00)
Calcium: 9.4 mg/dL (ref 8.9–10.3)
Chloride: 106 mmol/L (ref 98–111)
GFR calc Af Amer: 31 mL/min — ABNORMAL LOW (ref >60)
GFR, EST NON AFRICAN AMERICAN: 27 mL/min — AB (ref >60)
Glucose, Bld: 92 mg/dL (ref 70–99)
Potassium: 4.1 mmol/L (ref 3.5–5.1)
SODIUM: 141 mmol/L (ref 135–145)

## 2018-06-06 LAB — GLUCOSE, CAPILLARY
GLUCOSE-CAPILLARY: 98 mg/dL (ref 70–99)
GLUCOSE-CAPILLARY: 120 mg/dL — AB (ref 70–99)
GLUCOSE-CAPILLARY: 108 mg/dL — AB (ref 70–99)
GLUCOSE-CAPILLARY: 114 mg/dL — AB (ref 70–99)
GLUCOSE-CAPILLARY: 158 mg/dL — AB (ref 70–99)
Glucose-Capillary: 103 mg/dL — ABNORMAL HIGH (ref 70–99)
Glucose-Capillary: 95 mg/dL (ref 70–99)

## 2018-06-06 LAB — HCV COMMENT:

## 2018-06-06 LAB — PROTIME-INR
INR: 1.22
Prothrombin Time: 15.3 seconds — ABNORMAL HIGH (ref 11.4–15.2)

## 2018-06-06 LAB — CBC
HCT: 30 % — ABNORMAL LOW (ref 36.0–46.0)
Hemoglobin: 9.4 g/dL — ABNORMAL LOW (ref 12.0–15.0)
MCH: 27 pg (ref 26.0–34.0)
MCHC: 31.3 g/dL (ref 30.0–36.0)
MCV: 86.2 fL (ref 78.0–100.0)
PLATELETS: 224 10*3/uL (ref 150–400)
RBC: 3.48 MIL/uL — AB (ref 3.87–5.11)
RDW: 19.1 % — ABNORMAL HIGH (ref 11.5–15.5)
WBC: 7.2 10*3/uL (ref 4.0–10.5)

## 2018-06-06 LAB — MAGNESIUM: Magnesium: 1.9 mg/dL (ref 1.7–2.4)

## 2018-06-06 LAB — HEPATITIS B SURFACE ANTIGEN: HEP B S AG: NEGATIVE

## 2018-06-06 LAB — HEPATITIS C ANTIBODY (REFLEX): HCV Ab: 0.2 s/co ratio (ref 0.0–0.9)

## 2018-06-06 SURGERY — ESOPHAGOGASTRODUODENOSCOPY (EGD) WITH PROPOFOL
Anesthesia: Monitor Anesthesia Care

## 2018-06-06 MED ORDER — PROPOFOL 500 MG/50ML IV EMUL
INTRAVENOUS | Status: DC | PRN
  Administered 2018-06-06: 75 ug/kg/min via INTRAVENOUS

## 2018-06-06 MED ORDER — PROPOFOL 500 MG/50ML IV EMUL
INTRAVENOUS | Status: DC | PRN
  Administered 2018-06-06: 30 mg via INTRAVENOUS
  Administered 2018-06-06: 20 mg via INTRAVENOUS
  Administered 2018-06-06: 30 mg via INTRAVENOUS

## 2018-06-06 MED ORDER — APIXABAN 5 MG PO TABS
5.0000 mg | ORAL_TABLET | Freq: Two times a day (BID) | ORAL | Status: DC
  Administered 2018-06-07 (×2): 5 mg via ORAL
  Filled 2018-06-06 (×2): qty 1

## 2018-06-06 MED ORDER — APIXABAN 5 MG PO TABS: 5.0000 mg | ORAL_TABLET | Freq: Two times a day (BID) | ORAL | Status: DC

## 2018-06-06 MED ORDER — PROPOFOL 10 MG/ML IV BOLUS
INTRAVENOUS | Status: AC
  Filled 2018-06-06: qty 20

## 2018-06-06 SURGICAL SUPPLY — 15 items

## 2018-06-06 NOTE — Brief Op Note (Addendum)
06/04/2018 - 06/06/2018  1:57 PM  PATIENT:  Kathleen [name]  72 y.o. female  PRE-OPERATIVE DIAGNOSIS:  Melena  POST-OPERATIVE DIAGNOSIS:  bleeding AVM used APC   PROCEDURE:  Procedure(s): ESOPHAGOGASTRODUODENOSCOPY (EGD) WITH PROPOFOL (N/A) HOT HEMOSTASIS (ARGON PLASMA COAGULATION/BICAP) (N/A) BIOPSY  SURGEON:  Surgeon(s) and Role:    * Drew Lips, MD - Primary  Findings ------------ - EGD showed active bleeding from AVM in the second part of the duodenum. It was successfully treated with APC.EGD also showed mild gastritis in the antrum. Biopsies taken.  Recommendations ---------------------------- - Advance diet -  recheck CBC in the morning. - okay to resume anticoagulation from GI standpoint. - patient is claiming having a normal colonoscopy last year, I do not have any reports available to review. According to daughter, she may had colonoscopy done and they will check with the primary care physician. They were advised to have a primary care physician arrange for colonoscopy if it was not done recently - GI will sign off. Call us back if needed. - follow-up in GI clinic in 4-6 weeks for further workup of abnormal LFTs.  Otis Brace MD, [place] 06/06/2018, 2:13 PM  Contact #  [phone]

## 2018-06-06 NOTE — Anesthesia Postprocedure Evaluation (Signed)
Anesthesia Post Note  Patient: Kathleen [name]  Procedure(s) Performed: ESOPHAGOGASTRODUODENOSCOPY (EGD) WITH PROPOFOL (N/A ) HOT HEMOSTASIS (ARGON PLASMA COAGULATION/BICAP) (N/A ) BIOPSY     Patient location during evaluation: PACU Anesthesia Type: MAC Level of consciousness: awake and alert Pain management: pain level controlled Vital Signs Assessment: post-procedure vital signs reviewed and stable Respiratory status: spontaneous breathing, nonlabored ventilation, respiratory function stable and patient connected to nasal cannula oxygen Cardiovascular status: stable and blood pressure returned to baseline Postop Assessment: no apparent nausea or vomiting Anesthetic complications: no    Last Vitals:  Vitals:   06/06/18 1300 06/06/18 1358  BP: (!) 118/51 (!) 94/48  Pulse: 92 91  Resp: 20 (!) 27  Temp: 36.7 C 37 C  SpO2: 98% 100%    Last Pain:  Vitals:   06/06/18 1358  TempSrc: Oral  PainSc: 0-No pain                 Cheyene Hamric S

## 2018-06-06 NOTE — Transfer of Care (Signed)
Immediate Anesthesia Transfer of Care Note  Patient: Kathleen [name]  Procedure(s) Performed: Procedure(s): ESOPHAGOGASTRODUODENOSCOPY (EGD) WITH PROPOFOL (N/A) HOT HEMOSTASIS (ARGON PLASMA COAGULATION/BICAP) (N/A) BIOPSY  Patient Location: PACU  Anesthesia Type:MAC  Level of Consciousness:  sedated, patient cooperative and responds to stimulation  Airway & Oxygen Therapy:Patient Spontanous Breathing and Patient connected to face mask oxgen  Post-op Assessment:  Report given to PACU RN and Post -op Vital signs reviewed and stable  Post vital signs:  Reviewed and stable  Last Vitals:  Vitals:   06/06/18 1139 06/06/18 1300  BP:  (!) 118/51  Pulse: (!) 102 92  Resp: 18 20  Temp:  36.7 C  SpO2: 55% 73%    Complications: No apparent anesthesia complications

## 2018-06-06 NOTE — Op Note (Signed)
[facility] Patient Name: Kathleen [name] Procedure Date: 06/06/2018 MRN: [id number] Attending MD: Otis Brace , MD Date of Birth: 1946/11/11 CSN: [id number] Age: 72 Admit Type: Inpatient Procedure:                Upper GI endoscopy Indications:              Melena Providers:                Otis Brace, MD, Angus Seller, Charolette Child, Technician, Arnoldo Hooker, CRNA Referring MD:              Medicines:                Sedation Administered by an Anesthesia Professional Complications:            No immediate complications. Estimated Blood Loss:     Estimated blood loss was minimal. Procedure:                Pre-Anesthesia Assessment:                           - Prior to the procedure, a History and Physical                            was performed, and patient medications and                            allergies were reviewed. The patient's tolerance of                            previous anesthesia was also reviewed. The risks                            and benefits of the procedure and the sedation                            options and risks were discussed with the patient.                            All questions were answered, and informed consent                            was obtained. Prior Anticoagulants: The patient has                            taken Eliquis (apixaban), last dose was 3 days                            prior to procedure. ASA Grade Assessment: III - A                            patient with severe systemic disease. After  reviewing the risks and benefits, the patient was                            deemed in satisfactory condition to undergo the                            procedure.                           After obtaining informed consent, the endoscope was                            passed under direct vision. Throughout the                            procedure, the patient's  blood pressure, pulse, and                            oxygen saturations were monitored continuously. The                            EG-[id number] ([id number]) scope was introduced through the                            mouth, and advanced to the second part of duodenum.                            The upper GI endoscopy was accomplished without                            difficulty. The patient tolerated the procedure                            well. Scope In: Scope Out: Findings:      A non-obstructing Schatzki ring was found at the gastroesophageal       junction.      The Z-line was regular and was found 40 cm from the incisors.      The exam of the esophagus was otherwise normal.      Scattered mild inflammation was found in the gastric antrum. Biopsies       were taken with a cold forceps for Helicobacter pylori testing.      The cardia and gastric fundus were normal on retroflexion.      A single small angioectasia with bleeding on contact was found in the       second portion of the duodenum. Fulguration to stop the bleeding by       argon plasma at 2 liters/minute and 15 watts was successful.      Scattered mild inflammation was found in the duodenal bulb. Impression:               - Non-obstructing Schatzki ring.                           - Z-line regular, 40 cm from the incisors.                           -  Chronic gastritis. Biopsied.                           - A single angioectasia in the duodenum. Treated                            with argon plasma coagulation (APC).                           - Duodenitis. Moderate Sedation:      Moderate (conscious) sedation was personally administered by an       anesthesia professional. The following parameters were monitored: oxygen       saturation, heart rate, blood pressure, and response to care. Recommendation:           - Return patient to hospital ward for ongoing care.                           - Resume previous diet.                            - Continue present medications.                           - Await pathology results. Procedure Code(s):        --- Professional ---                           16109, 8, Esophagogastroduodenoscopy, flexible,                            transoral; with control of bleeding, any method                           43239, Esophagogastroduodenoscopy, flexible,                            transoral; with biopsy, single or multiple Diagnosis Code(s):        --- Professional ---                           K22.2, Esophageal obstruction                           K29.50, Unspecified chronic gastritis without                            bleeding                           K31.819, Angiodysplasia of stomach and duodenum                            without bleeding                           K29.80, Duodenitis without bleeding  K92.1, Melena (includes Hematochezia) CPT copyright 2017 American Medical Association. All rights reserved. The codes documented in this report are preliminary and upon coder review may  be revised to meet current compliance requirements. Otis Brace, MD Otis Brace, MD 06/06/2018 1:57:28 PM Number of Addenda: 0

## 2018-06-06 NOTE — Progress Notes (Signed)
[facility] Progress Note  Patient: Kathleen [name] YSA:[id number]   PCP: Nolene Ebbs, MD DOB: 1946/11/13   DOA: 06/04/2018   DOS: 06/06/2018   Date of Service: the patient was seen and examined on 06/06/2018  Subjective: No further bleeding.  No nausea no vomiting no abdominal pain.  Breathing is okay at rest, goes out of breath on exertion.  Also tachycardic on exertion.  Brief hospital course: Pt. with PMH of CAD, chronic A. fib on Eliquis, chronic diastolic CHF, type II DM, GERD, HTN; admitted on 06/04/2018, presented with complaint of melena, was found to have upper GI bleed with symptomatic anemia. Currently further plan is continue close monitoring after resumption of anti-coagulation  Assessment and Plan: 1.  Upper GI bleed with melena. Currently denies any acid reflux. Started on IV Protonix infusion. [facility] consulted. On Eliquis at home initially on hold. Baseline hemoglobin 12-13, presented with hemoglobin of 10 now 9. although remained stable for last a few level checks. No Active bleeding here in the hospital. Underwent EGD which showed a bleeding AVM treated with APC. GI recommended to resume anticoagulation and monitored on the hospital with a repeat CBC tomorrow morning. Daily CBC  2.  Chronic persistent A. fib, currently rate controlled On Lopressor at home. CHADS-VASc Score 5. Eliquis currently on hold due to GI bleed. GFR 27, s Creat 20.4, age 72. Pt is borderline for criteria for lower dose apixaban.  D.w cardiology, recommended to continue current dose.  3.  Essential hypertension. Chronic diastolic CHF. CAD. Continue Lopressor.  Holding Lasix while the patient is n.p.o. in the hospital.  Hold isosorbide and hydralazine due to soft blood pressure. No IV fluids as the patient appears euvolemic at present.  4.  Type 2 diabetes mellitus. Last month when A1c checked in our system was 6.2. We will recheck tomorrow. Currently holding Lantus due to  patient being n.p.o.-full liquid diet. Continue every 4 hours sliding scale for now.  5.  Elevated LFT. Etiology not clear. Ultrasound liver negative. Serological work-up currently pending.  6.  Hypokalemia. Replacing.  Diet: full liquid diet DVT Prophylaxis: mechanical compression device  Advance goals of care discussion: full cose  Family Communication: no family was present at bedside, at the time of interview.   Disposition:  Discharge to be determined.  Consultants: gastroenterology, cardiology phone consult Procedures: none  Antibiotics: Anti-infectives (From admission, onward)   None       Objective: Physical Exam: Vitals:   06/06/18 1300 06/06/18 1358 06/06/18 1400 06/06/18 1410  BP: (!) 118/51 (!) 94/48 126/62 124/70  Pulse: 92 91 (!) 102 (!) 107  Resp: 20 (!) 27 15 (!) 27  Temp: 98.1 F (36.7 C) 98.6 F (37 C)    TempSrc: Oral Oral    SpO2: 98% 100% 100% 100%  Weight: 96.4 kg (212 lb 8.4 oz)     Height: 5\' 4"  (1.626 m)       Intake/Output Summary (Last 24 hours) at 06/06/2018 1457 Last data filed at 06/06/2018 1100 Gross per 24 hour  Intake 393.33 ml  Output 850 ml  Net -456.67 ml   Filed Weights   06/05/18 1100 06/06/18 1300  Weight: 96.4 kg (212 lb 8.4 oz) 96.4 kg (212 lb 8.4 oz)   General: Alert, Awake and Oriented to Time, Place and Person. Appear in mild distress, affect appropriate Eyes: PERRL, Conjunctiva normal ENT: Oral Mucosa clear moist Neck: no JVD, no Abnormal Mass Or lumps Cardiovascular: S1 and S2 Present, no Murmur,  Peripheral Pulses Present Respiratory: normal respiratory effort, Bilateral Air entry equal and Decreased, no use of accessory muscle, Clear to Auscultation, no Crackles, no wheezes Abdomen: Bowel Sound present, Soft and mild tenderness, no hernia Skin: no redness, no Rash, no induration Extremities: no Pedal edema, no calf tenderness Neurologic: Grossly no focal neuro deficit. Bilaterally Equal motor  strength  Data Reviewed: CBC: Recent Labs  Lab 06/04/18 1106 06/04/18 1114 06/04/18 1659 06/04/18 2243 06/05/18 0501 06/06/18 0514  WBC 7.6  --   --   --  6.4 7.2  NEUTROABS 4.7  --   --   --   --   --   HGB 10.4* 11.6* 9.0* 8.8* 9.1* 9.4*  HCT 33.1* 34.0* 28.8* 27.7* 29.5* 30.0*  MCV 86.4  --   --   --  86.8 86.2  PLT 230  --   --   --  197 478   Basic Metabolic Panel: Recent Labs  Lab 06/04/18 1106 06/04/18 1114 06/05/18 0501 06/06/18 0514  NA 143 138 145 141  K 3.4* 3.8 3.4* 4.1  CL 101 97* 108 106  CO2 30  --  28 26  GLUCOSE 100* 94 60* 92  BUN 90* 92* 74* 61*  CREATININE 2.40* 2.30* 2.04* 1.81*  CALCIUM 10.1  --  9.4 9.4  MG  --   --   --  1.9    Liver Function Tests: Recent Labs  Lab 06/04/18 1106  AST 90*  ALT 89*  ALKPHOS 116  BILITOT 1.3*  PROT 8.0  ALBUMIN 3.7   Recent Labs  Lab 06/04/18 1106  LIPASE 29   No results for input(s): AMMONIA in the last 168 hours. Coagulation Profile: Recent Labs  Lab 06/05/18 0907 06/06/18 0514  INR 1.26 1.22   Cardiac Enzymes: No results for input(s): CKTOTAL, CKMB, CKMBINDEX, TROPONINI in the last 168 hours. BNP (last 3 results) No results for input(s): PROBNP in the last 8760 hours. CBG: Recent Labs  Lab 06/05/18 2037 06/05/18 2356 06/06/18 0442 06/06/18 0740 06/06/18 1211  GLUCAP 128* 121* 120* 95 108*   Studies: No results found.  Scheduled Meds: . allopurinol  100 mg Oral Daily  . insulin aspart  0-9 Units Subcutaneous Q4H  . metoprolol tartrate  25 mg Oral BID  . mometasone-formoterol  2 puff Inhalation BID  . pantoprazole  40 mg Intravenous Q12H  . sodium chloride flush  3 mL Intravenous Q12H   Continuous Infusions: . sodium chloride     PRN Meds: sodium chloride, acetaminophen **OR** acetaminophen, albuterol, cyclobenzaprine, ondansetron **OR** ondansetron (ZOFRAN) IV, sodium chloride flush  Time spent: 35 minutes  Author: Berle Mull, MD [facility] Pager:  [phone] 06/06/2018 2:57 PM  If 7PM-7AM, please contact night-coverage at [email or web address], password [facility]

## 2018-06-06 NOTE — Care Management Obs Status (Signed)
[place] NOTIFICATION   Patient Details  Name: GLYNN FREAS MRN: [id number] Date of Birth: 27-Feb-1946   Medicare Observation Status Notification Given:  Yes    MahabirJuliann Pulse, RN 06/06/2018, 9:10 AM

## 2018-06-06 NOTE — Care Management Note (Signed)
Case Management Note  Patient Details  Name: Kathleen [name] MRN: [id number] Date of Birth: 01/19/1946  Subjective/Objective: Patient provided w/HHC agency list-chose [facility] HHPT ordered. Has rw,02-[facility]. [facility] rep Santiago Glad aware of d/c. No further CM needs.                   Action/Plan:d/c home w/HHC.   Expected Discharge Date:                  Expected Discharge Plan:  [place]  In-House Referral:     Discharge planning Services     Post Acute Care Choice:  ([facility]-02,rw) Choice offered to:  Patient  DME Arranged:    DME Agency:     HH Arranged:  PT [place]:  [place]  Status of Service:  Completed, signed off  If discussed at [place] of Stay Meetings, dates discussed:    Additional Comments:  [name], RN 06/06/2018, 11:22 AM

## 2018-06-06 NOTE — Care Management CC44 (Signed)
Condition Code 44 Documentation Completed  Patient Details  Name: Kathleen [name] MRN: [id number] Date of Birth: 1946-07-04   Condition Code 44 given:  Yes Patient signature on Condition Code 44 notice:  Yes Documentation of 2 MD's agreement:  Yes Code 44 added to claim:  Yes    [name], RN 06/06/2018, 9:10 AM

## 2018-06-06 NOTE — Progress Notes (Signed)
[facility] Progress Note  Kathleen [name] 72 y.o. 11/19/1946  CC:  Melena   Subjective: denied any further episodes of bleeding. Denied abdominal pain, nausea and vomiting. Complaining of phlegm production.     Objective: Vital signs in last 24 hours: Vitals:   06/06/18 1139 06/06/18 1300  BP:  (!) 118/51  Pulse: (!) 102 92  Resp: 18 20  Temp:  98.1 F (36.7 C)  SpO2: 97% 98%    Physical Exam:  Gen. Alert/oriented 3. Not in record distress. Abdomen. Soft, nontender, nondistended, bowel sounds present. No peritoneal signs : Extremity. No edema.   Lab Results: Recent Labs    06/05/18 0501 06/06/18 0514  NA 145 141  K 3.4* 4.1  CL 108 106  CO2 28 26  GLUCOSE 60* 92  BUN 74* 61*  CREATININE 2.04* 1.81*  CALCIUM 9.4 9.4  MG  --  1.9   Recent Labs    06/04/18 1106  AST 90*  ALT 89*  ALKPHOS 116  BILITOT 1.3*  PROT 8.0  ALBUMIN 3.7   Recent Labs    06/04/18 1106  06/05/18 0501 06/06/18 0514  WBC 7.6  --  6.4 7.2  NEUTROABS 4.7  --   --   --   HGB 10.4*   < > 9.1* 9.4*  HCT 33.1*   < > 29.5* 30.0*  MCV 86.4  --  86.8 86.2  PLT 230  --  197 224   < > = values in this interval not displayed.   Recent Labs    06/05/18 0907 06/06/18 0514  LABPROT 15.7* 15.3*  INR 1.26 1.22      Assessment/Plan: - melena since last 10 days.  - occult blood positive stool. - acute blood loss anemia. Hemoglobin stable - Chronic atrial fibrillation. Last dose of Eliquis 06/22 morning according to patient. - abnormal LFTs since 11/2017  - Chronic kidney disease - weight loss  Recommendations ------------------------ - EGD today. - Ultrasound right upper quadrant negative for acute changes.hepatitis panel negative. - continue PPI.  Risks (bleeding, infection, bowel perforation that could require surgery, sedation-related changes in cardiopulmonary systems), benefits (identification and possible treatment of source of symptoms, exclusion of  certain causes of symptoms), and alternatives (watchful waiting, radiographic imaging studies, empiric medical treatment)  were explained to patient  in detail and patient wishes to proceed.     Otis Brace MD, [place] 06/06/2018, 1:15 PM  Contact #  [phone]

## 2018-06-06 NOTE — Progress Notes (Signed)
ANTICOAGULATION CONSULT NOTE - Initial Consult  Pharmacy Consult for resuming apixaban Indication: atrial fibrillation  Allergies  Allergen Reactions  . Penicillins Other (See Comments)    Has patient had a PCN reaction causing immediate rash, facial/tongue/throat swelling, SOB or lightheadedness with hypotension: Y Has patient had a PCN reaction causing severe rash involving mucus membranes or skin necrosis: Y Has patient had a PCN reaction that required hospitalization: Y Has patient had a PCN reaction occurring within the last 10 years: N If all of the above answers are "NO", then may proceed with Cephalosporin use.     Patient Measurements: Height: 5\' 4"  (162.6 cm) Weight: 212 lb 8.4 oz (96.4 kg) IBW/kg (Calculated) : 54.7  Vital Signs: Temp: 98.6 F (37 C) (06/25 1358) Temp Source: Oral (06/25 1358) BP: 124/70 (06/25 1410) Pulse Rate: 107 (06/25 1410)  Labs: Recent Labs    06/04/18 1106 06/04/18 1114  06/04/18 2243 06/05/18 0501 06/05/18 0907 06/06/18 0514  HGB 10.4* 11.6*   < > 8.8* 9.1*  --  9.4*  HCT 33.1* 34.0*   < > 27.7* 29.5*  --  30.0*  PLT 230  --   --   --  197  --  224  LABPROT  --   --   --   --   --  15.7* 15.3*  INR  --   --   --   --   --  1.26 1.22  CREATININE 2.40* 2.30*  --   --  2.04*  --  1.81*   < > = values in this interval not displayed.    Estimated Creatinine Clearance: 31.2 mL/min (A) (by C-G formula based on SCr of 1.81 mg/dL (H)).   Medical History: Past Medical History:  Diagnosis Date  . Arthritis   . Blood transfusion    no side affects  . CHF (congestive heart failure) ([place])   . Coronary artery disease    Cypher stent to the RCA in 2003  . Diabetes mellitus    Borderline  . GERD (gastroesophageal reflux disease)   . Hypertension   . Shortness of breath     Assessment: Patient was taking apixaban prior to admission for atrial fibrillation which has been held since 6/22 in anticipation of GI procedure. Patient  underwent EGD today -per GI notes,  EGD showed active bleeding from AVM in the second part of the duodenum. It was successfully treated with APC.EGD also showed mild gastritis in the antrum. Biopsies taken. Per GI notes, ok to resume anticoagulation and recheck CBC tomorrow morning.  Goal of Therapy:  Monitor platelets by anticoagulation protocol: Yes   Plan:  Apixaban to be resumed tonight per discussion with MD Continue apixaban 5 mg PO BID  CBC to be checked with AM labs tomorrow  Lenis Noon, PharmD, BCPS Clinical Pharmacist 06/06/2018,3:19 PM

## 2018-06-06 NOTE — Anesthesia Preprocedure Evaluation (Signed)
Anesthesia Evaluation  Patient identified by MRN, date of birth, ID band Patient awake    Reviewed: Allergy & Precautions, NPO status , Patient's Chart, lab work & pertinent test results  Airway Mallampati: II  TM Distance: >3 FB Neck ROM: Full    Dental no notable dental hx.    Pulmonary neg pulmonary ROS, former smoker,    Pulmonary exam normal breath sounds clear to auscultation       Cardiovascular hypertension, + CAD, + Past MI, + Cardiac Stents and + Peripheral Vascular Disease  Normal cardiovascular exam Rhythm:Regular Rate:Normal  Left ventricle:  The cavity size was normal. Systolic function was normal. The estimated ejection fraction was in the range of 60% to 65%. Wall motion was normal; there were no regional wall motion abnormalities. Normal sinus rhythm was absent. The study was not technically sufficient to allow evaluation of LV diastolic dysfunction due to atrial fibrillation. Doppler parameters are consistent with high ventricular filling pressure.  ------------------------------------------------------------------- Aortic valve:   Trileaflet; mildly thickened, mildly calcified leaflets. Mobility was not restricted.  Doppler:  Transvalvular velocity was within the normal range. There was no stenosis. There was mild regurgitation.   Neuro/Psych negative neurological ROS  negative psych ROS   GI/Hepatic Neg liver ROS, GERD  ,  Endo/Other  diabetes  Renal/GU negative Renal ROS  negative genitourinary   Musculoskeletal negative musculoskeletal ROS (+)   Abdominal   Peds negative pediatric ROS (+)  Hematology  (+) anemia ,   Anesthesia Other Findings   Reproductive/Obstetrics negative OB ROS                             Anesthesia Physical Anesthesia Plan  ASA: III  Anesthesia Plan: MAC   Post-op Pain Management:    Induction: Intravenous  PONV Risk Score and  Plan: 0 and Treatment may vary due to age or medical condition  Airway Management Planned: Simple Face Mask  Additional Equipment:   Intra-op Plan:   Post-operative Plan:   Informed Consent: I have reviewed the patients History and Physical, chart, labs and discussed the procedure including the risks, benefits and alternatives for the proposed anesthesia with the patient or authorized representative who has indicated his/her understanding and acceptance.   Dental advisory given  Plan Discussed with: CRNA and Surgeon  Anesthesia Plan Comments:         Anesthesia Quick Evaluation

## 2018-06-07 ENCOUNTER — Encounter (HOSPITAL_COMMUNITY): Payer: Self-pay | Admitting: Gastroenterology

## 2018-06-07 DIAGNOSIS — K922 Gastrointestinal hemorrhage, unspecified: Secondary | ICD-10-CM

## 2018-06-07 DIAGNOSIS — K921 Melena: Secondary | ICD-10-CM | POA: Diagnosis not present

## 2018-06-07 DIAGNOSIS — R945 Abnormal results of liver function studies: Secondary | ICD-10-CM

## 2018-06-07 DIAGNOSIS — I10 Essential (primary) hypertension: Secondary | ICD-10-CM

## 2018-06-07 DIAGNOSIS — E1122 Type 2 diabetes mellitus with diabetic chronic kidney disease: Secondary | ICD-10-CM

## 2018-06-07 DIAGNOSIS — E785 Hyperlipidemia, unspecified: Secondary | ICD-10-CM

## 2018-06-07 DIAGNOSIS — I482 Chronic atrial fibrillation: Secondary | ICD-10-CM

## 2018-06-07 DIAGNOSIS — I480 Paroxysmal atrial fibrillation: Secondary | ICD-10-CM

## 2018-06-07 DIAGNOSIS — R7989 Other specified abnormal findings of blood chemistry: Secondary | ICD-10-CM

## 2018-06-07 DIAGNOSIS — K2950 Unspecified chronic gastritis without bleeding: Secondary | ICD-10-CM | POA: Diagnosis present

## 2018-06-07 DIAGNOSIS — N184 Chronic kidney disease, stage 4 (severe): Secondary | ICD-10-CM | POA: Diagnosis not present

## 2018-06-07 DIAGNOSIS — K31811 Angiodysplasia of stomach and duodenum with bleeding: Secondary | ICD-10-CM | POA: Diagnosis not present

## 2018-06-07 DIAGNOSIS — K2980 Duodenitis without bleeding: Secondary | ICD-10-CM | POA: Diagnosis present

## 2018-06-07 LAB — GLUCOSE, CAPILLARY
Glucose-Capillary: 111 mg/dL — ABNORMAL HIGH (ref 70–99)
Glucose-Capillary: 154 mg/dL — ABNORMAL HIGH (ref 70–99)
Glucose-Capillary: 162 mg/dL — ABNORMAL HIGH (ref 70–99)

## 2018-06-07 LAB — BASIC METABOLIC PANEL
ANION GAP: 9 (ref 5–15)
BUN: 61 mg/dL — ABNORMAL HIGH (ref 8–23)
CHLORIDE: 105 mmol/L (ref 98–111)
CO2: 28 mmol/L (ref 22–32)
Calcium: 9.5 mg/dL (ref 8.9–10.3)
Creatinine, Ser: 1.94 mg/dL — ABNORMAL HIGH (ref 0.44–1.00)
GFR, EST AFRICAN AMERICAN: 28 mL/min — AB (ref >60)
GFR, EST NON AFRICAN AMERICAN: 24 mL/min — AB (ref >60)
Glucose, Bld: 124 mg/dL — ABNORMAL HIGH (ref 70–99)
POTASSIUM: 4.5 mmol/L (ref 3.5–5.1)
SODIUM: 142 mmol/L (ref 135–145)

## 2018-06-07 LAB — HEPATIC FUNCTION PANEL
ALK PHOS: 101 U/L (ref 38–126)
ALT: 61 U/L — AB (ref 0–44)
AST: 59 U/L — ABNORMAL HIGH (ref 15–41)
Albumin: 3.2 g/dL — ABNORMAL LOW (ref 3.5–5.0)
BILIRUBIN INDIRECT: 0.9 mg/dL (ref 0.3–0.9)
Bilirubin, Direct: 0.6 mg/dL — ABNORMAL HIGH (ref 0.0–0.2)
TOTAL PROTEIN: 7.2 g/dL (ref 6.5–8.1)
Total Bilirubin: 1.5 mg/dL — ABNORMAL HIGH (ref 0.3–1.2)

## 2018-06-07 LAB — CBC
HEMATOCRIT: 30.1 % — AB (ref 36.0–46.0)
HEMOGLOBIN: 9.3 g/dL — AB (ref 12.0–15.0)
MCH: 27 pg (ref 26.0–34.0)
MCHC: 30.9 g/dL (ref 30.0–36.0)
MCV: 87.2 fL (ref 78.0–100.0)
Platelets: 228 10*3/uL (ref 150–400)
RBC: 3.45 MIL/uL — ABNORMAL LOW (ref 3.87–5.11)
RDW: 19.2 % — AB (ref 11.5–15.5)
WBC: 7.5 10*3/uL (ref 4.0–10.5)

## 2018-06-07 LAB — HEMOGLOBIN AND HEMATOCRIT, BLOOD
HCT: 29.6 % — ABNORMAL LOW (ref 36.0–46.0)
Hemoglobin: 9.1 g/dL — ABNORMAL LOW (ref 12.0–15.0)

## 2018-06-07 LAB — HEMOGLOBIN A1C
Hgb A1c MFr Bld: 6.1 % — ABNORMAL HIGH (ref 4.8–5.6)
MEAN PLASMA GLUCOSE: 128.37 mg/dL

## 2018-06-07 MED ORDER — ISOSORB DINITRATE-HYDRALAZINE 20-37.5 MG PO TABS: 1.0000 | ORAL_TABLET | Freq: Three times a day (TID) | ORAL | 0 refills | Status: DC

## 2018-06-07 MED ORDER — FUROSEMIDE 80 MG PO TABS: 80.0000 mg | ORAL_TABLET | Freq: Two times a day (BID) | ORAL | Status: DC

## 2018-06-07 MED ORDER — PANTOPRAZOLE SODIUM 40 MG PO TBEC: 40.0000 mg | DELAYED_RELEASE_TABLET | Freq: Every day | ORAL | 1 refills | Status: DC

## 2018-06-07 NOTE — Discharge Summary (Signed)
Physician Discharge Summary  Kathleen [name] WSF:[id number] DOB: 1946/04/10 DOA: 06/04/2018  PCP: Nolene Ebbs, MD  Admit date: 06/04/2018 Discharge date: 06/07/2018  Time spent: 60 minutes  Recommendations for Outpatient Follow-up:  1. Follow-up with Nolene Ebbs, MD in 1 week.  On follow-up patient will need a CBC done to follow-up on H&H.  Patient also need a comprehensive metabolic profile done to follow-up on liver enzymes. 2. Follow-up with Dr. Alessandra Bevels in 6 weeks for follow-up on upper GI bleed and abnormal LFTs.   Discharge Diagnoses:  Principal Problem:   Upper GI bleed Active Problems:   Melena   Essential hypertension   Coronary atherosclerosis   Diabetes mellitus (HCC)   HLD (hyperlipidemia)   CKD (chronic kidney disease) stage 4, GFR 15-29 ml/min (HCC)   Atrial fibrillation, chronic (HCC)   Abnormal LFTs   Duodenitis   Chronic gastritis   Discharge Condition: Stable and improved  Diet recommendation: Heart healthy  Filed Weights   06/05/18 1100 06/06/18 1300  Weight: 96.4 kg (212 lb 8.4 oz) 96.4 kg (212 lb 8.4 oz)    History of present illness:  Per Dr. Charlestine Massed is a 72 y.o. female with medical history significant of chronic atrial fibrillation on Eliquis, coronary artery disease, essential hypertension, hyperlipidemia, since with one-week history of dark bloody and loose bowel movements.  She also admitted to lower abdominal cramping.  She denied any recent illnesses, no fevers, chills, worsening shortness of breath, new cough, chest pain, nausea or vomiting.  Denied any peripheral edema or rash, skin ulcers.  Last Eliquis dose was last night.  She last had a colonoscopy about a year ago which was unremarkable.  Did not recall ever having EGD.  I do not see colonoscopy report in our computer system or care everywhere.  She denied any ibuprofen use over-the-counter.  ED Course: Labs obtained which revealed hemoglobin 10.4, repeat hemoglobin  11.6.  BUN 90.  Fecal occult blood was positive.  GI was consulted    Hospital Course:  1.  Upper GI bleed with melena. Patient had presented with melanotic stools.  Concern for upper GI bleed.  Patient initially made n.p.o. and GI consulted.  Patient was placed on Protonix drip.  Hemoglobin was monitored.  Patient subsequently underwent an upper endoscopy that showed a bleeding AVM treated with APC.  Patient's hemoglobin stabilized.  GI recommended resuming patient's anticoagulation and monitoring a hemoglobin during the hospitalization.  Anticoagulation was resumed.  Hemoglobin remained stable.  Outpatient follow-up with GI.    2.  Chronic persistent A. fib, currently rate controlled Patient maintained on Lopressor for rate control.   CHADS-VASc Score 5. Eliquis initially held initially during the hospitalization secondary to ongoing GI bleed.  Once GI bleed he had been stabilized and worked up hemoglobin remained stable.  Eliquis was resumed per GI recommendations.  Outpatient follow-up.   3.  Essential hypertension/chronic diastolic CHF/coronary artery disease Remained stable during the hospitalization.  Patient's diuretics were held as patient was n.p.o.  Patient was maintained on home regimen of Lopressor.  Patient's isosorbide hydralazine was also held secondary to borderline blood pressure.  Patient remained in stable condition.  Patient's diuretics and isosorbide and hydralazine will be resumed 2 to 3 days post discharge.  Outpatient follow-up with cardiology as previously scheduled.  4.  Type 2 diabetes mellitus. Hemoglobin A1c was 6.1 on 06/07/2018.  Patient Lantus was held.  Patient maintained on sliding scale insulin.  Outpatient follow-up.  5.  Elevated LFT. ??  Etiology.  Right upper quadrant abdominal ultrasound which was done was unremarkable.  Hepatitis B surface antigen was negative.  Hepatitis C antibody was 0.2.  Outpatient follow-up with gastroenterology.    6.   Hypokalemia. Repleted.    Procedures:  Upper endoscopy 06/06/2018  Abdominal ultrasound 06/05/2018  Consultations:  Gastroenterology: Dr. Alessandra Bevels 06/05/2018  Discharge Exam: Vitals:   06/07/18 1139 06/07/18 1336  BP:  (!) 109/57  Pulse: 83 92  Resp:  18  Temp:  97.7 F (36.5 C)  SpO2:  100%    General: NAD Cardiovascular: RRR WITH 2/6 sem Respiratory: CTAB anterior lung fields.  Discharge Instructions   Discharge Instructions    Diet - low sodium heart healthy   Complete by:  As directed    Increase activity slowly   Complete by:  As directed      Allergies as of 06/07/2018      Reactions   Penicillins Other (See Comments)   Has patient had a PCN reaction causing immediate rash, facial/tongue/throat swelling, SOB or lightheadedness with hypotension: Y Has patient had a PCN reaction causing severe rash involving mucus membranes or skin necrosis: Y Has patient had a PCN reaction that required hospitalization: Y Has patient had a PCN reaction occurring within the last 10 years: N If all of the above answers are "NO", then may proceed with Cephalosporin use.      Medication List    TAKE these medications   albuterol (2.5 MG/3ML) 0.083% nebulizer solution Commonly known as:  PROVENTIL Take 2.5 mg by nebulization every 6 (six) hours as needed for wheezing or shortness of breath.   albuterol 108 (90 Base) MCG/ACT inhaler Commonly known as:  PROVENTIL HFA;VENTOLIN HFA Inhale 1-2 puffs into the lungs every 4 (four) hours as needed for wheezing or shortness of breath.   allopurinol 100 MG tablet Commonly known as:  ZYLOPRIM Take 100 mg by mouth daily.   COLCRYS 0.6 MG tablet Generic drug:  colchicine Take 0.6 mg by mouth daily as needed (gout).   cyclobenzaprine 10 MG tablet Commonly known as:  FLEXERIL Take 10 mg by mouth 3 (three) times daily as needed for muscle spasms.   docusate sodium 100 MG capsule Commonly known as:  COLACE Take 100 mg by mouth  2 (two) times daily as needed for mild constipation.   ELIQUIS 5 MG Tabs tablet Generic drug:  apixaban take 1 tablet by mouth twice a day   famotidine 20 MG tablet Commonly known as:  PEPCID Take 20 mg by mouth at bedtime.   furosemide 80 MG tablet Commonly known as:  LASIX Take 1 tablet (80 mg total) by mouth 2 (two) times daily. Start taking on:  06/10/2018 What changed:  These instructions start on 06/10/2018. If you are unsure what to do until then, ask your doctor or other care provider.   guaiFENesin 600 MG 12 hr tablet Commonly known as:  MUCINEX Take 600 mg by mouth 2 (two) times daily as needed (congestion).   isosorbide-hydrALAZINE 20-37.5 MG tablet Commonly known as:  BIDIL Take 1 tablet by mouth 3 (three) times daily. Start taking on:  06/10/2018 What changed:  These instructions start on 06/10/2018. If you are unsure what to do until then, ask your doctor or other care provider.   LANTUS SOLOSTAR 100 UNIT/ML Solostar Pen Generic drug:  Insulin Glargine INJ 10 UNITS [place] QAM   metoprolol tartrate 25 MG tablet Commonly known as:  LOPRESSOR TAKE 3 TABLETS BY MOUTH TWICE A DAY  What changed:    how much to take  how to take this  when to take this  Another medication with the same name was removed. Continue taking this medication, and follow the directions you see here.   pantoprazole 40 MG tablet Commonly known as:  PROTONIX Take 1 tablet (40 mg total) by mouth daily.   pravastatin 80 MG tablet Commonly known as:  PRAVACHOL Take 1 tablet (80 mg total) by mouth daily. What changed:  Another medication with the same name was removed. Continue taking this medication, and follow the directions you see here.   SYMBICORT 80-4.5 MCG/ACT inhaler Generic drug:  budesonide-formoterol Inhale 2 puffs into the lungs 2 (two) times daily.      Allergies  Allergen Reactions  . Penicillins Other (See Comments)    Has patient had a PCN reaction causing immediate rash,  facial/tongue/throat swelling, SOB or lightheadedness with hypotension: Y Has patient had a PCN reaction causing severe rash involving mucus membranes or skin necrosis: Y Has patient had a PCN reaction that required hospitalization: Y Has patient had a PCN reaction occurring within the last 10 years: N If all of the above answers are "NO", then may proceed with Cephalosporin use.    Follow-up Information    [facility].   Specialty:  [place] Why:  [facility] physical therapy/occupational therapy Contact information: [place] [address] [phone]        Otis Brace, MD. Schedule an appointment as soon as possible for a visit in 6 week(s).   Specialty:  Gastroenterology Why:  recent GI bleed, abnormal LFTs. Contact information: [place] [address] [phone]        Nolene Ebbs, MD. Schedule an appointment as soon as possible for a visit in 1 week(s).   Specialty:  Internal Medicine Why:  f/u in 1 week Contact information: [place] [address] [phone]            The results of significant diagnostics from this hospitalization (including imaging, microbiology, ancillary and laboratory) are listed below for reference.    Significant Diagnostic Studies: US Abdomen Limited Ruq  Result Date: 06/05/2018 CLINICAL DATA:  Abnormal LFTs EXAM: ULTRASOUND ABDOMEN LIMITED RIGHT UPPER QUADRANT COMPARISON:  08/15/2015 FINDINGS: Gallbladder: Surgically removed Common bile duct: Diameter: 2.7 mm Liver: No focal lesion identified. Within normal limits in parenchymal echogenicity. Portal vein is patent on color Doppler imaging with normal direction of blood flow towards the liver. IMPRESSION: Status post cholecystectomy. No acute abnormality noted. Electronically Signed   By: Inez Catalina M.D.   On: 06/05/2018 10:18    Microbiology: No results found for this or any  previous visit (from the past 240 hour(s)).   Labs: Basic Metabolic Panel: Recent Labs  Lab 06/04/18 1106 06/04/18 1114 06/05/18 0501 06/06/18 0514 06/07/18 0510  NA 143 138 145 141 142  K 3.4* 3.8 3.4* 4.1 4.5  CL 101 97* 108 106 105  CO2 30  --  28 26 28   GLUCOSE 100* 94 60* 92 124*  BUN 90* 92* 74* 61* 61*  CREATININE 2.40* 2.30* 2.04* 1.81* 1.94*  CALCIUM 10.1  --  9.4 9.4 9.5  MG  --   --   --  1.9  --    Liver Function Tests: Recent Labs  Lab 06/04/18 1106 06/07/18 0510  AST 90* 59*  ALT 89* 61*  ALKPHOS 116 101  BILITOT 1.3* 1.5*  PROT 8.0 7.2  ALBUMIN 3.7 3.2*   Recent Labs  Lab 06/04/18 1106  LIPASE 29   No results for input(s): AMMONIA in the last 168 hours. CBC: Recent Labs  Lab 06/04/18 1106  06/04/18 2243 06/05/18 0501 06/06/18 0514 06/07/18 0510 06/07/18 1440  WBC 7.6  --   --  6.4 7.2 7.5  --   NEUTROABS 4.7  --   --   --   --   --   --   HGB 10.4*   < > 8.8* 9.1* 9.4* 9.3* 9.1*  HCT 33.1*   < > 27.7* 29.5* 30.0* 30.1* 29.6*  MCV 86.4  --   --  86.8 86.2 87.2  --   PLT 230  --   --  197 224 228  --    < > = values in this interval not displayed.   Cardiac Enzymes: No results for input(s): CKTOTAL, CKMB, CKMBINDEX, TROPONINI in the last 168 hours. BNP: BNP (last 3 results) No results for input(s): BNP in the last 8760 hours.  ProBNP (last 3 results) No results for input(s): PROBNP in the last 8760 hours.  CBG: Recent Labs  Lab 06/06/18 2009 06/06/18 2356 06/07/18 0447 06/07/18 0728 06/07/18 1150  GLUCAP 158* 103* 111* 154* 162*       Signed:  Irine Seal MD.  [facility] 06/07/2018, 3:53 PM

## 2018-06-07 NOTE — Care Management Note (Signed)
Case Management Note  Patient Details  Name: NOUR RODRIGUES MRN: [id number] Date of Birth: 05/08/1946  Subjective/Objective: [facility] aware of HHPT/OT order. No further CM needs.                   Action/Plan:d/c home w/HHC.  Expected Discharge Date:  06/07/18               Expected Discharge Plan:  [place]  In-House Referral:     Discharge planning Services     Post Acute Care Choice:  ([facility]-02,rw) Choice offered to:  Patient  DME Arranged:    DME Agency:     HH Arranged:  PT, OT HH Agency:  [place]  Status of Service:  Completed, signed off  If discussed at [place] of Stay Meetings, dates discussed:    Additional Comments:  [name], RN 06/07/2018, 3:44 PM

## 2018-06-22 ENCOUNTER — Inpatient Hospital Stay (HOSPITAL_COMMUNITY)
Admission: EM | Admit: 2018-06-22 | Discharge: 2018-06-29 | DRG: 871 | Disposition: A | Discharge status: Home / Self Care | Payer: Medicare Other | Attending: Internal Medicine | Admitting: Internal Medicine

## 2018-06-22 ENCOUNTER — Encounter (HOSPITAL_COMMUNITY): Payer: Self-pay | Admitting: Emergency Medicine

## 2018-06-22 ENCOUNTER — Emergency Department (HOSPITAL_COMMUNITY): Payer: Medicare Other

## 2018-06-22 ENCOUNTER — Other Ambulatory Visit: Payer: Self-pay

## 2018-06-22 DIAGNOSIS — D500 Iron deficiency anemia secondary to blood loss (chronic): Secondary | ICD-10-CM | POA: Diagnosis present

## 2018-06-22 DIAGNOSIS — K922 Gastrointestinal hemorrhage, unspecified: Secondary | ICD-10-CM | POA: Diagnosis not present

## 2018-06-22 DIAGNOSIS — E860 Dehydration: Secondary | ICD-10-CM

## 2018-06-22 DIAGNOSIS — Z87891 Personal history of nicotine dependence: Secondary | ICD-10-CM

## 2018-06-22 DIAGNOSIS — E119 Type 2 diabetes mellitus without complications: Secondary | ICD-10-CM

## 2018-06-22 DIAGNOSIS — L89152 Pressure ulcer of sacral region, stage 2: Secondary | ICD-10-CM | POA: Diagnosis present

## 2018-06-22 DIAGNOSIS — M1990 Unspecified osteoarthritis, unspecified site: Secondary | ICD-10-CM | POA: Diagnosis present

## 2018-06-22 DIAGNOSIS — Z79899 Other long term (current) drug therapy: Secondary | ICD-10-CM

## 2018-06-22 DIAGNOSIS — I480 Paroxysmal atrial fibrillation: Secondary | ICD-10-CM

## 2018-06-22 DIAGNOSIS — K921 Melena: Secondary | ICD-10-CM | POA: Diagnosis not present

## 2018-06-22 DIAGNOSIS — G9341 Metabolic encephalopathy: Secondary | ICD-10-CM | POA: Diagnosis present

## 2018-06-22 DIAGNOSIS — R740 Nonspecific elevation of levels of transaminase and lactic acid dehydrogenase [LDH]: Secondary | ICD-10-CM | POA: Diagnosis present

## 2018-06-22 DIAGNOSIS — D62 Acute posthemorrhagic anemia: Secondary | ICD-10-CM | POA: Diagnosis present

## 2018-06-22 DIAGNOSIS — N184 Chronic kidney disease, stage 4 (severe): Secondary | ICD-10-CM | POA: Diagnosis present

## 2018-06-22 DIAGNOSIS — I5032 Chronic diastolic (congestive) heart failure: Secondary | ICD-10-CM | POA: Diagnosis present

## 2018-06-22 DIAGNOSIS — I214 Non-ST elevation (NSTEMI) myocardial infarction: Secondary | ICD-10-CM

## 2018-06-22 DIAGNOSIS — Z7951 Long term (current) use of inhaled steroids: Secondary | ICD-10-CM

## 2018-06-22 DIAGNOSIS — I481 Persistent atrial fibrillation: Secondary | ICD-10-CM | POA: Diagnosis present

## 2018-06-22 DIAGNOSIS — I130 Hypertensive heart and chronic kidney disease with heart failure and stage 1 through stage 4 chronic kidney disease, or unspecified chronic kidney disease: Secondary | ICD-10-CM | POA: Diagnosis present

## 2018-06-22 DIAGNOSIS — K269 Duodenal ulcer, unspecified as acute or chronic, without hemorrhage or perforation: Secondary | ICD-10-CM | POA: Diagnosis present

## 2018-06-22 DIAGNOSIS — I252 Old myocardial infarction: Secondary | ICD-10-CM | POA: Diagnosis not present

## 2018-06-22 DIAGNOSIS — N179 Acute kidney failure, unspecified: Secondary | ICD-10-CM | POA: Diagnosis present

## 2018-06-22 DIAGNOSIS — Q2733 Arteriovenous malformation of digestive system vessel: Secondary | ICD-10-CM | POA: Diagnosis not present

## 2018-06-22 DIAGNOSIS — K2980 Duodenitis without bleeding: Secondary | ICD-10-CM | POA: Diagnosis present

## 2018-06-22 DIAGNOSIS — K5900 Constipation, unspecified: Secondary | ICD-10-CM | POA: Diagnosis present

## 2018-06-22 DIAGNOSIS — L8990 Pressure ulcer of unspecified site, unspecified stage: Secondary | ICD-10-CM

## 2018-06-22 DIAGNOSIS — K5521 Angiodysplasia of colon with hemorrhage: Secondary | ICD-10-CM | POA: Diagnosis present

## 2018-06-22 DIAGNOSIS — I9589 Other hypotension: Secondary | ICD-10-CM

## 2018-06-22 DIAGNOSIS — E785 Hyperlipidemia, unspecified: Secondary | ICD-10-CM | POA: Diagnosis present

## 2018-06-22 DIAGNOSIS — R571 Hypovolemic shock: Secondary | ICD-10-CM | POA: Diagnosis present

## 2018-06-22 DIAGNOSIS — Z8261 Family history of arthritis: Secondary | ICD-10-CM

## 2018-06-22 DIAGNOSIS — N189 Chronic kidney disease, unspecified: Secondary | ICD-10-CM

## 2018-06-22 DIAGNOSIS — E861 Hypovolemia: Secondary | ICD-10-CM

## 2018-06-22 DIAGNOSIS — E440 Moderate protein-calorie malnutrition: Secondary | ICD-10-CM | POA: Diagnosis present

## 2018-06-22 DIAGNOSIS — E1122 Type 2 diabetes mellitus with diabetic chronic kidney disease: Secondary | ICD-10-CM | POA: Diagnosis present

## 2018-06-22 DIAGNOSIS — R1013 Epigastric pain: Secondary | ICD-10-CM | POA: Diagnosis not present

## 2018-06-22 DIAGNOSIS — I482 Chronic atrial fibrillation, unspecified: Secondary | ICD-10-CM | POA: Diagnosis present

## 2018-06-22 DIAGNOSIS — I2510 Atherosclerotic heart disease of native coronary artery without angina pectoris: Secondary | ICD-10-CM | POA: Diagnosis present

## 2018-06-22 DIAGNOSIS — Z96659 Presence of unspecified artificial knee joint: Secondary | ICD-10-CM | POA: Diagnosis present

## 2018-06-22 DIAGNOSIS — Z7901 Long term (current) use of anticoagulants: Secondary | ICD-10-CM | POA: Diagnosis not present

## 2018-06-22 DIAGNOSIS — Z9049 Acquired absence of other specified parts of digestive tract: Secondary | ICD-10-CM

## 2018-06-22 DIAGNOSIS — Z8249 Family history of ischemic heart disease and other diseases of the circulatory system: Secondary | ICD-10-CM

## 2018-06-22 DIAGNOSIS — K219 Gastro-esophageal reflux disease without esophagitis: Secondary | ICD-10-CM | POA: Diagnosis present

## 2018-06-22 DIAGNOSIS — D649 Anemia, unspecified: Secondary | ICD-10-CM | POA: Diagnosis not present

## 2018-06-22 DIAGNOSIS — Z955 Presence of coronary angioplasty implant and graft: Secondary | ICD-10-CM

## 2018-06-22 DIAGNOSIS — Z880 Allergy status to penicillin: Secondary | ICD-10-CM

## 2018-06-22 DIAGNOSIS — R638 Other symptoms and signs concerning food and fluid intake: Secondary | ICD-10-CM

## 2018-06-22 DIAGNOSIS — R7401 Elevation of levels of liver transaminase levels: Secondary | ICD-10-CM | POA: Diagnosis present

## 2018-06-22 LAB — BASIC METABOLIC PANEL WITH GFR
Anion gap: 12 (ref 5–15)
BUN: 95 mg/dL — ABNORMAL HIGH (ref 8–23)
CO2: 27 mmol/L (ref 22–32)
Calcium: 10 mg/dL (ref 8.9–10.3)
Chloride: 102 mmol/L (ref 98–111)
Creatinine, Ser: 3.36 mg/dL — ABNORMAL HIGH (ref 0.44–1.00)
GFR calc Af Amer: 15 mL/min — ABNORMAL LOW
GFR calc non Af Amer: 13 mL/min — ABNORMAL LOW
Glucose, Bld: 139 mg/dL — ABNORMAL HIGH (ref 70–99)
Potassium: 3.9 mmol/L (ref 3.5–5.1)
Sodium: 141 mmol/L (ref 135–145)

## 2018-06-22 LAB — I-STAT CHEM 8, ED
BUN: 87 mg/dL — AB (ref 8–23)
CREATININE: 3.4 mg/dL — AB (ref 0.44–1.00)
Calcium, Ion: 1.14 mmol/L — ABNORMAL LOW (ref 1.15–1.40)
Chloride: 99 mmol/L (ref 98–111)
Glucose, Bld: 131 mg/dL — ABNORMAL HIGH (ref 70–99)
HEMATOCRIT: 29 % — AB (ref 36.0–46.0)
Hemoglobin: 9.9 g/dL — ABNORMAL LOW (ref 12.0–15.0)
Potassium: 3.8 mmol/L (ref 3.5–5.1)
Sodium: 138 mmol/L (ref 135–145)
TCO2: 25 mmol/L (ref 22–32)

## 2018-06-22 LAB — CBC
HEMATOCRIT: 26.5 % — AB (ref 36.0–46.0)
HEMOGLOBIN: 8.3 g/dL — AB (ref 12.0–15.0)
MCH: 25.9 pg — AB (ref 26.0–34.0)
MCHC: 31.3 g/dL (ref 30.0–36.0)
MCV: 82.6 fL (ref 78.0–100.0)
Platelets: 300 10*3/uL (ref 150–400)
RBC: 3.21 MIL/uL — ABNORMAL LOW (ref 3.87–5.11)
RDW: 19.3 % — ABNORMAL HIGH (ref 11.5–15.5)
WBC: 8.7 10*3/uL (ref 4.0–10.5)

## 2018-06-22 LAB — HEPATIC FUNCTION PANEL
ALT: 71 U/L — ABNORMAL HIGH (ref 0–44)
AST: 61 U/L — AB (ref 15–41)
Albumin: 3.6 g/dL (ref 3.5–5.0)
Alkaline Phosphatase: 89 U/L (ref 38–126)
BILIRUBIN DIRECT: 0.4 mg/dL — AB (ref 0.0–0.2)
BILIRUBIN TOTAL: 1.1 mg/dL (ref 0.3–1.2)
Indirect Bilirubin: 0.7 mg/dL (ref 0.3–0.9)
Total Protein: 7.3 g/dL (ref 6.5–8.1)

## 2018-06-22 LAB — CBG MONITORING, ED: Glucose-Capillary: 138 mg/dL — ABNORMAL HIGH (ref 70–99)

## 2018-06-22 LAB — I-STAT CG4 LACTIC ACID, ED: Lactic Acid, Venous: 1.66 mmol/L (ref 0.5–1.9)

## 2018-06-22 LAB — PREPARE RBC (CROSSMATCH)

## 2018-06-22 LAB — PROTIME-INR
INR: 1.54
Prothrombin Time: 18.4 seconds — ABNORMAL HIGH (ref 11.4–15.2)

## 2018-06-22 LAB — I-STAT TROPONIN, ED: Troponin i, poc: 0.03 ng/mL (ref 0.00–0.08)

## 2018-06-22 LAB — LIPASE, BLOOD: Lipase: 35 U/L (ref 11–51)

## 2018-06-22 LAB — MRSA PCR SCREENING: MRSA by PCR: NEGATIVE

## 2018-06-22 MED ORDER — SODIUM CHLORIDE 0.9% IV SOLUTION: Freq: Once | INTRAVENOUS | Status: DC

## 2018-06-22 MED ORDER — SODIUM CHLORIDE 0.9 % IV SOLN: Freq: Once | INTRAVENOUS | Status: DC

## 2018-06-22 MED ORDER — ONDANSETRON HCL 4 MG/2ML IJ SOLN: 4.0000 mg | Freq: Four times a day (QID) | INTRAMUSCULAR | Status: DC | PRN

## 2018-06-22 MED ORDER — PRAVASTATIN SODIUM 40 MG PO TABS
80.0000 mg | ORAL_TABLET | Freq: Every day | ORAL | Status: DC
  Administered 2018-06-23 – 2018-06-29 (×6): 80 mg via ORAL
  Filled 2018-06-22 (×2): qty 4
  Filled 2018-06-22 (×4): qty 2

## 2018-06-22 MED ORDER — GUAIFENESIN ER 600 MG PO TB12: 600.0000 mg | ORAL_TABLET | Freq: Two times a day (BID) | ORAL | Status: DC | PRN

## 2018-06-22 MED ORDER — METRONIDAZOLE IN NACL 5-0.79 MG/ML-% IV SOLN
500.0000 mg | Freq: Once | INTRAVENOUS | Status: AC
  Administered 2018-06-22: 500 mg via INTRAVENOUS
  Filled 2018-06-22: qty 100

## 2018-06-22 MED ORDER — DOCUSATE SODIUM 100 MG PO CAPS: 100.0000 mg | ORAL_CAPSULE | Freq: Two times a day (BID) | ORAL | Status: DC | PRN

## 2018-06-22 MED ORDER — SODIUM CHLORIDE 0.9 % IV SOLN
8.0000 mg/h | INTRAVENOUS | Status: AC
Start: 2018-06-22 — End: 2018-06-25
  Administered 2018-06-22 – 2018-06-24 (×5): 8 mg/h via INTRAVENOUS
  Filled 2018-06-22 (×10): qty 80

## 2018-06-22 MED ORDER — ALLOPURINOL 100 MG PO TABS
100.0000 mg | ORAL_TABLET | Freq: Every day | ORAL | Status: DC
  Administered 2018-06-23 – 2018-06-29 (×6): 100 mg via ORAL
  Filled 2018-06-22 (×6): qty 1

## 2018-06-22 MED ORDER — OXYCODONE HCL 5 MG PO TABS: 5.0000 mg | ORAL_TABLET | ORAL | Status: DC | PRN

## 2018-06-22 MED ORDER — SODIUM CHLORIDE 0.9% FLUSH
3.0000 mL | Freq: Two times a day (BID) | INTRAVENOUS | Status: DC
  Administered 2018-06-22 – 2018-06-29 (×12): 3 mL via INTRAVENOUS

## 2018-06-22 MED ORDER — ONDANSETRON HCL 4 MG PO TABS: 4.0000 mg | ORAL_TABLET | Freq: Four times a day (QID) | ORAL | Status: DC | PRN

## 2018-06-22 MED ORDER — ACETAMINOPHEN 650 MG RE SUPP: 650.0000 mg | Freq: Four times a day (QID) | RECTAL | Status: DC | PRN

## 2018-06-22 MED ORDER — SODIUM CHLORIDE 0.9 % IV BOLUS: 1000.0000 mL | Freq: Once | INTRAVENOUS | Status: DC

## 2018-06-22 MED ORDER — SODIUM CHLORIDE 0.9 % IV BOLUS
1000.0000 mL | Freq: Once | INTRAVENOUS | Status: AC
  Administered 2018-06-22: 1000 mL via INTRAVENOUS

## 2018-06-22 MED ORDER — CYCLOBENZAPRINE HCL 10 MG PO TABS
10.0000 mg | ORAL_TABLET | Freq: Three times a day (TID) | ORAL | Status: DC | PRN
Start: 2018-06-22 — End: 2018-06-29

## 2018-06-22 MED ORDER — PANTOPRAZOLE SODIUM 40 MG IV SOLR
80.0000 mg | Freq: Once | INTRAVENOUS | Status: AC
  Administered 2018-06-22: 80 mg via INTRAVENOUS
  Filled 2018-06-22: qty 80

## 2018-06-22 MED ORDER — ALBUTEROL SULFATE (2.5 MG/3ML) 0.083% IN NEBU
2.5000 mg | INHALATION_SOLUTION | Freq: Four times a day (QID) | RESPIRATORY_TRACT | Status: DC | PRN
Start: 2018-06-22 — End: 2018-06-29

## 2018-06-22 MED ORDER — SODIUM CHLORIDE 0.9 % IV SOLN
2.0000 g | Freq: Once | INTRAVENOUS | Status: AC
  Administered 2018-06-22: 2 g via INTRAVENOUS
  Filled 2018-06-22: qty 2

## 2018-06-22 MED ORDER — MOMETASONE FURO-FORMOTEROL FUM 100-5 MCG/ACT IN AERO
2.0000 | INHALATION_SPRAY | Freq: Two times a day (BID) | RESPIRATORY_TRACT | Status: DC
  Administered 2018-06-22 – 2018-06-29 (×12): 2 via RESPIRATORY_TRACT
  Filled 2018-06-22 (×2): qty 8.8

## 2018-06-22 MED ORDER — ACETAMINOPHEN 325 MG PO TABS
650.0000 mg | ORAL_TABLET | Freq: Four times a day (QID) | ORAL | Status: DC | PRN
  Administered 2018-06-24: 650 mg via ORAL
  Filled 2018-06-22: qty 2

## 2018-06-22 NOTE — ED Notes (Signed)
ED Provider at bedside. 

## 2018-06-22 NOTE — ED Triage Notes (Addendum)
Pt complaint of hypotension for 5 days; endorses dizziness; recent abdominal surgery this past month for "bleeding spots."

## 2018-06-22 NOTE — H&P (Signed)
History and Physical    Kathleen [name] YTK:[id number] DOB: 1946-10-22 DOA: 06/22/2018  PCP: Nolene Ebbs, MD  Patient coming from: home  I have personally briefly reviewed patient's old medical records in [place]  Chief Complaint: epigastric pain and dizziness  HPI: Kathleen [name] is a 72 y.o. female with medical history significant of chronic Afib on Eliquis, diastolic CHF, CAD/NSTEMI, DM-2, CKD stage 4 with baseline Cr ~2, chronic transaminitis, recent hospitalization at [facility] for melena and anemia due to UGIB when she had GI evaluation through EGD finding a bleeding AVM treated with APC, now presented with upper abdominal pain today. Pt stated she was doing well except for low appetite since her recent discharge. She was discharged on 6/26 and followed up with her PCP in 6/28 when she resumed Eliquis. Ever since her discharge, she continued to see melena intermittently but no abdominal pain or dizziness. Per patient, she was doing quite OK and moving around the house pretty well up to yesterday, however, she felt upper abdominal pain this morning and anything she tried to eat will hurt her stomach. Meanwhile she also felt dizziness and not quite mentally sharp as she was, therefore her daughters brought her to hospital for further evaluation. Other than feeling dizziness or lightheadedness and abdominal pain, she denies chest pain, SOB, PND, orthopnea, or palpitation.   ED Course:  In ER, she was found to have hypotension with low mentation (SBP in 60's initially). Hb down to 8.3 when her last Hb was 9.1 upon discharge. Cr up to 3.4 from baseline 1.94. ER gave her 1L NS bolus and started 2 units PRBC and PPI drip, now her BP has normalized and she's more alert and orientated and can provide history by herself. ER has contacted her gastroenterologist and was recommended to keep her NPO after midnight and likely re-scope her in the morning. hospitalist is consulted to admit the patient for  further evaluation.   Review of Systems: As per HPI otherwise 10 point review of systems negative.     Past Medical History:  Diagnosis Date  . Arthritis   . Blood transfusion    no side affects  . CHF (congestive heart failure) ([place])   . Coronary artery disease    Cypher stent to the RCA in 2003  . Diabetes mellitus    Borderline  . GERD (gastroesophageal reflux disease)   . Hypertension   . Shortness of breath     Past Surgical History:  Procedure Laterality Date  . BIOPSY  06/06/2018   Procedure: BIOPSY;  Surgeon: Otis Brace, MD;  Location: [facility] ENDOSCOPY;  Service: Gastroenterology;;  . CARDIOVERSION N/A 03/17/2015   Procedure: CARDIOVERSION;  Surgeon: Sueanne Margarita, MD;  Location: [facility] ENDOSCOPY;  Service: Cardiovascular;  Laterality: N/A;  . CHOLECYSTECTOMY    . CORONARY ANGIOPLASTY WITH STENT PLACEMENT    . ESOPHAGOGASTRODUODENOSCOPY (EGD) WITH PROPOFOL N/A 06/06/2018   Procedure: ESOPHAGOGASTRODUODENOSCOPY (EGD) WITH PROPOFOL;  Surgeon: Otis Brace, MD;  Location: [facility] ENDOSCOPY;  Service: Gastroenterology;  Laterality: N/A;  . HOT HEMOSTASIS N/A 06/06/2018   Procedure: HOT HEMOSTASIS (ARGON PLASMA COAGULATION/BICAP);  Surgeon: Otis Brace, MD;  Location: [facility];  Service: Gastroenterology;  Laterality: N/A;  . TOTAL KNEE ARTHROPLASTY       reports that she quit smoking about 4 years ago. Her smoking use included cigarettes. She has a 9.00 pack-year smoking history. She has never used smokeless tobacco. She reports that she does not drink alcohol or use drugs.  Allergies  Allergen Reactions  . Penicillins Hives and Shortness Of Breath    Tolerates Ceftin, Ceftriaxone, and Cefepime Has patient had a PCN reaction causing immediate rash, facial/tongue/throat swelling, SOB or lightheadedness with hypotension: Y Has patient had a PCN reaction causing severe rash involving mucus membranes or skin necrosis: Y Has patient had a PCN reaction that required  hospitalization: Y Has patient had a PCN reaction occurring within the last 10 years: N     Family History  Problem Relation Age of Onset  . Hypertension Father   . Heart disease Father   . Gout Father   . Arthritis Father   . Heart attack Father   . Heart attack Brother   . Hypertension Brother   . Hypertension Sister   . Stroke Neg Hx       Prior to Admission medications   Medication Sig Start Date End Date Taking? Authorizing Provider  albuterol (PROVENTIL HFA;VENTOLIN HFA) 108 (90 Base) MCG/ACT inhaler Inhale 1-2 puffs into the lungs every 4 (four) hours as needed for wheezing or shortness of breath. 12/06/17  Yes Mariel Aloe, MD  albuterol (PROVENTIL) (2.5 MG/3ML) 0.083% nebulizer solution Take 2.5 mg by nebulization every 6 (six) hours as needed for wheezing or shortness of breath.   Yes [provider]  allopurinol (ZYLOPRIM) 100 MG tablet Take 100 mg by mouth daily.   Yes [provider]  ELIQUIS 5 MG TABS tablet take 1 tablet by mouth twice a day 07/13/16  Yes Minus Breeding, MD  famotidine (PEPCID) 20 MG tablet Take 20 mg by mouth at bedtime.  02/04/18  Yes [provider]  furosemide (LASIX) 80 MG tablet Take 1 tablet (80 mg total) by mouth 2 (two) times daily. 06/10/18  Yes Eugenie Filler, MD  isosorbide-hydrALAZINE (BIDIL) 20-37.5 MG tablet Take 1 tablet by mouth 3 (three) times daily. 06/10/18  Yes Eugenie Filler, MD  LANTUS SOLOSTAR 100 UNIT/ML Solostar Pen INJ 10 UNITS [place] QAM 03/17/18  Yes [provider]  metoprolol tartrate (LOPRESSOR) 25 MG tablet TAKE 3 TABLETS BY MOUTH TWICE A DAY Patient taking differently: TAKE 1 TABLET BY MOUTH TWICE A DAY 02/06/18  Yes Minus Breeding, MD  pantoprazole (PROTONIX) 40 MG tablet Take 1 tablet (40 mg total) by mouth daily. 06/07/18 06/07/19 Yes Eugenie Filler, MD  pravastatin (PRAVACHOL) 80 MG tablet Take 80 mg by mouth daily.   Yes [provider]  SYMBICORT 80-4.5 MCG/ACT  inhaler Inhale 2 puffs into the lungs 2 (two) times daily. 02/02/18  Yes [provider]  COLCRYS 0.6 MG tablet Take 0.6 mg by mouth daily as needed (gout).  02/04/18   [provider]  cyclobenzaprine (FLEXERIL) 10 MG tablet Take 10 mg by mouth 3 (three) times daily as needed for muscle spasms.    [provider]  docusate sodium (COLACE) 100 MG capsule Take 100 mg by mouth 2 (two) times daily as needed for mild constipation.    [provider]  guaiFENesin (MUCINEX) 600 MG 12 hr tablet Take 600 mg by mouth 2 (two) times daily as needed (congestion).    [provider]  pravastatin (PRAVACHOL) 80 MG tablet Take 1 tablet (80 mg total) by mouth daily. 11/23/11 03/08/18  Minus Breeding, MD    Physical Exam: Vitals:   06/22/18 1820 06/22/18 1840 06/22/18 1855 06/22/18 1900  BP: 119/87 118/61 118/61 110/64  Pulse: 79 61 68 75  Resp: 12 13 17 17   Temp: (!) 97.4  F (36.3 C)     TempSrc: Oral     SpO2: 100% 99% 98%   Weight:      Height:        Constitutional: NAD, calm, comfortable Vitals:   06/22/18 1820 06/22/18 1840 06/22/18 1855 06/22/18 1900  BP: 119/87 118/61 118/61 110/64  Pulse: 79 61 68 75  Resp: 12 13 17 17   Temp: (!) 97.4 F (36.3 C)     TempSrc: Oral     SpO2: 100% 99% 98%   Weight:      Height:       Eyes: PERRL, lids and conjunctivae normal; conjunctival pallor ENMT: Mucous membranes are moist. Posterior pharynx clear of any exudate or lesions.Normal dentition.  Neck: normal, supple, no masses, no thyromegaly, no JVD Respiratory: clear to auscultation bilaterally, no wheezing, no crackles. Normal respiratory effort. No accessory muscle use.  Cardiovascular: normal rate and irregular rhythm, no murmurs / rubs / gallops. No extremity edema. 2+ pedal pulses. No carotid bruits.  Abdomen: both epigastric and periumbilical tenderness, no masses palpated. No hepatosplenomegaly. Bowel sounds positive.  Musculoskeletal: no clubbing /  cyanosis. No joint deformity upper and lower extremities. Good ROM, no contractures. Normal muscle tone.  Skin: no rashes, lesions, ulcers. No induration Neurologic: CN 2-12 grossly intact. Sensation intact, DTR normal. Strength 5/5 in all 4.  Psychiatric: Normal judgment and insight. Alert and oriented x 3. Normal mood.      Labs on Admission: I have personally reviewed following labs and imaging studies  CBC: Recent Labs  Lab 06/22/18 1512 06/22/18 1516  WBC 8.7  --   HGB 8.3* 9.9*  HCT 26.5* 29.0*  MCV 82.6  --   PLT 300  --    Basic Metabolic Panel: Recent Labs  Lab 06/22/18 1512 06/22/18 1516  NA 141 138  K 3.9 3.8  CL 102 99  CO2 27  --   GLUCOSE 139* 131*  BUN 95* 87*  CREATININE 3.36* 3.40*  CALCIUM 10.0  --    GFR: Estimated Creatinine Clearance: 16.1 mL/min (A) (by C-G formula based on SCr of 3.4 mg/dL (H)). Liver Function Tests: Recent Labs  Lab 06/22/18 1512  AST 61*  ALT 71*  ALKPHOS 89  BILITOT 1.1  PROT 7.3  ALBUMIN 3.6   Recent Labs  Lab 06/22/18 1512  LIPASE 35   No results for input(s): AMMONIA in the last 168 hours. Coagulation Profile: Recent Labs  Lab 06/22/18 1512  INR 1.54   Cardiac Enzymes: No results for input(s): CKTOTAL, CKMB, CKMBINDEX, TROPONINI in the last 168 hours. BNP (last 3 results) No results for input(s): PROBNP in the last 8760 hours. HbA1C: No results for input(s): HGBA1C in the last 72 hours. CBG: Recent Labs  Lab 06/22/18 1458  GLUCAP 138*   Lipid Profile: No results for input(s): CHOL, HDL, LDLCALC, TRIG, CHOLHDL, LDLDIRECT in the last 72 hours. Thyroid Function Tests: No results for input(s): TSH, T4TOTAL, FREET4, T3FREE, THYROIDAB in the last 72 hours. Anemia Panel: No results for input(s): VITAMINB12, FOLATE, FERRITIN, TIBC, IRON, RETICCTPCT in the last 72 hours. Urine analysis:    Component Value Date/Time   COLORURINE YELLOW 06/04/2018 0923   APPEARANCEUR CLEAR 06/04/2018 0923   LABSPEC  1.011 06/04/2018 0923   PHURINE 5.0 06/04/2018 0923   GLUCOSEU NEGATIVE 06/04/2018 0923   HGBUR SMALL (A) 06/04/2018 0923   BILIRUBINUR NEGATIVE 06/04/2018 [place] 06/04/2018 0923   PROTEINUR NEGATIVE 06/04/2018 0923   UROBILINOGEN 1.0 08/15/2015 0915  NITRITE NEGATIVE 06/04/2018 0923   LEUKOCYTESUR NEGATIVE 06/04/2018 3295    Radiological Exams on Admission: Ct Abdomen Pelvis Wo Contrast  Result Date: 06/22/2018 CLINICAL DATA:  72 y/o  F; mid abdominal pain.  Recent GI bleed. EXAM: CT ABDOMEN AND PELVIS WITHOUT CONTRAST TECHNIQUE: Multidetector CT imaging of the abdomen and pelvis was performed following the standard protocol without IV contrast. COMPARISON:  08/15/2015 CT abdomen and pelvis. FINDINGS: Lower chest: No acute abnormality. Hepatobiliary: No focal liver abnormality is seen. Status post cholecystectomy. No biliary dilatation. Pancreas: Unremarkable. No pancreatic ductal dilatation or surrounding inflammatory changes. Spleen: Normal in size without focal abnormality. Adrenals/Urinary Tract: Stable right adrenal adenoma and bilateral hypertrophy. Right kidney cyst measuring up to 16 mm cyst. Kidneys are otherwise unremarkable, without renal calculi, focal lesion, or hydronephrosis. Bladder is unremarkable. Stomach/Bowel: Stomach is within normal limits. Appendix appears normal. No evidence of bowel wall thickening, distention, or inflammatory changes. Mild sigmoid diverticulosis without findings of acute diverticulitis. Vascular/Lymphatic: Aortic atherosclerosis. No enlarged abdominal or pelvic lymph nodes. Reproductive: Uterus and bilateral adnexa are unremarkable. Other: Stable large midline ventral hernia containing fat. Musculoskeletal: No fracture is seen. L4-5 and L5-S1 grade 1 anterolisthesis. Lumbar spondylosis with prominent lower lumbar facet arthrosis. IMPRESSION: 1. No acute process identified. 2. Mild sigmoid diverticulosis, no findings of acute  diverticulitis. 3. Stable right adrenal adenoma and bilateral hypertrophy. 4. Aortic atherosclerosis. 5. Stable large midline ventral hernia containing fat. 6. Lower lumbar spondylosis with prominent facet arthrosis. Electronically Signed   By: Kristine Garbe M.D.   On: 06/22/2018 16:29   Dg Chest Portable 1 View  Result Date: 06/22/2018 CLINICAL DATA:  Hypotension and dizziness for 5 days. EXAM: PORTABLE CHEST 1 VIEW COMPARISON:  Single-view of the chest 12/05/2017. FINDINGS: The lungs are clear. Heart size is enlarged. No pneumothorax or pleural effusion. Aortic atherosclerosis noted. No acute bony abnormality. IMPRESSION: Cardiomegaly without acute disease. Atherosclerosis. Electronically Signed   By: Inge Rise M.D.   On: 06/22/2018 15:54    EKG: Independently reviewed. Rate controlled Afib. No EKG changes.   Assessment/Plan Principal Problem:   Hypotension due to hypovolemia Active Problems:   Diabetes mellitus (HCC)   Non-ST elevation MI (NSTEMI) (HCC)   Chronic diastolic CHF (congestive heart failure) (HCC)   Melena   Atrial fibrillation, chronic (HCC)   Upper GI bleed   Duodenitis   Acute metabolic encephalopathy   Acute on chronic renal failure (HCC)   Transaminitis   Anemia due to GI blood loss   Epigastric pain   Decreased oral intake   Hypovolemic shock ([place])     Plan: -admit to step down unit due to hypovolemic shock with hypotension -continue 2 units PRBC transfusion but will monitor O2 sat, as pt has diastolic CHF history; CBC in am -continue PPI drip -due to acute on chronic renal failure, will hold off lasix, but will allow iv lasix PRN for SOB during or post transfusion should she develop volume overload -clear liquid diet for now and NPO after midnight; appreciate GI consult in am. ER has discussed with GI and possible EGD in the am -PRN anti-emesis and pain control -mental status change is likely due to shock and hypoperfusion, she's nearly  back to her baseline mental status -chronic afib is rate controlled, no Eliquis due to GIB -hold off home BP medications until BP has been stablizied overnight   DVT prophylaxis: SCD  Code Status: full code  Family Communication: discussed with two daughters at bedside  Disposition Plan:  likely home in 2-3 days  Consults called: GI [place], Venia Minks, MD Admission status: SDU    [name] MD [facility] Pager [phone]  If 7PM-7AM, please contact night-coverage [email or web address] Password [facility]  06/22/2018, 7:22 PM

## 2018-06-22 NOTE — Progress Notes (Signed)
A consult was received from an ED physician for Cefepime per pharmacy dosing (for an indication other than meningitis). The patient's profile has been reviewed for ht/wt/allergies/indication/available labs. A one time order has been placed for the above antibiotics.  Further antibiotics/pharmacy consults should be ordered by admitting physician if indicated.                       Reuel Boom, PharmD, BCPS [phone] 06/22/2018, 3:46 PM

## 2018-06-22 NOTE — ED Notes (Signed)
ED TO INPATIENT HANDOFF REPORT  Name/Age/Gender Kathleen [name] 72 y.o. female  Code Status    Code Status Orders  (From admission, onward)        Start     Ordered   06/22/18 1834  Full code  Continuous     06/22/18 1837    Code Status History    Date Active Date Inactive Code Status Order ID Comments User Context   06/04/2018 1646 06/07/2018 2016 Full Code [id number]  [name], DO Inpatient   12/04/2017 0752 12/06/2017 1738 Full Code [id number]  [name], NP ED   01/23/2015 0608 01/28/2015 1636 Full Code [id number]  [name], MD ED   09/17/2014 1036 09/19/2014 1847 Full Code [id number]  [name], MD Inpatient   11/02/2011 1136 11/04/2011 1452 Full Code [id number]  [name], RN Inpatient      Home/SNF/Other Home  Chief Complaint blood pressure low  Level of Care/Admitting Diagnosis ED Disposition    ED Disposition Condition [facility]: [facility] [[id number]]  Level of Care: Stepdown [14]  Admit to SDU based on following criteria: Hemodynamic compromise or significant risk of instability:  Patient requiring short term acute titration and management of vasoactive drips, and invasive monitoring (i.e., CVP and Arterial line).  Diagnosis: Hypovolemic shock [facility]) [id number]  Admitting Physician: [name] [[id number]]  Attending Physician: [name] [[id number]]  Estimated length of stay: past midnight tomorrow  Certification:: I certify this patient will need inpatient services for at least 2 midnights  PT Class (Do Not Modify): Inpatient [101]  PT Acc Code (Do Not Modify): Private [1]       Medical History Past Medical History:  Diagnosis Date  . Arthritis   . Blood transfusion    no side affects  . CHF (congestive heart failure) ([place])   . Coronary artery disease    Cypher stent to the RCA in 2003  . Diabetes mellitus    Borderline  . GERD (gastroesophageal reflux disease)   .  Hypertension   . Shortness of breath     Allergies Allergies  Allergen Reactions  . Penicillins Hives and Shortness Of Breath    Tolerates Ceftin, Ceftriaxone, and Cefepime Has patient had a PCN reaction causing immediate rash, facial/tongue/throat swelling, SOB or lightheadedness with hypotension: Y Has patient had a PCN reaction causing severe rash involving mucus membranes or skin necrosis: Y Has patient had a PCN reaction that required hospitalization: Y Has patient had a PCN reaction occurring within the last 10 years: N     IV Location/Drains/Wounds Patient Lines/Drains/Airways Status   Active Line/Drains/Airways    Name:   Placement date:   Placement time:   Site:   Days:   Peripheral IV 06/22/18 Right Antecubital   06/22/18    1507    Antecubital   less than 1   Peripheral IV 06/22/18 Left Forearm   06/22/18    1636    Forearm   less than 1          Labs/Imaging Results for orders placed or performed during the hospital encounter of 06/22/18 (from the past 48 hour(s))  CBG monitoring, ED     Status: Abnormal   Collection Time: 06/22/18  2:58 PM  Result Value Ref Range   Glucose-Capillary 138 (H) 70 - 99 mg/dL  Basic metabolic panel     Status: Abnormal   Collection Time: 06/22/18  3:12 PM  Result Value  Ref Range   Sodium 141 135 - 145 mmol/L   Potassium 3.9 3.5 - 5.1 mmol/L   Chloride 102 98 - 111 mmol/L    Comment: Please note change in reference range.   CO2 27 22 - 32 mmol/L   Glucose, Bld 139 (H) 70 - 99 mg/dL    Comment: Please note change in reference range.   BUN 95 (H) 8 - 23 mg/dL    Comment: Please note change in reference range.   Creatinine, Ser 3.36 (H) 0.44 - 1.00 mg/dL   Calcium 10.0 8.9 - 10.3 mg/dL   GFR calc non Af Amer 13 (L) >60 mL/min   GFR calc Af Amer 15 (L) >60 mL/min    Comment: (NOTE) The eGFR has been calculated using the CKD EPI equation. This calculation has not been validated in all clinical situations. eGFR's persistently <60  mL/min signify possible Chronic Kidney Disease.    Anion gap 12 5 - 15    Comment: Performed at [facility] [place] [address] [place], [place] [address]  CBC     Status: Abnormal   Collection Time: 06/22/18  3:12 PM  Result Value Ref Range   WBC 8.7 4.0 - 10.5 K/uL   RBC 3.21 (L) 3.87 - 5.11 MIL/uL   Hemoglobin 8.3 (L) 12.0 - 15.0 g/dL   HCT 26.5 (L) 36.0 - 46.0 %   MCV 82.6 78.0 - 100.0 fL   MCH 25.9 (L) 26.0 - 34.0 pg   MCHC 31.3 30.0 - 36.0 g/dL   RDW 19.3 (H) 11.5 - 15.5 %   Platelets 300 150 - 400 K/uL    Comment: Performed at [facility], [place] [address], [place], [place] [address]  Protime-INR     Status: Abnormal   Collection Time: 06/22/18  3:12 PM  Result Value Ref Range   Prothrombin Time 18.4 (H) 11.4 - 15.2 seconds   INR 1.54     Comment: Performed at [facility], [place] [address] [place], [place] [address]  Lipase, blood     Status: None   Collection Time: 06/22/18  3:12 PM  Result Value Ref Range   Lipase 35 11 - 51 U/L    Comment: Performed at [facility], [place] [address], [place], [place] [address]  Type and screen [place]     Status: None (Preliminary result)   Collection Time: 06/22/18  3:12 PM  Result Value Ref Range   ABO/RH(D) O POS    Antibody Screen NEG    Sample Expiration 06/25/2018    Unit Number [id number]    Blood Component Type RED CELLS,LR    Unit division 00    Status of Unit ISSUED    Transfusion Status OK TO TRANSFUSE    Crossmatch Result      Compatible Performed at [facility], [place] [address], [place], [place] [address]   Hepatic function panel     Status: Abnormal   Collection Time: 06/22/18  3:12 PM  Result Value Ref Range   Total Protein 7.3 6.5 - 8.1 g/dL   Albumin 3.6 3.5 - 5.0 g/dL   AST 61 (H) 15 - 41 U/L   ALT 71 (H) 0 - 44 U/L    Comment: Please note change in reference range.   Alkaline Phosphatase  89 38 - 126 U/L   Total Bilirubin 1.1 0.3 - 1.2 mg/dL   Bilirubin, Direct 0.4 (H) 0.0 - 0.2  mg/dL    Comment: Please note change in reference range.   Indirect Bilirubin 0.7 0.3 - 0.9 mg/dL    Comment: Performed at [facility], [place] [address], [place], [place] [address]  I-Stat CG4 Lactic Acid, ED     Status: None   Collection Time: 06/22/18  3:13 PM  Result Value Ref Range   Lactic Acid, Venous 1.66 0.5 - 1.9 mmol/L  I-Stat Troponin, ED (not at [facility])     Status: None   Collection Time: 06/22/18  3:14 PM  Result Value Ref Range   Troponin i, poc 0.03 0.00 - 0.08 ng/mL   Comment 3            Comment: Due to the release kinetics of cTnI, a negative result within the first hours of the onset of symptoms does not rule out myocardial infarction with certainty. If myocardial infarction is still suspected, repeat the test at appropriate intervals.   I-Stat Chem 8, ED     Status: Abnormal   Collection Time: 06/22/18  3:16 PM  Result Value Ref Range   Sodium 138 135 - 145 mmol/L   Potassium 3.8 3.5 - 5.1 mmol/L   Chloride 99 98 - 111 mmol/L   BUN 87 (H) 8 - 23 mg/dL   Creatinine, Ser 3.40 (H) 0.44 - 1.00 mg/dL   Glucose, Bld 131 (H) 70 - 99 mg/dL   Calcium, Ion 1.14 (L) 1.15 - 1.40 mmol/L   TCO2 25 22 - 32 mmol/L   Hemoglobin 9.9 (L) 12.0 - 15.0 g/dL   HCT 29.0 (L) 36.0 - 46.0 %  Prepare RBC     Status: None   Collection Time: 06/22/18  3:51 PM  Result Value Ref Range   Order Confirmation      ORDER PROCESSED BY BLOOD BANK Performed at [place] [address], [place], [place] [address]    Ct Abdomen Pelvis Wo Contrast  Result Date: 06/22/2018 CLINICAL DATA:  72 y/o  F; mid abdominal pain.  Recent GI bleed. EXAM: CT ABDOMEN AND PELVIS WITHOUT CONTRAST TECHNIQUE: Multidetector CT imaging of the abdomen and pelvis was performed following the standard protocol without IV contrast. COMPARISON:  08/15/2015 CT abdomen and pelvis. FINDINGS:  Lower chest: No acute abnormality. Hepatobiliary: No focal liver abnormality is seen. Status post cholecystectomy. No biliary dilatation. Pancreas: Unremarkable. No pancreatic ductal dilatation or surrounding inflammatory changes. Spleen: Normal in size without focal abnormality. Adrenals/Urinary Tract: Stable right adrenal adenoma and bilateral hypertrophy. Right kidney cyst measuring up to 16 mm cyst. Kidneys are otherwise unremarkable, without renal calculi, focal lesion, or hydronephrosis. Bladder is unremarkable. Stomach/Bowel: Stomach is within normal limits. Appendix appears normal. No evidence of bowel wall thickening, distention, or inflammatory changes. Mild sigmoid diverticulosis without findings of acute diverticulitis. Vascular/Lymphatic: Aortic atherosclerosis. No enlarged abdominal or pelvic lymph nodes. Reproductive: Uterus and bilateral adnexa are unremarkable. Other: Stable large midline ventral hernia containing fat. Musculoskeletal: No fracture is seen. L4-5 and L5-S1 grade 1 anterolisthesis. Lumbar spondylosis with prominent lower lumbar facet arthrosis. IMPRESSION: 1. No acute process identified. 2. Mild sigmoid diverticulosis, no findings of acute diverticulitis. 3. Stable right adrenal adenoma and bilateral hypertrophy. 4. Aortic atherosclerosis. 5. Stable large midline ventral hernia containing fat. 6. Lower lumbar spondylosis with prominent facet arthrosis. Electronically Signed   By: Kristine Garbe M.D.   On: 06/22/2018 16:29   Dg Chest Portable 1 View  Result Date: 06/22/2018 CLINICAL DATA:  Hypotension and dizziness for 5 days.  EXAM: PORTABLE CHEST 1 VIEW COMPARISON:  Single-view of the chest 12/05/2017. FINDINGS: The lungs are clear. Heart size is enlarged. No pneumothorax or pleural effusion. Aortic atherosclerosis noted. No acute bony abnormality. IMPRESSION: Cardiomegaly without acute disease. Atherosclerosis. Electronically Signed   By: Inge Rise M.D.   On:  06/22/2018 15:54    Pending Labs Unresulted Labs (From admission, onward)   Start     Ordered   06/23/18 0539  Basic metabolic panel  Tomorrow morning,   R     06/22/18 1837   06/23/18 0500  CBC  Tomorrow morning,   R     06/22/18 1837   06/22/18 1511  Blood culture (routine x 2)  BLOOD CULTURE X 2,   STAT     06/22/18 1510   06/22/18 1459  Urinalysis, Routine w reflex microscopic  Once,   STAT     06/22/18 1458      Vitals/Pain Today's Vitals   06/22/18 1855 06/22/18 1900 06/22/18 1946 06/22/18 2000  BP: 118/61 110/64 (!) 125/54 92/62  Pulse: 68 75 69 66  Resp: 17 17 (!) 25 (!) 21  Temp:      TempSrc:      SpO2: 98%  97% 99%  Weight:      Height:      PainSc:        Isolation Precautions No active isolations  Medications Medications  0.9 %  sodium chloride infusion (Manually program via Guardrails IV Fluids) (has no administration in time range)  pantoprazole (PROTONIX) 80 mg in sodium chloride 0.9 % 250 mL (0.32 mg/mL) infusion (8 mg/hr Intravenous New Bag/Given 06/22/18 1719)  0.9 %  sodium chloride infusion (has no administration in time range)  albuterol (PROVENTIL) (2.5 MG/3ML) 0.083% nebulizer solution 2.5 mg (has no administration in time range)  allopurinol (ZYLOPRIM) tablet 100 mg (has no administration in time range)  cyclobenzaprine (FLEXERIL) tablet 10 mg (has no administration in time range)  docusate sodium (COLACE) capsule 100 mg (has no administration in time range)  guaiFENesin (MUCINEX) 12 hr tablet 600 mg (has no administration in time range)  pravastatin (PRAVACHOL) tablet 80 mg (has no administration in time range)  mometasone-formoterol (DULERA) 100-5 MCG/ACT inhaler 2 puff (has no administration in time range)  sodium chloride flush (NS) 0.9 % injection 3 mL (has no administration in time range)  acetaminophen (TYLENOL) tablet 650 mg (has no administration in time range)    Or  acetaminophen (TYLENOL) suppository 650 mg (has no administration in  time range)  oxyCODONE (Oxy IR/ROXICODONE) immediate release tablet 5 mg (has no administration in time range)  ondansetron (ZOFRAN) tablet 4 mg (has no administration in time range)    Or  ondansetron (ZOFRAN) injection 4 mg (has no administration in time range)  sodium chloride 0.9 % bolus 1,000 mL (0 mLs Intravenous Stopped 06/22/18 1833)  metroNIDAZOLE (FLAGYL) IVPB 500 mg (0 mg Intravenous Stopped 06/22/18 1638)  ceFEPIme (MAXIPIME) 2 g in sodium chloride 0.9 % 100 mL IVPB (0 g Intravenous Stopped 06/22/18 1718)  pantoprazole (PROTONIX) 80 mg in sodium chloride 0.9 % 100 mL IVPB (0 mg Intravenous Stopped 06/22/18 1711)    Mobility non-ambulatory

## 2018-06-22 NOTE — ED Notes (Signed)
164ML Bladder Scan

## 2018-06-22 NOTE — ED Notes (Signed)
Pt has these blood tubes sent to main lab: 2-gold, pink, blue, light green, purple, 1 set-Blood cultures, grey and dark green on ice.

## 2018-06-22 NOTE — ED Provider Notes (Signed)
[facility] DEPT Provider Note   CSN: [id number] Arrival date & time: 06/22/18  1449     History   Chief Complaint Chief Complaint  Patient presents with  . Hypotension    HPI Kathleen [name] is a 72 y.o. female.  HPI 72 year old female with history of CHF, coronary disease, A. fib on Eliquis, recent GI bleed, here with hypotension and generalized weakness.  The patient states that since her recent discharge, she has been persistently weak.  Over the last 4 to 5 days, she become increasingly weak and drowsy.  She said difficulty getting around her house due to extreme fatigue.  Her family came to visit her today and noticed that she was "out of it" with slurred speech, falling asleep easily.  She is unable to stand up due to dizziness.  She is complaining of abdominal pain.  She has reportedly not been eating and drinking much since her recent EGD.  She denies any chest pain.  She does endorse intermittent diffuse abdominal cramping that seems to come and go.  No pain currently.  She is had persistent nausea but no vomiting.  She states her stool continues to be black.  Denies any known fevers or chills.  No chest pain.  Past Medical History:  Diagnosis Date  . Arthritis   . Blood transfusion    no side affects  . CHF (congestive heart failure) ([place])   . Coronary artery disease    Cypher stent to the RCA in 2003  . Diabetes mellitus    Borderline  . GERD (gastroesophageal reflux disease)   . Hypertension   . Shortness of breath     Patient Active Problem List   Diagnosis Date Noted  . Hypotension due to hypovolemia 06/22/2018  . Acute metabolic encephalopathy 42/70/6237  . Acute on chronic renal failure ([place]) 06/22/2018  . Transaminitis 06/22/2018  . Anemia due to GI blood loss 06/22/2018  . Epigastric pain 06/22/2018  . Decreased oral intake 06/22/2018  . Hypovolemic shock ([place]) 06/22/2018  . Duodenitis 06/07/2018  . Chronic gastritis  06/07/2018  . Abnormal LFTs   . Upper GI bleed   . Melena 06/04/2018  . Atrial fibrillation, chronic ([place]) 06/04/2018  . SOB (shortness of breath) 03/08/2018  . SIRS (systemic inflammatory response syndrome) ([place]) 12/04/2017  . Hypothermia 12/04/2017  . Elevated LFTs 12/04/2017  . Sepsis ([place]) 12/04/2017  . Atrial flutter ([place]) 03/17/2015  . NSTEMI (non-ST elevated myocardial infarction) ([place])   . Chronic diastolic CHF (congestive heart failure) ([place])   . Type 2 diabetes mellitus with hypoglycemia without coma ([place])   . Paroxysmal atrial fibrillation ([place]) 01/25/2015  . Non-ST elevation MI (NSTEMI) ([place]) 01/23/2015  . Diabetes mellitus type 2, controlled ([place]) 01/23/2015  . HLD (hyperlipidemia)   . CKD (chronic kidney disease) stage 4, GFR 15-29 ml/min ([facility])   . Acute congestive heart failure ([place]) 09/17/2014  . Postmenopausal bleeding 01/10/2014  . Carotid stenosis 01/04/2012  . Carpal tunnel syndrome on left 11/26/2011  . Transient ischemic attack on medication 11/02/2011  . Diabetes mellitus ([place]) 11/02/2011  . Hyperlipidemia with target LDL less than 70 09/17/2008  . OBESITY 09/17/2008  . Essential hypertension 09/17/2008  . Coronary atherosclerosis 09/17/2008    Past Surgical History:  Procedure Laterality Date  . BIOPSY  06/06/2018   Procedure: BIOPSY;  Surgeon: Otis Brace, MD;  Location: [facility];  Service: Gastroenterology;;  . CARDIOVERSION N/A 03/17/2015   Procedure: CARDIOVERSION;  Surgeon: Sueanne Margarita,  MD;  Location: [facility];  Service: Cardiovascular;  Laterality: N/A;  . CHOLECYSTECTOMY    . CORONARY ANGIOPLASTY WITH STENT PLACEMENT    . ESOPHAGOGASTRODUODENOSCOPY (EGD) WITH PROPOFOL N/A 06/06/2018   Procedure: ESOPHAGOGASTRODUODENOSCOPY (EGD) WITH PROPOFOL;  Surgeon: Otis Brace, MD;  Location: [facility];  Service: Gastroenterology;  Laterality: N/A;  . HOT HEMOSTASIS N/A 06/06/2018   Procedure: HOT HEMOSTASIS (ARGON PLASMA  COAGULATION/BICAP);  Surgeon: Otis Brace, MD;  Location: [facility];  Service: Gastroenterology;  Laterality: N/A;  . TOTAL KNEE ARTHROPLASTY       OB History    Gravida  9   Para  7   Term  5   Preterm  2   AB  2   Living  5     SAB  2   TAB  0   Ectopic  0   Multiple  0   Live Births  7            Home Medications    Prior to Admission medications   Medication Sig Start Date End Date Taking? Authorizing Provider  albuterol (PROVENTIL HFA;VENTOLIN HFA) 108 (90 Base) MCG/ACT inhaler Inhale 1-2 puffs into the lungs every 4 (four) hours as needed for wheezing or shortness of breath. 12/06/17  Yes Mariel Aloe, MD  albuterol (PROVENTIL) (2.5 MG/3ML) 0.083% nebulizer solution Take 2.5 mg by nebulization every 6 (six) hours as needed for wheezing or shortness of breath.   Yes [provider]  allopurinol (ZYLOPRIM) 100 MG tablet Take 100 mg by mouth daily.   Yes [provider]  ELIQUIS 5 MG TABS tablet take 1 tablet by mouth twice a day 07/13/16  Yes Minus Breeding, MD  famotidine (PEPCID) 20 MG tablet Take 20 mg by mouth at bedtime.  02/04/18  Yes [provider]  furosemide (LASIX) 80 MG tablet Take 1 tablet (80 mg total) by mouth 2 (two) times daily. 06/10/18  Yes Eugenie Filler, MD  isosorbide-hydrALAZINE (BIDIL) 20-37.5 MG tablet Take 1 tablet by mouth 3 (three) times daily. 06/10/18  Yes Eugenie Filler, MD  LANTUS SOLOSTAR 100 UNIT/ML Solostar Pen INJ 10 UNITS [place] QAM 03/17/18  Yes [provider]  metoprolol tartrate (LOPRESSOR) 25 MG tablet TAKE 3 TABLETS BY MOUTH TWICE A DAY Patient taking differently: TAKE 1 TABLET BY MOUTH TWICE A DAY 02/06/18  Yes Minus Breeding, MD  pantoprazole (PROTONIX) 40 MG tablet Take 1 tablet (40 mg total) by mouth daily. 06/07/18 06/07/19 Yes Eugenie Filler, MD  pravastatin (PRAVACHOL) 80 MG tablet Take 80 mg by mouth daily.   Yes [provider]  SYMBICORT 80-4.5 MCG/ACT  inhaler Inhale 2 puffs into the lungs 2 (two) times daily. 02/02/18  Yes [provider]  COLCRYS 0.6 MG tablet Take 0.6 mg by mouth daily as needed (gout).  02/04/18   [provider]  cyclobenzaprine (FLEXERIL) 10 MG tablet Take 10 mg by mouth 3 (three) times daily as needed for muscle spasms.    [provider]  docusate sodium (COLACE) 100 MG capsule Take 100 mg by mouth 2 (two) times daily as needed for mild constipation.    [provider]  guaiFENesin (MUCINEX) 600 MG 12 hr tablet Take 600 mg by mouth 2 (two) times daily as needed (congestion).    [provider]  pravastatin (PRAVACHOL) 80 MG tablet Take 1 tablet (80 mg total) by mouth daily. 11/23/11 03/08/18  Minus Breeding, MD    Family History Family History  Problem Relation Age of Onset  . Hypertension Father   . Heart disease Father   . Gout Father   . Arthritis Father   . Heart attack Father   . Heart attack Brother   . Hypertension Brother   . Hypertension Sister   . Stroke Neg Hx     Social History Social History   Tobacco Use  . Smoking status: Former Smoker    Packs/day: 0.30    Years: 30.00    Pack years: 9.00    Types: Cigarettes    Last attempt to quit: 01/18/2014    Years since quitting: 4.4  . Smokeless tobacco: Never Used  . Tobacco comment: Smoking cessation requested   Substance Use Topics  . Alcohol use: No  . Drug use: No     Allergies   Penicillins   Review of Systems Review of Systems  Constitutional: Positive for fatigue. Negative for chills and fever.  HENT: Negative for congestion and rhinorrhea.   Eyes: Negative for visual disturbance.  Respiratory: Negative for cough, shortness of breath and wheezing.   Cardiovascular: Negative for chest pain and leg swelling.  Gastrointestinal: Positive for abdominal pain, blood in stool and nausea. Negative for diarrhea and vomiting.  Genitourinary: Negative for dysuria and flank pain.    Musculoskeletal: Negative for neck pain and neck stiffness.  Skin: Negative for rash and wound.  Allergic/Immunologic: Negative for immunocompromised state.  Neurological: Positive for weakness and light-headedness. Negative for syncope and headaches.  All other systems reviewed and are negative.    Physical Exam Updated Vital Signs BP (!) 113/49   Pulse 65   Temp (!) 94.6 F (34.8 C) (Axillary)   Resp (!) 27   Ht 5\' 4"  (1.626 m)   Wt 90.7 kg (200 lb)   SpO2 98%   BMI 34.33 kg/m   Physical Exam  Constitutional: She is oriented to person, place, and time. She appears well-developed and well-nourished. She appears distressed.  Ill-appearing, fatigued  HENT:  Head: Normocephalic and atraumatic.  Mouth/Throat: Oropharynx is clear and moist.  Eyes: Conjunctivae are normal.  Neck: Neck supple.  Cardiovascular: Normal rate, regular rhythm and normal heart sounds. Exam reveals no friction rub.  No murmur heard. Pulmonary/Chest: Effort normal and breath sounds normal. No respiratory distress. She has no wheezes. She has no rales.  Abdominal: She exhibits no distension. There is tenderness. There is guarding. There is no rebound.  Musculoskeletal: She exhibits no edema.  Neurological: She is alert and oriented to person, place, and time. She exhibits normal muscle tone.  Skin: Skin is warm. Capillary refill takes less than 2 seconds.  Psychiatric: She has a normal mood and affect.  Nursing note and vitals reviewed.    ED Treatments / Results  Labs (all labs ordered are listed, but only abnormal results are displayed) Labs Reviewed  BASIC METABOLIC PANEL - Abnormal; Notable for the following components:      Result Value   Glucose, Bld 139 (*)    BUN 95 (*)    Creatinine, Ser 3.36 (*)    GFR calc non Af Amer 13 (*)    GFR calc Af Amer 15 (*)    All other components within normal limits  CBC - Abnormal; Notable for the following components:   RBC 3.21 (*)    Hemoglobin  8.3 (*)    HCT 26.5 (*)    MCH 25.9 (*)    RDW 19.3 (*)    All other components within normal  limits  PROTIME-INR - Abnormal; Notable for the following components:   Prothrombin Time 18.4 (*)    All other components within normal limits  HEPATIC FUNCTION PANEL - Abnormal; Notable for the following components:   AST 61 (*)    ALT 71 (*)    Bilirubin, Direct 0.4 (*)    All other components within normal limits  CBG MONITORING, ED - Abnormal; Notable for the following components:   Glucose-Capillary 138 (*)    All other components within normal limits  I-STAT CHEM 8, ED - Abnormal; Notable for the following components:   BUN 87 (*)    Creatinine, Ser 3.40 (*)    Glucose, Bld 131 (*)    Calcium, Ion 1.14 (*)    Hemoglobin 9.9 (*)    HCT 29.0 (*)    All other components within normal limits  MRSA PCR SCREENING  CULTURE, BLOOD (ROUTINE X 2)  CULTURE, BLOOD (ROUTINE X 2)  LIPASE, BLOOD  URINALYSIS, ROUTINE W REFLEX MICROSCOPIC  BASIC METABOLIC PANEL  CBC  CBG MONITORING, ED  I-STAT CG4 LACTIC ACID, ED  I-STAT TROPONIN, ED  POC OCCULT BLOOD, ED  TYPE AND SCREEN  PREPARE RBC (CROSSMATCH)    EKG EKG Interpretation  Date/Time:  Thursday June 22 2018 15:05:05 EDT Ventricular Rate:  79 PR Interval:    QRS Duration: 95 QT Interval:  432 QTC Calculation: 496 R Axis:   39 Text Interpretation:  Atrial fibrillation Anterior infarct, old No significant change since last tracing Confirmed by Duffy Bruce [id number] on 06/22/2018 3:15:18 PM   Radiology Ct Abdomen Pelvis Wo Contrast  Result Date: 06/22/2018 CLINICAL DATA:  72 y/o  F; mid abdominal pain.  Recent GI bleed. EXAM: CT ABDOMEN AND PELVIS WITHOUT CONTRAST TECHNIQUE: Multidetector CT imaging of the abdomen and pelvis was performed following the standard protocol without IV contrast. COMPARISON:  08/15/2015 CT abdomen and pelvis. FINDINGS: Lower chest: No acute abnormality. Hepatobiliary: No focal liver abnormality is seen.  Status post cholecystectomy. No biliary dilatation. Pancreas: Unremarkable. No pancreatic ductal dilatation or surrounding inflammatory changes. Spleen: Normal in size without focal abnormality. Adrenals/Urinary Tract: Stable right adrenal adenoma and bilateral hypertrophy. Right kidney cyst measuring up to 16 mm cyst. Kidneys are otherwise unremarkable, without renal calculi, focal lesion, or hydronephrosis. Bladder is unremarkable. Stomach/Bowel: Stomach is within normal limits. Appendix appears normal. No evidence of bowel wall thickening, distention, or inflammatory changes. Mild sigmoid diverticulosis without findings of acute diverticulitis. Vascular/Lymphatic: Aortic atherosclerosis. No enlarged abdominal or pelvic lymph nodes. Reproductive: Uterus and bilateral adnexa are unremarkable. Other: Stable large midline ventral hernia containing fat. Musculoskeletal: No fracture is seen. L4-5 and L5-S1 grade 1 anterolisthesis. Lumbar spondylosis with prominent lower lumbar facet arthrosis. IMPRESSION: 1. No acute process identified. 2. Mild sigmoid diverticulosis, no findings of acute diverticulitis. 3. Stable right adrenal adenoma and bilateral hypertrophy. 4. Aortic atherosclerosis. 5. Stable large midline ventral hernia containing fat. 6. Lower lumbar spondylosis with prominent facet arthrosis. Electronically Signed   By: Kristine Garbe M.D.   On: 06/22/2018 16:29   Dg Chest Portable 1 View  Result Date: 06/22/2018 CLINICAL DATA:  Hypotension and dizziness for 5 days. EXAM: PORTABLE CHEST 1 VIEW COMPARISON:  Single-view of the chest 12/05/2017. FINDINGS: The lungs are clear. Heart size is enlarged. No pneumothorax or pleural effusion. Aortic atherosclerosis noted. No acute bony abnormality. IMPRESSION: Cardiomegaly without acute disease. Atherosclerosis. Electronically Signed   By: Inge Rise M.D.   On: 06/22/2018 15:54    Procedures .Critical Care  Performed by: Duffy Bruce,  MD Authorized by: Duffy Bruce, MD   Critical care provider statement:    Critical care time (minutes):  35   Critical care time was exclusive of:  Separately billable procedures and treating other patients and teaching time   Critical care was necessary to treat or prevent imminent or life-threatening deterioration of the following conditions:  Circulatory failure, cardiac failure and dehydration   Critical care was time spent personally by me on the following activities:  Development of treatment plan with patient or surrogate, discussions with consultants, evaluation of patient's response to treatment, examination of patient, obtaining history from patient or surrogate, ordering and performing treatments and interventions, ordering and review of laboratory studies, ordering and review of radiographic studies, pulse oximetry, re-evaluation of patient's condition and review of old charts   I assumed direction of critical care for this patient from another provider in my specialty: no     (including critical care time)  Medications Ordered in ED Medications  0.9 %  sodium chloride infusion (Manually program via Guardrails IV Fluids) (has no administration in time range)  pantoprazole (PROTONIX) 80 mg in sodium chloride 0.9 % 250 mL (0.32 mg/mL) infusion (8 mg/hr Intravenous New Bag/Given 06/22/18 1719)  0.9 %  sodium chloride infusion (has no administration in time range)  albuterol (PROVENTIL) (2.5 MG/3ML) 0.083% nebulizer solution 2.5 mg (has no administration in time range)  allopurinol (ZYLOPRIM) tablet 100 mg (has no administration in time range)  cyclobenzaprine (FLEXERIL) tablet 10 mg (has no administration in time range)  docusate sodium (COLACE) capsule 100 mg (has no administration in time range)  guaiFENesin (MUCINEX) 12 hr tablet 600 mg (has no administration in time range)  pravastatin (PRAVACHOL) tablet 80 mg (has no administration in time range)  mometasone-formoterol (DULERA)  100-5 MCG/ACT inhaler 2 puff (2 puffs Inhalation Given 06/22/18 2305)  sodium chloride flush (NS) 0.9 % injection 3 mL (3 mLs Intravenous Given 06/22/18 2112)  acetaminophen (TYLENOL) tablet 650 mg (has no administration in time range)    Or  acetaminophen (TYLENOL) suppository 650 mg (has no administration in time range)  oxyCODONE (Oxy IR/ROXICODONE) immediate release tablet 5 mg (has no administration in time range)  ondansetron (ZOFRAN) tablet 4 mg (has no administration in time range)    Or  ondansetron (ZOFRAN) injection 4 mg (has no administration in time range)  sodium chloride 0.9 % bolus 1,000 mL (0 mLs Intravenous Stopped 06/22/18 1833)  metroNIDAZOLE (FLAGYL) IVPB 500 mg (0 mg Intravenous Stopped 06/22/18 1638)  ceFEPIme (MAXIPIME) 2 g in sodium chloride 0.9 % 100 mL IVPB (0 g Intravenous Stopped 06/22/18 1718)  pantoprazole (PROTONIX) 80 mg in sodium chloride 0.9 % 100 mL IVPB (0 mg Intravenous Stopped 06/22/18 1711)     Initial Impression / Assessment and Plan / ED Course  I have reviewed the triage vital signs and the nursing notes.  Pertinent labs & imaging results that were available during my care of the patient were reviewed by me and considered in my medical decision making (see chart for details).  Clinical Course as of Jun 23 2331  Thu Jun 22, 2018  1514 ED EKG 12-Lead [CI]  3476 72 year old female here with abdominal pain, nausea, and profound hypotension.  Concern for sepsis secondary to intra-abdominal source, recurrent GI bleed, profound dehydration.  No active bright red blood per rectum or vomiting of blood.  Will start cautious fluids given her history of CHF, empiric antibiotics of cefepime and Flagyl given her  penicillin allergy, and obtain upright plain film.  Will likely need CT imaging of the abdomen pending lab results.   [CI]  1602 Initial labs show hemoglobin of 8.3, down from over 9 just recently.  Patient also with BUN of 87 and creatinine of 3.4.  Given  market elevation in her BUN above her baseline and her clinical history, concern for recurrent upper GI bleed.  Will place on PPI, discussed with GI.  Otherwise, IV fluids given.  Patient has history of heart failure with preserved EF, so will be cautious with fluids.   [CI]  1744 D/w Dr. Hilarie Fredrickson. Will admit to step down with medicine.   [CI]    Clinical Course User Index [CI] Duffy Bruce, MD     Final Clinical Impressions(s) / ED Diagnoses   Final diagnoses:  UGIB (upper gastrointestinal bleed)  AKI (acute kidney injury) [facility])  Dehydration    ED Discharge Orders    None       Duffy Bruce, MD 06/22/18 2333

## 2018-06-22 NOTE — ED Notes (Signed)
Pt stated that she was light headed just laying down and did not feel comfortable standing up to do orthostatic vitals

## 2018-06-22 NOTE — ED Notes (Signed)
Patient transported to X-ray 

## 2018-06-23 DIAGNOSIS — I482 Chronic atrial fibrillation: Secondary | ICD-10-CM

## 2018-06-23 DIAGNOSIS — N184 Chronic kidney disease, stage 4 (severe): Secondary | ICD-10-CM

## 2018-06-23 DIAGNOSIS — K921 Melena: Secondary | ICD-10-CM

## 2018-06-23 DIAGNOSIS — I5032 Chronic diastolic (congestive) heart failure: Secondary | ICD-10-CM

## 2018-06-23 DIAGNOSIS — E860 Dehydration: Secondary | ICD-10-CM

## 2018-06-23 DIAGNOSIS — R740 Nonspecific elevation of levels of transaminase and lactic acid dehydrogenase [LDH]: Secondary | ICD-10-CM

## 2018-06-23 DIAGNOSIS — D500 Iron deficiency anemia secondary to blood loss (chronic): Secondary | ICD-10-CM

## 2018-06-23 DIAGNOSIS — D649 Anemia, unspecified: Secondary | ICD-10-CM

## 2018-06-23 DIAGNOSIS — E1122 Type 2 diabetes mellitus with diabetic chronic kidney disease: Secondary | ICD-10-CM

## 2018-06-23 DIAGNOSIS — N179 Acute kidney failure, unspecified: Secondary | ICD-10-CM

## 2018-06-23 DIAGNOSIS — K5900 Constipation, unspecified: Secondary | ICD-10-CM

## 2018-06-23 LAB — BASIC METABOLIC PANEL
ANION GAP: 10 (ref 5–15)
BUN: 91 mg/dL — ABNORMAL HIGH (ref 8–23)
CALCIUM: 9.3 mg/dL (ref 8.9–10.3)
CO2: 26 mmol/L (ref 22–32)
CREATININE: 2.89 mg/dL — AB (ref 0.44–1.00)
Chloride: 105 mmol/L (ref 98–111)
GFR calc Af Amer: 18 mL/min — ABNORMAL LOW (ref >60)
GFR calc non Af Amer: 15 mL/min — ABNORMAL LOW (ref >60)
GLUCOSE: 97 mg/dL (ref 70–99)
Potassium: 3.5 mmol/L (ref 3.5–5.1)
Sodium: 141 mmol/L (ref 135–145)

## 2018-06-23 LAB — CBC
HCT: 28.1 % — ABNORMAL LOW (ref 36.0–46.0)
HCT: 29 % — ABNORMAL LOW (ref 36.0–46.0)
HEMATOCRIT: 27.8 % — AB (ref 36.0–46.0)
HEMOGLOBIN: 8.9 g/dL — AB (ref 12.0–15.0)
HEMOGLOBIN: 9.1 g/dL — AB (ref 12.0–15.0)
HEMOGLOBIN: 8.8 g/dL — AB (ref 12.0–15.0)
MCH: 26 pg (ref 26.0–34.0)
MCH: 26 pg (ref 26.0–34.0)
MCH: 26.3 pg (ref 26.0–34.0)
MCHC: 31.7 g/dL (ref 30.0–36.0)
MCHC: 31.4 g/dL (ref 30.0–36.0)
MCHC: 31.7 g/dL (ref 30.0–36.0)
MCV: 82.2 fL (ref 78.0–100.0)
MCV: 82.9 fL (ref 78.0–100.0)
MCV: 83 fL (ref 78.0–100.0)
PLATELETS: 277 10*3/uL (ref 150–400)
Platelets: 257 10*3/uL (ref 150–400)
Platelets: 251 10*3/uL (ref 150–400)
RBC: 3.42 MIL/uL — ABNORMAL LOW (ref 3.87–5.11)
RBC: 3.5 MIL/uL — AB (ref 3.87–5.11)
RBC: 3.35 MIL/uL — ABNORMAL LOW (ref 3.87–5.11)
RDW: 18.8 % — ABNORMAL HIGH (ref 11.5–15.5)
RDW: 19 % — ABNORMAL HIGH (ref 11.5–15.5)
RDW: 19 % — AB (ref 11.5–15.5)
WBC: 7.2 10*3/uL (ref 4.0–10.5)
WBC: 8.2 10*3/uL (ref 4.0–10.5)
WBC: 7.3 10*3/uL (ref 4.0–10.5)

## 2018-06-23 LAB — URINALYSIS, ROUTINE W REFLEX MICROSCOPIC
BILIRUBIN URINE: NEGATIVE
Glucose, UA: NEGATIVE mg/dL
HGB URINE DIPSTICK: NEGATIVE
KETONES UR: NEGATIVE mg/dL
NITRITE: NEGATIVE
PH: 5 (ref 5.0–8.0)
Protein, ur: NEGATIVE mg/dL
Specific Gravity, Urine: 1.009 (ref 1.005–1.030)

## 2018-06-23 LAB — RETICULOCYTES
RBC.: 3.54 MIL/uL — AB (ref 3.87–5.11)
Retic Count, Absolute: 70.8 10*3/uL (ref 19.0–186.0)
Retic Ct Pct: 2 % (ref 0.4–3.1)

## 2018-06-23 LAB — VITAMIN B12: VITAMIN B 12: 731 pg/mL (ref 180–914)

## 2018-06-23 LAB — IRON AND TIBC
Iron: 43 ug/dL (ref 28–170)
Saturation Ratios: 8 % — ABNORMAL LOW (ref 10.4–31.8)
TIBC: 553 ug/dL — ABNORMAL HIGH (ref 250–450)
UIBC: 510 ug/dL

## 2018-06-23 LAB — FOLATE: FOLATE: 10.8 ng/mL (ref >5.9)

## 2018-06-23 LAB — GLUCOSE, CAPILLARY
GLUCOSE-CAPILLARY: 117 mg/dL — AB (ref 70–99)
Glucose-Capillary: 80 mg/dL (ref 70–99)
Glucose-Capillary: 85 mg/dL (ref 70–99)

## 2018-06-23 LAB — BPAM RBC
Blood Product Expiration Date: 201908112359
ISSUE DATE / TIME: 201907111648
Unit Type and Rh: 5100

## 2018-06-23 LAB — TYPE AND SCREEN
ABO/RH(D): O POS
ANTIBODY SCREEN: NEGATIVE
Unit division: 0

## 2018-06-23 LAB — FERRITIN: Ferritin: 21 ng/mL (ref 11–307)

## 2018-06-23 LAB — OCCULT BLOOD X 1 CARD TO LAB, STOOL: Fecal Occult Bld: POSITIVE — AB

## 2018-06-23 MED ORDER — INSULIN ASPART 100 UNIT/ML ~~LOC~~ SOLN
0.0000 [IU] | Freq: Three times a day (TID) | SUBCUTANEOUS | Status: DC
  Administered 2018-06-26 (×3): 1 [IU] via SUBCUTANEOUS
  Administered 2018-06-29: 3 [IU] via SUBCUTANEOUS

## 2018-06-23 MED ORDER — METOPROLOL TARTRATE 25 MG PO TABS
25.0000 mg | ORAL_TABLET | Freq: Two times a day (BID) | ORAL | Status: DC
  Administered 2018-06-23 – 2018-06-24 (×4): 25 mg via ORAL
  Filled 2018-06-23 (×4): qty 1

## 2018-06-23 MED ORDER — BISACODYL 10 MG RE SUPP
10.0000 mg | Freq: Once | RECTAL | Status: DC
  Filled 2018-06-23: qty 1

## 2018-06-23 MED ORDER — SODIUM CHLORIDE 0.9 % IV SOLN
INTRAVENOUS | Status: DC
  Administered 2018-06-23 – 2018-06-26 (×3): via INTRAVENOUS

## 2018-06-23 MED ORDER — POLYETHYLENE GLYCOL 3350 17 G PO PACK
17.0000 g | PACK | Freq: Every day | ORAL | Status: DC
  Administered 2018-06-23 – 2018-06-27 (×2): 17 g via ORAL
  Filled 2018-06-23 (×4): qty 1

## 2018-06-23 MED ORDER — INSULIN ASPART 100 UNIT/ML ~~LOC~~ SOLN: 0.0000 [IU] | Freq: Every day | SUBCUTANEOUS | Status: DC

## 2018-06-23 NOTE — Progress Notes (Signed)
PROGRESS NOTE    Kathleen [name]   UDJ:[id number]  DOB: December 01, 1946  DOA: 06/22/2018 PCP: Kathleen Ebbs, MD   Brief Narrative:  Kathleen [name] is a 72 y/o female with has h/o A-fib on Eliquis, d CHF on Lasix, CAD/NSTEMI, DM2, CKD4 and chronic transaminitis who was recetnly admitted with melena and underwent an ablation of an AVM.  After discharge, she states, she continued to have black stools and presented back to the hospital for dizziness. She was found to have AKI and hypotention with SBP in 60s. She was given IVF and transfused 1 U PRBC. She was started on a Protonix infusion and admitted. GI was consulted.    Subjective: No stool today, dizziness has resolved. No nausea or vomiting. No other complaints.     Assessment & Plan:   Principal Problem:   Hypotension due to hypovolemia - IVF and PRBC given with improvement in BP and symptoms - cont to follow in SDU today  Active Problems:    Acute on chronic renal failure - due to hypovolemia in setting of GI bleed and Lasix- cont slow IVF and follow Cr    Anemia due to GI blood loss/   Upper GI bleed - Hold Eliquis, clear liquids for now - GI will scope again due to ongoing upper GU bleed - given 1 U PRBC on 7/11- follow Hb every 12 hrs    Diabetes mellitus  - Lantus is on hold - place on sSI    Chronic diastolic CHF   - holding Lasix- give slow IVF to prevent fluid overload    Atrial fibrillation, chronic  - cont to hold Eliquis - she is on Metoprolol 75 mg BID- I will resume this at 25 mg BID for now and follow HR   Transaminitis - this is chronic and mild- GI aware    DVT prophylaxis: SCDs Code Status: Full code Family Communication:  Disposition Plan: cont to follow renal function and Hb Consultants:   GI Procedures:   none Antimicrobials:  Anti-infectives (From admission, onward)   Start     Dose/Rate Route Frequency Ordered Stop   06/22/18 1600  ceFEPIme (MAXIPIME) 2 g in sodium chloride 0.9 %  100 mL IVPB     2 g 200 mL/hr over 30 Minutes Intravenous  Once 06/22/18 1545 06/22/18 1718   06/22/18 1515  metroNIDAZOLE (FLAGYL) IVPB 500 mg     500 mg 100 mL/hr over 60 Minutes Intravenous  Once 06/22/18 1513 06/22/18 1638       Objective: Vitals:   06/23/18 0600 06/23/18 0700 06/23/18 0800 06/23/18 0900  BP: 110/72 (!) 133/44 (!) 150/68 (!) 147/52  Pulse: 80 70 100 70  Resp: 15 18 (!) 21 18  Temp:   98.2 F (36.8 C)   TempSrc:   Oral   SpO2: 99% 99% 98% 99%  Weight:      Height:        Intake/Output Summary (Last 24 hours) at 06/23/2018 1104 Last data filed at 06/22/2018 1833 Gross per 24 hour  Intake 1308.33 ml  Output -  Net 1308.33 ml   Filed Weights   06/22/18 1454  Weight: 90.7 kg (200 lb)    Examination: General exam: Appears comfortable  HEENT: PERRLA, oral mucosa moist, no sclera icterus or thrush Respiratory system: Clear to auscultation. Respiratory effort normal. Cardiovascular system: S1 & S2 heard, RRR.   Gastrointestinal system: Abdomen soft, non-tender, nondistended. Normal bowel sound. No organomegaly Central nervous system: Alert and oriented.  No focal neurological deficits. Extremities: No cyanosis, clubbing or edema Skin: No rashes or ulcers Psychiatry:  Mood & affect appropriate.     Data Reviewed: I have personally reviewed following labs and imaging studies  CBC: Recent Labs  Lab 06/22/18 1512 06/22/18 1516 06/23/18 0308  WBC 8.7  --  7.2  HGB 8.3* 9.9* 8.9*  HCT 26.5* 29.0* 28.1*  MCV 82.6  --  82.2  PLT 300  --  086   Basic Metabolic Panel: Recent Labs  Lab 06/22/18 1512 06/22/18 1516 06/23/18 0308  NA 141 138 141  K 3.9 3.8 3.5  CL 102 99 105  CO2 27  --  26  GLUCOSE 139* 131* 97  BUN 95* 87* 91*  CREATININE 3.36* 3.40* 2.89*  CALCIUM 10.0  --  9.3   GFR: Estimated Creatinine Clearance: 18.9 mL/min (A) (by C-G formula based on SCr of 2.89 mg/dL (H)). Liver Function Tests: Recent Labs  Lab 06/22/18 1512    AST 61*  ALT 71*  ALKPHOS 89  BILITOT 1.1  PROT 7.3  ALBUMIN 3.6   Recent Labs  Lab 06/22/18 1512  LIPASE 35   No results for input(s): AMMONIA in the last 168 hours. Coagulation Profile: Recent Labs  Lab 06/22/18 1512  INR 1.54   Cardiac Enzymes: No results for input(s): CKTOTAL, CKMB, CKMBINDEX, TROPONINI in the last 168 hours. BNP (last 3 results) No results for input(s): PROBNP in the last 8760 hours. HbA1C: No results for input(s): HGBA1C in the last 72 hours. CBG: Recent Labs  Lab 06/22/18 1458  GLUCAP 138*   Lipid Profile: No results for input(s): CHOL, HDL, LDLCALC, TRIG, CHOLHDL, LDLDIRECT in the last 72 hours. Thyroid Function Tests: No results for input(s): TSH, T4TOTAL, FREET4, T3FREE, THYROIDAB in the last 72 hours. Anemia Panel: No results for input(s): VITAMINB12, FOLATE, FERRITIN, TIBC, IRON, RETICCTPCT in the last 72 hours. Urine analysis:    Component Value Date/Time   COLORURINE YELLOW 06/04/2018 0923   APPEARANCEUR CLEAR 06/04/2018 0923   LABSPEC 1.011 06/04/2018 0923   PHURINE 5.0 06/04/2018 0923   GLUCOSEU NEGATIVE 06/04/2018 0923   HGBUR SMALL (A) 06/04/2018 0923   BILIRUBINUR NEGATIVE 06/04/2018 0923   KETONESUR NEGATIVE 06/04/2018 0923   PROTEINUR NEGATIVE 06/04/2018 0923   UROBILINOGEN 1.0 08/15/2015 0915   NITRITE NEGATIVE 06/04/2018 0923   LEUKOCYTESUR NEGATIVE 06/04/2018 0923   Sepsis Labs: @LABRCNTIP (procalcitonin:4,lacticidven:4) ) Recent Results (from the past 240 hour(s))  MRSA PCR Screening     Status: None   Collection Time: 06/22/18  9:09 PM  Result Value Ref Range Status   MRSA by PCR NEGATIVE NEGATIVE Final    Comment:        The GeneXpert MRSA Assay (FDA approved for NASAL specimens only), is one component of a comprehensive MRSA colonization surveillance program. It is not intended to diagnose MRSA infection nor to guide or monitor treatment for MRSA infections. Performed at [facility], [place] [address] [place], [place] [address]          Radiology Studies: Ct Abdomen Pelvis Wo Contrast  Result Date: 06/22/2018 CLINICAL DATA:  72 y/o  F; mid abdominal pain.  Recent GI bleed. EXAM: CT ABDOMEN AND PELVIS WITHOUT CONTRAST TECHNIQUE: Multidetector CT imaging of the abdomen and pelvis was performed following the standard protocol without IV contrast. COMPARISON:  08/15/2015 CT abdomen and pelvis. FINDINGS: Lower chest: No acute abnormality. Hepatobiliary: No focal liver abnormality is seen. Status post cholecystectomy. No biliary dilatation. Pancreas: Unremarkable.  No pancreatic ductal dilatation or surrounding inflammatory changes. Spleen: Normal in size without focal abnormality. Adrenals/Urinary Tract: Stable right adrenal adenoma and bilateral hypertrophy. Right kidney cyst measuring up to 16 mm cyst. Kidneys are otherwise unremarkable, without renal calculi, focal lesion, or hydronephrosis. Bladder is unremarkable. Stomach/Bowel: Stomach is within normal limits. Appendix appears normal. No evidence of bowel wall thickening, distention, or inflammatory changes. Mild sigmoid diverticulosis without findings of acute diverticulitis. Vascular/Lymphatic: Aortic atherosclerosis. No enlarged abdominal or pelvic lymph nodes. Reproductive: Uterus and bilateral adnexa are unremarkable. Other: Stable large midline ventral hernia containing fat. Musculoskeletal: No fracture is seen. L4-5 and L5-S1 grade 1 anterolisthesis. Lumbar spondylosis with prominent lower lumbar facet arthrosis. IMPRESSION: 1. No acute process identified. 2. Mild sigmoid diverticulosis, no findings of acute diverticulitis. 3. Stable right adrenal adenoma and bilateral hypertrophy. 4. Aortic atherosclerosis. 5. Stable large midline ventral hernia containing fat. 6. Lower lumbar spondylosis with prominent facet arthrosis. Electronically Signed   By: Kristine Garbe M.D.   On: 06/22/2018 16:29   Dg Chest  Portable 1 View  Result Date: 06/22/2018 CLINICAL DATA:  Hypotension and dizziness for 5 days. EXAM: PORTABLE CHEST 1 VIEW COMPARISON:  Single-view of the chest 12/05/2017. FINDINGS: The lungs are clear. Heart size is enlarged. No pneumothorax or pleural effusion. Aortic atherosclerosis noted. No acute bony abnormality. IMPRESSION: Cardiomegaly without acute disease. Atherosclerosis. Electronically Signed   By: Inge Rise M.D.   On: 06/22/2018 15:54      Scheduled Meds: . sodium chloride   Intravenous Once  . allopurinol  100 mg Oral Daily  . bisacodyl  10 mg Rectal Once  . mometasone-formoterol  2 puff Inhalation BID  . polyethylene glycol  17 g Oral Daily  . pravastatin  80 mg Oral Daily  . sodium chloride flush  3 mL Intravenous Q12H   Continuous Infusions: . sodium chloride Stopped (06/22/18 2030)  . pantoprozole (PROTONIX) infusion 8 mg/hr (06/23/18 0420)     LOS: 1 day    Time spent in minutes: 35    Debbe Odea, MD [facility] Pager: [email or web address] Password [facility] 06/23/2018, 11:04 AM

## 2018-06-23 NOTE — Progress Notes (Addendum)
Per Chester Holstein, NP with GI, keep patient NPO. Will continue to monitor.

## 2018-06-23 NOTE — Progress Notes (Signed)
New orders received; okay to give clear liquids. Will continue to monitor.

## 2018-06-23 NOTE — Progress Notes (Signed)
Rizwan, MD called to clarify clear liquid diet. Per report and note by Rebecka Apley, MD, gastroenterologist was contacted 7/11 and recommended to keep NPO after midnight. Rizwan, MD okay for patient to have clear liquid diet as she is not aware of any gastroenterologist consult and would like blood occult. Will continue to monitor.

## 2018-06-23 NOTE — Progress Notes (Signed)
Initial Nutrition Assessment  DOCUMENTATION CODES:   Non-severe (moderate) malnutrition in context of chronic illness, Obesity unspecified  INTERVENTION:   - Once diet advanced, Glucerna Shake po TID, each supplement provides 220 kcal and 10 grams of protein  - Encourage PO intake  NUTRITION DIAGNOSIS:   Moderate Malnutrition related to poor appetite, early satiety, nausea, chronic illness(CHF, CKD stage IV) as evidenced by mild fat depletion, mild muscle depletion, moderate muscle depletion, percent weight loss(17.5% weight loss in < 7 months).  GOAL:   Patient will meet greater than or equal to 90% of their needs  MONITOR:   Diet advancement, Weight trends, I & O's, Skin, Labs, PO intake  REASON FOR ASSESSMENT:   Other (Wound Report)    ASSESSMENT:   72 year old female who presented to the ED with hypotension. PMH significant for CHF, CAD, atrial fibrillation on Eliquis, hypertension, GERD, hyperlipidemia, CKD stage IV, type 2 diabetes mellitus, and recent GI bleed. Pt with recent hospitalization for melena and anemia due to upper GI bleed. Pt found to have bleeding AVM treated with APC.  Spoke with pt at bedside who reports "eating terrible" for "a long while." Pt denies currently being hungry and states that she hasn't had an appetite "in months." Pt also endorses early satiety, stating that if she eats past a certain point she has nausea. Pt reports eating 3 meals daily. Breakfast might include 1/2 pancake, 1/2 sausage, and 1/2 egg. Lunch may include an oral nutrition supplement (pt unsure of name). Dinner may include small portion of rice, 4 small pieces of chicken, and vegetables. Pt reports that her children take her out to eat because she does not cook as much as she used to. Pt reports ambulating with a Pendergraph or cane.  Pt reports that "a long time ago" she weighed 281 lbs and "cut down food to lose weight." Pt states that her most recent weight loss has been  unintentional. Pt with current bed weight of 94.1 kg (207 lbs). Per weight history in chart, pt has lost 44 lbs since 12/06/17. This is a 17.5% weight loss in less than less than 7 months which is severe and significant for timeframe. Given pt's diagnosis of CHF, suspect some of weight loss can be attributed to fluid status.  Pt agreeable to trying Glucerna oral nutrition supplement once diet advanced.  Medications reviewed and include: IV Protonix  Labs reviewed: BUN 91 (H), creatinine 2.89 (H), hemoglobin 8.9 (L), HCT 28.1 (L)  NUTRITION - FOCUSED PHYSICAL EXAM:    Most Recent Value  Orbital Region  Mild depletion  Upper Arm Region  Mild depletion  Thoracic and Lumbar Region  Unable to assess  Buccal Region  Mild depletion  Temple Region  Moderate depletion  Clavicle Bone Region  No depletion  Clavicle and Acromion Bone Region  No depletion  Scapular Bone Region  Unable to assess  Dorsal Hand  Mild depletion  Patellar Region  Moderate depletion  Anterior Thigh Region  Mild depletion  Posterior Calf Region  Moderate depletion  Edema (RD Assessment)  Moderate [BLE]  Hair  Reviewed  Eyes  Reviewed  Mouth  Reviewed [pt with no teeth]  Skin  Reviewed  Nails  Reviewed       Diet Order:   Diet Order           Diet clear liquid Room service appropriate? Yes; Fluid consistency: Thin  Diet effective now          EDUCATION NEEDS:  No education needs have been identified at this time  Skin:  Skin Assessment: Skin Integrity Issues Skin Integrity Issues: Stage II to sacrum  Last BM:  unknown/PTA  Height:   Ht Readings from Last 1 Encounters:  06/22/18 5\' 4"  (1.626 m)    Weight:   Wt Readings from Last 1 Encounters:  06/22/18 200 lb (90.7 kg)    Ideal Body Weight:  54.55 kg  BMI:  Body mass index is 34.33 kg/m.  Estimated Nutritional Needs:   Kcal:  1550-1750 kcal/day  Protein:  85-100 grams/day  Fluid:  1.5-1.7 L/day    [name], MS, RD,  LDN Pager: [phone] Weekend/After Hours: [phone]

## 2018-06-23 NOTE — Consult Note (Addendum)
Referring Provider: [facility]    Primary Care Physician:  Nolene Ebbs, MD Primary Gastroenterologist:   unassigned  Reason for Consultation:    melena    ASSESSMENT AND PLAN:     53. 72 yo female with multiple medical problems including Afib which which she takes Eliquis (last dose yesterday). Admitted with hypotension / shock. See #2.   2. Melena. Hospitalized late June with melena  / anemia. Hgb fell from baseline of ~ 11 to 9.1. EGD remarkable for small duodenal AVM, s/p APC. Melena has persisted and she has had a mild decline in hgb compared to last admission.  Doubt hypotension from hemorrhage. Though stools melenic, she is barely having BMs at home, actually constipated. On DRE there is scant melenic looking stool present. Hemoccult sent but sure it will be positive.  -on antibiotics  -Received a unit of blood yesterday for hgb of 8.3, hgb up not quite a 1 gram today at 8.9 -she may have additional AVMs in small bowel. I suspect she will need small bowel enteroscopy. Need to consider a colonoscopy as well. Need to wait for Eliquis washout. She is adamant that last colonoscopy was a year ago but can't remember who did it where is was done. I tried to reach PCP but no one answered at office.   -check iron studies -agree with PPI but BID should suffice.   3. Constipation. She has mild tenderness to firm area just right of umbilicus, ? Stool. Non-contrast CT scan unrevealing.  -start daily miralax.   3. AKI on CKD, improving  4. Chronic mild transaminitis. RUQ u/s negative. HCV, HBV s ag negative.  -further evaluation in outpatient setting   HPI: Kathleen [name] is a 72 y.o. female with CKD4, HTN, CAD, AFib on Eliquis. She was hospitalized late June with  Melena / anemia and abnormal liver chemistries ( mild transaminitis).  No liver abnormalities on RUQ u/s. Patient was evaluated by [facility], underwent EGD with findings of gastritis and a small bleeding AVM in DU, w/p  APC. Her initial hgb was 10.4, down from baseline of around 13. Nadir hgb during that admission was 9.1.   Patient came back to ED yesterday for dizziness, neck pain and abdominal pain. Family reported slurred speech. She has been hypotensive at times. Admitted with shock. On antibiotics. Patient says the dark stools have persisted since prior to last admission. No iron or bismuth. She has no N/V but described right mid abdominal discomfort which is low grade but constant. She has been constipated passing on small pieces of dark stool.   ED evaluation:  hgb 8.3 <<<< 9.1  Cr 3.4 <<<< 1.94 Given a liter of fluids and 1 unit of blood  Hgb 8.9 this am.    EGD June 2019 -findings of gastritis and a small bleeding AVM in DU, w/p APC.  Past Medical History:  Diagnosis Date  . Arthritis   . Blood transfusion    no side affects  . CHF (congestive heart failure) ([place])   . Coronary artery disease    Cypher stent to the RCA in 2003  . Diabetes mellitus    Borderline  . GERD (gastroesophageal reflux disease)   . Hypertension     Past Surgical History:  Procedure Laterality Date  . BIOPSY  06/06/2018   Procedure: BIOPSY;  Surgeon: Otis Brace, MD;  Location: [facility];  Service: Gastroenterology;;  . CARDIOVERSION N/A 03/17/2015   Procedure: CARDIOVERSION;  Surgeon: Sueanne Margarita, MD;  Location: [facility];  Service: Cardiovascular;  Laterality: N/A;  . CHOLECYSTECTOMY    . CORONARY ANGIOPLASTY WITH STENT PLACEMENT    . ESOPHAGOGASTRODUODENOSCOPY (EGD) WITH PROPOFOL N/A 06/06/2018   Procedure: ESOPHAGOGASTRODUODENOSCOPY (EGD) WITH PROPOFOL;  Surgeon: Otis Brace, MD;  Location: [facility];  Service: Gastroenterology;  Laterality: N/A;  . HOT HEMOSTASIS N/A 06/06/2018   Procedure: HOT HEMOSTASIS (ARGON PLASMA COAGULATION/BICAP);  Surgeon: Otis Brace, MD;  Location: [facility];  Service: Gastroenterology;  Laterality: N/A;  . TOTAL KNEE ARTHROPLASTY      Prior to  Admission medications   Medication Sig Start Date End Date Taking? Authorizing Provider  albuterol (PROVENTIL HFA;VENTOLIN HFA) 108 (90 Base) MCG/ACT inhaler Inhale 1-2 puffs into the lungs every 4 (four) hours as needed for wheezing or shortness of breath. 12/06/17  Yes Mariel Aloe, MD  albuterol (PROVENTIL) (2.5 MG/3ML) 0.083% nebulizer solution Take 2.5 mg by nebulization every 6 (six) hours as needed for wheezing or shortness of breath.   Yes [provider]  allopurinol (ZYLOPRIM) 100 MG tablet Take 100 mg by mouth daily.   Yes [provider]  ELIQUIS 5 MG TABS tablet take 1 tablet by mouth twice a day 07/13/16  Yes Minus Breeding, MD  famotidine (PEPCID) 20 MG tablet Take 20 mg by mouth at bedtime.  02/04/18  Yes [provider]  furosemide (LASIX) 80 MG tablet Take 1 tablet (80 mg total) by mouth 2 (two) times daily. 06/10/18  Yes Eugenie Filler, MD  isosorbide-hydrALAZINE (BIDIL) 20-37.5 MG tablet Take 1 tablet by mouth 3 (three) times daily. 06/10/18  Yes Eugenie Filler, MD  LANTUS SOLOSTAR 100 UNIT/ML Solostar Pen INJ 10 UNITS [place] QAM 03/17/18  Yes [provider]  metoprolol tartrate (LOPRESSOR) 25 MG tablet TAKE 3 TABLETS BY MOUTH TWICE A DAY Patient taking differently: TAKE 1 TABLET BY MOUTH TWICE A DAY 02/06/18  Yes Minus Breeding, MD  pantoprazole (PROTONIX) 40 MG tablet Take 1 tablet (40 mg total) by mouth daily. 06/07/18 06/07/19 Yes Eugenie Filler, MD  pravastatin (PRAVACHOL) 80 MG tablet Take 80 mg by mouth daily.   Yes [provider]  SYMBICORT 80-4.5 MCG/ACT inhaler Inhale 2 puffs into the lungs 2 (two) times daily. 02/02/18  Yes [provider]  COLCRYS 0.6 MG tablet Take 0.6 mg by mouth daily as needed (gout).  02/04/18   [provider]  cyclobenzaprine (FLEXERIL) 10 MG tablet Take 10 mg by mouth 3 (three) times daily as needed for muscle spasms.    [provider]  docusate sodium (COLACE) 100 MG  capsule Take 100 mg by mouth 2 (two) times daily as needed for mild constipation.    [provider]  guaiFENesin (MUCINEX) 600 MG 12 hr tablet Take 600 mg by mouth 2 (two) times daily as needed (congestion).    [provider]  pravastatin (PRAVACHOL) 80 MG tablet Take 1 tablet (80 mg total) by mouth daily. 11/23/11 03/08/18  Minus Breeding, MD    Current Facility-Administered Medications  Medication Dose Route Frequency Provider Last Rate Last Dose  . 0.9 %  sodium chloride infusion (Manually program via Guardrails IV Fluids)   Intravenous Once [name], MD      . 0.9 %  sodium chloride infusion   Intravenous Once [name], MD   Stopped at 06/22/18 2030  . acetaminophen (TYLENOL) tablet 650 mg  650 mg Oral Q6H PRN [name], MD       Or  .  acetaminophen (TYLENOL) suppository 650 mg  650 mg Rectal Q6H PRN [name], MD      . albuterol (PROVENTIL) (2.5 MG/3ML) 0.083% nebulizer solution 2.5 mg  2.5 mg Nebulization Q6H PRN [name], MD      . allopurinol (ZYLOPRIM) tablet 100 mg  100 mg Oral Daily [name], MD      . bisacodyl (DULCOLAX) suppository 10 mg  10 mg Rectal Once Debbe Odea, MD      . cyclobenzaprine (FLEXERIL) tablet 10 mg  10 mg Oral TID PRN [name], MD      . docusate sodium (COLACE) capsule 100 mg  100 mg Oral BID PRN [name], MD      . guaiFENesin [facility]) 12 hr tablet 600 mg  600 mg Oral BID PRN [name], MD      . mometasone-formoterol [facility]) 100-5 MCG/ACT inhaler 2 puff  2 puff Inhalation BID [name], MD   2 puff at 06/23/18 0910  . ondansetron (ZOFRAN) tablet 4 mg  4 mg Oral Q6H PRN [name], MD       Or  . ondansetron [facility]) injection 4 mg  4 mg Intravenous Q6H PRN [name], MD      . oxyCODONE (Oxy IR/ROXICODONE) immediate release tablet 5 mg  5 mg Oral Q4H PRN [name], MD      . pantoprazole (PROTONIX) 80 mg in sodium chloride 0.9 % 250 mL (0.32 mg/mL) infusion  8 mg/hr  Intravenous Continuous [name], MD 25 mL/hr at 06/23/18 0420 8 mg/hr at 06/23/18 0420  . pravastatin (PRAVACHOL) tablet 80 mg  80 mg Oral Daily [name], MD      . sodium chloride flush (NS) 0.9 % injection 3 mL  3 mL Intravenous Q12H [name], MD   3 mL at 06/22/18 2112    Allergies as of 06/22/2018 - Review Complete 06/22/2018  Allergen Reaction Noted  . Penicillins Hives and Shortness Of Breath 11/02/2011    Family History  Problem Relation Age of Onset  . Hypertension Father   . Heart disease Father   . Gout Father   . Arthritis Father   . Heart attack Father   . Heart attack Brother   . Hypertension Brother   . Hypertension Sister   . Stroke Neg Hx     Social History   Socioeconomic History  . Marital status: Divorced    Spouse name: Not on file  . Number of children: Not on file  . Years of education: Not on file  . Highest education level: Not on file  Occupational History  . Not on file  Social Needs  . Financial resource strain: Not on file  . Food insecurity:    Worry: Not on file    Inability: Not on file  . Transportation needs:    Medical: Not on file    Non-medical: Not on file  Tobacco Use  . Smoking status: Former Smoker    Packs/day: 0.30    Years: 30.00    Pack years: 9.00    Types: Cigarettes    Last attempt to quit: 01/18/2014    Years since quitting: 4.4  . Smokeless tobacco: Never Used  . Tobacco comment: Smoking cessation requested   Substance and Sexual Activity  . Alcohol use: No  . Drug use: No  . Sexual activity: Not Currently    Birth control/protection: Post-menopausal  Lifestyle  .  Physical activity:    Days per week: Not on file    Minutes per session: Not on file  . Stress: Not on file  Relationships  . Social connections:    Talks on phone: Not on file    Gets together: Not on file    Attends religious service: Not on file    Active member of club or organization: Not on file    Attends meetings of clubs  or organizations: Not on file    Relationship status: Not on file  . Intimate partner violence:    Fear of current or ex partner: Not on file    Emotionally abused: Not on file    Physically abused: Not on file    Forced sexual activity: Not on file  Other Topics Concern  . Not on file  Social History Narrative  . Not on file    Review of Systems: All systems reviewed and negative except where noted in HPI.  Physical Exam: Vital signs in last 24 hours: Temp:  [94.6 F (34.8 C)-98.2 F (36.8 C)] 98.2 F (36.8 C) (07/12 0800) Pulse Rate:  [51-100] 70 (07/12 0900) Resp:  [11-30] 18 (07/12 0900) BP: (67-150)/(41-87) 147/52 (07/12 0900) SpO2:  [96 %-100 %] 99 % (07/12 0900) Weight:  [200 lb (90.7 kg)] 200 lb (90.7 kg) (07/11 1454) Last BM Date: (PTA) General:   Alert, female in NAD Psych:  Pleasant, cooperative. Normal mood and affect. Eyes:  Pupils equal, sclera clear, no icterus.   Conjunctiva pink. Ears:  Normal auditory acuity. Nose:  No deformity, discharge,  or lesions. Neck:  Supple; no masses Lungs:  Clear throughout to auscultation.  i.  Heart:  Regular rate, irreg rhythm; no edema Abdomen:  Soft, non-distended, right mid tenderness. Slightly tender, golf ball size area just right of umbilicus which is slightly firm., BS active   Rectal:  Deferred  Msk:  Symmetrical without gross deformities. . Neurologic:  Alert and  oriented x4;  grossly normal neurologically. Skin:  Intact without significant lesions or rashes..   Intake/Output from previous day: 07/11 0701 - 07/12 0700 In: 1308.3 [IV Piggyback:1308.3] Out: -  Intake/Output this shift: No intake/output data recorded.  Lab Results: Recent Labs    06/22/18 1512 06/22/18 1516 06/23/18 0308  WBC 8.7  --  7.2  HGB 8.3* 9.9* 8.9*  HCT 26.5* 29.0* 28.1*  PLT 300  --  257   BMET Recent Labs    06/22/18 1512 06/22/18 1516 06/23/18 0308  NA 141 138 141  K 3.9 3.8 3.5  CL 102 99 105  CO2 27  --  26    GLUCOSE 139* 131* 97  BUN 95* 87* 91*  CREATININE 3.36* 3.40* 2.89*  CALCIUM 10.0  --  9.3   LFT Recent Labs    06/22/18 1512  PROT 7.3  ALBUMIN 3.6  AST 61*  ALT 71*  ALKPHOS 89  BILITOT 1.1  BILIDIR 0.4*  IBILI 0.7   PT/INR Recent Labs    06/22/18 1512  LABPROT 18.4*  INR 1.54   Hepatitis Panel No results for input(s): HEPBSAG, HCVAB, HEPAIGM, HEPBIGM in the last 72 hours.    Studies/Results: Ct Abdomen Pelvis Wo Contrast  Result Date: 06/22/2018 CLINICAL DATA:  72 y/o  F; mid abdominal pain.  Recent GI bleed. EXAM: CT ABDOMEN AND PELVIS WITHOUT CONTRAST TECHNIQUE: Multidetector CT imaging of the abdomen and pelvis was performed following the standard protocol without IV contrast. COMPARISON:  08/15/2015 CT abdomen and pelvis.  FINDINGS: Lower chest: No acute abnormality. Hepatobiliary: No focal liver abnormality is seen. Status post cholecystectomy. No biliary dilatation. Pancreas: Unremarkable. No pancreatic ductal dilatation or surrounding inflammatory changes. Spleen: Normal in size without focal abnormality. Adrenals/Urinary Tract: Stable right adrenal adenoma and bilateral hypertrophy. Right kidney cyst measuring up to 16 mm cyst. Kidneys are otherwise unremarkable, without renal calculi, focal lesion, or hydronephrosis. Bladder is unremarkable. Stomach/Bowel: Stomach is within normal limits. Appendix appears normal. No evidence of bowel wall thickening, distention, or inflammatory changes. Mild sigmoid diverticulosis without findings of acute diverticulitis. Vascular/Lymphatic: Aortic atherosclerosis. No enlarged abdominal or pelvic lymph nodes. Reproductive: Uterus and bilateral adnexa are unremarkable. Other: Stable large midline ventral hernia containing fat. Musculoskeletal: No fracture is seen. L4-5 and L5-S1 grade 1 anterolisthesis. Lumbar spondylosis with prominent lower lumbar facet arthrosis. IMPRESSION: 1. No acute process identified. 2. Mild sigmoid  diverticulosis, no findings of acute diverticulitis. 3. Stable right adrenal adenoma and bilateral hypertrophy. 4. Aortic atherosclerosis. 5. Stable large midline ventral hernia containing fat. 6. Lower lumbar spondylosis with prominent facet arthrosis. Electronically Signed   By: Kristine Garbe M.D.   On: 06/22/2018 16:29   Dg Chest Portable 1 View  Result Date: 06/22/2018 CLINICAL DATA:  Hypotension and dizziness for 5 days. EXAM: PORTABLE CHEST 1 VIEW COMPARISON:  Single-view of the chest 12/05/2017. FINDINGS: The lungs are clear. Heart size is enlarged. No pneumothorax or pleural effusion. Aortic atherosclerosis noted. No acute bony abnormality. IMPRESSION: Cardiomegaly without acute disease. Atherosclerosis. Electronically Signed   By: Inge Rise M.D.   On: 06/22/2018 15:54     Tye Savoy, NP-C @  06/23/2018, 9:46 AM    Attending physician's note   I have taken an interval history, reviewed the chart and examined the patient. I agree with the Advanced Practitioner's note, impression and recommendations.  72 year old female with history of A. fib on anticoagulation admitted with hypotension and worsening anemia.  She was recently hospitalized 2 weeks ago with melena, underwent EGD noted duodenal AVM status post APC.  Patient continued to have persistent melena at home and hemoglobin dropped from 9 to 8.  Patient had colonoscopy by Dr. Carol Ada in March 2014 for anemia, good prep, sigmoid diverticulosis and internal hemorrhoids otherwise unremarkable exam  Continue to monitor hemoglobin IV iron infusion Will schedule for small bowel video capsule as inpatient.   Damaris Hippo , MD [phone]

## 2018-06-24 DIAGNOSIS — K269 Duodenal ulcer, unspecified as acute or chronic, without hemorrhage or perforation: Secondary | ICD-10-CM

## 2018-06-24 LAB — CBC
HCT: 27.5 % — ABNORMAL LOW (ref 36.0–46.0)
Hemoglobin: 8.5 g/dL — ABNORMAL LOW (ref 12.0–15.0)
MCH: 25.8 pg — AB (ref 26.0–34.0)
MCHC: 30.9 g/dL (ref 30.0–36.0)
MCV: 83.6 fL (ref 78.0–100.0)
PLATELETS: 233 10*3/uL (ref 150–400)
RBC: 3.29 MIL/uL — ABNORMAL LOW (ref 3.87–5.11)
RDW: 19 % — ABNORMAL HIGH (ref 11.5–15.5)
WBC: 6.2 10*3/uL (ref 4.0–10.5)

## 2018-06-24 LAB — GLUCOSE, CAPILLARY
GLUCOSE-CAPILLARY: 78 mg/dL (ref 70–99)
GLUCOSE-CAPILLARY: 119 mg/dL — AB (ref 70–99)
Glucose-Capillary: 90 mg/dL (ref 70–99)
Glucose-Capillary: 102 mg/dL — ABNORMAL HIGH (ref 70–99)

## 2018-06-24 LAB — BASIC METABOLIC PANEL
Anion gap: 8 (ref 5–15)
BUN: 69 mg/dL — AB (ref 8–23)
CALCIUM: 9.2 mg/dL (ref 8.9–10.3)
CO2: 24 mmol/L (ref 22–32)
Chloride: 109 mmol/L (ref 98–111)
Creatinine, Ser: 2.16 mg/dL — ABNORMAL HIGH (ref 0.44–1.00)
GFR calc Af Amer: 25 mL/min — ABNORMAL LOW (ref >60)
GFR, EST NON AFRICAN AMERICAN: 21 mL/min — AB (ref >60)
GLUCOSE: 87 mg/dL (ref 70–99)
Potassium: 3.5 mmol/L (ref 3.5–5.1)
SODIUM: 141 mmol/L (ref 135–145)

## 2018-06-24 MED ORDER — PEG-KCL-NACL-NASULF-NA ASC-C 100 G PO SOLR
0.5000 | Freq: Once | ORAL | Status: AC
  Administered 2018-06-24: 100 g via ORAL
  Filled 2018-06-24: qty 1

## 2018-06-24 NOTE — Progress Notes (Addendum)
[facility] Progress Note Covering for Dr. Benson Norway   Chief Complaint:   melena    SUBJECTIVE:    no complaints today   ASSESSMENT AND PLAN:   73 yo female with melena. She is s/p EGD with APC of duodenal AVM late June. Patient is actually patient of Dr. Benson Norway and apparently had a normal colonoscopy ~ 5 years go.  -for further evaluation of ongoing melena will arrange for endo capsule study in am.  -hgb stable at 8.5 post transfusion.  -continue IV ppi    OBJECTIVE:     Vital signs in last 24 hours: Temp:  [97.5 F (36.4 C)-98.6 F (37 C)] 98.1 F (36.7 C) (07/13 1200) Pulse Rate:  [59-145] 73 (07/13 0900) Resp:  [12-27] 19 (07/13 0900) BP: (93-136)/(41-81) 128/59 (07/13 0800) SpO2:  [96 %-100 %] 100 % (07/13 0900) Last BM Date: (PTA) General:   Alert, well-developed,female in NAD EENT:  Normal hearing, non icteric sclera, conjunctive pink.  Heart:  Regular rate and rhythm; no lower extremity edema Pulm: Normal respiratory effort. Abdomen:  Soft, nondistended, nontender.  Normal bowel sounds, no masses felt.     Neurologic:  Alert and  oriented x4;  grossly normal neurologically. Psych:  Pleasant, cooperative.  Normal mood and affect.   Intake/Output from previous day: 07/12 0701 - 07/13 0700 In: 921.3 [I.V.:921.3] Out: 1450 [Urine:1450] Intake/Output this shift: No intake/output data recorded.  Lab Results: Recent Labs    06/23/18 1148 06/23/18 2314 06/24/18 1105  WBC 8.2 7.3 6.2  HGB 9.1* 8.8* 8.5*  HCT 29.0* 27.8* 27.5*  PLT 277 251 233   BMET Recent Labs    06/22/18 1512 06/22/18 1516 06/23/18 0308 06/24/18 0807  NA 141 138 141 141  K 3.9 3.8 3.5 3.5  CL 102 99 105 109  CO2 27  --  26 24  GLUCOSE 139* 131* 97 87  BUN 95* 87* 91* 69*  CREATININE 3.36* 3.40* 2.89* 2.16*  CALCIUM 10.0  --  9.3 9.2   LFT Recent Labs    06/22/18 1512  PROT 7.3  ALBUMIN 3.6  AST 61*  ALT 71*  ALKPHOS 89  BILITOT 1.1  BILIDIR 0.4*   IBILI 0.7   PT/INR Recent Labs    06/22/18 1512  LABPROT 18.4*  INR 1.54    Ct Abdomen Pelvis Wo Contrast  Result Date: 06/22/2018 CLINICAL DATA:  72 y/o  F; mid abdominal pain.  Recent GI bleed. EXAM: CT ABDOMEN AND PELVIS WITHOUT CONTRAST TECHNIQUE: Multidetector CT imaging of the abdomen and pelvis was performed following the standard protocol without IV contrast. COMPARISON:  08/15/2015 CT abdomen and pelvis. FINDINGS: Lower chest: No acute abnormality. Hepatobiliary: No focal liver abnormality is seen. Status post cholecystectomy. No biliary dilatation. Pancreas: Unremarkable. No pancreatic ductal dilatation or surrounding inflammatory changes. Spleen: Normal in size without focal abnormality. Adrenals/Urinary Tract: Stable right adrenal adenoma and bilateral hypertrophy. Right kidney cyst measuring up to 16 mm cyst. Kidneys are otherwise unremarkable, without renal calculi, focal lesion, or hydronephrosis. Bladder is unremarkable. Stomach/Bowel: Stomach is within normal limits. Appendix appears normal. No evidence of bowel wall thickening, distention, or inflammatory changes. Mild sigmoid diverticulosis without findings of acute diverticulitis. Vascular/Lymphatic: Aortic atherosclerosis. No enlarged abdominal or pelvic lymph nodes. Reproductive: Uterus and bilateral adnexa are unremarkable. Other: Stable large midline ventral hernia containing fat. Musculoskeletal: No fracture is seen. L4-5 and L5-S1 grade 1 anterolisthesis. Lumbar spondylosis with prominent lower lumbar facet arthrosis. IMPRESSION: 1. No acute process  identified. 2. Mild sigmoid diverticulosis, no findings of acute diverticulitis. 3. Stable right adrenal adenoma and bilateral hypertrophy. 4. Aortic atherosclerosis. 5. Stable large midline ventral hernia containing fat. 6. Lower lumbar spondylosis with prominent facet arthrosis. Electronically Signed   By: Kristine Garbe M.D.   On: 06/22/2018 16:29   Dg Chest Portable  1 View  Result Date: 06/22/2018 CLINICAL DATA:  Hypotension and dizziness for 5 days. EXAM: PORTABLE CHEST 1 VIEW COMPARISON:  Single-view of the chest 12/05/2017. FINDINGS: The lungs are clear. Heart size is enlarged. No pneumothorax or pleural effusion. Aortic atherosclerosis noted. No acute bony abnormality. IMPRESSION: Cardiomegaly without acute disease. Atherosclerosis. Electronically Signed   By: Inge Rise M.D.   On: 06/22/2018 15:54     Principal Problem:   Hypotension due to hypovolemia Active Problems:   Diabetes mellitus (HCC)   Non-ST elevation MI (NSTEMI) (HCC)   Chronic diastolic CHF (congestive heart failure) (HCC)   Melena   Atrial fibrillation, chronic (HCC)   Upper GI bleed   Duodenitis   Acute metabolic encephalopathy   Acute on chronic renal failure (HCC)   Transaminitis   Anemia due to GI blood loss   Epigastric pain   Decreased oral intake   Hypovolemic shock ([place])     LOS: 2 days   Tye Savoy ,NP 06/24/2018, 12:09 PM   Attending physician's note   I have taken an interval history, reviewed the chart and examined the patient. I agree with the Advanced Practitioner's note, impression and recommendations.  Plan for small bowel video capsule tomorrow to exclude small bowel AVM Hemoglobin remained stable status post transfusion Dr. Blair Dolphin will assume care on Monday  Damaris Hippo , MD [phone]

## 2018-06-24 NOTE — Progress Notes (Signed)
PROGRESS NOTE    Kathleen [name]   ZOX:[id number]  DOB: 12/16/1945  DOA: 06/22/2018 PCP: Nolene Ebbs, MD   Brief Narrative:  Kathleen [name] is a 72 y/o female with has h/o A-fib on Eliquis, d CHF on Lasix, CAD/NSTEMI, DM2, CKD4 and chronic transaminitis who was recetnly admitted with melena and underwent an ablation of an AVM.  After discharge, she states, she continued to have black stools and presented back to the hospital for dizziness. She was found to have AKI and hypotention with SBP in 60s. She was given IVF and transfused 1 U PRBC. She was started on a Protonix infusion and admitted. GI was consulted.    Subjective:  She had a BM yesterday which was black. No other complaints.    Assessment & Plan:   Principal Problem:   Hypotension due to hypovolemia - IVF and PRBC given with improvement in BP and symptoms - cont to follow in SDU as she is still bleeding  Active Problems:    Acute on chronic renal failure - due to hypovolemia in setting of GI bleed and Lasix- cont slow IVF and follow Cr which is improving    Anemia due to GI blood loss/   Upper GI bleed - Hold Eliquis, clear liquids for now - GI plans on small bowel video capsule - given 1 U PRBC on 7/11- follow Hb every 12 hrs    Diabetes mellitus  - Lantus is on hold - place on sSI    Chronic diastolic CHF   - holding Lasix- give slow IVF to prevent fluid overload    Atrial fibrillation, chronic  - cont to hold Eliquis - she is on Metoprolol 75 mg BID- I will resume this at 25 mg BID for now and follow HR   Transaminitis - this is chronic and mild- GI aware    DVT prophylaxis: SCDs Code Status: Full code Family Communication:  Disposition Plan: cont to follow renal function and Hb Consultants:   GI Procedures:   none Antimicrobials:  Anti-infectives (From admission, onward)   Start     Dose/Rate Route Frequency Ordered Stop   06/22/18 1600  ceFEPIme (MAXIPIME) 2 g in sodium chloride 0.9 %  100 mL IVPB     2 g 200 mL/hr over 30 Minutes Intravenous  Once 06/22/18 1545 06/22/18 1718   06/22/18 1515  metroNIDAZOLE (FLAGYL) IVPB 500 mg     500 mg 100 mL/hr over 60 Minutes Intravenous  Once 06/22/18 1513 06/22/18 1638       Objective: Vitals:   06/24/18 0500 06/24/18 0600 06/24/18 0800 06/24/18 0900  BP:  (!) 124/48 (!) 128/59   Pulse: 85 70 75 73  Resp: 15 (!) 27 16 19   Temp:   98.6 F (37 C)   TempSrc:   Oral   SpO2: 100% 99% 100% 100%  Weight:      Height:        Intake/Output Summary (Last 24 hours) at 06/24/2018 1157 Last data filed at 06/24/2018 0600 Gross per 24 hour  Intake 921.25 ml  Output 1450 ml  Net -528.75 ml   Filed Weights   06/22/18 1454  Weight: 90.7 kg (200 lb)    Examination: General exam: Appears comfortable  HEENT: PERRLA, oral mucosa moist, no sclera icterus or thrush Respiratory system: Clear to auscultation. Respiratory effort normal. Cardiovascular system: S1 & S2 heard, RRR.   Gastrointestinal system: Abdomen soft, non-tender, nondistended. Normal bowel sound. No organomegaly Central nervous system: Alert  and oriented. No focal neurological deficits. Extremities: No cyanosis, clubbing or edema Skin: No rashes or ulcers Psychiatry:  Mood & affect appropriate.   Data Reviewed: I have personally reviewed following labs and imaging studies  CBC: Recent Labs  Lab 06/22/18 1512 06/22/18 1516 06/23/18 0308 06/23/18 1148 06/23/18 2314 06/24/18 1105  WBC 8.7  --  7.2 8.2 7.3 6.2  HGB 8.3* 9.9* 8.9* 9.1* 8.8* 8.5*  HCT 26.5* 29.0* 28.1* 29.0* 27.8* 27.5*  MCV 82.6  --  82.2 82.9 83.0 83.6  PLT 300  --  257 277 251 409   Basic Metabolic Panel: Recent Labs  Lab 06/22/18 1512 06/22/18 1516 06/23/18 0308 06/24/18 0807  NA 141 138 141 141  K 3.9 3.8 3.5 3.5  CL 102 99 105 109  CO2 27  --  26 24  GLUCOSE 139* 131* 97 87  BUN 95* 87* 91* 69*  CREATININE 3.36* 3.40* 2.89* 2.16*  CALCIUM 10.0  --  9.3 9.2   GFR: Estimated  Creatinine Clearance: 25.3 mL/min (A) (by C-G formula based on SCr of 2.16 mg/dL (H)). Liver Function Tests: Recent Labs  Lab 06/22/18 1512  AST 61*  ALT 71*  ALKPHOS 89  BILITOT 1.1  PROT 7.3  ALBUMIN 3.6   Recent Labs  Lab 06/22/18 1512  LIPASE 35   No results for input(s): AMMONIA in the last 168 hours. Coagulation Profile: Recent Labs  Lab 06/22/18 1512  INR 1.54   Cardiac Enzymes: No results for input(s): CKTOTAL, CKMB, CKMBINDEX, TROPONINI in the last 168 hours. BNP (last 3 results) No results for input(s): PROBNP in the last 8760 hours. HbA1C: No results for input(s): HGBA1C in the last 72 hours. CBG: Recent Labs  Lab 06/23/18 1657 06/23/18 1955 06/23/18 2343 06/24/18 0800 06/24/18 1142  GLUCAP 80 85 117* 78 90   Lipid Profile: No results for input(s): CHOL, HDL, LDLCALC, TRIG, CHOLHDL, LDLDIRECT in the last 72 hours. Thyroid Function Tests: No results for input(s): TSH, T4TOTAL, FREET4, T3FREE, THYROIDAB in the last 72 hours. Anemia Panel: Recent Labs    06/23/18 1148  VITAMINB12 731  FOLATE 10.8  FERRITIN 21  TIBC 553*  IRON 43  RETICCTPCT 2.0   Urine analysis:    Component Value Date/Time   COLORURINE YELLOW 06/23/2018 [place] 06/23/2018 1734   LABSPEC 1.009 06/23/2018 1734   PHURINE 5.0 06/23/2018 1734   GLUCOSEU NEGATIVE 06/23/2018 1734   HGBUR NEGATIVE 06/23/2018 1734   BILIRUBINUR NEGATIVE 06/23/2018 1734   KETONESUR NEGATIVE 06/23/2018 1734   PROTEINUR NEGATIVE 06/23/2018 1734   UROBILINOGEN 1.0 08/15/2015 0915   NITRITE NEGATIVE 06/23/2018 1734   LEUKOCYTESUR TRACE (A) 06/23/2018 1734   Sepsis Labs: @LABRCNTIP (procalcitonin:4,lacticidven:4) ) Recent Results (from the past 240 hour(s))  Blood culture (routine x 2)     Status: None (Preliminary result)   Collection Time: 06/22/18  3:11 PM  Result Value Ref Range Status   Specimen Description   Final    BLOOD LEFT ANTECUBITAL Performed at [facility], [place] [address] [place], [place] [address]    Special Requests   Final    BOTTLES DRAWN AEROBIC AND ANAEROBIC Blood Culture adequate volume Performed at [facility] [address], [place], [place] [address]    Culture   Final    NO GROWTH < 24 HOURS Performed at [place] [address], [place], [place] [address]    Report Status PENDING  Incomplete  Blood culture (  routine x 2)     Status: None (Preliminary result)   Collection Time: 06/22/18  3:34 PM  Result Value Ref Range Status   Specimen Description   Final    BLOOD LEFT ANTECUBITAL Performed at [facility] [address], [place], [place] [address]    Special Requests   Final    BOTTLES DRAWN AEROBIC AND ANAEROBIC Blood Culture adequate volume Performed at [facility] [address] [place], [place] [address]    Culture   Final    NO GROWTH < 24 HOURS Performed at [facility] [address] [place], [place] [address]    Report Status PENDING  Incomplete  MRSA PCR Screening     Status: None   Collection Time: 06/22/18  9:09 PM  Result Value Ref Range Status   MRSA by PCR NEGATIVE NEGATIVE Final    Comment:        The GeneXpert MRSA Assay (FDA approved for NASAL specimens only), is one component of a comprehensive MRSA colonization surveillance program. It is not intended to diagnose MRSA infection nor to guide or monitor treatment for MRSA infections. Performed at [facility], [place] [address] [place], [place] [address]          Radiology Studies: Ct Abdomen Pelvis Wo Contrast  Result Date: 06/22/2018 CLINICAL DATA:  72 y/o  F; mid abdominal pain.  Recent GI bleed. EXAM: CT ABDOMEN AND PELVIS WITHOUT CONTRAST TECHNIQUE: Multidetector CT imaging of the abdomen and pelvis was performed following the standard protocol without IV contrast. COMPARISON:  08/15/2015 CT abdomen and pelvis.  FINDINGS: Lower chest: No acute abnormality. Hepatobiliary: No focal liver abnormality is seen. Status post cholecystectomy. No biliary dilatation. Pancreas: Unremarkable. No pancreatic ductal dilatation or surrounding inflammatory changes. Spleen: Normal in size without focal abnormality. Adrenals/Urinary Tract: Stable right adrenal adenoma and bilateral hypertrophy. Right kidney cyst measuring up to 16 mm cyst. Kidneys are otherwise unremarkable, without renal calculi, focal lesion, or hydronephrosis. Bladder is unremarkable. Stomach/Bowel: Stomach is within normal limits. Appendix appears normal. No evidence of bowel wall thickening, distention, or inflammatory changes. Mild sigmoid diverticulosis without findings of acute diverticulitis. Vascular/Lymphatic: Aortic atherosclerosis. No enlarged abdominal or pelvic lymph nodes. Reproductive: Uterus and bilateral adnexa are unremarkable. Other: Stable large midline ventral hernia containing fat. Musculoskeletal: No fracture is seen. L4-5 and L5-S1 grade 1 anterolisthesis. Lumbar spondylosis with prominent lower lumbar facet arthrosis. IMPRESSION: 1. No acute process identified. 2. Mild sigmoid diverticulosis, no findings of acute diverticulitis. 3. Stable right adrenal adenoma and bilateral hypertrophy. 4. Aortic atherosclerosis. 5. Stable large midline ventral hernia containing fat. 6. Lower lumbar spondylosis with prominent facet arthrosis. Electronically Signed   By: Kristine Garbe M.D.   On: 06/22/2018 16:29   Dg Chest Portable 1 View  Result Date: 06/22/2018 CLINICAL DATA:  Hypotension and dizziness for 5 days. EXAM: PORTABLE CHEST 1 VIEW COMPARISON:  Single-view of the chest 12/05/2017. FINDINGS: The lungs are clear. Heart size is enlarged. No pneumothorax or pleural effusion. Aortic atherosclerosis noted. No acute bony abnormality. IMPRESSION: Cardiomegaly without acute disease. Atherosclerosis. Electronically Signed   By: Inge Rise M.D.    On: 06/22/2018 15:54      Scheduled Meds: . allopurinol  100 mg Oral Daily  . bisacodyl  10 mg Rectal Once  . insulin aspart  0-5 Units Subcutaneous QHS  . insulin aspart  0-9 Units Subcutaneous TID WC  . metoprolol tartrate  25 mg Oral BID  .  mometasone-formoterol  2 puff Inhalation BID  . polyethylene glycol  17 g Oral Daily  . pravastatin  80 mg Oral Daily  . sodium chloride flush  3 mL Intravenous Q12H   Continuous Infusions: . sodium chloride 50 mL/hr at 06/23/18 1255  . pantoprozole (PROTONIX) infusion 8 mg/hr (06/24/18 1037)     LOS: 2 days    Time spent in minutes: 35    Debbe Odea, MD [facility] Pager: [email or web address] Password [facility] 06/24/2018, 11:57 AM

## 2018-06-25 ENCOUNTER — Encounter (HOSPITAL_COMMUNITY)
Admission: EM | Disposition: A | Discharge status: Home / Self Care | Payer: Self-pay | Source: Home / Self Care | Attending: Internal Medicine

## 2018-06-25 HISTORY — PX: GIVENS CAPSULE STUDY: SHX5432

## 2018-06-25 LAB — CBC
HEMATOCRIT: 28.2 % — AB (ref 36.0–46.0)
HEMATOCRIT: 33.2 % — AB (ref 36.0–46.0)
HEMOGLOBIN: 8.6 g/dL — AB (ref 12.0–15.0)
Hemoglobin: 10 g/dL — ABNORMAL LOW (ref 12.0–15.0)
MCH: 25.8 pg — ABNORMAL LOW (ref 26.0–34.0)
MCH: 26.1 pg (ref 26.0–34.0)
MCHC: 30.1 g/dL (ref 30.0–36.0)
MCHC: 30.5 g/dL (ref 30.0–36.0)
MCV: 85.8 fL (ref 78.0–100.0)
MCV: 85.7 fL (ref 78.0–100.0)
PLATELETS: 231 10*3/uL (ref 150–400)
Platelets: 228 10*3/uL (ref 150–400)
RBC: 3.87 MIL/uL (ref 3.87–5.11)
RBC: 3.29 MIL/uL — ABNORMAL LOW (ref 3.87–5.11)
RDW: 19.2 % — AB (ref 11.5–15.5)
RDW: 19.2 % — AB (ref 11.5–15.5)
WBC: 6.3 10*3/uL (ref 4.0–10.5)
WBC: 7 10*3/uL (ref 4.0–10.5)

## 2018-06-25 LAB — GLUCOSE, CAPILLARY
GLUCOSE-CAPILLARY: 76 mg/dL (ref 70–99)
Glucose-Capillary: 86 mg/dL (ref 70–99)
Glucose-Capillary: 79 mg/dL (ref 70–99)
Glucose-Capillary: 157 mg/dL — ABNORMAL HIGH (ref 70–99)

## 2018-06-25 SURGERY — IMAGING PROCEDURE, GI TRACT, INTRALUMINAL, VIA CAPSULE
Anesthesia: LOCAL

## 2018-06-25 MED ORDER — METOPROLOL TARTRATE 50 MG PO TABS
50.0000 mg | ORAL_TABLET | Freq: Two times a day (BID) | ORAL | Status: DC
  Administered 2018-06-25 – 2018-06-29 (×8): 50 mg via ORAL
  Filled 2018-06-25: qty 1
  Filled 2018-06-25: qty 2
  Filled 2018-06-25 (×6): qty 1

## 2018-06-25 SURGICAL SUPPLY — 1 items: TOWEL COTTON PACK 4EA (MISCELLANEOUS) ×4 IMPLANT

## 2018-06-25 NOTE — Progress Notes (Signed)
RN to patients room for capsule study. Patient swallowed capsule @ 8406 with no problems. RN viewed entrance into stomach on monitor. Patient given instructions and RN given instructions on NPO status and advancement of diet. Capsule can be removed @ 2120 and placed in a bag in patients room for pick up early morning on 7.15.19.

## 2018-06-25 NOTE — Progress Notes (Addendum)
PROGRESS NOTE    Kathleen [name]   PNT:[id number]  DOB: [birth year]  DOA: 06/22/2018 PCP: Nolene Ebbs, MD   Brief Narrative:  Kathleen [name] is a 72 y/o female with has h/o A-fib on Eliquis, d CHF on Lasix, CAD/NSTEMI, DM2, CKD4 and chronic transaminitis who was recetnly admitted with melena and underwent an ablation of an AVM.  After discharge, she states, she continued to have black stools and presented back to the hospital for dizziness. She was found to have AKI and hypotention with SBP in 60s. She was given IVF and transfused 1 U PRBC. She was started on a Protonix infusion and admitted. GI was consulted.    Subjective: She has swallowed the capsul today and has no complaints. Still having dark stools.  Assessment & Plan:   Principal Problem:   Hypotension due to hypovolemia - IVF and PRBC given with improvement in BP and symptoms - cont to follow in SDU due to ongoing upper GI bleed  Active Problems:    Acute on chronic renal failure - due to hypovolemia in setting of GI bleed and Lasix- cont slow IVF and follow Cr which is improving    Anemia due to GI blood loss/   Upper GI bleed - Hold Eliquis  -  small bowel video capsule swallowed today - given 1 U PRBC on 7/11 - following Hb      Diabetes mellitus  - Lantus is on hold - place on sSI    Chronic diastolic CHF   - holding Lasix- give slow IVF to prevent fluid overload    Atrial fibrillation, chronic  - cont to hold Eliquis - she is on Metoprolol 75 mg BID at home- increase to 50 BID now    Transaminitis - this is chronic and mild- GI aware    DVT prophylaxis: SCDs Code Status: Full code Family Communication:  Disposition Plan: cont to follow renal function and Hb Consultants:   GI Procedures:   none Antimicrobials:  Anti-infectives (From admission, onward)   Start     Dose/Rate Route Frequency Ordered Stop   06/22/18 1600  ceFEPIme (MAXIPIME) 2 g in sodium chloride 0.9 % 100 mL IVPB     2  g 200 mL/hr over 30 Minutes Intravenous  Once 06/22/18 1545 06/22/18 1718   06/22/18 1515  metroNIDAZOLE (FLAGYL) IVPB 500 mg     500 mg 100 mL/hr over 60 Minutes Intravenous  Once 06/22/18 1513 06/22/18 1638       Objective: Vitals:   06/25/18 0400 06/25/18 0600 06/25/18 0800 06/25/18 1121  BP:  (!) 129/50    Pulse:  100    Resp:  13    Temp: 97.6 F (36.4 C)  98.7 F (37.1 C) 98.6 F (37 C)  TempSrc: Oral  Oral Oral  SpO2:  100%    Weight:      Height:       No intake or output data in the 24 hours ending 06/25/18 1541 Filed Weights   06/22/18 1454  Weight: 90.7 kg (200 lb)    Examination: General exam: Appears comfortable  HEENT: PERRLA, oral mucosa moist, no sclera icterus or thrush Respiratory system: Clear to auscultation. Respiratory effort normal. Cardiovascular system: S1 & S2 heard, RRR.   Gastrointestinal system: Abdomen soft, non-tender, nondistended. Normal bowel sound. No organomegaly Central nervous system: Alert and oriented. No focal neurological deficits. Extremities: No cyanosis, clubbing or edema Skin: No rashes or ulcers Psychiatry:  Mood & affect appropriate.  Data Reviewed: I have personally reviewed following labs and imaging studies  CBC: Recent Labs  Lab 06/23/18 1148 06/23/18 2314 06/24/18 1105 06/25/18 0018 06/25/18 1119  WBC 8.2 7.3 6.2 7.0 6.3  HGB 9.1* 8.8* 8.5* 10.0* 8.6*  HCT 29.0* 27.8* 27.5* 33.2* 28.2*  MCV 82.9 83.0 83.6 85.8 85.7  PLT 277 251 233 231 341   Basic Metabolic Panel: Recent Labs  Lab 06/22/18 1512 06/22/18 1516 06/23/18 0308 06/24/18 0807  NA 141 138 141 141  K 3.9 3.8 3.5 3.5  CL 102 99 105 109  CO2 27  --  26 24  GLUCOSE 139* 131* 97 87  BUN 95* 87* 91* 69*  CREATININE 3.36* 3.40* 2.89* 2.16*  CALCIUM 10.0  --  9.3 9.2   GFR: Estimated Creatinine Clearance: 25.3 mL/min (A) (by C-G formula based on SCr of 2.16 mg/dL (H)). Liver Function Tests: Recent Labs  Lab 06/22/18 1512  AST 61*   ALT 71*  ALKPHOS 89  BILITOT 1.1  PROT 7.3  ALBUMIN 3.6   Recent Labs  Lab 06/22/18 1512  LIPASE 35   No results for input(s): AMMONIA in the last 168 hours. Coagulation Profile: Recent Labs  Lab 06/22/18 1512  INR 1.54   Cardiac Enzymes: No results for input(s): CKTOTAL, CKMB, CKMBINDEX, TROPONINI in the last 168 hours. BNP (last 3 results) No results for input(s): PROBNP in the last 8760 hours. HbA1C: No results for input(s): HGBA1C in the last 72 hours. CBG: Recent Labs  Lab 06/24/18 1142 06/24/18 1616 06/24/18 2246 06/25/18 0742 06/25/18 1114  GLUCAP 90 119* 102* 86 76   Lipid Profile: No results for input(s): CHOL, HDL, LDLCALC, TRIG, CHOLHDL, LDLDIRECT in the last 72 hours. Thyroid Function Tests: No results for input(s): TSH, T4TOTAL, FREET4, T3FREE, THYROIDAB in the last 72 hours. Anemia Panel: Recent Labs    06/23/18 1148  VITAMINB12 731  FOLATE 10.8  FERRITIN 21  TIBC 553*  IRON 43  RETICCTPCT 2.0   Urine analysis:    Component Value Date/Time   COLORURINE YELLOW 06/23/2018 [place] 06/23/2018 1734   LABSPEC 1.009 06/23/2018 1734   PHURINE 5.0 06/23/2018 1734   GLUCOSEU NEGATIVE 06/23/2018 1734   HGBUR NEGATIVE 06/23/2018 1734   BILIRUBINUR NEGATIVE 06/23/2018 1734   KETONESUR NEGATIVE 06/23/2018 1734   PROTEINUR NEGATIVE 06/23/2018 1734   UROBILINOGEN 1.0 08/15/2015 0915   NITRITE NEGATIVE 06/23/2018 1734   LEUKOCYTESUR TRACE (A) 06/23/2018 1734   Sepsis Labs: @LABRCNTIP (procalcitonin:4,lacticidven:4) ) Recent Results (from the past 240 hour(s))  Blood culture (routine x 2)     Status: None (Preliminary result)   Collection Time: 06/22/18  3:11 PM  Result Value Ref Range Status   Specimen Description   Final    BLOOD LEFT ANTECUBITAL Performed at [facility], [place] [address], [place], [place] [address]    Special Requests   Final    BOTTLES DRAWN AEROBIC AND ANAEROBIC Blood Culture adequate  volume Performed at [facility] [address], [place], [place] [address]    Culture   Final    NO GROWTH 2 DAYS Performed at [place] [address] [place], [place] [address]    Report Status PENDING  Incomplete  Blood culture (routine x 2)     Status: None (Preliminary result)   Collection Time: 06/22/18  3:34 PM  Result Value Ref Range Status   Specimen Description   Final    BLOOD LEFT ANTECUBITAL Performed  at [facility], [place] [address] [place], [place] [address]    Special Requests   Final    BOTTLES DRAWN AEROBIC AND ANAEROBIC Blood Culture adequate volume Performed at [place] [address], [place], [place] [address]    Culture   Final    NO GROWTH 2 DAYS Performed at [place] [address], [place], [place] [address]    Report Status PENDING  Incomplete  MRSA PCR Screening     Status: None   Collection Time: 06/22/18  9:09 PM  Result Value Ref Range Status   MRSA by PCR NEGATIVE NEGATIVE Final    Comment:        The GeneXpert MRSA Assay (FDA approved for NASAL specimens only), is one component of a comprehensive MRSA colonization surveillance program. It is not intended to diagnose MRSA infection nor to guide or monitor treatment for MRSA infections. Performed at [facility], [place] [address], [place], [place] [address]          Radiology Studies: No results found.    Scheduled Meds: . allopurinol  100 mg Oral Daily  . bisacodyl  10 mg Rectal Once  . insulin aspart  0-5 Units Subcutaneous QHS  . insulin aspart  0-9 Units Subcutaneous TID WC  . metoprolol tartrate  50 mg Oral BID  . mometasone-formoterol  2 puff Inhalation BID  . polyethylene glycol  17 g Oral Daily  . pravastatin  80 mg Oral Daily  . sodium chloride flush  3 mL Intravenous Q12H   Continuous Infusions: . sodium chloride 50 mL/hr at 06/24/18 2044  . pantoprozole  (PROTONIX) infusion 8 mg/hr (06/24/18 2045)     LOS: 3 days    Time spent in minutes: 35    Debbe Odea, MD [facility] Pager: [email or web address] Password [facility] 06/25/2018, 3:41 PM

## 2018-06-26 ENCOUNTER — Encounter (HOSPITAL_COMMUNITY): Payer: Self-pay

## 2018-06-26 DIAGNOSIS — L8990 Pressure ulcer of unspecified site, unspecified stage: Secondary | ICD-10-CM

## 2018-06-26 DIAGNOSIS — E440 Moderate protein-calorie malnutrition: Secondary | ICD-10-CM

## 2018-06-26 LAB — BASIC METABOLIC PANEL
Anion gap: 6 (ref 5–15)
BUN: 39 mg/dL — ABNORMAL HIGH (ref 8–23)
CHLORIDE: 115 mmol/L — AB (ref 98–111)
CO2: 22 mmol/L (ref 22–32)
Calcium: 8.9 mg/dL (ref 8.9–10.3)
Creatinine, Ser: 1.63 mg/dL — ABNORMAL HIGH (ref 0.44–1.00)
GFR, EST AFRICAN AMERICAN: 35 mL/min — AB (ref >60)
GFR, EST NON AFRICAN AMERICAN: 30 mL/min — AB (ref >60)
Glucose, Bld: 121 mg/dL — ABNORMAL HIGH (ref 70–99)
Potassium: 3.6 mmol/L (ref 3.5–5.1)
SODIUM: 143 mmol/L (ref 135–145)

## 2018-06-26 LAB — CBC
HEMATOCRIT: 26.6 % — AB (ref 36.0–46.0)
HEMOGLOBIN: 7.9 g/dL — AB (ref 12.0–15.0)
MCH: 25.5 pg — ABNORMAL LOW (ref 26.0–34.0)
MCHC: 29.7 g/dL — ABNORMAL LOW (ref 30.0–36.0)
MCV: 85.8 fL (ref 78.0–100.0)
Platelets: 217 10*3/uL (ref 150–400)
RBC: 3.1 MIL/uL — ABNORMAL LOW (ref 3.87–5.11)
RDW: 19.4 % — AB (ref 11.5–15.5)
WBC: 6.1 10*3/uL (ref 4.0–10.5)

## 2018-06-26 LAB — HEMOGLOBIN AND HEMATOCRIT, BLOOD
HEMATOCRIT: 32.2 % — AB (ref 36.0–46.0)
Hemoglobin: 9.8 g/dL — ABNORMAL LOW (ref 12.0–15.0)

## 2018-06-26 LAB — PREPARE RBC (CROSSMATCH)

## 2018-06-26 LAB — GLUCOSE, CAPILLARY
Glucose-Capillary: 125 mg/dL — ABNORMAL HIGH (ref 70–99)
Glucose-Capillary: 122 mg/dL — ABNORMAL HIGH (ref 70–99)
Glucose-Capillary: 140 mg/dL — ABNORMAL HIGH (ref 70–99)
Glucose-Capillary: 129 mg/dL — ABNORMAL HIGH (ref 70–99)

## 2018-06-26 MED ORDER — SODIUM CHLORIDE 0.9% IV SOLUTION
Freq: Once | INTRAVENOUS | Status: AC
  Administered 2018-06-26: 16:00:00 via INTRAVENOUS

## 2018-06-26 NOTE — Care Management Note (Signed)
Case Management Note  Patient Details  Name: Kathleen [name] MRN: [id number] Date of Birth: 06/01/1946  Subjective/Objective:   Pt admitted with Hypotension due to hypovolemia                 Action/Plan: Plan to discharge home with [facility]   Expected Discharge Date:  (unknown)               Expected Discharge Plan:  [place]  In-House Referral:     Discharge planning Services  CM Consult  Post Acute Care Choice:    Choice offered to:  Patient  DME Arranged:    DME Agency:     HH Arranged:  RN, PT [place] Agency:  [place]  Status of Service:     If discussed at [place] of Stay Meetings, dates discussed:    Additional CommentsPurcell Mouton, RN 06/26/2018, 3:43 PM

## 2018-06-26 NOTE — Progress Notes (Signed)
Pt transferred from ICU. Pt placed on telemetry. VSS. Pt oriented to room and call bell placed within reach. Agree with previous RN's assessment. Will continue to monitor closely.

## 2018-06-26 NOTE — Progress Notes (Signed)
PROGRESS NOTE    Kathleen [name]   TGG:[id number]  DOB: 1946/03/15  DOA: 06/22/2018 PCP: Nolene Ebbs, MD   Brief Narrative:  Kathleen [name] is a 72 y/o female with has h/o A-fib on Eliquis, d CHF on Lasix, CAD/NSTEMI, DM2, CKD4 and chronic transaminitis who was recetnly admitted with melena and underwent an ablation of an AVM.  After discharge, she states, she continued to have black stools and presented back to the hospital for dizziness. She was found to have AKI and hypotention with SBP in 60s. She was given IVF and transfused 1 U PRBC. She was started on a Protonix infusion and admitted. GI was consulted.    Subjective: She has no complaints today.  Assessment & Plan:   Principal Problem:   Hypotension due to hypovolemia - IVF and PRBC given with improvement in BP and symptoms  Active Problems:    Acute on chronic renal failure - due to hypovolemia in setting of GI bleed and Lasix- cont slow IVF and follow Cr which is improving    Anemia due to GI blood loss/   Upper GI bleed - Hold Eliquis  -  small bowel video capsule swallowed today - given 1 U PRBC on 7/11 - Hb 7.9 today- I will give her another unit of blood today    Diabetes mellitus  - Lantus is on hold - place on sSI    Chronic diastolic CHF   - holding Lasix-      Atrial fibrillation, chronic  - cont to hold Eliquis - she is on Metoprolol 75 mg BID at home- currently 50 BID now    Transaminitis - this is chronic and mild- GI aware    DVT prophylaxis: SCDs Code Status: Full code Family Communication:  Disposition Plan: cont to follow renal function and Hb Consultants:   GI Procedures:   none Antimicrobials:  Anti-infectives (From admission, onward)   Start     Dose/Rate Route Frequency Ordered Stop   06/22/18 1600  ceFEPIme (MAXIPIME) 2 g in sodium chloride 0.9 % 100 mL IVPB     2 g 200 mL/hr over 30 Minutes Intravenous  Once 06/22/18 1545 06/22/18 1718   06/22/18 1515  metroNIDAZOLE  (FLAGYL) IVPB 500 mg     500 mg 100 mL/hr over 60 Minutes Intravenous  Once 06/22/18 1513 06/22/18 1638       Objective: Vitals:   06/25/18 2200 06/26/18 0005 06/26/18 0547 06/26/18 1127  BP: 133/64 129/62 124/76   Pulse: 91 94 79   Resp: (!) 22 18 20    Temp:  97.8 F (36.6 C) 98.3 F (36.8 C)   TempSrc:  Oral Oral   SpO2: 100% 100% 100% 99%  Weight:  97.3 kg (214 lb 9.6 oz) 97.9 kg (215 lb 14.4 oz)   Height:  5\' 4"  (1.626 m)      Intake/Output Summary (Last 24 hours) at 06/26/2018 1200 Last data filed at 06/26/2018 1000 Gross per 24 hour  Intake 3613.75 ml  Output 600 ml  Net 3013.75 ml   Filed Weights   06/22/18 1454 06/26/18 0005 06/26/18 0547  Weight: 90.7 kg (200 lb) 97.3 kg (214 lb 9.6 oz) 97.9 kg (215 lb 14.4 oz)    Examination: General exam: Appears comfortable  HEENT: PERRLA, oral mucosa moist, no sclera icterus or thrush Respiratory system: Clear to auscultation. Respiratory effort normal. Cardiovascular system: S1 & S2 heard, RRR.   Gastrointestinal system: Abdomen soft, non-tender, nondistended. Normal bowel sound. No organomegaly  Central nervous system: Alert and oriented. No focal neurological deficits. Extremities: No cyanosis, clubbing or edema Skin: No rashes or ulcers Psychiatry:  Mood & affect appropriate.   Data Reviewed: I have personally reviewed following labs and imaging studies  CBC: Recent Labs  Lab 06/23/18 2314 06/24/18 1105 06/25/18 0018 06/25/18 1119 06/26/18 0433  WBC 7.3 6.2 7.0 6.3 6.1  HGB 8.8* 8.5* 10.0* 8.6* 7.9*  HCT 27.8* 27.5* 33.2* 28.2* 26.6*  MCV 83.0 83.6 85.8 85.7 85.8  PLT 251 233 231 228 631   Basic Metabolic Panel: Recent Labs  Lab 06/22/18 1512 06/22/18 1516 06/23/18 0308 06/24/18 0807 06/26/18 0433  NA 141 138 141 141 143  K 3.9 3.8 3.5 3.5 3.6  CL 102 99 105 109 115*  CO2 27  --  26 24 22   GLUCOSE 139* 131* 97 87 121*  BUN 95* 87* 91* 69* 39*  CREATININE 3.36* 3.40* 2.89* 2.16* 1.63*  CALCIUM  10.0  --  9.3 9.2 8.9   GFR: Estimated Creatinine Clearance: 34.9 mL/min (A) (by C-G formula based on SCr of 1.63 mg/dL (H)). Liver Function Tests: Recent Labs  Lab 06/22/18 1512  AST 61*  ALT 71*  ALKPHOS 89  BILITOT 1.1  PROT 7.3  ALBUMIN 3.6   Recent Labs  Lab 06/22/18 1512  LIPASE 35   No results for input(s): AMMONIA in the last 168 hours. Coagulation Profile: Recent Labs  Lab 06/22/18 1512  INR 1.54   Cardiac Enzymes: No results for input(s): CKTOTAL, CKMB, CKMBINDEX, TROPONINI in the last 168 hours. BNP (last 3 results) No results for input(s): PROBNP in the last 8760 hours. HbA1C: No results for input(s): HGBA1C in the last 72 hours. CBG: Recent Labs  Lab 06/25/18 0742 06/25/18 1114 06/25/18 1554 06/25/18 2155 06/26/18 0738  GLUCAP 86 76 79 157* 125*   Lipid Profile: No results for input(s): CHOL, HDL, LDLCALC, TRIG, CHOLHDL, LDLDIRECT in the last 72 hours. Thyroid Function Tests: No results for input(s): TSH, T4TOTAL, FREET4, T3FREE, THYROIDAB in the last 72 hours. Anemia Panel: No results for input(s): VITAMINB12, FOLATE, FERRITIN, TIBC, IRON, RETICCTPCT in the last 72 hours. Urine analysis:    Component Value Date/Time   COLORURINE YELLOW 06/23/2018 [place] 06/23/2018 1734   LABSPEC 1.009 06/23/2018 1734   PHURINE 5.0 06/23/2018 1734   GLUCOSEU NEGATIVE 06/23/2018 1734   HGBUR NEGATIVE 06/23/2018 1734   BILIRUBINUR NEGATIVE 06/23/2018 1734   KETONESUR NEGATIVE 06/23/2018 1734   PROTEINUR NEGATIVE 06/23/2018 1734   UROBILINOGEN 1.0 08/15/2015 0915   NITRITE NEGATIVE 06/23/2018 1734   LEUKOCYTESUR TRACE (A) 06/23/2018 1734   Sepsis Labs: @LABRCNTIP (procalcitonin:4,lacticidven:4) ) Recent Results (from the past 240 hour(s))  Blood culture (routine x 2)     Status: None (Preliminary result)   Collection Time: 06/22/18  3:11 PM  Result Value Ref Range Status   Specimen Description   Final    BLOOD LEFT  ANTECUBITAL Performed at [facility], [place] [address], [place], [place] [address]    Special Requests   Final    BOTTLES DRAWN AEROBIC AND ANAEROBIC Blood Culture adequate volume Performed at [place] [address], [place], [place] [address]    Culture   Final    NO GROWTH 4 DAYS Performed at [facility], [place] [address] [place],  [address]    Report Status PENDING  Incomplete  Blood culture (routine x 2)     Status: None (Preliminary result)  Collection Time: 06/22/18  3:34 PM  Result Value Ref Range Status   Specimen Description   Final    BLOOD LEFT ANTECUBITAL Performed at [place] [address], [place], [place] [address]    Special Requests   Final    BOTTLES DRAWN AEROBIC AND ANAEROBIC Blood Culture adequate volume Performed at [place] [address] [place], [place] [address]    Culture   Final    NO GROWTH 4 DAYS Performed at [facility], [place] [address] [place], [place] [address]    Report Status PENDING  Incomplete  MRSA PCR Screening     Status: None   Collection Time: 06/22/18  9:09 PM  Result Value Ref Range Status   MRSA by PCR NEGATIVE NEGATIVE Final    Comment:        The GeneXpert MRSA Assay (FDA approved for NASAL specimens only), is one component of a comprehensive MRSA colonization surveillance program. It is not intended to diagnose MRSA infection nor to guide or monitor treatment for MRSA infections. Performed at [facility], [place] [address] [place], [place] [address]          Radiology Studies: No results found.    Scheduled Meds: . sodium chloride   Intravenous Once  . allopurinol  100 mg Oral Daily  . bisacodyl  10 mg Rectal Once  . insulin aspart  0-5 Units Subcutaneous QHS  . insulin aspart  0-9 Units Subcutaneous TID WC  . metoprolol tartrate  50 mg Oral BID  . mometasone-formoterol  2 puff  Inhalation BID  . polyethylene glycol  17 g Oral Daily  . pravastatin  80 mg Oral Daily  . sodium chloride flush  3 mL Intravenous Q12H   Continuous Infusions: . sodium chloride 75 mL/hr at 06/26/18 0621     LOS: 4 days    Time spent in minutes: 35    Debbe Odea, MD [facility] Pager: [email or web address] Password [email or web address] 06/26/2018, 12:00 PM

## 2018-06-26 NOTE — Progress Notes (Signed)
Dr. Hilarie Fredrickson discussed case with Dr. Benson Norway today, Dr. Benson Norway will resume care for this patient.  Ellouise Newer, PA-C [facility]

## 2018-06-26 NOTE — Progress Notes (Signed)
I am unable to read her capsule endoscopy as a result of persistent technical issues with the computer system.  Hopefully it can be read tomorrow.

## 2018-06-26 NOTE — Care Management Important Message (Signed)
Important Message  Patient Details  Name: DELOYCE WALTHERS MRN: [id number] Date of Birth: 1946/02/29   Medicare Important Message Given:  Yes    Kerin Salen 06/26/2018, 12:20 [place] Message  Patient Details  Name: AVONNE BERKERY MRN: [id number] Date of Birth: 26-Apr-1946   Medicare Important Message Given:  Yes    Kerin Salen 06/26/2018, 12:20 PM

## 2018-06-27 LAB — CBC
HCT: 31 % — ABNORMAL LOW (ref 36.0–46.0)
Hemoglobin: 9.6 g/dL — ABNORMAL LOW (ref 12.0–15.0)
MCH: 26.4 pg (ref 26.0–34.0)
MCHC: 31 g/dL (ref 30.0–36.0)
MCV: 85.2 fL (ref 78.0–100.0)
Platelets: 205 10*3/uL (ref 150–400)
RBC: 3.64 MIL/uL — ABNORMAL LOW (ref 3.87–5.11)
RDW: 19.4 % — ABNORMAL HIGH (ref 11.5–15.5)
WBC: 7.4 10*3/uL (ref 4.0–10.5)

## 2018-06-27 LAB — GLUCOSE, CAPILLARY
GLUCOSE-CAPILLARY: 104 mg/dL — AB (ref 70–99)
Glucose-Capillary: 108 mg/dL — ABNORMAL HIGH (ref 70–99)
Glucose-Capillary: 105 mg/dL — ABNORMAL HIGH (ref 70–99)
Glucose-Capillary: 102 mg/dL — ABNORMAL HIGH (ref 70–99)

## 2018-06-27 LAB — TYPE AND SCREEN
ABO/RH(D): O POS
Antibody Screen: NEGATIVE
UNIT DIVISION: 0

## 2018-06-27 LAB — CULTURE, BLOOD (ROUTINE X 2)
CULTURE: NO GROWTH
Culture: NO GROWTH
SPECIAL REQUESTS: ADEQUATE
Special Requests: ADEQUATE

## 2018-06-27 LAB — BPAM RBC
BLOOD PRODUCT EXPIRATION DATE: 201908112359
ISSUE DATE / TIME: 201907151515
UNIT TYPE AND RH: 5100

## 2018-06-27 LAB — BASIC METABOLIC PANEL
ANION GAP: 7 (ref 5–15)
BUN: 39 mg/dL — ABNORMAL HIGH (ref 8–23)
CO2: 23 mmol/L (ref 22–32)
Calcium: 9.5 mg/dL (ref 8.9–10.3)
Chloride: 113 mmol/L — ABNORMAL HIGH (ref 98–111)
Creatinine, Ser: 1.58 mg/dL — ABNORMAL HIGH (ref 0.44–1.00)
GFR calc Af Amer: 36 mL/min — ABNORMAL LOW (ref >60)
GFR, EST NON AFRICAN AMERICAN: 31 mL/min — AB (ref >60)
GLUCOSE: 151 mg/dL — AB (ref 70–99)
Potassium: 4.7 mmol/L (ref 3.5–5.1)
Sodium: 143 mmol/L (ref 135–145)

## 2018-06-27 MED ORDER — GUAIFENESIN-DM 100-10 MG/5ML PO SYRP
5.0000 mL | ORAL_SOLUTION | ORAL | Status: DC | PRN
  Administered 2018-06-27: 5 mL via ORAL
  Filled 2018-06-27: qty 10

## 2018-06-27 NOTE — Progress Notes (Signed)
PROGRESS NOTE    YUMALAY CIRCLE   ZSW:[id number]  DOB: 11-Apr-1946  DOA: 06/22/2018 PCP: Nolene Ebbs, MD   Brief Narrative:  Kathleen [name] is a 72 y/o female with has h/o A-fib on Eliquis, d CHF on Lasix, CAD/NSTEMI, DM2, CKD4 and chronic transaminitis who was recetnly admitted with melena and underwent an ablation of an AVM.  After discharge, she states, she continued to have black stools and presented back to the hospital for dizziness. She was found to have AKI and hypotention with SBP in 60s. She was given IVF and transfused 1 U PRBC. She was started on a Protonix infusion and admitted. GI was consulted.    Subjective: She has no complaints today. She has not had a BM in 2 days but overall feels well.   Assessment & Plan:   Principal Problem:   Hypotension due to hypovolemia - IVF and PRBC given with improvement in BP and symptoms  Active Problems:    Acute on chronic renal failure - due to hypovolemia in setting of GI bleed and Lasix- cont slow IVF and follow Cr which has improved to 1.58    Anemia due to GI blood loss/   Upper GI bleed - Hold Eliquis   -  small bowel video capsule swallowed> results shows small bowel AVMs which are currently not bleeding- I believe her bleeding has stopped since stopping the Eliquis as she has not had a bloody BM in 2 days - she will undergo an ablation most likely tomorrow - given 1 U PRBC on 7/11 and another unit on 7/15 - Hb 9.6 today    Diabetes mellitus  - Lantus is on hold -  on SSI- sugars stable    Chronic diastolic CHF   - holding Lasix due to GI bleeding- follow fluid status    Atrial fibrillation, chronic  - cont to hold Eliquis - she is on Metoprolol 75 mg BID at home- currently 50 BID now    Transaminitis - this is chronic and mild- GI aware    DVT prophylaxis: SCDs Code Status: Full code Family Communication:  Disposition Plan: cont to follow renal function and Hb Consultants:   GI Procedures:    none Antimicrobials:  Anti-infectives (From admission, onward)   Start     Dose/Rate Route Frequency Ordered Stop   06/22/18 1600  ceFEPIme (MAXIPIME) 2 g in sodium chloride 0.9 % 100 mL IVPB     2 g 200 mL/hr over 30 Minutes Intravenous  Once 06/22/18 1545 06/22/18 1718   06/22/18 1515  metroNIDAZOLE (FLAGYL) IVPB 500 mg     500 mg 100 mL/hr over 60 Minutes Intravenous  Once 06/22/18 1513 06/22/18 1638       Objective: Vitals:   06/26/18 1809 06/26/18 1956 06/26/18 2200 06/27/18 0557  BP: 108/68 123/78  (!) 151/96  Pulse: 88 88  (!) 105  Resp: 16 18  18   Temp: 98.1 F (36.7 C) 98.8 F (37.1 C)  98.3 F (36.8 C)  TempSrc: Oral Oral  Oral  SpO2: 98% 100% 98% 100%  Weight:    99.2 kg (218 lb 9.6 oz)  Height:        Intake/Output Summary (Last 24 hours) at 06/27/2018 1319 Last data filed at 06/27/2018 0600 Gross per 24 hour  Intake 838.67 ml  Output 400 ml  Net 438.67 ml   Filed Weights   06/26/18 0005 06/26/18 0547 06/27/18 0557  Weight: 97.3 kg (214 lb 9.6 oz) 97.9 kg (  215 lb 14.4 oz) 99.2 kg (218 lb 9.6 oz)    Examination: General exam: Appears comfortable  HEENT: PERRLA, oral mucosa moist, no sclera icterus or thrush Respiratory system: Clear to auscultation. Respiratory effort normal. Cardiovascular system: S1 & S2 heard, RRR.   Gastrointestinal system: Abdomen soft, non-tender, nondistended. Normal bowel sound. No organomegaly Central nervous system: Alert and oriented. No focal neurological deficits. Extremities: No cyanosis, clubbing or edema Skin: No rashes or ulcers Psychiatry:  Mood & affect appropriate.   Data Reviewed: I have personally reviewed following labs and imaging studies  CBC: Recent Labs  Lab 06/23/18 2314 06/24/18 1105 06/25/18 0018 06/25/18 1119 06/26/18 0433 06/26/18 2010  WBC 7.3 6.2 7.0 6.3 6.1  --   HGB 8.8* 8.5* 10.0* 8.6* 7.9* 9.8*  HCT 27.8* 27.5* 33.2* 28.2* 26.6* 32.2*  MCV 83.0 83.6 85.8 85.7 85.8  --   PLT 251 233  231 228 217  --    Basic Metabolic Panel: Recent Labs  Lab 06/22/18 1512 06/22/18 1516 06/23/18 0308 06/24/18 0807 06/26/18 0433  NA 141 138 141 141 143  K 3.9 3.8 3.5 3.5 3.6  CL 102 99 105 109 115*  CO2 27  --  26 24 22   GLUCOSE 139* 131* 97 87 121*  BUN 95* 87* 91* 69* 39*  CREATININE 3.36* 3.40* 2.89* 2.16* 1.63*  CALCIUM 10.0  --  9.3 9.2 8.9   GFR: Estimated Creatinine Clearance: 35.2 mL/min (A) (by C-G formula based on SCr of 1.63 mg/dL (H)). Liver Function Tests: Recent Labs  Lab 06/22/18 1512  AST 61*  ALT 71*  ALKPHOS 89  BILITOT 1.1  PROT 7.3  ALBUMIN 3.6   Recent Labs  Lab 06/22/18 1512  LIPASE 35   No results for input(s): AMMONIA in the last 168 hours. Coagulation Profile: Recent Labs  Lab 06/22/18 1512  INR 1.54   Cardiac Enzymes: No results for input(s): CKTOTAL, CKMB, CKMBINDEX, TROPONINI in the last 168 hours. BNP (last 3 results) No results for input(s): PROBNP in the last 8760 hours. HbA1C: No results for input(s): HGBA1C in the last 72 hours. CBG: Recent Labs  Lab 06/26/18 1207 06/26/18 1627 06/26/18 2122 06/27/18 0756 06/27/18 1152  GLUCAP 122* 140* 129* 108* 104*   Lipid Profile: No results for input(s): CHOL, HDL, LDLCALC, TRIG, CHOLHDL, LDLDIRECT in the last 72 hours. Thyroid Function Tests: No results for input(s): TSH, T4TOTAL, FREET4, T3FREE, THYROIDAB in the last 72 hours. Anemia Panel: No results for input(s): VITAMINB12, FOLATE, FERRITIN, TIBC, IRON, RETICCTPCT in the last 72 hours. Urine analysis:    Component Value Date/Time   COLORURINE YELLOW 06/23/2018 [place] 06/23/2018 1734   LABSPEC 1.009 06/23/2018 1734   PHURINE 5.0 06/23/2018 1734   GLUCOSEU NEGATIVE 06/23/2018 1734   HGBUR NEGATIVE 06/23/2018 1734   BILIRUBINUR NEGATIVE 06/23/2018 1734   KETONESUR NEGATIVE 06/23/2018 1734   PROTEINUR NEGATIVE 06/23/2018 1734   UROBILINOGEN 1.0 08/15/2015 0915   NITRITE NEGATIVE 06/23/2018 1734    LEUKOCYTESUR TRACE (A) 06/23/2018 1734   Sepsis Labs: @LABRCNTIP (procalcitonin:4,lacticidven:4) ) Recent Results (from the past 240 hour(s))  Blood culture (routine x 2)     Status: None (Preliminary result)   Collection Time: 06/22/18  3:11 PM  Result Value Ref Range Status   Specimen Description   Final    BLOOD LEFT ANTECUBITAL Performed at [facility], [place] [address] [place], [place] [address]    Special Requests   Final    BOTTLES DRAWN  AEROBIC AND ANAEROBIC Blood Culture adequate volume Performed at [place] [address], [place], [place] [address]    Culture   Final    NO GROWTH 4 DAYS Performed at [facility], [place] [address], [place], [place] [address]    Report Status PENDING  Incomplete  Blood culture (routine x 2)     Status: None (Preliminary result)   Collection Time: 06/22/18  3:34 PM  Result Value Ref Range Status   Specimen Description   Final    BLOOD LEFT ANTECUBITAL Performed at [place] [address] [place], [place] [address]    Special Requests   Final    BOTTLES DRAWN AEROBIC AND ANAEROBIC Blood Culture adequate volume Performed at [place] [address], [place], [place] [address]    Culture   Final    NO GROWTH 4 DAYS Performed at [facility], [place] [address], [place], [place] [address]    Report Status PENDING  Incomplete  MRSA PCR Screening     Status: None   Collection Time: 06/22/18  9:09 PM  Result Value Ref Range Status   MRSA by PCR NEGATIVE NEGATIVE Final    Comment:        The GeneXpert MRSA Assay (FDA approved for NASAL specimens only), is one component of a comprehensive MRSA colonization surveillance program. It is not intended to diagnose MRSA infection nor to guide or monitor treatment for MRSA infections. Performed at [facility], [place] [address], [place], [place] [address]           Radiology Studies: No results found.    Scheduled Meds: . allopurinol  100 mg Oral Daily  . bisacodyl  10 mg Rectal Once  . insulin aspart  0-5 Units Subcutaneous QHS  . insulin aspart  0-9 Units Subcutaneous TID WC  . metoprolol tartrate  50 mg Oral BID  . mometasone-formoterol  2 puff Inhalation BID  . polyethylene glycol  17 g Oral Daily  . pravastatin  80 mg Oral Daily  . sodium chloride flush  3 mL Intravenous Q12H   Continuous Infusions:    LOS: 5 days    Time spent in minutes: 35    Debbe Odea, MD [facility] Pager: [email or web address] Password [email or web address] 06/27/2018, 1:19 PM

## 2018-06-27 NOTE — H&P (View-Only) (Signed)
The capsule endoscopy showed several nonbleeding proximal small bowel AVMs.  There was no evidence of any bleeding during the procedure, however, it will be prudent to ablate those vessels.  Scheduling is an issue and the hope is to place the patient on the schedule tomorrow.

## 2018-06-27 NOTE — Progress Notes (Signed)
The capsule endoscopy showed several nonbleeding proximal small bowel AVMs.  There was no evidence of any bleeding during the procedure, however, it will be prudent to ablate those vessels.  Scheduling is an issue and the hope is to place the patient on the schedule tomorrow.

## 2018-06-28 ENCOUNTER — Encounter (HOSPITAL_COMMUNITY)
Admission: EM | Disposition: A | Discharge status: Home / Self Care | Payer: Self-pay | Source: Home / Self Care | Attending: Internal Medicine

## 2018-06-28 ENCOUNTER — Inpatient Hospital Stay (HOSPITAL_COMMUNITY): Payer: Medicare Other | Admitting: Certified Registered Nurse Anesthetist

## 2018-06-28 ENCOUNTER — Encounter (HOSPITAL_COMMUNITY): Payer: Self-pay

## 2018-06-28 DIAGNOSIS — I9589 Other hypotension: Secondary | ICD-10-CM

## 2018-06-28 DIAGNOSIS — E861 Hypovolemia: Secondary | ICD-10-CM

## 2018-06-28 HISTORY — PX: ENTEROSCOPY: SHX5533

## 2018-06-28 HISTORY — PX: HOT HEMOSTASIS: SHX5433

## 2018-06-28 LAB — BASIC METABOLIC PANEL
ANION GAP: 9 (ref 5–15)
BUN: 41 mg/dL — ABNORMAL HIGH (ref 8–23)
CALCIUM: 9.7 mg/dL (ref 8.9–10.3)
CO2: 23 mmol/L (ref 22–32)
Chloride: 111 mmol/L (ref 98–111)
Creatinine, Ser: 1.52 mg/dL — ABNORMAL HIGH (ref 0.44–1.00)
GFR, EST AFRICAN AMERICAN: 38 mL/min — AB (ref >60)
GFR, EST NON AFRICAN AMERICAN: 33 mL/min — AB (ref >60)
Glucose, Bld: 114 mg/dL — ABNORMAL HIGH (ref 70–99)
POTASSIUM: 4.4 mmol/L (ref 3.5–5.1)
Sodium: 143 mmol/L (ref 135–145)

## 2018-06-28 LAB — CBC
HEMATOCRIT: 31 % — AB (ref 36.0–46.0)
HEMOGLOBIN: 9.5 g/dL — AB (ref 12.0–15.0)
MCH: 26 pg (ref 26.0–34.0)
MCHC: 30.6 g/dL (ref 30.0–36.0)
MCV: 84.7 fL (ref 78.0–100.0)
Platelets: 169 10*3/uL (ref 150–400)
RBC: 3.66 MIL/uL — AB (ref 3.87–5.11)
RDW: 19.6 % — ABNORMAL HIGH (ref 11.5–15.5)
WBC: 7.7 10*3/uL (ref 4.0–10.5)

## 2018-06-28 LAB — GLUCOSE, CAPILLARY
GLUCOSE-CAPILLARY: 97 mg/dL (ref 70–99)
GLUCOSE-CAPILLARY: 104 mg/dL — AB (ref 70–99)
GLUCOSE-CAPILLARY: 103 mg/dL — AB (ref 70–99)
GLUCOSE-CAPILLARY: 125 mg/dL — AB (ref 70–99)

## 2018-06-28 SURGERY — ENTEROSCOPY
Anesthesia: Monitor Anesthesia Care

## 2018-06-28 MED ORDER — PROPOFOL 10 MG/ML IV BOLUS
INTRAVENOUS | Status: AC
  Filled 2018-06-28: qty 60

## 2018-06-28 MED ORDER — PROPOFOL 500 MG/50ML IV EMUL
INTRAVENOUS | Status: DC | PRN
  Administered 2018-06-28: 100 ug/kg/min via INTRAVENOUS

## 2018-06-28 MED ORDER — PROPOFOL 10 MG/ML IV BOLUS
INTRAVENOUS | Status: DC | PRN
  Administered 2018-06-28: 20 mg via INTRAVENOUS
  Administered 2018-06-28 (×2): 30 mg via INTRAVENOUS

## 2018-06-28 MED ORDER — EPHEDRINE SULFATE-NACL 50-0.9 MG/10ML-% IV SOSY
PREFILLED_SYRINGE | INTRAVENOUS | Status: DC | PRN
  Administered 2018-06-28: 20 mg via INTRAVENOUS
  Administered 2018-06-28: 15 mg via INTRAVENOUS

## 2018-06-28 MED ORDER — SODIUM CHLORIDE 0.9 % IV SOLN
INTRAVENOUS | Status: DC
  Administered 2018-06-28: 12:00:00 via INTRAVENOUS

## 2018-06-28 MED ORDER — PHENYLEPHRINE 40 MCG/ML (10ML) SYRINGE FOR IV PUSH (FOR BLOOD PRESSURE SUPPORT)
PREFILLED_SYRINGE | INTRAVENOUS | Status: DC | PRN
  Administered 2018-06-28 (×2): 120 ug via INTRAVENOUS
  Administered 2018-06-28 (×2): 80 ug via INTRAVENOUS

## 2018-06-28 NOTE — Interval H&P Note (Signed)
History and Physical Interval Note:  06/28/2018 12:42 PM  Kathleen [name]  has presented today for surgery, with the diagnosis of Enteroscopy  The various methods of treatment have been discussed with the patient and family. After consideration of risks, benefits and other options for treatment, the patient has consented to  Procedure(s): ENTEROSCOPY (N/A) as a surgical intervention .  The patient's history has been reviewed, patient examined, no change in status, stable for surgery.  I have reviewed the patient's chart and labs.  Questions were answered to the patient's satisfaction.     Sicily Zaragoza D

## 2018-06-28 NOTE — Anesthesia Preprocedure Evaluation (Addendum)
Anesthesia Evaluation  Patient identified by MRN, date of birth, ID band Patient awake    Reviewed: Allergy & Precautions, NPO status , Patient's Chart, lab work & pertinent test results  Airway Mallampati: II  TM Distance: >3 FB Neck ROM: Full    Dental no notable dental hx.    Pulmonary shortness of breath, former smoker,    Pulmonary exam normal breath sounds clear to auscultation       Cardiovascular hypertension, + CAD, + Past MI, + Cardiac Stents, + Peripheral Vascular Disease and +CHF  Normal cardiovascular exam Rhythm:Regular Rate:Normal  Left ventricle:  The cavity size was normal. Systolic function was normal. The estimated ejection fraction was in the range of 60% to 65%. Wall motion was normal; there were no regional wall motion abnormalities. Normal sinus rhythm was absent. The study was not technically sufficient to allow evaluation of LV diastolic dysfunction due to atrial fibrillation. Doppler parameters are consistent with high ventricular filling pressure.  ------------------------------------------------------------------- Aortic valve:   Trileaflet; mildly thickened, mildly calcified leaflets. Mobility was not restricted.  Doppler:  Transvalvular velocity was within the normal range. There was no stenosis. There was mild regurgitation.   Neuro/Psych negative neurological ROS  negative psych ROS   GI/Hepatic Neg liver ROS, GERD  ,  Endo/Other  diabetes  Renal/GU Renal disease  negative genitourinary   Musculoskeletal negative musculoskeletal ROS (+)   Abdominal   Peds  Hematology  (+) anemia ,   Anesthesia Other Findings   Reproductive/Obstetrics negative OB ROS                             Anesthesia Physical  Anesthesia Plan  ASA: III  Anesthesia Plan: MAC   Post-op Pain Management:    Induction: Intravenous  PONV Risk Score and Plan: 0 and Treatment may  vary due to age or medical condition  Airway Management Planned: Simple Face Mask  Additional Equipment:   Intra-op Plan:   Post-operative Plan:   Informed Consent: I have reviewed the patients History and Physical, chart, labs and discussed the procedure including the risks, benefits and alternatives for the proposed anesthesia with the patient or authorized representative who has indicated his/her understanding and acceptance.   Dental advisory given  Plan Discussed with: CRNA  Anesthesia Plan Comments:         Anesthesia Quick Evaluation

## 2018-06-28 NOTE — Op Note (Signed)
[facility] Patient Name: Kathleen [name] Procedure Date: 06/28/2018 MRN: [id number] Attending MD: Carol Ada , MD Date of Birth: 1946-03-20 CSN: [id number] Age: 72 Admit Type: Inpatient Procedure:                Small bowel enteroscopy Indications:              Recurrent bleeding in the small bowel Providers:                Carol Ada, MD, Debi Mays, RN, Tinnie Gens,                            Technician, [place] Alday CRNA, CRNA Referring MD:              Medicines:                Propofol per Anesthesia Complications:            No immediate complications. Estimated Blood Loss:     Estimated blood loss: none. Procedure:                Pre-Anesthesia Assessment:                           - Prior to the procedure, a History and Physical                            was performed, and patient medications and                            allergies were reviewed. The patient's tolerance of                            previous anesthesia was also reviewed. The risks                            and benefits of the procedure and the sedation                            options and risks were discussed with the patient.                            All questions were answered, and informed consent                            was obtained. Prior Anticoagulants: The patient has                            taken Eliquis (apixaban), last dose was 6 days                            prior to procedure. ASA Grade Assessment: III - A                            patient with severe systemic disease. After  reviewing the risks and benefits, the patient was                            deemed in satisfactory condition to undergo the                            procedure.                           - Sedation was administered by an anesthesia                            professional. Deep sedation was attained.                           After obtaining informed consent,  the endoscope was                            passed under direct vision. Throughout the                            procedure, the patient's blood pressure, pulse, and                            oxygen saturations were monitored continuously. The                            EC-[id number] ([id number]) scope was introduced through                            the mouth and advanced to the proximal jejunum. The                            small bowel enteroscopy was accomplished without                            difficulty. The patient tolerated the procedure                            well. Scope In: Scope Out: Findings:      The esophagus was normal.      Hematin (altered blood/coffee-ground-like material) was found in the       gastric fundus.      Few non-bleeding superficial duodenal ulcers with no stigmata of       bleeding were found in the duodenal bulb. The largest lesion was 5 mm in       largest dimension.      Multiple angiodysplastic lesions with bleeding were found in the       proximal jejunum. Coagulation for hemostasis using monopolar probe was       successful. Estimated blood loss: none.      In the gastric lumen there was evidence of hematin, but no overt source       of bleeding was identified. In the duodenal bulb, multiple superfical       nonbleeding ulcerations were found, which were not visible with the VCE.  An actively bleeding D2 AVM was identified and it was successfully       ablated with APC. Multiple other AVMs were found in the small bowel and       abalted with APC. Two detailed passes, deep into the proximal jejunum,       were performed. Impression:               - Normal esophagus.                           - Hematin (altered blood/coffee-ground-like                            material) in the gastric fundus.                           - Multiple non-bleeding duodenal ulcers with no                            stigmata of bleeding.                            - Multiple bleeding angiodysplastic lesions in the                            jejunum. Treated with a monopolar probe.                           - No specimens collected. Recommendation:           - Return patient to hospital ward for ongoing care.                           - Resume regular diet.                           - Follow HGB and transfuse as necessary.                           - Follow up in the office in 2-3 weeks. Procedure Code(s):        --- Professional ---                           [phone], Small intestinal endoscopy, enteroscopy                            beyond second portion of duodenum, not including                            ileum; with control of bleeding (eg, injection,                            bipolar cautery, unipolar cautery, laser, heater                            probe, stapler, plasma coagulator) Diagnosis Code(s):        --- Professional ---  K92.2, Gastrointestinal hemorrhage, unspecified                           K26.9, Duodenal ulcer, unspecified as acute or                            chronic, without hemorrhage or perforation                           K55.21, Angiodysplasia of colon with hemorrhage CPT copyright 2017 [employer]. All rights reserved. The codes documented in this report are preliminary and upon coder review may  be revised to meet current compliance requirements. Carol Ada, MD Carol Ada, MD 06/28/2018 1:26:27 PM This report has been signed electronically. Number of Addenda: 0

## 2018-06-28 NOTE — Progress Notes (Signed)
PROGRESS NOTE    Kathleen [name]   RXV:[id number]  DOB: 1946/09/05  DOA: 06/22/2018 PCP: Nolene Ebbs, MD   Brief Narrative:  Kathleen [name] is a 72 y/o female with has h/o A-fib on Eliquis, d CHF on Lasix, CAD/NSTEMI, DM2, CKD4 and chronic transaminitis who was recetnly admitted with melena and underwent an ablation of an AVM.  After discharge, she states, she continued to have black stools and presented back to the hospital for dizziness. She was found to have AKI and hypotention with SBP in 60s. She was given IVF and transfused 1 U PRBC. She was started on a Protonix infusion and admitted. GI was consulted.    Subjective: Seen after the procedure.  No acute complaints.  Sleepy and drowsy.  No further bleeding.  Assessment & Plan: 1.  Upper GI bleed with melena. Acute blood loss anemia. GI consulted, underwent capsule endoscopy which showed small bowel AVM not actively bleeding. Underwent small bowel enteroscopy with APC of duodenal active bleeding AVM. On Eliquis at home, on hold. Baseline hemoglobin 12-13, presented with hemoglobin of 8.3 now 9.5. GI recommends to monitor overnight. We will discuss with them regarding resumption of Eliquis.  2.  Chronic persistent A. fib, currently rate controlled On Lopressor at home. CHADS-VASc Score 5. Eliquis currently on hold due to GI bleed. D.w cardiology, recommended to continue current dose, when possible from GI perspective.  3.  Essential hypertension. Chronic diastolic CHF. CAD. Hypotension Continue Lopressor  Holding Lasix while the patient,  Hold isosorbide and hydralazine due to soft blood pressure. No IV fluids as the patient appears euvolemic at present.  4.  Type 2 diabetes mellitus. Last month when A1c checked in our system was 6.2. Currently holding Lantus scale insulin.  5.  Elevated LFT. Etiology not clear. Ultrasound liver negative. Serological work-up negative   DVT prophylaxis: SCDs Code Status:  Full code Family Communication:  Disposition Plan: cont to follow renal function and Hb Consultants:   GI Procedures:   none Antimicrobials:  Anti-infectives (From admission, onward)   Start     Dose/Rate Route Frequency Ordered Stop   06/22/18 1600  ceFEPIme (MAXIPIME) 2 g in sodium chloride 0.9 % 100 mL IVPB     2 g 200 mL/hr over 30 Minutes Intravenous  Once 06/22/18 1545 06/22/18 1718   06/22/18 1515  metroNIDAZOLE (FLAGYL) IVPB 500 mg     500 mg 100 mL/hr over 60 Minutes Intravenous  Once 06/22/18 1513 06/22/18 1638     Objective: Vitals:   06/28/18 1340 06/28/18 1350 06/28/18 1419 06/28/18 1422  BP: (!) 138/43 140/86 125/79   Pulse: (!) 118 90 75   Resp: (!) 23 (!) 26 14   Temp:    97.7 F (36.5 C)  TempSrc:    Oral  SpO2: 95% 99% 100%   Weight:      Height:        Intake/Output Summary (Last 24 hours) at 06/28/2018 1650 Last data filed at 06/28/2018 1320 Gross per 24 hour  Intake 400 ml  Output 1 ml  Net 399 ml   Filed Weights   06/27/18 0557 06/28/18 0620 06/28/18 1155  Weight: 99.2 kg (218 lb 9.6 oz) 99.5 kg (219 lb 4.8 oz) 99.3 kg (219 lb)    Examination: General exam: Appears comfortable  HEENT: PERRLA, oral mucosa moist, no sclera icterus or thrush Respiratory system: Clear to auscultation. Respiratory effort normal. Cardiovascular system: S1 & S2 heard, RRR.   Gastrointestinal system: Abdomen soft,  non-tender, nondistended. Normal bowel sound. No organomegaly Central nervous system: Alert and oriented. No focal neurological deficits. Extremities: No cyanosis, clubbing or edema Skin: No rashes or ulcers Psychiatry:  Mood & affect appropriate.   Data Reviewed: I have personally reviewed following labs and imaging studies  CBC: Recent Labs  Lab 06/25/18 0018 06/25/18 1119 06/26/18 0433 06/26/18 2010 06/27/18 1434 06/28/18 0412  WBC 7.0 6.3 6.1  --  7.4 7.7  HGB 10.0* 8.6* 7.9* 9.8* 9.6* 9.5*  HCT 33.2* 28.2* 26.6* 32.2* 31.0* 31.0*  MCV  85.8 85.7 85.8  --  85.2 84.7  PLT 231 228 217  --  205 423   Basic Metabolic Panel: Recent Labs  Lab 06/23/18 0308 06/24/18 0807 06/26/18 0433 06/27/18 1434 06/28/18 0412  NA 141 141 143 143 143  K 3.5 3.5 3.6 4.7 4.4  CL 105 109 115* 113* 111  CO2 26 24 22 23 23   GLUCOSE 97 87 121* 151* 114*  BUN 91* 69* 39* 39* 41*  CREATININE 2.89* 2.16* 1.63* 1.58* 1.52*  CALCIUM 9.3 9.2 8.9 9.5 9.7   GFR: Estimated Creatinine Clearance: 37.7 mL/min (A) (by C-G formula based on SCr of 1.52 mg/dL (H)). Liver Function Tests: Recent Labs  Lab 06/22/18 1512  AST 61*  ALT 71*  ALKPHOS 89  BILITOT 1.1  PROT 7.3  ALBUMIN 3.6   Recent Labs  Lab 06/22/18 1512  LIPASE 35   No results for input(s): AMMONIA in the last 168 hours. Coagulation Profile: Recent Labs  Lab 06/22/18 1512  INR 1.54   Cardiac Enzymes: No results for input(s): CKTOTAL, CKMB, CKMBINDEX, TROPONINI in the last 168 hours. BNP (last 3 results) No results for input(s): PROBNP in the last 8760 hours. HbA1C: No results for input(s): HGBA1C in the last 72 hours. CBG: Recent Labs  Lab 06/27/18 1152 06/27/18 1702 06/27/18 2015 06/27/18 2219 06/28/18 0737  GLUCAP 104* 105* 97 102* 104*   Lipid Profile: No results for input(s): CHOL, HDL, LDLCALC, TRIG, CHOLHDL, LDLDIRECT in the last 72 hours. Thyroid Function Tests: No results for input(s): TSH, T4TOTAL, FREET4, T3FREE, THYROIDAB in the last 72 hours. Anemia Panel: No results for input(s): VITAMINB12, FOLATE, FERRITIN, TIBC, IRON, RETICCTPCT in the last 72 hours. Urine analysis:    Component Value Date/Time   COLORURINE YELLOW 06/23/2018 [place] 06/23/2018 1734   LABSPEC 1.009 06/23/2018 1734   PHURINE 5.0 06/23/2018 1734   GLUCOSEU NEGATIVE 06/23/2018 1734   HGBUR NEGATIVE 06/23/2018 1734   BILIRUBINUR NEGATIVE 06/23/2018 1734   KETONESUR NEGATIVE 06/23/2018 1734   PROTEINUR NEGATIVE 06/23/2018 1734   UROBILINOGEN 1.0 08/15/2015  0915   NITRITE NEGATIVE 06/23/2018 1734   LEUKOCYTESUR TRACE (A) 06/23/2018 1734   Sepsis Labs: @LABRCNTIP (procalcitonin:4,lacticidven:4) ) Recent Results (from the past 240 hour(s))  Blood culture (routine x 2)     Status: None   Collection Time: 06/22/18  3:11 PM  Result Value Ref Range Status   Specimen Description   Final    BLOOD LEFT ANTECUBITAL Performed at [facility], [place] [address], [place], [place] [address]    Special Requests   Final    BOTTLES DRAWN AEROBIC AND ANAEROBIC Blood Culture adequate volume Performed at [facility] [address] [place], [place] [address]    Culture   Final    NO GROWTH 5 DAYS Performed at [facility], [place] [address] [place], [place] [address]    Report Status 06/27/2018 FINAL  Final  Blood culture (routine x 2)     Status: None   Collection Time: 06/22/18  3:34 PM  Result Value Ref Range Status   Specimen Description   Final    BLOOD LEFT ANTECUBITAL Performed at [place] [address] [place], [place] [address]    Special Requests   Final    BOTTLES DRAWN AEROBIC AND ANAEROBIC Blood Culture adequate volume Performed at [place] [address], [place], [place] [address]    Culture   Final    NO GROWTH 5 DAYS Performed at [facility], [place] [address] [place], [place] [address]    Report Status 06/27/2018 FINAL  Final  MRSA PCR Screening     Status: None   Collection Time: 06/22/18  9:09 PM  Result Value Ref Range Status   MRSA by PCR NEGATIVE NEGATIVE Final    Comment:        The GeneXpert MRSA Assay (FDA approved for NASAL specimens only), is one component of a comprehensive MRSA colonization surveillance program. It is not intended to diagnose MRSA infection nor to guide or monitor treatment for MRSA infections. Performed at [facility], [place] [address], [place], [place] [address]           Radiology Studies: No results found.    Scheduled Meds: . allopurinol  100 mg Oral Daily  . bisacodyl  10 mg Rectal Once  . insulin aspart  0-5 Units Subcutaneous QHS  . insulin aspart  0-9 Units Subcutaneous TID WC  . metoprolol tartrate  50 mg Oral BID  . mometasone-formoterol  2 puff Inhalation BID  . polyethylene glycol  17 g Oral Daily  . pravastatin  80 mg Oral Daily  . sodium chloride flush  3 mL Intravenous Q12H   Continuous Infusions:    LOS: 6 days    Time spent in minutes: [place], MD [facility] Pager: [email or web address] Password [email or web address] 06/28/2018, 4:50 PM

## 2018-06-28 NOTE — Transfer of Care (Signed)
Immediate Anesthesia Transfer of Care Note  Patient: Kathleen [name]  Procedure(s) Performed: ENTEROSCOPY (N/A ) HOT HEMOSTASIS (ARGON PLASMA COAGULATION/BICAP) (N/A )  Patient Location: PACU  Anesthesia Type:MAC  Level of Consciousness: sedated  Airway & Oxygen Therapy: Patient Spontanous Breathing and Patient connected to nasal cannula oxygen  Post-op Assessment: Report given to RN and Post -op Vital signs reviewed and stable  Post vital signs: Reviewed and stable  Last Vitals:  Vitals Value Taken Time  BP 150/103 06/28/2018  1:23 PM  Temp 36.6 C 06/28/2018  1:23 PM  Pulse 130 06/28/2018  1:23 PM  Resp 16 06/28/2018  1:23 PM  SpO2 90 % 06/28/2018  1:23 PM    Last Pain:  Vitals:   06/28/18 1323  TempSrc: Oral  PainSc:          Complications: No apparent anesthesia complications

## 2018-06-28 NOTE — Anesthesia Postprocedure Evaluation (Signed)
Anesthesia Post Note  Patient: Kathleen [name]  Procedure(s) Performed: ENTEROSCOPY (N/A ) HOT HEMOSTASIS (ARGON PLASMA COAGULATION/BICAP) (N/A )     Patient location during evaluation: PACU Anesthesia Type: MAC Level of consciousness: awake and alert Pain management: pain level controlled Vital Signs Assessment: post-procedure vital signs reviewed and stable Respiratory status: spontaneous breathing Cardiovascular status: stable Anesthetic complications: no    Last Vitals:  Vitals:   06/28/18 1419 06/28/18 1422  BP: 125/79   Pulse: 75   Resp: 14   Temp:  36.5 C  SpO2: 100%     Last Pain:  Vitals:   06/28/18 1422  TempSrc: Oral  PainSc:                  Nolon Nations

## 2018-06-29 LAB — CBC
HCT: 31 % — ABNORMAL LOW (ref 36.0–46.0)
HEMOGLOBIN: 9.5 g/dL — AB (ref 12.0–15.0)
MCH: 26.1 pg (ref 26.0–34.0)
MCHC: 30.6 g/dL (ref 30.0–36.0)
MCV: 85.2 fL (ref 78.0–100.0)
PLATELETS: 176 10*3/uL (ref 150–400)
RBC: 3.64 MIL/uL — AB (ref 3.87–5.11)
RDW: 19.9 % — ABNORMAL HIGH (ref 11.5–15.5)
WBC: 8.5 10*3/uL (ref 4.0–10.5)

## 2018-06-29 LAB — BASIC METABOLIC PANEL
ANION GAP: 10 (ref 5–15)
BUN: 43 mg/dL — ABNORMAL HIGH (ref 8–23)
CO2: 20 mmol/L — AB (ref 22–32)
Calcium: 9.5 mg/dL (ref 8.9–10.3)
Chloride: 112 mmol/L — ABNORMAL HIGH (ref 98–111)
Creatinine, Ser: 1.66 mg/dL — ABNORMAL HIGH (ref 0.44–1.00)
GFR, EST AFRICAN AMERICAN: 34 mL/min — AB (ref >60)
GFR, EST NON AFRICAN AMERICAN: 30 mL/min — AB (ref >60)
Glucose, Bld: 110 mg/dL — ABNORMAL HIGH (ref 70–99)
Potassium: 4.6 mmol/L (ref 3.5–5.1)
SODIUM: 142 mmol/L (ref 135–145)

## 2018-06-29 LAB — GLUCOSE, CAPILLARY
GLUCOSE-CAPILLARY: 98 mg/dL (ref 70–99)
GLUCOSE-CAPILLARY: 144 mg/dL — AB (ref 70–99)

## 2018-06-29 MED ORDER — APIXABAN 5 MG PO TABS: 5.0000 mg | ORAL_TABLET | Freq: Two times a day (BID) | ORAL | 1 refills | Status: DC

## 2018-06-29 MED ORDER — POLYETHYLENE GLYCOL 3350 17 G PO PACK: 17.0000 g | PACK | Freq: Every day | ORAL | 0 refills | Status: DC

## 2018-06-29 MED ORDER — METOPROLOL TARTRATE 50 MG PO TABS: 50.0000 mg | ORAL_TABLET | Freq: Two times a day (BID) | ORAL | 0 refills | Status: DC

## 2018-06-29 NOTE — Discharge Instructions (Signed)
Start taking apixaban on Monday 07/03/2018

## 2018-07-05 NOTE — Discharge Summary (Signed)
[facility] Discharge Summary   Patient: Kathleen [name] EXB:[id number]   PCP: Nolene Ebbs, MD DOB: 1946/06/30   Date of admission: 06/22/2018   Date of discharge: 06/29/2018    Discharge Diagnoses:  Principal Problem:   Hypotension due to hypovolemia Active Problems:   Diabetes mellitus (HCC)   Non-ST elevation MI (NSTEMI) (HCC)   Chronic diastolic CHF (congestive heart failure) (HCC)   Melena   Atrial fibrillation, chronic (HCC)   Upper GI bleed   Duodenitis   Acute metabolic encephalopathy   Acute on chronic renal failure (HCC)   Transaminitis   Anemia due to GI blood loss   Epigastric pain   Decreased oral intake   Hypovolemic shock (HCC)   Malnutrition of moderate degree   Pressure injury of skin   Admitted From: home Disposition:  home  Recommendations for Outpatient Follow-up:  1. Follow-up with PCP in 1 week  Follow-up Information    Nolene Ebbs, MD. Schedule an appointment as soon as possible for a visit in 1 week(s).   Specialty:  Internal Medicine Contact information: [address] [place] [address] [phone]        Carol Ada, MD. Schedule an appointment as soon as possible for a visit in 1 month(s).   Specialty:  Gastroenterology Contact information: [place], [address] [place] [address] [phone]          Diet recommendation: Cardiac diet  Activity: The patient is advised to gradually reintroduce usual activities.  Discharge Condition: good  Code Status: Full code  History of present illness: As per the H and P dictated on admission, "Kathleen [name] is a 72 y.o. female with medical history significant of chronic Afib on Eliquis, diastolic CHF, CAD/NSTEMI, DM-2, CKD stage 4 with baseline Cr ~2, chronic transaminitis, recent hospitalization at [facility] for melena and anemia due to UGIB when she had GI evaluation through EGD finding a bleeding AVM treated with APC, now presented with upper abdominal pain  today. Pt stated she was doing well except for low appetite since her recent discharge. She was discharged on 6/26 and followed up with her PCP in 6/28 when she resumed Eliquis. Ever since her discharge, she continued to see melena intermittently but no abdominal pain or dizziness. Per patient, she was doing quite OK and moving around the house pretty well up to yesterday, however, she felt upper abdominal pain this morning and anything she tried to eat will hurt her stomach. Meanwhile she also felt dizziness and not quite mentally sharp as she was, therefore her daughters brought her to hospital for further evaluation. Other than feeling dizziness or lightheadedness and abdominal pain, she denies chest pain, SOB, PND, orthopnea, or palpitation."  Hospital Course:  Summary of her active problems in the hospital is as following. 1. Upper GI bleed with melena. Acute blood loss anemia. GI consulted, underwent capsule endoscopy which showed small bowel AVM not actively bleeding. Underwent small bowel enteroscopy with APC of duodenal active bleeding AVM. On Eliquis at home, on hold. Baseline hemoglobin 12-13, presented with hemoglobin of 8.3 now stable without any significant drop.  No further bloody bowel movement as well.  GI felt that the patient can be safely discharged from their perspective also recommended that Eliquis can be resumed on discharge in 3 days.  2. Chronic persistent A. fib, currently rate controlled On Lopressor at home. CHADS-VASc Score 5. Resume Eliquis on discharge, per my discussion with GI can start in 3 to 4 days. D.w cardiology,  recommended to continue current dose, when possible from GI perspective.  3. Essential hypertension. Chronic diastolic CHF. CAD. Hypotension Continue  home regimen on discharge  4. Type 2 diabetes mellitus. Last month when A1c checked in our system was 6.2. Resume home regimen on discharge  5. Elevated LFT. Etiology not  clear. Ultrasound liver negative. Serological work-up negative   All other chronic medical condition were stable during the hospitalization.  Patient was seen by physical therapy, who recommended home health, which was arranged by Education officer, museum and case Freight forwarder. On the day of the discharge the patient's vitals were stable , and no other acute medical condition were reported by patient. the patient was felt safe to be discharge at home with home health.  Consultants: gastroenterology  Procedures: Capsule endoscopy and push enteroscopy  DISCHARGE MEDICATION: Allergies as of 06/29/2018      Reactions   Penicillins Hives, Shortness Of Breath   Tolerates Ceftin, Ceftriaxone, and Cefepime Has patient had a PCN reaction causing immediate rash, facial/tongue/throat swelling, SOB or lightheadedness with hypotension: Y Has patient had a PCN reaction causing severe rash involving mucus membranes or skin necrosis: Y Has patient had a PCN reaction that required hospitalization: Y Has patient had a PCN reaction occurring within the last 10 years: N      Medication List    TAKE these medications   albuterol (2.5 MG/3ML) 0.083% nebulizer solution Commonly known as:  PROVENTIL Take 2.5 mg by nebulization every 6 (six) hours as needed for wheezing or shortness of breath.   albuterol 108 (90 Base) MCG/ACT inhaler Commonly known as:  PROVENTIL HFA;VENTOLIN HFA Inhale 1-2 puffs into the lungs every 4 (four) hours as needed for wheezing or shortness of breath.   allopurinol 100 MG tablet Commonly known as:  ZYLOPRIM Take 100 mg by mouth daily.   apixaban 5 MG Tabs tablet Commonly known as:  ELIQUIS Take 1 tablet (5 mg total) by mouth 2 (two) times daily. What changed:  how much to take   COLCRYS 0.6 MG tablet Generic drug:  colchicine Take 0.6 mg by mouth daily as needed (gout).   cyclobenzaprine 10 MG tablet Commonly known as:  FLEXERIL Take 10 mg by mouth 3 (three) times daily as needed  for muscle spasms.   docusate sodium 100 MG capsule Commonly known as:  COLACE Take 100 mg by mouth 2 (two) times daily as needed for mild constipation.   famotidine 20 MG tablet Commonly known as:  PEPCID Take 20 mg by mouth at bedtime.   furosemide 80 MG tablet Commonly known as:  LASIX Take 1 tablet (80 mg total) by mouth 2 (two) times daily.   guaiFENesin 600 MG 12 hr tablet Commonly known as:  MUCINEX Take 600 mg by mouth 2 (two) times daily as needed (congestion).   isosorbide-hydrALAZINE 20-37.5 MG tablet Commonly known as:  BIDIL Take 1 tablet by mouth 3 (three) times daily.   LANTUS SOLOSTAR 100 UNIT/ML Solostar Pen Generic drug:  Insulin Glargine INJ 10 UNITS  QAM   metoprolol tartrate 50 MG tablet Commonly known as:  LOPRESSOR Take 1 tablet (50 mg total) by mouth 2 (two) times daily. What changed:    medication strength  how much to take   pantoprazole 40 MG tablet Commonly known as:  PROTONIX Take 1 tablet (40 mg total) by mouth daily.   polyethylene glycol packet Commonly known as:  MIRALAX / GLYCOLAX Take 17 g by mouth daily.   pravastatin 80 MG tablet Commonly  known as:  PRAVACHOL Take 80 mg by mouth daily. What changed:  Another medication with the same name was removed. Continue taking this medication, and follow the directions you see here.   SYMBICORT 80-4.5 MCG/ACT inhaler Generic drug:  budesonide-formoterol Inhale 2 puffs into the lungs 2 (two) times daily.      Allergies  Allergen Reactions  . Penicillins Hives and Shortness Of Breath    Tolerates Ceftin, Ceftriaxone, and Cefepime Has patient had a PCN reaction causing immediate rash, facial/tongue/throat swelling, SOB or lightheadedness with hypotension: Y Has patient had a PCN reaction causing severe rash involving mucus membranes or skin necrosis: Y Has patient had a PCN reaction that required hospitalization: Y Has patient had a PCN reaction occurring within the last 10 years:  N    Discharge Instructions    Diet - low sodium heart healthy   Complete by:  As directed    Increase activity slowly   Complete by:  As directed      Discharge Exam: Filed Weights   06/28/18 0620 06/28/18 1155 06/29/18 0341  Weight: 99.5 kg (219 lb 4.8 oz) 99.3 kg (219 lb) 99.7 kg (219 lb 11.2 oz)   Vitals:   06/29/18 0420 06/29/18 0905  BP: 124/80   Pulse: (!) 104   Resp: 16   Temp: (!) 97.5 F (36.4 C)   SpO2: 100% 98%   General: Appear in no distress, no Rash; Oral Mucosa moist. Cardiovascular: S1 and S2 Present, no Murmur, no JVD Respiratory: Bilateral Air entry present and Clear to Auscultation, no Crackles, no wheezes Abdomen: Bowel Sound present, Soft and no tenderness Extremities: no Pedal edema, o calf tenderness Neurology: Grossly no focal neuro deficit.  The results of significant diagnostics from this hospitalization (including imaging, microbiology, ancillary and laboratory) are listed below for reference.    Significant Diagnostic Studies: Ct Abdomen Pelvis Wo Contrast  Result Date: 06/22/2018 CLINICAL DATA:  72 y/o  F; mid abdominal pain.  Recent GI bleed. EXAM: CT ABDOMEN AND PELVIS WITHOUT CONTRAST TECHNIQUE: Multidetector CT imaging of the abdomen and pelvis was performed following the standard protocol without IV contrast. COMPARISON:  08/15/2015 CT abdomen and pelvis. FINDINGS: Lower chest: No acute abnormality. Hepatobiliary: No focal liver abnormality is seen. Status post cholecystectomy. No biliary dilatation. Pancreas: Unremarkable. No pancreatic ductal dilatation or surrounding inflammatory changes. Spleen: Normal in size without focal abnormality. Adrenals/Urinary Tract: Stable right adrenal adenoma and bilateral hypertrophy. Right kidney cyst measuring up to 16 mm cyst. Kidneys are otherwise unremarkable, without renal calculi, focal lesion, or hydronephrosis. Bladder is unremarkable. Stomach/Bowel: Stomach is within normal limits. Appendix appears  normal. No evidence of bowel wall thickening, distention, or inflammatory changes. Mild sigmoid diverticulosis without findings of acute diverticulitis. Vascular/Lymphatic: Aortic atherosclerosis. No enlarged abdominal or pelvic lymph nodes. Reproductive: Uterus and bilateral adnexa are unremarkable. Other: Stable large midline ventral hernia containing fat. Musculoskeletal: No fracture is seen. L4-5 and L5-S1 grade 1 anterolisthesis. Lumbar spondylosis with prominent lower lumbar facet arthrosis. IMPRESSION: 1. No acute process identified. 2. Mild sigmoid diverticulosis, no findings of acute diverticulitis. 3. Stable right adrenal adenoma and bilateral hypertrophy. 4. Aortic atherosclerosis. 5. Stable large midline ventral hernia containing fat. 6. Lower lumbar spondylosis with prominent facet arthrosis. Electronically Signed   By: Kristine Garbe M.D.   On: 06/22/2018 16:29   Dg Chest Portable 1 View  Result Date: 06/22/2018 CLINICAL DATA:  Hypotension and dizziness for 5 days. EXAM: PORTABLE CHEST 1 VIEW COMPARISON:  Single-view of the chest  12/05/2017. FINDINGS: The lungs are clear. Heart size is enlarged. No pneumothorax or pleural effusion. Aortic atherosclerosis noted. No acute bony abnormality. IMPRESSION: Cardiomegaly without acute disease. Atherosclerosis. Electronically Signed   By: Inge Rise M.D.   On: 06/22/2018 15:54    Microbiology: No results found for this or any previous visit (from the past 240 hour(s)).   Labs: CBC: Recent Labs  Lab 06/29/18 0414  WBC 8.5  HGB 9.5*  HCT 31.0*  MCV 85.2  PLT 728   Basic Metabolic Panel: Recent Labs  Lab 06/29/18 0414  NA 142  K 4.6  CL 112*  CO2 20*  GLUCOSE 110*  BUN 43*  CREATININE 1.66*  CALCIUM 9.5   CBG: Recent Labs  Lab 06/28/18 1644 06/28/18 2230 06/29/18 0727 06/29/18 1153  GLUCAP 103* 125* 98 144*   Time spent: 35 minutes  Signed:  Berle Mull  [facility] 06/29/2018 , 1:39 PM

## 2018-07-20 ENCOUNTER — Ambulatory Visit (INDEPENDENT_AMBULATORY_CARE_PROVIDER_SITE_OTHER): Payer: Medicare Other | Admitting: Podiatry

## 2018-07-20 ENCOUNTER — Encounter: Payer: Self-pay | Admitting: Podiatry

## 2018-07-20 DIAGNOSIS — E1149 Type 2 diabetes mellitus with other diabetic neurological complication: Secondary | ICD-10-CM | POA: Diagnosis not present

## 2018-07-20 DIAGNOSIS — M79675 Pain in left toe(s): Secondary | ICD-10-CM | POA: Diagnosis not present

## 2018-07-20 DIAGNOSIS — B351 Tinea unguium: Secondary | ICD-10-CM

## 2018-07-20 DIAGNOSIS — Q828 Other specified congenital malformations of skin: Secondary | ICD-10-CM | POA: Diagnosis not present

## 2018-07-20 DIAGNOSIS — Z7901 Long term (current) use of anticoagulants: Secondary | ICD-10-CM

## 2018-07-20 DIAGNOSIS — M79674 Pain in right toe(s): Secondary | ICD-10-CM

## 2018-07-24 NOTE — Progress Notes (Signed)
Subjective: 72 y.o. returns the office today for painful, elongated, thickened toenails which she cannot trim herself as well as for painful calluses. Denies any redness or drainage around the nails/calluses.  Denies any acute changes since last appointment and no new complaints today. Denies any systemic complaints such as fevers, chills, nausea, vomiting.  PCP: Nolene Ebbs, MD  Objective: AAO 3, NAD DP pulses 1/4, PT pulse 0/4.  Chronic lower extremity edema is present. Protective sensation decreased with Derrel Nip monofilament Nails hypertrophic, dystrophic, elongated, brittle, discolored 10. There is tenderness overlying the nails 1-5 bilaterally. There is no surrounding erythema or drainage along the nail sites. Hyperkeratotic lesions bilateral hallux, right submetatarsal 2 as well as bilateral heels.  Upon debridement there is no underlying ulceration, drainage or any signs of infection. No open lesions or pre-ulcerative lesions are identified. No pain with calf compression, swelling, warmth, erythema.  Assessment: Patient presents with symptomatic onychomycosis, symptomatic hyperkeratotic lesions.  Plan: -Treatment options including alternatives, risks, complications were discussed -Nails sharply debrided 10 without complication/bleeding. -Hyperkeratotic lesion sharply debrided x5 without any complications or bleeding. -Discussed daily foot inspection. If there are any changes, to call the office immediately.  -Follow-up in 3 months or sooner if any problems are to arise. In the meantime, encouraged to call the office with any questions, concerns, changes symptoms.  Celesta Gentile, DPM

## 2018-08-22 ENCOUNTER — Ambulatory Visit (INDEPENDENT_AMBULATORY_CARE_PROVIDER_SITE_OTHER): Payer: Medicare Other | Admitting: Cardiovascular Disease

## 2018-08-22 ENCOUNTER — Encounter: Payer: Self-pay | Admitting: Cardiovascular Disease

## 2018-08-22 VITALS — BP 126/75 | HR 95 | Ht 64.0 in | Wt 219.8 lb

## 2018-08-22 DIAGNOSIS — I482 Chronic atrial fibrillation, unspecified: Secondary | ICD-10-CM

## 2018-08-22 DIAGNOSIS — E785 Hyperlipidemia, unspecified: Secondary | ICD-10-CM | POA: Diagnosis not present

## 2018-08-22 DIAGNOSIS — I2510 Atherosclerotic heart disease of native coronary artery without angina pectoris: Secondary | ICD-10-CM | POA: Diagnosis not present

## 2018-08-22 DIAGNOSIS — I739 Peripheral vascular disease, unspecified: Secondary | ICD-10-CM | POA: Diagnosis not present

## 2018-08-22 NOTE — Progress Notes (Signed)
Cardiology Office Note   Date:  08/22/2018   ID:  Kathleen [name], DOB November 02, 1946, MRN [id number]  PCP:  Nolene Ebbs, MD  Cardiologist:  Dr. Percival Spanish  No chief complaint on file.     History of Present Illness: Kathleen [name] is a 72 y.o. female who is here today for follow-up visit regarding peripheral arterial disease.   She is followed by Dr. Percival Spanish for chronic medical conditions that include paroxysmal atrial fibrillation, chronic diastolic heart failure and coronary artery disease status post RCA stent in 2003.  She has other chronic medical conditions that include type 2 diabetes, chronic kidney disease, hypertension and hyperlipidemia. Noninvasive vascular studies in our office showed an ABI of 0.67 on the right and 0.76 on the left.  Duplex showed significant right mid SFA stenosis with a peak velocity of 400.  On the left, there was significant disease in the mid and distal SFA.  There was mild to moderate common femoral artery disease. Given that her claudication was mild and she has underlying chronic kidney disease, she was treated medically.  She reports stable symptoms with no significant change from before.   Past Medical History:  Diagnosis Date  . Arthritis   . Blood transfusion    no side affects  . CHF (congestive heart failure) ([place])   . Coronary artery disease    Cypher stent to the RCA in 2003  . Diabetes mellitus    Borderline  . GERD (gastroesophageal reflux disease)   . Hypertension   . Shortness of breath     Past Surgical History:  Procedure Laterality Date  . BIOPSY  06/06/2018   Procedure: BIOPSY;  Surgeon: Otis Brace, MD;  Location: [facility] ENDOSCOPY;  Service: Gastroenterology;;  . CARDIOVERSION N/A 03/17/2015   Procedure: CARDIOVERSION;  Surgeon: Sueanne Margarita, MD;  Location: [facility] ENDOSCOPY;  Service: Cardiovascular;  Laterality: N/A;  . CHOLECYSTECTOMY    . CORONARY ANGIOPLASTY WITH STENT PLACEMENT    . ENTEROSCOPY N/A 06/28/2018   Procedure: ENTEROSCOPY;  Surgeon: Carol Ada, MD;  Location: [facility] ENDOSCOPY;  Service: Endoscopy;  Laterality: N/A;  . ESOPHAGOGASTRODUODENOSCOPY (EGD) WITH PROPOFOL N/A 06/06/2018   Procedure: ESOPHAGOGASTRODUODENOSCOPY (EGD) WITH PROPOFOL;  Surgeon: Otis Brace, MD;  Location: [facility] ENDOSCOPY;  Service: Gastroenterology;  Laterality: N/A;  . GIVENS CAPSULE STUDY N/A 06/25/2018   Procedure: GIVENS CAPSULE STUDY;  Surgeon: Carol Ada, MD;  Location: [facility] ENDOSCOPY;  Service: Endoscopy;  Laterality: N/A;  . HOT HEMOSTASIS N/A 06/06/2018   Procedure: HOT HEMOSTASIS (ARGON PLASMA COAGULATION/BICAP);  Surgeon: Otis Brace, MD;  Location: [facility];  Service: Gastroenterology;  Laterality: N/A;  . HOT HEMOSTASIS N/A 06/28/2018   Procedure: HOT HEMOSTASIS (ARGON PLASMA COAGULATION/BICAP);  Surgeon: Carol Ada, MD;  Location: [facility];  Service: Endoscopy;  Laterality: N/A;  . TOTAL KNEE ARTHROPLASTY       Current Outpatient Medications  Medication Sig Dispense Refill  . albuterol (PROVENTIL HFA;VENTOLIN HFA) 108 (90 Base) MCG/ACT inhaler Inhale 1-2 puffs into the lungs every 4 (four) hours as needed for wheezing or shortness of breath. 1 Inhaler 0  . albuterol (PROVENTIL) (2.5 MG/3ML) 0.083% nebulizer solution Take 2.5 mg by nebulization every 6 (six) hours as needed for wheezing or shortness of breath.    . allopurinol (ZYLOPRIM) 100 MG tablet Take 100 mg by mouth daily.    Marland Kitchen apixaban (ELIQUIS) 5 MG TABS tablet Take 1 tablet (5 mg total) by mouth 2 (two) times daily. 60 tablet 1  . COLCRYS 0.6  MG tablet Take 0.6 mg by mouth daily as needed (gout).     . cyclobenzaprine (FLEXERIL) 10 MG tablet Take 10 mg by mouth 3 (three) times daily as needed for muscle spasms.    Marland Kitchen docusate sodium (COLACE) 100 MG capsule Take 100 mg by mouth 2 (two) times daily as needed for mild constipation.    . famotidine (PEPCID) 20 MG tablet Take 20 mg by mouth at bedtime.     . furosemide (LASIX) 80 MG  tablet Take 1 tablet (80 mg total) by mouth 2 (two) times daily.    Marland Kitchen guaiFENesin (MUCINEX) 600 MG 12 hr tablet Take 600 mg by mouth 2 (two) times daily as needed (congestion).    . isosorbide-hydrALAZINE (BIDIL) 20-37.5 MG tablet Take 1 tablet by mouth 3 (three) times daily. 90 tablet 0  . LANTUS SOLOSTAR 100 UNIT/ML Solostar Pen INJ 10 UNITS [place] QAM  5  . metoprolol tartrate (LOPRESSOR) 50 MG tablet Take 1 tablet (50 mg total) by mouth 2 (two) times daily. 60 tablet 0  . pantoprazole (PROTONIX) 40 MG tablet Take 1 tablet (40 mg total) by mouth daily. 30 tablet 1  . polyethylene glycol (MIRALAX / GLYCOLAX) packet Take 17 g by mouth daily. 14 each 0  . pravastatin (PRAVACHOL) 80 MG tablet Take 80 mg by mouth daily.    . SYMBICORT 80-4.5 MCG/ACT inhaler Inhale 2 puffs into the lungs 2 (two) times daily.  0   No current facility-administered medications for this visit.     Allergies:   Penicillins    Social History:  The patient  reports that she quit smoking about 4 years ago. Her smoking use included cigarettes. She has a 9.00 pack-year smoking history. She has never used smokeless tobacco. She reports that she does not drink alcohol or use drugs.   Family History:  The patient's family history includes Arthritis in her father; Gout in her father; Heart attack in her brother and father; Heart disease in her father; Hypertension in her brother, father, and sister.    ROS:  Please see the history of present illness.   Otherwise, review of systems are positive for none.   All other systems are reviewed and negative.    PHYSICAL EXAM: VS:  BP 126/75   Pulse 95   Ht 5\' 4"  (1.626 m)   Wt 219 lb 12.8 oz (99.7 kg)   BMI 37.73 kg/m  , BMI Body mass index is 37.73 kg/m. GEN: Well nourished, well developed, in no acute distress  HEENT: normal  Neck: no JVD, carotid bruits, or masses Cardiac: Irregularly irregular; no  rubs, or gallops,no edema .  2 out of 6 holosystolic murmur at the apex and  left sternal border Respiratory:  clear to auscultation bilaterally, normal work of breathing GI: soft, nontender, nondistended, + BS MS: no deformity or atrophy  Skin: warm and dry, no rash Neuro:  Strength and sensation are intact Psych: euthymic mood, full affect   EKG:  EKG is not ordered today.    Recent Labs: 06/06/2018: Magnesium 1.9 06/22/2018: ALT 71 06/29/2018: BUN 43; Creatinine, Ser 1.66; Hemoglobin 9.5; Platelets 176; Potassium 4.6; Sodium 142    Lipid Panel    Component Value Date/Time   CHOL 158 01/25/2015 0334   TRIG 77 01/25/2015 0334   HDL 46 01/25/2015 0334   CHOLHDL 3.4 01/25/2015 0334   VLDL 15 01/25/2015 0334   LDLCALC 97 01/25/2015 0334      Wt Readings from Last 3  Encounters:  08/22/18 219 lb 12.8 oz (99.7 kg)  06/29/18 219 lb 11.2 oz (99.7 kg)  06/06/18 212 lb 8.4 oz (96.4 kg)        PAD Screen 03/08/2018  Previous PAD dx? No  Previous surgical procedure? No  Pain with walking? Yes  Subsides with rest? No  Feet/toe relief with dangling? Yes  Painful, non-healing ulcers? No  Extremities discolored? Yes      ASSESSMENT AND PLAN:  1.  Peripheral arterial disease with mild bilateral leg claudication due to SFA disease.  Her symptoms are not lifestyle limiting and I recommend continuing medical therapy.  2.  Atrial fibrillation: Ventricular rate is controlled with metoprolol.  She is on long-term anticoagulation with Eliquis.  3.  Coronary artery disease: Other than shortness of breath, she denies anginal symptoms.  4.  Hyperlipidemia: Continue treatment with pravastatin with a target LDL of less than 70.    Disposition:   FU with me in 12 months  Signed,  Kathlyn Sacramento, MD  08/22/2018 11:56 AM    [place]

## 2018-08-22 NOTE — Patient Instructions (Signed)
Medication Instructions:  Your physician recommends that you continue on your current medications as directed. Please refer to the Current Medication list given to you today.   Labwork: None ordered  Testing/Procedures: None ordered  Follow-Up: Your physician wants you to follow-up in: 1 year with Dr.Arida You will receive a reminder letter in the mail two months in advance. If you don't receive a letter, please call our office to schedule the follow-up appointment.   Any Other Special Instructions Will Be Listed Below (If Applicable).     If you need a refill on your cardiac medications before your next appointment, please call your pharmacy.

## 2018-09-11 ENCOUNTER — Inpatient Hospital Stay (HOSPITAL_COMMUNITY)
Admission: EM | Admit: 2018-09-11 | Discharge: 2018-09-15 | DRG: 357 | Disposition: A | Discharge status: Home / Self Care | Payer: Medicare Other | Attending: Family Medicine | Admitting: Family Medicine

## 2018-09-11 ENCOUNTER — Observation Stay (HOSPITAL_COMMUNITY): Payer: Medicare Other

## 2018-09-11 ENCOUNTER — Encounter (HOSPITAL_COMMUNITY): Payer: Self-pay | Admitting: Emergency Medicine

## 2018-09-11 DIAGNOSIS — Z9049 Acquired absence of other specified parts of digestive tract: Secondary | ICD-10-CM

## 2018-09-11 DIAGNOSIS — I482 Chronic atrial fibrillation, unspecified: Secondary | ICD-10-CM | POA: Diagnosis present

## 2018-09-11 DIAGNOSIS — I2510 Atherosclerotic heart disease of native coronary artery without angina pectoris: Secondary | ICD-10-CM | POA: Diagnosis present

## 2018-09-11 DIAGNOSIS — K31811 Angiodysplasia of stomach and duodenum with bleeding: Principal | ICD-10-CM | POA: Diagnosis present

## 2018-09-11 DIAGNOSIS — K921 Melena: Secondary | ICD-10-CM | POA: Diagnosis present

## 2018-09-11 DIAGNOSIS — Z7901 Long term (current) use of anticoagulants: Secondary | ICD-10-CM

## 2018-09-11 DIAGNOSIS — Z87891 Personal history of nicotine dependence: Secondary | ICD-10-CM

## 2018-09-11 DIAGNOSIS — I10 Essential (primary) hypertension: Secondary | ICD-10-CM | POA: Diagnosis present

## 2018-09-11 DIAGNOSIS — E1122 Type 2 diabetes mellitus with diabetic chronic kidney disease: Secondary | ICD-10-CM | POA: Diagnosis present

## 2018-09-11 DIAGNOSIS — K219 Gastro-esophageal reflux disease without esophagitis: Secondary | ICD-10-CM | POA: Diagnosis present

## 2018-09-11 DIAGNOSIS — D62 Acute posthemorrhagic anemia: Secondary | ICD-10-CM | POA: Diagnosis present

## 2018-09-11 DIAGNOSIS — J449 Chronic obstructive pulmonary disease, unspecified: Secondary | ICD-10-CM | POA: Diagnosis present

## 2018-09-11 DIAGNOSIS — N179 Acute kidney failure, unspecified: Secondary | ICD-10-CM

## 2018-09-11 DIAGNOSIS — I480 Paroxysmal atrial fibrillation: Secondary | ICD-10-CM

## 2018-09-11 DIAGNOSIS — Z96659 Presence of unspecified artificial knee joint: Secondary | ICD-10-CM | POA: Diagnosis present

## 2018-09-11 DIAGNOSIS — I252 Old myocardial infarction: Secondary | ICD-10-CM

## 2018-09-11 DIAGNOSIS — Z79899 Other long term (current) drug therapy: Secondary | ICD-10-CM

## 2018-09-11 DIAGNOSIS — Z9981 Dependence on supplemental oxygen: Secondary | ICD-10-CM

## 2018-09-11 DIAGNOSIS — Z888 Allergy status to other drugs, medicaments and biological substances status: Secondary | ICD-10-CM

## 2018-09-11 DIAGNOSIS — Z8711 Personal history of peptic ulcer disease: Secondary | ICD-10-CM

## 2018-09-11 DIAGNOSIS — Z8673 Personal history of transient ischemic attack (TIA), and cerebral infarction without residual deficits: Secondary | ICD-10-CM

## 2018-09-11 DIAGNOSIS — N184 Chronic kidney disease, stage 4 (severe): Secondary | ICD-10-CM | POA: Diagnosis present

## 2018-09-11 DIAGNOSIS — I130 Hypertensive heart and chronic kidney disease with heart failure and stage 1 through stage 4 chronic kidney disease, or unspecified chronic kidney disease: Secondary | ICD-10-CM | POA: Diagnosis present

## 2018-09-11 DIAGNOSIS — D631 Anemia in chronic kidney disease: Secondary | ICD-10-CM | POA: Diagnosis present

## 2018-09-11 DIAGNOSIS — Z794 Long term (current) use of insulin: Secondary | ICD-10-CM

## 2018-09-11 DIAGNOSIS — E119 Type 2 diabetes mellitus without complications: Secondary | ICD-10-CM

## 2018-09-11 DIAGNOSIS — I5032 Chronic diastolic (congestive) heart failure: Secondary | ICD-10-CM | POA: Diagnosis present

## 2018-09-11 DIAGNOSIS — G4730 Sleep apnea, unspecified: Secondary | ICD-10-CM | POA: Diagnosis present

## 2018-09-11 DIAGNOSIS — K922 Gastrointestinal hemorrhage, unspecified: Secondary | ICD-10-CM | POA: Diagnosis not present

## 2018-09-11 DIAGNOSIS — E11649 Type 2 diabetes mellitus with hypoglycemia without coma: Secondary | ICD-10-CM | POA: Diagnosis not present

## 2018-09-11 DIAGNOSIS — K5521 Angiodysplasia of colon with hemorrhage: Secondary | ICD-10-CM | POA: Diagnosis present

## 2018-09-11 DIAGNOSIS — Z955 Presence of coronary angioplasty implant and graft: Secondary | ICD-10-CM

## 2018-09-11 HISTORY — DX: Sleep apnea, unspecified: G47.30

## 2018-09-11 LAB — COMPREHENSIVE METABOLIC PANEL
ALBUMIN: 3.8 g/dL (ref 3.5–5.0)
ALT: 41 U/L (ref 0–44)
AST: 43 U/L — AB (ref 15–41)
Alkaline Phosphatase: 88 U/L (ref 38–126)
Anion gap: 13 (ref 5–15)
BILIRUBIN TOTAL: 2 mg/dL — AB (ref 0.3–1.2)
BUN: 51 mg/dL — AB (ref 8–23)
CHLORIDE: 102 mmol/L (ref 98–111)
CO2: 28 mmol/L (ref 22–32)
Calcium: 10.5 mg/dL — ABNORMAL HIGH (ref 8.9–10.3)
Creatinine, Ser: 2.79 mg/dL — ABNORMAL HIGH (ref 0.44–1.00)
GFR calc Af Amer: 18 mL/min — ABNORMAL LOW (ref >60)
GFR calc non Af Amer: 16 mL/min — ABNORMAL LOW (ref >60)
GLUCOSE: 77 mg/dL (ref 70–99)
POTASSIUM: 3.3 mmol/L — AB (ref 3.5–5.1)
Sodium: 143 mmol/L (ref 135–145)
TOTAL PROTEIN: 7.6 g/dL (ref 6.5–8.1)

## 2018-09-11 LAB — URINALYSIS, ROUTINE W REFLEX MICROSCOPIC
BILIRUBIN URINE: NEGATIVE
Glucose, UA: NEGATIVE mg/dL
KETONES UR: NEGATIVE mg/dL
LEUKOCYTES UA: NEGATIVE
Nitrite: NEGATIVE
Protein, ur: 30 mg/dL — AB
Specific Gravity, Urine: 1.005 (ref 1.005–1.030)
pH: 7 (ref 5.0–8.0)

## 2018-09-11 LAB — CBC
HEMATOCRIT: 27 % — AB (ref 36.0–46.0)
Hemoglobin: 8.1 g/dL — ABNORMAL LOW (ref 12.0–15.0)
MCH: 22 pg — ABNORMAL LOW (ref 26.0–34.0)
MCHC: 30 g/dL (ref 30.0–36.0)
MCV: 73.4 fL — AB (ref 78.0–100.0)
Platelets: 256 10*3/uL (ref 150–400)
RBC: 3.68 MIL/uL — ABNORMAL LOW (ref 3.87–5.11)
RDW: 21.5 % — AB (ref 11.5–15.5)
WBC: 6.4 10*3/uL (ref 4.0–10.5)

## 2018-09-11 LAB — TYPE AND SCREEN
ABO/RH(D): O POS
Antibody Screen: NEGATIVE

## 2018-09-11 LAB — PROTIME-INR
INR: 2.12
PROTHROMBIN TIME: 23.6 s — AB (ref 11.4–15.2)

## 2018-09-11 MED ORDER — POTASSIUM CHLORIDE IN NACL 20-0.45 MEQ/L-% IV SOLN
INTRAVENOUS | Status: AC
  Administered 2018-09-12: 02:00:00 via INTRAVENOUS
  Filled 2018-09-11 (×2): qty 1000

## 2018-09-11 NOTE — ED Triage Notes (Signed)
Pt c/o dark stools since Friday with abd pains. Take Eliquis.

## 2018-09-11 NOTE — ED Notes (Signed)
ED TO INPATIENT HANDOFF REPORT  Name/Age/Gender Kathleen [name] 72 y.o. female  Code Status Code Status History    Date Active Date Inactive Code Status Order ID Comments User Context   06/22/2018 1837 06/29/2018 1702 Full Code [id number]  [name], MD ED   06/04/2018 1646 06/07/2018 2016 Full Code [id number]  [name], DO Inpatient   12/04/2017 0752 12/06/2017 1738 Full Code [id number]  [name], NP ED   01/23/2015 0608 01/28/2015 1636 Full Code [id number]  [name], MD ED   09/17/2014 1036 09/19/2014 1847 Full Code [id number]  [name], MD Inpatient   11/02/2011 1136 11/04/2011 1452 Full Code [id number]  [name], RN Inpatient      Home/SNF/Other Home  Chief Complaint Hemorrhage of Upper GI Tract  Level of Care/Admitting Diagnosis ED Disposition    ED Disposition Condition [facility] Area: [facility] [[id number]]  Level of Care: Telemetry [5]  Admit to tele based on following criteria: Other see comments  Comments: Gi bleed  Diagnosis: GI bleed [219758]  Admitting Physician: Bethena Roys [[id number]]  Attending Physician: Bethena Roys Nessa.Cuff  PT Class (Do Not Modify): Observation [104]  PT Acc Code (Do Not Modify): Observation [10022]       Medical History Past Medical History:  Diagnosis Date  . Arthritis   . Blood transfusion    no side affects  . CHF (congestive heart failure) ([place])   . Coronary artery disease    Cypher stent to the RCA in 2003  . Diabetes mellitus    Borderline  . GERD (gastroesophageal reflux disease)   . Hypertension   . Shortness of breath     Allergies Allergies  Allergen Reactions  . Penicillins Hives and Shortness Of Breath    Tolerates Ceftin, Ceftriaxone, and Cefepime Has patient had a PCN reaction causing immediate rash, facial/tongue/throat swelling, SOB or lightheadedness with hypotension: Y Has patient had a PCN reaction causing severe rash  involving mucus membranes or skin necrosis: Y Has patient had a PCN reaction that required hospitalization: Y Has patient had a PCN reaction occurring within the last 10 years: N     IV Location/Drains/Wounds Patient Lines/Drains/Airways Status   Active Line/Drains/Airways    Name:   Placement date:   Placement time:   Site:   Days:   Peripheral IV 09/11/18 Left Antecubital   09/11/18    2225    Antecubital   less than 1   Pressure Injury 06/22/18 Stage II -  Partial thickness loss of dermis presenting as a shallow open ulcer with a red, pink wound bed without slough.   06/22/18    2030     81          Labs/Imaging Results for orders placed or performed during the hospital encounter of 09/11/18 (from the past 48 hour(s))  Comprehensive metabolic panel     Status: Abnormal   Collection Time: 09/11/18  3:59 PM  Result Value Ref Range   Sodium 143 135 - 145 mmol/L   Potassium 3.3 (L) 3.5 - 5.1 mmol/L   Chloride 102 98 - 111 mmol/L   CO2 28 22 - 32 mmol/L   Glucose, Bld 77 70 - 99 mg/dL   BUN 51 (H) 8 - 23 mg/dL   Creatinine, Ser 2.79 (H) 0.44 - 1.00 mg/dL   Calcium 10.5 (H) 8.9 - 10.3 mg/dL   Total Protein 7.6 6.5 - 8.1 g/dL  Albumin 3.8 3.5 - 5.0 g/dL   AST 43 (H) 15 - 41 U/L   ALT 41 0 - 44 U/L   Alkaline Phosphatase 88 38 - 126 U/L   Total Bilirubin 2.0 (H) 0.3 - 1.2 mg/dL   GFR calc non Af Amer 16 (L) >60 mL/min   GFR calc Af Amer 18 (L) >60 mL/min    Comment: (NOTE) The eGFR has been calculated using the CKD EPI equation. This calculation has not been validated in all clinical situations. eGFR's persistently <60 mL/min signify possible Chronic Kidney Disease.    Anion gap 13 5 - 15    Comment: Performed at [facility], [place] [address] [place], [place] [address]  CBC     Status: Abnormal   Collection Time: 09/11/18  3:59 PM  Result Value Ref Range   WBC 6.4 4.0 - 10.5 K/uL   RBC 3.68 (L) 3.87 - 5.11 MIL/uL   Hemoglobin 8.1 (L) 12.0 - 15.0 g/dL    HCT 27.0 (L) 36.0 - 46.0 %   MCV 73.4 (L) 78.0 - 100.0 fL   MCH 22.0 (L) 26.0 - 34.0 pg   MCHC 30.0 30.0 - 36.0 g/dL   RDW 21.5 (H) 11.5 - 15.5 %   Platelets 256 150 - 400 K/uL    Comment: REPEATED TO VERIFY SPECIMEN CHECKED FOR CLOTS PLATELET COUNT CONFIRMED BY SMEAR Performed at [facility] [address], [place], [place] [address]   Type and screen [place]     Status: None   Collection Time: 09/11/18  4:00 PM  Result Value Ref Range   ABO/RH(D) O POS    Antibody Screen NEG    Sample Expiration      09/14/2018 Performed at [facility], [place] [address] [place], [place] [address]   Urinalysis, Routine w reflex microscopic     Status: Abnormal   Collection Time: 09/11/18  8:23 PM  Result Value Ref Range   Color, Urine STRAW (A) YELLOW   APPearance CLEAR CLEAR   Specific Gravity, Urine 1.005 1.005 - 1.030   pH 7.0 5.0 - 8.0   Glucose, UA NEGATIVE NEGATIVE mg/dL   Hgb urine dipstick MODERATE (A) NEGATIVE   Bilirubin Urine NEGATIVE NEGATIVE   Ketones, ur NEGATIVE NEGATIVE mg/dL   Protein, ur 30 (A) NEGATIVE mg/dL   Nitrite NEGATIVE NEGATIVE   Leukocytes, UA NEGATIVE NEGATIVE   RBC / HPF 0-5 0 - 5 RBC/hpf   WBC, UA 0-5 0 - 5 WBC/hpf   Bacteria, UA RARE (A) NONE SEEN   Squamous Epithelial / LPF 0-5 0 - 5   Hyaline Casts, UA PRESENT     Comment: Performed at [facility], [place] [address] [place], [place] [address]  Protime-INR     Status: Abnormal   Collection Time: 09/11/18  8:25 PM  Result Value Ref Range   Prothrombin Time 23.6 (H) 11.4 - 15.2 seconds   INR 2.12     Comment: Performed at [facility], [place] [address], [place],  [address]   No results found.  Pending Labs [employer] (From admission, onward)    Start     Ordered   Signed and Held  Comprehensive metabolic panel  Tomorrow morning,   R     Signed and Held   Signed and Held  CBC  Tomorrow  morning,   R     Signed and Held  Vitals/Pain Today's Vitals   09/11/18 1535 09/11/18 1536 09/11/18 2000 09/11/18 2100  BP:  92/68 (!) 146/61 (!) 148/70  Pulse:  78 73 69  Resp:  20  16  Temp:  98.3 F (36.8 C)    TempSrc:  Oral    SpO2:  100% 100% 98%  PainSc: 7        Isolation Precautions No active isolations  Medications Medications  0.45 % NaCl with KCl 20 mEq / L infusion (has no administration in time range)    Mobility power wheelchair, scooter, [place]

## 2018-09-12 ENCOUNTER — Encounter (HOSPITAL_COMMUNITY): Payer: Self-pay | Admitting: *Deleted

## 2018-09-12 ENCOUNTER — Observation Stay (HOSPITAL_COMMUNITY): Payer: Medicare Other | Admitting: Certified Registered"

## 2018-09-12 ENCOUNTER — Other Ambulatory Visit: Payer: Self-pay

## 2018-09-12 ENCOUNTER — Encounter (HOSPITAL_COMMUNITY)
Admission: EM | Disposition: A | Discharge status: Home / Self Care | Payer: Self-pay | Source: Home / Self Care | Attending: Family Medicine

## 2018-09-12 DIAGNOSIS — I482 Chronic atrial fibrillation, unspecified: Secondary | ICD-10-CM | POA: Diagnosis present

## 2018-09-12 DIAGNOSIS — Z9981 Dependence on supplemental oxygen: Secondary | ICD-10-CM | POA: Diagnosis not present

## 2018-09-12 DIAGNOSIS — Z96659 Presence of unspecified artificial knee joint: Secondary | ICD-10-CM | POA: Diagnosis present

## 2018-09-12 DIAGNOSIS — J449 Chronic obstructive pulmonary disease, unspecified: Secondary | ICD-10-CM | POA: Diagnosis present

## 2018-09-12 DIAGNOSIS — I5032 Chronic diastolic (congestive) heart failure: Secondary | ICD-10-CM | POA: Diagnosis present

## 2018-09-12 DIAGNOSIS — N179 Acute kidney failure, unspecified: Secondary | ICD-10-CM | POA: Diagnosis present

## 2018-09-12 DIAGNOSIS — K5521 Angiodysplasia of colon with hemorrhage: Secondary | ICD-10-CM | POA: Diagnosis present

## 2018-09-12 DIAGNOSIS — D62 Acute posthemorrhagic anemia: Secondary | ICD-10-CM | POA: Diagnosis present

## 2018-09-12 DIAGNOSIS — K219 Gastro-esophageal reflux disease without esophagitis: Secondary | ICD-10-CM | POA: Diagnosis present

## 2018-09-12 DIAGNOSIS — K922 Gastrointestinal hemorrhage, unspecified: Secondary | ICD-10-CM | POA: Diagnosis present

## 2018-09-12 DIAGNOSIS — Z7901 Long term (current) use of anticoagulants: Secondary | ICD-10-CM | POA: Diagnosis not present

## 2018-09-12 DIAGNOSIS — N184 Chronic kidney disease, stage 4 (severe): Secondary | ICD-10-CM | POA: Diagnosis present

## 2018-09-12 DIAGNOSIS — Z888 Allergy status to other drugs, medicaments and biological substances status: Secondary | ICD-10-CM | POA: Diagnosis not present

## 2018-09-12 DIAGNOSIS — D631 Anemia in chronic kidney disease: Secondary | ICD-10-CM | POA: Diagnosis present

## 2018-09-12 DIAGNOSIS — G4730 Sleep apnea, unspecified: Secondary | ICD-10-CM | POA: Diagnosis present

## 2018-09-12 DIAGNOSIS — E1122 Type 2 diabetes mellitus with diabetic chronic kidney disease: Secondary | ICD-10-CM | POA: Diagnosis present

## 2018-09-12 DIAGNOSIS — I252 Old myocardial infarction: Secondary | ICD-10-CM | POA: Diagnosis not present

## 2018-09-12 DIAGNOSIS — K921 Melena: Secondary | ICD-10-CM | POA: Diagnosis not present

## 2018-09-12 DIAGNOSIS — Z87891 Personal history of nicotine dependence: Secondary | ICD-10-CM | POA: Diagnosis not present

## 2018-09-12 DIAGNOSIS — K31811 Angiodysplasia of stomach and duodenum with bleeding: Secondary | ICD-10-CM | POA: Diagnosis present

## 2018-09-12 DIAGNOSIS — E11649 Type 2 diabetes mellitus with hypoglycemia without coma: Secondary | ICD-10-CM | POA: Diagnosis not present

## 2018-09-12 DIAGNOSIS — I2510 Atherosclerotic heart disease of native coronary artery without angina pectoris: Secondary | ICD-10-CM | POA: Diagnosis present

## 2018-09-12 DIAGNOSIS — I130 Hypertensive heart and chronic kidney disease with heart failure and stage 1 through stage 4 chronic kidney disease, or unspecified chronic kidney disease: Secondary | ICD-10-CM | POA: Diagnosis present

## 2018-09-12 DIAGNOSIS — I4891 Unspecified atrial fibrillation: Secondary | ICD-10-CM | POA: Diagnosis not present

## 2018-09-12 DIAGNOSIS — Z8673 Personal history of transient ischemic attack (TIA), and cerebral infarction without residual deficits: Secondary | ICD-10-CM | POA: Diagnosis not present

## 2018-09-12 DIAGNOSIS — Z8711 Personal history of peptic ulcer disease: Secondary | ICD-10-CM | POA: Diagnosis not present

## 2018-09-12 HISTORY — PX: ENTEROSCOPY: SHX5533

## 2018-09-12 LAB — COMPREHENSIVE METABOLIC PANEL
ALBUMIN: 3.4 g/dL — AB (ref 3.5–5.0)
ALK PHOS: 80 U/L (ref 38–126)
ALT: 42 U/L (ref 0–44)
ANION GAP: 11 (ref 5–15)
AST: 52 U/L — ABNORMAL HIGH (ref 15–41)
BUN: 43 mg/dL — ABNORMAL HIGH (ref 8–23)
CALCIUM: 9.9 mg/dL (ref 8.9–10.3)
CO2: 29 mmol/L (ref 22–32)
Chloride: 101 mmol/L (ref 98–111)
Creatinine, Ser: 2.55 mg/dL — ABNORMAL HIGH (ref 0.44–1.00)
GFR calc non Af Amer: 17 mL/min — ABNORMAL LOW (ref >60)
GFR, EST AFRICAN AMERICAN: 20 mL/min — AB (ref >60)
GLUCOSE: 93 mg/dL (ref 70–99)
POTASSIUM: 2.9 mmol/L — AB (ref 3.5–5.1)
SODIUM: 141 mmol/L (ref 135–145)
TOTAL PROTEIN: 6.7 g/dL (ref 6.5–8.1)
Total Bilirubin: 1.9 mg/dL — ABNORMAL HIGH (ref 0.3–1.2)

## 2018-09-12 LAB — GLUCOSE, CAPILLARY
GLUCOSE-CAPILLARY: 67 mg/dL — AB (ref 70–99)
GLUCOSE-CAPILLARY: 83 mg/dL (ref 70–99)
Glucose-Capillary: 131 mg/dL — ABNORMAL HIGH (ref 70–99)
Glucose-Capillary: 54 mg/dL — ABNORMAL LOW (ref 70–99)
Glucose-Capillary: 70 mg/dL (ref 70–99)
Glucose-Capillary: 88 mg/dL (ref 70–99)
Glucose-Capillary: 72 mg/dL (ref 70–99)
Glucose-Capillary: 105 mg/dL — ABNORMAL HIGH (ref 70–99)
Glucose-Capillary: 88 mg/dL (ref 70–99)

## 2018-09-12 LAB — CBC
HEMATOCRIT: 26.1 % — AB (ref 36.0–46.0)
HEMOGLOBIN: 8 g/dL — AB (ref 12.0–15.0)
MCH: 22.4 pg — ABNORMAL LOW (ref 26.0–34.0)
MCHC: 30.7 g/dL (ref 30.0–36.0)
MCV: 73.1 fL — ABNORMAL LOW (ref 78.0–100.0)
Platelets: 201 10*3/uL (ref 150–400)
RBC: 3.57 MIL/uL — AB (ref 3.87–5.11)
RDW: 21.6 % — ABNORMAL HIGH (ref 11.5–15.5)
WBC: 6.4 10*3/uL (ref 4.0–10.5)

## 2018-09-12 LAB — HEMOGLOBIN AND HEMATOCRIT, BLOOD
HCT: 28.3 % — ABNORMAL LOW (ref 36.0–46.0)
Hemoglobin: 8.7 g/dL — ABNORMAL LOW (ref 12.0–15.0)

## 2018-09-12 LAB — MAGNESIUM: Magnesium: 1.8 mg/dL (ref 1.7–2.4)

## 2018-09-12 LAB — POTASSIUM: POTASSIUM: 3.6 mmol/L (ref 3.5–5.1)

## 2018-09-12 SURGERY — ENTEROSCOPY
Anesthesia: Monitor Anesthesia Care

## 2018-09-12 MED ORDER — SODIUM CHLORIDE 0.9 % IV SOLN
INTRAVENOUS | Status: DC
  Administered 2018-09-12: 13:00:00 via INTRAVENOUS

## 2018-09-12 MED ORDER — METOPROLOL TARTRATE 50 MG PO TABS
50.0000 mg | ORAL_TABLET | Freq: Two times a day (BID) | ORAL | Status: DC
  Administered 2018-09-12 – 2018-09-15 (×7): 50 mg via ORAL
  Filled 2018-09-12 (×9): qty 1

## 2018-09-12 MED ORDER — PROPOFOL 10 MG/ML IV BOLUS
INTRAVENOUS | Status: AC
  Filled 2018-09-12: qty 20

## 2018-09-12 MED ORDER — PROPOFOL 10 MG/ML IV BOLUS
INTRAVENOUS | Status: DC | PRN
  Administered 2018-09-12 (×2): 20 mg via INTRAVENOUS

## 2018-09-12 MED ORDER — LIDOCAINE 2% (20 MG/ML) 5 ML SYRINGE
INTRAMUSCULAR | Status: DC | PRN
  Administered 2018-09-12: 100 mg via INTRAVENOUS

## 2018-09-12 MED ORDER — ONDANSETRON HCL 4 MG PO TABS: 4.0000 mg | ORAL_TABLET | Freq: Four times a day (QID) | ORAL | Status: DC | PRN

## 2018-09-12 MED ORDER — INSULIN ASPART 100 UNIT/ML ~~LOC~~ SOLN: 0.0000 [IU] | SUBCUTANEOUS | Status: DC

## 2018-09-12 MED ORDER — DEXTROSE 50 % IV SOLN
INTRAVENOUS | Status: AC
  Administered 2018-09-12: 50 mL
  Filled 2018-09-12: qty 50

## 2018-09-12 MED ORDER — MOMETASONE FURO-FORMOTEROL FUM 100-5 MCG/ACT IN AERO
2.0000 | INHALATION_SPRAY | Freq: Two times a day (BID) | RESPIRATORY_TRACT | Status: DC
  Administered 2018-09-12 – 2018-09-15 (×7): 2 via RESPIRATORY_TRACT
  Filled 2018-09-12: qty 8.8

## 2018-09-12 MED ORDER — ONDANSETRON HCL 4 MG/2ML IJ SOLN: 4.0000 mg | Freq: Four times a day (QID) | INTRAMUSCULAR | Status: DC | PRN

## 2018-09-12 MED ORDER — DEXTROSE 50 % IV SOLN
INTRAVENOUS | Status: AC
  Filled 2018-09-12: qty 50

## 2018-09-12 MED ORDER — ALBUTEROL SULFATE (2.5 MG/3ML) 0.083% IN NEBU
2.5000 mg | INHALATION_SOLUTION | Freq: Four times a day (QID) | RESPIRATORY_TRACT | Status: DC | PRN
Start: 2018-09-12 — End: 2018-09-15

## 2018-09-12 MED ORDER — FAMOTIDINE IN NACL 20-0.9 MG/50ML-% IV SOLN
20.0000 mg | Freq: Two times a day (BID) | INTRAVENOUS | Status: DC
  Administered 2018-09-12: 20 mg via INTRAVENOUS
  Filled 2018-09-12: qty 50

## 2018-09-12 MED ORDER — ACETAMINOPHEN 650 MG RE SUPP: 650.0000 mg | Freq: Four times a day (QID) | RECTAL | Status: DC | PRN

## 2018-09-12 MED ORDER — DEXTROSE 50 % IV SOLN
25.0000 mL | Freq: Once | INTRAVENOUS | Status: AC
  Administered 2018-09-12: 25 mL via INTRAVENOUS

## 2018-09-12 MED ORDER — ACETAMINOPHEN 325 MG PO TABS: 650.0000 mg | ORAL_TABLET | Freq: Four times a day (QID) | ORAL | Status: DC | PRN

## 2018-09-12 MED ORDER — POTASSIUM CHLORIDE CRYS ER 20 MEQ PO TBCR
40.0000 meq | EXTENDED_RELEASE_TABLET | Freq: Two times a day (BID) | ORAL | Status: AC
  Administered 2018-09-12 (×2): 40 meq via ORAL
  Filled 2018-09-12 (×2): qty 2

## 2018-09-12 MED ORDER — PROPOFOL 500 MG/50ML IV EMUL
INTRAVENOUS | Status: DC | PRN
  Administered 2018-09-12: 125 ug/kg/min via INTRAVENOUS

## 2018-09-12 MED ORDER — FAMOTIDINE IN NACL 20-0.9 MG/50ML-% IV SOLN
20.0000 mg | INTRAVENOUS | Status: DC
  Administered 2018-09-12 – 2018-09-14 (×3): 20 mg via INTRAVENOUS
  Filled 2018-09-12 (×3): qty 50

## 2018-09-12 NOTE — Progress Notes (Signed)
Kathleen [name] is a 72 y.o. female with medical history significant for DM, NSTEMI, atrial fibrillation, HTN, CKD, GI bleed from AVMs.  Patient presented to the ED with complaints of dark stools over the past 3 days.  She also reports diffuse abdominal pains, worse in left lower quadrant.  She endorses nausea without vomiting, denies loose stools, reports about a week of poor p.o. Intake.  She is on diuretics- Lasix and anticoagulation with Eliquis.  She denies chest pain or dizziness.  She denies NSAID use. No fever or chills.  GI consulted Dr. Benson Norway.  09/12/2018: Patient seen and examined with family member at her bedside.  Reports 3 dark stools last night.  Mild nausea this morning without vomiting.  Hemoglobin dropped to 8.0 with baseline hemoglobin around 9-10.  Planned small bowel endoscopy by Dr. Benson Norway this afternoon.  Please refer to H&P dictated by my partner Dr. Denton Brick on 09/12/2018 for further details of the assessment and plan.

## 2018-09-12 NOTE — Consult Note (Signed)
Reason for Consult: Melena, anemia, and history of AVMs Referring Physician: [facility]  [name] HPI: This is a 72 year old female with a PMH of bleeding AVMs, gastric erosions, CAD s/p stent to RCA, CHF, HTN, CKD, afib on Eliquis admitted for three days of "dark, dark stools.  She was recently treated for bleeding AVMs 06/2018.  During that procedure an actively bleeding D2 AVM was found and she was also noted to have some gastric erosions, which were not evident on the VCE.  Her HGB has drifted down to 8.0 g/dL and her baseline is around 9-10.  She currently denies any issues with chest pain or SOB.  Past Medical History:  Diagnosis Date  . Arthritis   . Blood transfusion    no side affects  . CHF (congestive heart failure) ([place])   . Coronary artery disease    Cypher stent to the RCA in 2003  . Diabetes mellitus    Borderline  . GERD (gastroesophageal reflux disease)   . Hypertension   . Shortness of breath   . Sleep apnea     Past Surgical History:  Procedure Laterality Date  . BIOPSY  06/06/2018   Procedure: BIOPSY;  Surgeon: Otis Brace, MD;  Location: [facility];  Service: Gastroenterology;;  . CARDIOVERSION N/A 03/17/2015   Procedure: CARDIOVERSION;  Surgeon: Sueanne Margarita, MD;  Location: [facility];  Service: Cardiovascular;  Laterality: N/A;  . CHOLECYSTECTOMY    . CORONARY ANGIOPLASTY WITH STENT PLACEMENT    . ENTEROSCOPY N/A 06/28/2018   Procedure: ENTEROSCOPY;  Surgeon: Carol Ada, MD;  Location: [facility];  Service: Endoscopy;  Laterality: N/A;  . ESOPHAGOGASTRODUODENOSCOPY (EGD) WITH PROPOFOL N/A 06/06/2018   Procedure: ESOPHAGOGASTRODUODENOSCOPY (EGD) WITH PROPOFOL;  Surgeon: Otis Brace, MD;  Location: [facility];  Service: Gastroenterology;  Laterality: N/A;  . GIVENS CAPSULE STUDY N/A 06/25/2018   Procedure: GIVENS CAPSULE STUDY;  Surgeon: Carol Ada, MD;  Location: [facility];  Service: Endoscopy;  Laterality: N/A;  . HOT  HEMOSTASIS N/A 06/06/2018   Procedure: HOT HEMOSTASIS (ARGON PLASMA COAGULATION/BICAP);  Surgeon: Otis Brace, MD;  Location: [facility];  Service: Gastroenterology;  Laterality: N/A;  . HOT HEMOSTASIS N/A 06/28/2018   Procedure: HOT HEMOSTASIS (ARGON PLASMA COAGULATION/BICAP);  Surgeon: Carol Ada, MD;  Location: [facility];  Service: Endoscopy;  Laterality: N/A;  . TOTAL KNEE ARTHROPLASTY      Family History  Problem Relation Age of Onset  . Hypertension Father   . Heart disease Father   . Gout Father   . Arthritis Father   . Heart attack Father   . Heart attack Brother   . Hypertension Brother   . Hypertension Sister   . Stroke Neg Hx     Social History:  reports that she quit smoking about 4 years ago. Her smoking use included cigarettes. She has a 9.00 pack-year smoking history. She has never used smokeless tobacco. She reports that she does not drink alcohol or use drugs.  Allergies:  Allergies  Allergen Reactions  . Penicillins Hives and Shortness Of Breath    Tolerates Ceftin, Ceftriaxone, and Cefepime Has patient had a PCN reaction causing immediate rash, facial/tongue/throat swelling, SOB or lightheadedness with hypotension: Y Has patient had a PCN reaction causing severe rash involving mucus membranes or skin necrosis: Y Has patient had a PCN reaction that required hospitalization: Y Has patient had a PCN reaction occurring within the last 10 years: N     Medications:  Scheduled: . dextrose  25 mL Intravenous Once  . [MAR Hold] insulin aspart  0-9 Units Subcutaneous Q4H  . [MAR Hold] metoprolol tartrate  50 mg Oral BID  . [MAR Hold] mometasone-formoterol  2 puff Inhalation BID  . [MAR Hold] potassium chloride  40 mEq Oral BID   Continuous: . 0.45 % NaCl with KCl 20 mEq / L 100 mL/hr at 09/12/18 0600  . sodium chloride 20 mL/hr at 09/12/18 1316  . [MAR Hold] famotidine (PEPCID) IV      Results for orders placed or performed during the hospital  encounter of 09/11/18 (from the past 24 hour(s))  Comprehensive metabolic panel     Status: Abnormal   Collection Time: 09/11/18  3:59 PM  Result Value Ref Range   Sodium 143 135 - 145 mmol/L   Potassium 3.3 (L) 3.5 - 5.1 mmol/L   Chloride 102 98 - 111 mmol/L   CO2 28 22 - 32 mmol/L   Glucose, Bld 77 70 - 99 mg/dL   BUN 51 (H) 8 - 23 mg/dL   Creatinine, Ser 2.79 (H) 0.44 - 1.00 mg/dL   Calcium 10.5 (H) 8.9 - 10.3 mg/dL   Total Protein 7.6 6.5 - 8.1 g/dL   Albumin 3.8 3.5 - 5.0 g/dL   AST 43 (H) 15 - 41 U/L   ALT 41 0 - 44 U/L   Alkaline Phosphatase 88 38 - 126 U/L   Total Bilirubin 2.0 (H) 0.3 - 1.2 mg/dL   GFR calc non Af Amer 16 (L) >60 mL/min   GFR calc Af Amer 18 (L) >60 mL/min   Anion gap 13 5 - 15  CBC     Status: Abnormal   Collection Time: 09/11/18  3:59 PM  Result Value Ref Range   WBC 6.4 4.0 - 10.5 K/uL   RBC 3.68 (L) 3.87 - 5.11 MIL/uL   Hemoglobin 8.1 (L) 12.0 - 15.0 g/dL   HCT 27.0 (L) 36.0 - 46.0 %   MCV 73.4 (L) 78.0 - 100.0 fL   MCH 22.0 (L) 26.0 - 34.0 pg   MCHC 30.0 30.0 - 36.0 g/dL   RDW 21.5 (H) 11.5 - 15.5 %   Platelets 256 150 - 400 K/uL  Type and screen [place]     Status: None   Collection Time: 09/11/18  4:00 PM  Result Value Ref Range   ABO/RH(D) O POS    Antibody Screen NEG    Sample Expiration      09/14/2018 Performed at [facility], [place] [address] [place], [place] [address]   Urinalysis, Routine w reflex microscopic     Status: Abnormal   Collection Time: 09/11/18  8:23 PM  Result Value Ref Range   Color, Urine STRAW (A) YELLOW   APPearance CLEAR CLEAR   Specific Gravity, Urine 1.005 1.005 - 1.030   pH 7.0 5.0 - 8.0   Glucose, UA NEGATIVE NEGATIVE mg/dL   Hgb urine dipstick MODERATE (A) NEGATIVE   Bilirubin Urine NEGATIVE NEGATIVE   Ketones, ur NEGATIVE NEGATIVE mg/dL   Protein, ur 30 (A) NEGATIVE mg/dL   Nitrite NEGATIVE NEGATIVE   Leukocytes, UA NEGATIVE NEGATIVE   RBC / HPF 0-5 0 - 5  RBC/hpf   WBC, UA 0-5 0 - 5 WBC/hpf   Bacteria, UA RARE (A) NONE SEEN   Squamous Epithelial / LPF 0-5 0 - 5   Hyaline Casts, UA PRESENT   Protime-INR     Status: Abnormal   Collection Time: 09/11/18  8:25 PM  Result Value Ref Range   Prothrombin Time 23.6 (H) 11.4 - 15.2 seconds   INR 2.12   Glucose, capillary     Status: None   Collection Time: 09/12/18 12:45 AM  Result Value Ref Range   Glucose-Capillary 88 70 - 99 mg/dL  Hemoglobin and hematocrit, blood     Status: Abnormal   Collection Time: 09/12/18  1:08 AM  Result Value Ref Range   Hemoglobin 8.7 (L) 12.0 - 15.0 g/dL   HCT 28.3 (L) 36.0 - 46.0 %  Magnesium     Status: None   Collection Time: 09/12/18  1:08 AM  Result Value Ref Range   Magnesium 1.8 1.7 - 2.4 mg/dL  Glucose, capillary     Status: Abnormal   Collection Time: 09/12/18  5:22 AM  Result Value Ref Range   Glucose-Capillary 54 (L) 70 - 99 mg/dL  Glucose, capillary     Status: Abnormal   Collection Time: 09/12/18  5:52 AM  Result Value Ref Range   Glucose-Capillary 131 (H) 70 - 99 mg/dL  Comprehensive metabolic panel     Status: Abnormal   Collection Time: 09/12/18  6:20 AM  Result Value Ref Range   Sodium 141 135 - 145 mmol/L   Potassium 2.9 (L) 3.5 - 5.1 mmol/L   Chloride 101 98 - 111 mmol/L   CO2 29 22 - 32 mmol/L   Glucose, Bld 93 70 - 99 mg/dL   BUN 43 (H) 8 - 23 mg/dL   Creatinine, Ser 2.55 (H) 0.44 - 1.00 mg/dL   Calcium 9.9 8.9 - 10.3 mg/dL   Total Protein 6.7 6.5 - 8.1 g/dL   Albumin 3.4 (L) 3.5 - 5.0 g/dL   AST 52 (H) 15 - 41 U/L   ALT 42 0 - 44 U/L   Alkaline Phosphatase 80 38 - 126 U/L   Total Bilirubin 1.9 (H) 0.3 - 1.2 mg/dL   GFR calc non Af Amer 17 (L) >60 mL/min   GFR calc Af Amer 20 (L) >60 mL/min   Anion gap 11 5 - 15  CBC     Status: Abnormal   Collection Time: 09/12/18  6:20 AM  Result Value Ref Range   WBC 6.4 4.0 - 10.5 K/uL   RBC 3.57 (L) 3.87 - 5.11 MIL/uL   Hemoglobin 8.0 (L) 12.0 - 15.0 g/dL   HCT 26.1 (L) 36.0 - 46.0  %   MCV 73.1 (L) 78.0 - 100.0 fL   MCH 22.4 (L) 26.0 - 34.0 pg   MCHC 30.7 30.0 - 36.0 g/dL   RDW 21.6 (H) 11.5 - 15.5 %   Platelets 201 150 - 400 K/uL  Glucose, capillary     Status: None   Collection Time: 09/12/18  8:06 AM  Result Value Ref Range   Glucose-Capillary 70 70 - 99 mg/dL  Glucose, capillary     Status: None   Collection Time: 09/12/18 12:17 PM  Result Value Ref Range   Glucose-Capillary 72 70 - 99 mg/dL  Glucose, capillary     Status: Abnormal   Collection Time: 09/12/18  1:27 PM  Result Value Ref Range   Glucose-Capillary 67 (L) 70 - 99 mg/dL     Ct Abdomen Pelvis Wo Contrast  Result Date: 09/12/2018 CLINICAL DATA:  Abdominal pain and tarry stools for 3 days. On Eliquis. History of cholecystectomy. EXAM: CT ABDOMEN AND PELVIS WITHOUT CONTRAST TECHNIQUE: Multidetector CT imaging of the abdomen and pelvis was performed following the standard protocol  without IV contrast. COMPARISON:  CT abdomen and pelvis June 22, 2018 FINDINGS: LOWER CHEST: Cardiomegaly. Coronary artery calcifications. No pericardial effusion. HEPATOBILIARY: Calcified vessel LEFT lobe of the liver, unremarkable. Status post cholecystectomy. PANCREAS: Normal. SPLEEN: Normal. ADRENALS/URINARY TRACT: Kidneys are orthotopic, demonstrating normal size. LEFT renal scarring. Bilateral renal vascular calcifications, superimposed punctate LEFT potential nephrolithiasis. No hydronephrosis; limited assessment for renal masses by nonenhanced CT. 23 mm upper pole and 15 mm interpolar RIGHT renal cyst. The unopacified ureters are normal in course and caliber. Urinary bladder is partially distended and unremarkable. Similarly thickened adrenal glands seen with hyperplasia. STOMACH/BOWEL: The stomach, small and large bowel are normal in course and caliber without inflammatory changes, sensitivity decreased by lack of enteric contrast. Mild colonic diverticulosis. Rectal air-fluid level. Normal appendix. VASCULAR/LYMPHATIC:  Aortoiliac vessels are normal in course and caliber. Moderate to severe calcific atherosclerosis. No lymphadenopathy by CT size criteria. REPRODUCTIVE: Normal. OTHER: No intraperitoneal free fluid or free air. Moderate fat containing inguinal hernias, a knuckle of transverse colon seen at the hernia neck (Richter hernia). MUSCULOSKELETAL: Non-acute. Severe sacroiliac osteoarthrosis. Severe facet arthropathy. Grade 1 L4-5 anterolisthesis without spondylolysis. IMPRESSION: 1. Rectal air-fluid level seen with enteritis. 2. Colonic diverticulosis without acute diverticulitis. Aortic Atherosclerosis (ICD10-I70.0). Electronically Signed   By: Elon Alas M.D.   On: 09/12/2018 00:46    ROS:  As stated above in the HPI otherwise negative.  Blood pressure 125/65, pulse 74, temperature 98 F (36.7 C), temperature source Oral, resp. rate 17, height 5\' 4"  (1.626 m), weight 91.1 kg, SpO2 98 %.    PE: Gen: NAD, Alert and Oriented HEENT:  [place]/AT, EOMI Neck: Supple, no LAD Lungs: CTA Bilaterally CV: RRR without M/G/R ABM: Soft, NTND, +BS Ext: No C/C/E  Assessment/Plan: 1) Melena. 2) Anemia. 3) History of bleeding proximal small bowel AVMs.  Plan: 1) Enteroscopy with APC.  Naraya Stoneberg D 09/12/2018, 1:30 PM

## 2018-09-12 NOTE — Progress Notes (Signed)

## 2018-09-12 NOTE — Transfer of Care (Signed)
Immediate Anesthesia Transfer of Care Note  Patient: Kathleen [name]  Procedure(s) Performed: ENTEROSCOPY (N/A ) HEMOSTASIS CONTROL  Patient Location: PACU  Anesthesia Type:MAC  Level of Consciousness: awake, alert  and oriented  Airway & Oxygen Therapy: Patient Spontanous Breathing and Patient connected to nasal cannula oxygen  Post-op Assessment: Report given to RN and Post -op Vital signs reviewed and stable  Post vital signs: Reviewed and stable  Last Vitals:  Vitals Value Taken Time  BP    Temp    Pulse 90 09/12/2018  2:26 PM  Resp 19 09/12/2018  2:26 PM  SpO2 100 % 09/12/2018  2:26 PM  Vitals shown include unvalidated device data.  Last Pain:  Vitals:   09/12/18 1311  TempSrc: Oral  PainSc: 0-No pain         Complications: No apparent anesthesia complications

## 2018-09-12 NOTE — ED Provider Notes (Signed)
Emergency Department Provider Note   I have reviewed the triage vital signs and the nursing notes.   HISTORY  Chief Complaint Melena and Abdominal Pain   HPI Kathleen [name] is a 72 y.o. female who presents the emergency department today with 3 days of melena and abdominal pain.  Patient is on Eliquis for atrial fibrillation and for previous history of stroke.  She has had GI bleeds in the past and I reviewed the records it appears that she had AVM and peptic ulcer disease.  Most recently in the hospital in July for the same thing.  This episode has been going on for a few days associated with some abdominal pain.  Little bit of lightheadedness when she gets around.  No other changes.  No syncope.  No chest pain or back pain.  No bright red blood per rectum.  No urinary or vaginal symptoms. No other associated or modifying symptoms.    Past Medical History:  Diagnosis Date  . Arthritis   . Blood transfusion    no side affects  . CHF (congestive heart failure) ([place])   . Coronary artery disease    Cypher stent to the RCA in 2003  . Diabetes mellitus    Borderline  . GERD (gastroesophageal reflux disease)   . Hypertension   . Shortness of breath   . Sleep apnea     Patient Active Problem List   Diagnosis Date Noted  . GIB (gastrointestinal bleeding) 09/12/2018  . GI bleed 09/11/2018  . Malnutrition of moderate degree 06/26/2018  . Pressure injury of skin 06/26/2018  . Hypotension due to hypovolemia 06/22/2018  . Acute metabolic encephalopathy 85/88/5027  . Acute on chronic renal failure ([place]) 06/22/2018  . Transaminitis 06/22/2018  . Anemia due to GI blood loss 06/22/2018  . Epigastric pain 06/22/2018  . Decreased oral intake 06/22/2018  . Hypovolemic shock ([place]) 06/22/2018  . Duodenitis 06/07/2018  . Chronic gastritis 06/07/2018  . Abnormal LFTs   . Upper GI bleed   . Melena 06/04/2018  . Atrial fibrillation, chronic 06/04/2018  . SOB (shortness of breath)  03/08/2018  . SIRS (systemic inflammatory response syndrome) ([place]) 12/04/2017  . Hypothermia 12/04/2017  . Elevated LFTs 12/04/2017  . Sepsis ([place]) 12/04/2017  . Atrial flutter ([place]) 03/17/2015  . NSTEMI (non-ST elevated myocardial infarction) ([place])   . Chronic diastolic CHF (congestive heart failure) ([place])   . Type 2 diabetes mellitus with hypoglycemia without coma ([place])   . Paroxysmal atrial fibrillation ([place]) 01/25/2015  . Non-ST elevation MI (NSTEMI) ([place]) 01/23/2015  . Diabetes mellitus type 2, controlled ([place]) 01/23/2015  . HLD (hyperlipidemia)   . CKD (chronic kidney disease) stage 4, GFR 15-29 ml/min (HCC)   . Acute congestive heart failure ([place]) 09/17/2014  . Postmenopausal bleeding 01/10/2014  . Carotid stenosis 01/04/2012  . Carpal tunnel syndrome on left 11/26/2011  . Transient ischemic attack on medication 11/02/2011  . Diabetes mellitus ([place]) 11/02/2011  . Hyperlipidemia with target LDL less than 70 09/17/2008  . OBESITY 09/17/2008  . Essential hypertension 09/17/2008  . Coronary atherosclerosis 09/17/2008    Past Surgical History:  Procedure Laterality Date  . BIOPSY  06/06/2018   Procedure: BIOPSY;  Surgeon: Otis Brace, MD;  Location: [facility];  Service: Gastroenterology;;  . CARDIOVERSION N/A 03/17/2015   Procedure: CARDIOVERSION;  Surgeon: Sueanne Margarita, MD;  [facility]: [facility];  Service: Cardiovascular;  Laterality: N/A;  . CHOLECYSTECTOMY    . CORONARY ANGIOPLASTY WITH STENT PLACEMENT    .  ENTEROSCOPY N/A 06/28/2018   Procedure: ENTEROSCOPY;  Surgeon: Carol Ada, MD;  [facility];  Service: Endoscopy;  Laterality: N/A;  . ESOPHAGOGASTRODUODENOSCOPY (EGD) WITH PROPOFOL N/A 06/06/2018   Procedure: ESOPHAGOGASTRODUODENOSCOPY (EGD) WITH PROPOFOL;  Surgeon: Otis Brace, MD;  [facility];  Service: Gastroenterology;  Laterality: N/A;  . GIVENS CAPSULE STUDY N/A 06/25/2018   Procedure: GIVENS CAPSULE STUDY;  Surgeon: Carol Ada, MD;  [facility];  Service: Endoscopy;  Laterality: N/A;  . HOT HEMOSTASIS N/A 06/06/2018   Procedure: HOT HEMOSTASIS (ARGON PLASMA COAGULATION/BICAP);  Surgeon: Otis Brace, MD;  Location: [facility];  Service: Gastroenterology;  Laterality: N/A;  . HOT HEMOSTASIS N/A 06/28/2018   Procedure: HOT HEMOSTASIS (ARGON PLASMA COAGULATION/BICAP);  Surgeon: Carol Ada, MD;  Location: [facility];  Service: Endoscopy;  Laterality: N/A;  . TOTAL KNEE ARTHROPLASTY        Allergies Penicillins  Family History  Problem Relation Age of Onset  . Hypertension Father   . Heart disease Father   . Gout Father   . Arthritis Father   . Heart attack Father   . Heart attack Brother   . Hypertension Brother   . Hypertension Sister   . Stroke Neg Hx     Social History Social History   Tobacco Use  . Smoking status: Former Smoker    Packs/day: 0.30    Years: 30.00    Pack years: 9.00    Types: Cigarettes    Last attempt to quit: 01/18/2014    Years since quitting: 4.6  . Smokeless tobacco: Never Used  . Tobacco comment: Smoking cessation requested   Substance Use Topics  . Alcohol use: No  . Drug use: No    Review of Systems  All other systems negative except as documented in the HPI. All pertinent positives and negatives as reviewed in the HPI. ____________________________________________   PHYSICAL EXAM:  VITAL SIGNS: ED Triage Vitals  Enc Vitals Group     BP 09/11/18 1536 92/68     Pulse Rate 09/11/18 1536 78     Resp 09/11/18 1536 20     Temp 09/11/18 1536 98.3 F (36.8 C)     Temp Source 09/11/18 1536 Oral     SpO2 09/11/18 1536 100 %     Weight 09/12/18 0157 200 lb 13.4 oz (91.1 kg)     Height 09/12/18 0157 5\' 4"  (1.626 m)    Constitutional: Alert and oriented. Well appearing and in no acute distress. Eyes: Conjunctivae are icteric. PERRL. EOMI. Head: Atraumatic. Nose: No congestion/rhinnorhea. Mouth/Throat: Mucous membranes are moist.   Oropharynx non-erythematous. Neck: No stridor.  No meningeal signs.   Cardiovascular: Normal rate, regular rhythm. Good peripheral circulation. Grossly normal heart sounds.   Respiratory: Normal respiratory effort.  No retractions. Lungs CTAB. Gastrointestinal: Soft and ttp in llq. No distention.  Musculoskeletal: No lower extremity tenderness nor edema. No gross deformities of extremities. Neurologic:  Normal speech and language. No gross focal neurologic deficits are appreciated.  Skin:  Skin is warm, dry and intact. No rash noted.  ____________________________________________   LABS (all labs ordered are listed, but only abnormal results are displayed)  Labs Reviewed  COMPREHENSIVE METABOLIC PANEL - Abnormal; Notable for the following components:      Result Value   Potassium 3.3 (*)    BUN 51 (*)    Creatinine, Ser 2.79 (*)    Calcium 10.5 (*)    AST 43 (*)    Total Bilirubin 2.0 (*)  GFR calc non Af Amer 16 (*)    GFR calc Af Amer 18 (*)    All other components within normal limits  CBC - Abnormal; Notable for the following components:   RBC 3.68 (*)    Hemoglobin 8.1 (*)    HCT 27.0 (*)    MCV 73.4 (*)    MCH 22.0 (*)    RDW 21.5 (*)    All other components within normal limits  URINALYSIS, ROUTINE W REFLEX MICROSCOPIC - Abnormal; Notable for the following components:   Color, Urine STRAW (*)    Hgb urine dipstick MODERATE (*)    Protein, ur 30 (*)    Bacteria, UA RARE (*)    All other components within normal limits  PROTIME-INR - Abnormal; Notable for the following components:   Prothrombin Time 23.6 (*)    All other components within normal limits  HEMOGLOBIN AND HEMATOCRIT, BLOOD - Abnormal; Notable for the following components:   Hemoglobin 8.7 (*)    HCT 28.3 (*)    All other components within normal limits  COMPREHENSIVE METABOLIC PANEL - Abnormal; Notable for the following components:   Potassium 2.9 (*)    BUN 43 (*)    Creatinine, Ser 2.55 (*)     Albumin 3.4 (*)    AST 52 (*)    Total Bilirubin 1.9 (*)    GFR calc non Af Amer 17 (*)    GFR calc Af Amer 20 (*)    All other components within normal limits  CBC - Abnormal; Notable for the following components:   RBC 3.57 (*)    Hemoglobin 8.0 (*)    HCT 26.1 (*)    MCV 73.1 (*)    MCH 22.4 (*)    RDW 21.6 (*)    All other components within normal limits  GLUCOSE, CAPILLARY - Abnormal; Notable for the following components:   Glucose-Capillary 54 (*)    All other components within normal limits  GLUCOSE, CAPILLARY - Abnormal; Notable for the following components:   Glucose-Capillary 131 (*)    All other components within normal limits  GLUCOSE, CAPILLARY - Abnormal; Notable for the following components:   Glucose-Capillary 67 (*)    All other components within normal limits  GLUCOSE, CAPILLARY - Abnormal; Notable for the following components:   Glucose-Capillary 105 (*)    All other components within normal limits  GLUCOSE, CAPILLARY - Abnormal; Notable for the following components:   Glucose-Capillary 68 (*)    All other components within normal limits  MAGNESIUM  GLUCOSE, CAPILLARY  GLUCOSE, CAPILLARY  GLUCOSE, CAPILLARY  POTASSIUM  GLUCOSE, CAPILLARY  GLUCOSE, CAPILLARY  GLUCOSE, CAPILLARY  CBC  POC OCCULT BLOOD, ED  TYPE AND SCREEN   ____________________________________________  EKG   EKG Interpretation  Date/Time:    Ventricular Rate:    PR Interval:    QRS Duration:   QT Interval:    QTC Calculation:   R Axis:     Text Interpretation:         ____________________________________________  RADIOLOGY  No results found.  ____________________________________________   PROCEDURES  Procedure(s) performed:   Procedures   ____________________________________________   INITIAL IMPRESSION / ASSESSMENT AND PLAN / ED COURSE  Concern for possible upper GI bleed in this patient again with hemoglobin is dropped while on Eliquis but still not  below 7 so will not transfuse at this time.  Discussed with Dr. Benson Norway with gastroenterology who recommends admission for evaluation morning.  N.p.o. after midnight.  Discussed with hospitalist who will admit.   Pertinent labs & imaging results that were available during my care of the patient were reviewed by me and considered in my medical decision making (see chart for details).  ____________________________________________  FINAL CLINICAL IMPRESSION(S) / ED DIAGNOSES  Final diagnoses:  Acute upper gastrointestinal bleeding     MEDICATIONS GIVEN DURING THIS VISIT:  Medications  albuterol (PROVENTIL) (2.5 MG/3ML) 0.083% nebulizer solution 2.5 mg ( Nebulization MAR Unhold 09/12/18 1456)  mometasone-formoterol (DULERA) 100-5 MCG/ACT inhaler 2 puff (2 puffs Inhalation Given 09/12/18 1951)  insulin aspart (novoLOG) injection 0-9 Units (0 Units Subcutaneous Not Given 09/13/18 0007)  ondansetron (ZOFRAN) tablet 4 mg ( Oral MAR Unhold 09/12/18 1456)    Or  ondansetron (ZOFRAN) injection 4 mg ( Intravenous MAR Unhold 09/12/18 1456)  acetaminophen (TYLENOL) tablet 650 mg ( Oral MAR Unhold 09/12/18 1456)    Or  acetaminophen (TYLENOL) suppository 650 mg ( Rectal MAR Unhold 09/12/18 1456)  0.45 % NaCl with KCl 20 mEq / L infusion ( Intravenous Rate/Dose Verify 09/12/18 0600)  metoprolol tartrate (LOPRESSOR) tablet 50 mg (50 mg Oral Given 09/12/18 2153)  famotidine (PEPCID) IVPB 20 mg premix (20 mg Intravenous New Bag/Given 09/12/18 2155)  dextrose 50 % solution (50 mLs  Given 09/12/18 0534)  potassium chloride SA (K-DUR,KLOR-CON) CR tablet 40 mEq (40 mEq Oral Given 09/12/18 2153)  dextrose 50 % solution 25 mL (25 mLs Intravenous Given 09/12/18 1334)     NEW OUTPATIENT MEDICATIONS STARTED DURING THIS VISIT:  Current Discharge Medication List      Note:  This note was prepared with assistance of Dragon voice recognition software. Occasional wrong-word or sound-a-like substitutions may have occurred  due to the inherent limitations of voice recognition software.   Duaine Radin, Corene Cornea, MD 09/13/18 [phone]

## 2018-09-12 NOTE — H&P (Addendum)
History and Physical    Kathleen [name] IRC:[id number] DOB: 07-21-1946 DOA: 09/11/2018  PCP: Nolene Ebbs, MD   Patient coming from: Home  Chief Complaint: Dark stools  HPI: Kathleen [name] is a 72 y.o. female with medical history significant for DM, NSTEMI, atrial fibrillation, HTN, CKD, GI bleed from AVMs.  Patient presented to the ED with complaints of dark stools over the past 3 days.  She also reports diffuse abdominal pains, worse in left lower quadrant.  She endorses nausea without vomiting, denies loose stools, reports about a week of poor p.o. Intake.  She is on diuretics- Lasix and anticoagulation with Eliquis.  She denies chest pain or dizziness.  She denies NSAID use. No fever or chills.  Hospital admission- 7/11- 06/29/18-for GI blood loss-patient underwent capsule endoscopy which showed small bowel AVM not actively bleeding, then had small bowel enteroscopy with APC of duodenal active bleeding AVM.  Eliquis was resumed 3 days after discharge.  ED Course: Blood pressure initially 92/68 (?  Spurious), systolic improved to 510 without intervention.  Pulse 78.  Globin 8.1, normal platelets 256. Low K - 3.3, creatinine increased to 2.79.  Mildly increased total bilirubin 2. UA-rare bacteria. EDP called GI Dr. Benson Norway who recommended n.p.o. from midnight and will see in a.m.  Review of Systems: As per HPI all other systems reviewed and negative.  Past Medical History:  Diagnosis Date  . Arthritis   . Blood transfusion    no side affects  . CHF (congestive heart failure) ([place])   . Coronary artery disease    Cypher stent to the RCA in 2003  . Diabetes mellitus    Borderline  . GERD (gastroesophageal reflux disease)   . Hypertension   . Shortness of breath     Past Surgical History:  Procedure Laterality Date  . BIOPSY  06/06/2018   Procedure: BIOPSY;  Surgeon: Otis Brace, MD;  [facility];  Service: Gastroenterology;;  . CARDIOVERSION N/A 03/17/2015   Procedure: CARDIOVERSION;  Surgeon: Sueanne Margarita, MD;  [facility];  Service: Cardiovascular;  Laterality: N/A;  . CHOLECYSTECTOMY    . CORONARY ANGIOPLASTY WITH STENT PLACEMENT    . ENTEROSCOPY N/A 06/28/2018   Procedure: ENTEROSCOPY;  Surgeon: Carol Ada, MD;  [facility];  Service: Endoscopy;  Laterality: N/A;  . ESOPHAGOGASTRODUODENOSCOPY (EGD) WITH PROPOFOL N/A 06/06/2018   Procedure: ESOPHAGOGASTRODUODENOSCOPY (EGD) WITH PROPOFOL;  Surgeon: Otis Brace, MD;  [facility];  Service: Gastroenterology;  Laterality: N/A;  . GIVENS CAPSULE STUDY N/A 06/25/2018   Procedure: GIVENS CAPSULE STUDY;  Surgeon: Carol Ada, MD;  [facility];  Service: Endoscopy;  Laterality: N/A;  . HOT HEMOSTASIS N/A 06/06/2018   Procedure: HOT HEMOSTASIS (ARGON PLASMA COAGULATION/BICAP);  Surgeon: Otis Brace, MD;  Location: [facility];  Service: Gastroenterology;  Laterality: N/A;  . HOT HEMOSTASIS N/A 06/28/2018   Procedure: HOT HEMOSTASIS (ARGON PLASMA COAGULATION/BICAP);  Surgeon: Carol Ada, MD;  Location: [facility];  Service: Endoscopy;  Laterality: N/A;  . TOTAL KNEE ARTHROPLASTY       reports that she quit smoking about 4 years ago. Her smoking use included cigarettes. She has a 9.00 pack-year smoking history. She has never used smokeless tobacco. She reports that she does not drink alcohol or use drugs.  Allergies  Allergen Reactions  . Penicillins Hives and Shortness Of Breath    Tolerates Ceftin, Ceftriaxone, and Cefepime Has patient had a PCN reaction causing immediate rash, facial/tongue/throat swelling, SOB or lightheadedness with hypotension: Y  Has patient had a PCN reaction causing severe rash involving mucus membranes or skin necrosis: Y Has patient had a PCN reaction that required hospitalization: Y Has patient had a PCN reaction occurring within the last 10 years: N     Family History  Problem Relation Age of Onset  .  Hypertension Father   . Heart disease Father   . Gout Father   . Arthritis Father   . Heart attack Father   . Heart attack Brother   . Hypertension Brother   . Hypertension Sister   . Stroke Neg Hx     Prior to Admission medications   Medication Sig Start Date End Date Taking? Authorizing Provider  albuterol (PROVENTIL HFA;VENTOLIN HFA) 108 (90 Base) MCG/ACT inhaler Inhale 1-2 puffs into the lungs every 4 (four) hours as needed for wheezing or shortness of breath. 12/06/17  Yes Mariel Aloe, MD  albuterol (PROVENTIL) (2.5 MG/3ML) 0.083% nebulizer solution Take 2.5 mg by nebulization every 6 (six) hours as needed for wheezing or shortness of breath.   Yes [provider]  allopurinol (ZYLOPRIM) 100 MG tablet Take 100 mg by mouth daily.   Yes [provider]  apixaban (ELIQUIS) 5 MG TABS tablet Take 1 tablet (5 mg total) by mouth 2 (two) times daily. 07/03/18  Yes Lavina Hamman, MD  famotidine (PEPCID) 20 MG tablet Take 20 mg by mouth at bedtime.  02/04/18  Yes [provider]  furosemide (LASIX) 80 MG tablet Take 1 tablet (80 mg total) by mouth 2 (two) times daily. 06/10/18  Yes Eugenie Filler, MD  isosorbide-hydrALAZINE (BIDIL) 20-37.5 MG tablet Take 1 tablet by mouth 3 (three) times daily. 06/10/18  Yes Eugenie Filler, MD  LANTUS SOLOSTAR 100 UNIT/ML Solostar Pen Inject 10 Units into the skin daily.  03/17/18  Yes [provider]  metoprolol tartrate (LOPRESSOR) 50 MG tablet Take 1 tablet (50 mg total) by mouth 2 (two) times daily. 06/29/18  Yes Lavina Hamman, MD  pantoprazole (PROTONIX) 40 MG tablet Take 1 tablet (40 mg total) by mouth daily. 06/07/18 06/07/19 Yes Eugenie Filler, MD  pravastatin (PRAVACHOL) 80 MG tablet Take 80 mg by mouth daily.   Yes [provider]  SYMBICORT 80-4.5 MCG/ACT inhaler Inhale 2 puffs into the lungs 2 (two) times daily. 02/02/18  Yes [provider]  COLCRYS 0.6 MG tablet Take 0.6 mg by mouth  daily as needed (gout).  02/04/18   [provider]  cyclobenzaprine (FLEXERIL) 10 MG tablet Take 10 mg by mouth 3 (three) times daily as needed for muscle spasms.    [provider]  docusate sodium (COLACE) 100 MG capsule Take 100 mg by mouth 2 (two) times daily as needed for mild constipation.    [provider]  guaiFENesin (MUCINEX) 600 MG 12 hr tablet Take 600 mg by mouth 2 (two) times daily as needed (congestion).    [provider]    Physical Exam: Vitals:   09/11/18 1536 09/11/18 2000 09/11/18 2100 09/11/18 2300  BP: 92/68 (!) 146/61 (!) 148/70 (!) 144/57  Pulse: 78 73 69 78  Resp: 20  16 16   Temp: 98.3 F (36.8 C)     TempSrc: Oral     SpO2: 100% 100% 98% 99%    Constitutional: NAD, calm, comfortable Vitals:   09/11/18 1536 09/11/18 2000 09/11/18 2100 09/11/18 2300  BP: 92/68 (!) 146/61 (!) 148/70 (!) 144/57  Pulse: 78 73 69 78  Resp: 20  16 16  Temp: 98.3 F (36.8 C)     TempSrc: Oral     SpO2: 100% 100% 98% 99%   Eyes: PERRL, lids and conjunctivae normal ENMT: Mucous membranes are moist. Posterior pharynx clear of any exudate or lesions.Normal dentition.  Neck: normal, supple, no masses, no thyromegaly Respiratory: clear to auscultation bilaterally, no wheezing, no crackles. Normal respiratory effort. No accessory muscle use.  Cardiovascular: Irregular rate and rhythm,  2/6 systolic murmurs, no rubs / gallops. No extremity edema. 2+ pedal pulses. No carotid bruits.  Abdomen: Diffuse abdominal tenderness, worse in LLQ, no masses palpated. No hepatosplenomegaly. Bowel sounds positive.  Musculoskeletal: no clubbing / cyanosis. No joint deformity upper and lower extremities. Good ROM, no contractures. Normal muscle tone.  Skin: no rashes, lesions, ulcers. No induration Neurologic: CN 2-12 grossly intact.  Strength 5/5 in all 4.  Psychiatric: Normal judgment and insight. Alert and oriented x 3. Normal mood.   Labs on Admission: I have  personally reviewed following labs and imaging studies  CBC: Recent Labs  Lab 09/11/18 1559  WBC 6.4  HGB 8.1*  HCT 27.0*  MCV 73.4*  PLT 379   Basic Metabolic Panel: Recent Labs  Lab 09/11/18 1559  NA 143  K 3.3*  CL 102  CO2 28  GLUCOSE 77  BUN 51*  CREATININE 2.79*  CALCIUM 10.5*   Liver Function Tests: Recent Labs  Lab 09/11/18 1559  AST 43*  ALT 41  ALKPHOS 88  BILITOT 2.0*  PROT 7.6  ALBUMIN 3.8   Coagulation Profile: Recent Labs  Lab 09/11/18 2025  INR 2.12   Urine analysis:    Component Value Date/Time   COLORURINE STRAW (A) 09/11/2018 2023   APPEARANCEUR CLEAR 09/11/2018 2023   LABSPEC 1.005 09/11/2018 2023   PHURINE 7.0 09/11/2018 2023   GLUCOSEU NEGATIVE 09/11/2018 2023   HGBUR MODERATE (A) 09/11/2018 2023   BILIRUBINUR NEGATIVE 09/11/2018 2023   KETONESUR NEGATIVE 09/11/2018 2023   PROTEINUR 30 (A) 09/11/2018 2023   UROBILINOGEN 1.0 08/15/2015 0915   NITRITE NEGATIVE 09/11/2018 2023   LEUKOCYTESUR NEGATIVE 09/11/2018 2023    Radiological Exams on Admission: No results found.  EKG: None.   Assessment/Plan Principal Problem:   GI bleed Active Problems:   Essential hypertension   Diabetes mellitus type 2, controlled (HCC)   CKD (chronic kidney disease) stage 4, GFR 15-29 ml/min ([facility]CC)   Paroxysmal atrial fibrillation ([facility])   Chronic diastolic CHF (congestive heart failure) (HCC)   Atrial fibrillation, chronic  Acute on chronic anemia- with melena.  Hemoglobin 8.1, from recent hgb 9.5.  Recent capsule endoscopy and enteroscopy. 06/28/18. Angiodysplastic bleeding lesions on small bowel enteroscopy. By Dr. Benson Norway. -Repeat h and h, CBC in a.m. -Follow-up GI recommendations in a.m. -N.p.o. Midnight -IV Famotidine 20 twice daily - Hold eliquis - With diffuse abdominal pain- Ct abdomen Wo Contrast  AKI on CKD 4- Cr- 2.79, baseline 1.5- 1.6. Likely pre-renal from Poor PO intake and lasix 80 BID - Hydrate 1/2 Ns + 20 Kcl 100cc/ hr X 1  day - CMP a.m  Atrial fibrillation-currently in A. Fib, rate controlled and on anticoagulation - Continue metop - Hold eliquis  Diastolic CHF- patient with AKI. LAst echo 03/2018- EF - 60- 65%. - Hydrate - Cont metop.  DM- Glucose- 77. LAst Hgba1c- 6.1 05/2018.  - Hold lantus,  - SSI- s  DVT prophylaxis: Scds Code Status: Full Family Communication: Daughter and brother at bedside Disposition Plan: Per rounding team Consults called: Gi, Dr.  Hung Admission status: Obs, tele   Bethena Roys MD [facility] Pager [phone] From 6PM-2AM.  Otherwise please contact night-coverage [email or web address] Password [email or web address]  09/12/2018, 12:36 AM

## 2018-09-12 NOTE — Progress Notes (Signed)
Hypoglycemic Event  CBG: 54  Treatment: D50 IV 25 mL  Symptoms: None  Follow-up CBG: Time:0550 CBG Result:131  Possible Reasons for Event: Inadequate meal intake  Comments/MD notified: Tylene Fantasia [place]    Gildardo Pounds

## 2018-09-12 NOTE — Plan of Care (Signed)
  Problem: Nutrition: Goal: Adequate nutrition will be maintained Outcome: Progressing   Problem: Pain Managment: Goal: General experience of comfort will improve Outcome: Progressing   Problem: Safety: Goal: Ability to remain free from injury will improve Outcome: Progressing   

## 2018-09-12 NOTE — ED Notes (Signed)
Patient transported to CT 

## 2018-09-12 NOTE — Op Note (Signed)
[facility] Patient Name: Kathleen [name] Procedure Date: 09/12/2018 MRN: [id number] Attending MD: Carol Ada , MD Date of Birth: 13-Feb-1946 CSN: [id number] Age: 72 Admit Type: Outpatient Procedure:                Small bowel enteroscopy Indications:              Melena Providers:                Carol Ada, MD, Baird Cancer, RN, Tinnie Gens,                            Technician, [facility], CRNA Referring MD:              Medicines:                Propofol per Anesthesia Complications:            No immediate complications. Estimated Blood Loss:     Estimated blood loss was minimal. Procedure:                Pre-Anesthesia Assessment:                           - Prior to the procedure, a History and Physical                            was performed, and patient medications and                            allergies were reviewed. The patient's tolerance of                            previous anesthesia was also reviewed. The risks                            and benefits of the procedure and the sedation                            options and risks were discussed with the patient.                            All questions were answered, and informed consent                            was obtained. Prior Anticoagulants: The patient has                            taken no previous anticoagulant or antiplatelet                            agents. ASA Grade Assessment: III - A patient with                            severe systemic disease. After reviewing the risks  and benefits, the patient was deemed in                            satisfactory condition to undergo the procedure.                           - Sedation was administered by an anesthesia                            professional. Deep sedation was attained.                           After obtaining informed consent, the endoscope was                            passed under  direct vision. Throughout the                            procedure, the patient's blood pressure, pulse, and                            oxygen saturations were monitored continuously. The                            [id number]-[id number] ([id number] Olympus peds colonoscope was                            introduced through the mouth and advanced to the                            proximal jejunum. The small bowel enteroscopy was                            accomplished without difficulty. The patient                            tolerated the procedure well. Scope In: Scope Out: Findings:      Multiple angiodysplastic lesions with bleeding were found in the second       portion of the duodenum and in the third portion of the duodenum.       Coagulation for hemostasis using monopolar probe was successful.       Estimated blood loss was minimal.      A few angiodysplastic lesions with bleeding were found in the proximal       jejunum. Coagulation for hemostasis using monopolar probe was       successful. Estimated blood loss was minimal.      Three AVMs were identifed and there was active bleeding with the AVMs.       Two of the AVMs exhibited fresh blood in the area and the D3 AVM was       acrtively oozing blood with visualization. All the lesions were       successfully ablated. Impression:               - Multiple bleeding angiodysplastic lesions in the  duodenum. Treated with a monopolar probe.                           - A few bleeding angiodysplastic lesions in the                            jejunum. Treated with a monopolar probe.                           - No specimens collected. Recommendation:           - Return patient to hospital ward for ongoing care.                           - Resume regular diet.                           - Follow HGB and transfuse as necessary.                           Restart Eliquis in 5 days. Procedure Code(s):        --- Professional ---                            [id number], Small intestinal endoscopy, enteroscopy                            beyond second portion of duodenum, not including                            ileum; with control of bleeding (eg, injection,                            bipolar cautery, unipolar cautery, laser, heater                            probe, stapler, plasma coagulator) Diagnosis Code(s):        --- Professional ---                           K31.811, Angiodysplasia of stomach and duodenum                            with bleeding                           K55.21, Angiodysplasia of colon with hemorrhage                           K92.1, Melena (includes Hematochezia) CPT copyright 2017 [employer]. All rights reserved. The codes documented in this report are preliminary and upon coder review may  be revised to meet current compliance requirements. Carol Ada, MD Carol Ada, MD 09/12/2018 2:31:25 PM This report has been signed electronically. Number of Addenda: 0

## 2018-09-12 NOTE — Anesthesia Postprocedure Evaluation (Signed)
Anesthesia Post Note  Patient: Kathleen [name]  Procedure(s) Performed: ENTEROSCOPY (N/A ) HEMOSTASIS CONTROL     Patient location during evaluation: Endoscopy Anesthesia Type: MAC Level of consciousness: awake and alert, oriented and patient cooperative Pain management: pain level controlled Vital Signs Assessment: post-procedure vital signs reviewed and stable Respiratory status: spontaneous breathing, nonlabored ventilation, respiratory function stable and patient connected to nasal cannula oxygen Cardiovascular status: blood pressure returned to baseline and stable Postop Assessment: no apparent nausea or vomiting Anesthetic complications: no    Last Vitals:  Vitals:   09/12/18 1430 09/12/18 1440  BP: (!) 110/48 (!) 109/36  Pulse: 90 74  Resp: 19 13  Temp:    SpO2: 100% 100%    Last Pain:  Vitals:   09/12/18 1440  TempSrc:   PainSc: 0-No pain                 Mickala Laton,E. Maryan Sivak

## 2018-09-12 NOTE — Anesthesia Preprocedure Evaluation (Signed)
Anesthesia Evaluation  Patient identified by MRN, date of birth, ID band Patient awake    Reviewed: Allergy & Precautions, NPO status , Patient's Chart, lab work & pertinent test results, reviewed documented beta blocker date and time   History of Anesthesia Complications Negative for: history of anesthetic complications  Airway Mallampati: I  TM Distance: >3 FB Neck ROM: Full    Dental  (+) Edentulous Upper, Edentulous Lower   Pulmonary COPD,  COPD inhaler and oxygen dependent, former smoker (quit 2015),    breath sounds clear to auscultation       Cardiovascular hypertension, Pt. on home beta blockers and Pt. on medications (-) angina+ CAD and + Cardiac Stents   Rhythm:Regular Rate:Normal  4/19 ECHO: EF 60-65%, mod MR, mild AI, mild pulm HTN   Neuro/Psych    GI/Hepatic GERD  Medicated,  Endo/Other  diabetes  Renal/GU      Musculoskeletal   Abdominal   Peds  Hematology  (+) Blood dyscrasia (Hb 8.0), anemia , eliquis   Anesthesia Other Findings   Reproductive/Obstetrics                             Anesthesia Physical Anesthesia Plan  ASA: III  Anesthesia Plan: MAC   Post-op Pain Management:    Induction:   PONV Risk Score and Plan: 2 and Treatment may vary due to age or medical condition  Airway Management Planned: Nasal Cannula and Natural Airway  Additional Equipment:   Intra-op Plan:   Post-operative Plan:   Informed Consent: I have reviewed the patients History and Physical, chart, labs and discussed the procedure including the risks, benefits and alternatives for the proposed anesthesia with the patient or authorized representative who has indicated his/her understanding and acceptance.     Plan Discussed with: CRNA and Surgeon  Anesthesia Plan Comments: (Plan routine monitors, MAC)        Anesthesia Quick Evaluation

## 2018-09-12 NOTE — Care Management Obs Status (Signed)
[place] NOTIFICATION   Patient Details  Name: Kathleen [name] MRN: [id number] Date of Birth: [birth year]   Medicare Observation Status Notification Given:  Yes    Leeroy Cha, RN 09/12/2018, 10:54 AM

## 2018-09-13 DIAGNOSIS — E11649 Type 2 diabetes mellitus with hypoglycemia without coma: Secondary | ICD-10-CM

## 2018-09-13 DIAGNOSIS — K921 Melena: Secondary | ICD-10-CM

## 2018-09-13 DIAGNOSIS — I4891 Unspecified atrial fibrillation: Secondary | ICD-10-CM

## 2018-09-13 DIAGNOSIS — N184 Chronic kidney disease, stage 4 (severe): Secondary | ICD-10-CM

## 2018-09-13 DIAGNOSIS — Z794 Long term (current) use of insulin: Secondary | ICD-10-CM

## 2018-09-13 DIAGNOSIS — I5032 Chronic diastolic (congestive) heart failure: Secondary | ICD-10-CM

## 2018-09-13 LAB — BASIC METABOLIC PANEL
Anion gap: 11 (ref 5–15)
BUN: 40 mg/dL — AB (ref 8–23)
CO2: 26 mmol/L (ref 22–32)
Calcium: 9.3 mg/dL (ref 8.9–10.3)
Chloride: 103 mmol/L (ref 98–111)
Creatinine, Ser: 2.44 mg/dL — ABNORMAL HIGH (ref 0.44–1.00)
GFR calc Af Amer: 21 mL/min — ABNORMAL LOW (ref >60)
GFR, EST NON AFRICAN AMERICAN: 18 mL/min — AB (ref >60)
GLUCOSE: 73 mg/dL (ref 70–99)
POTASSIUM: 3.9 mmol/L (ref 3.5–5.1)
Sodium: 140 mmol/L (ref 135–145)

## 2018-09-13 LAB — CBC
HCT: 25.7 % — ABNORMAL LOW (ref 36.0–46.0)
Hemoglobin: 7.9 g/dL — ABNORMAL LOW (ref 12.0–15.0)
MCH: 22.4 pg — AB (ref 26.0–34.0)
MCHC: 30.7 g/dL (ref 30.0–36.0)
MCV: 73 fL — AB (ref 78.0–100.0)
PLATELETS: 189 10*3/uL (ref 150–400)
RBC: 3.52 MIL/uL — ABNORMAL LOW (ref 3.87–5.11)
RDW: 22.1 % — AB (ref 11.5–15.5)
WBC: 7 10*3/uL (ref 4.0–10.5)

## 2018-09-13 LAB — GLUCOSE, CAPILLARY
GLUCOSE-CAPILLARY: 138 mg/dL — AB (ref 70–99)
GLUCOSE-CAPILLARY: 83 mg/dL (ref 70–99)
GLUCOSE-CAPILLARY: 85 mg/dL (ref 70–99)
GLUCOSE-CAPILLARY: 98 mg/dL (ref 70–99)
Glucose-Capillary: 169 mg/dL — ABNORMAL HIGH (ref 70–99)
Glucose-Capillary: 64 mg/dL — ABNORMAL LOW (ref 70–99)
Glucose-Capillary: 64 mg/dL — ABNORMAL LOW (ref 70–99)
Glucose-Capillary: 68 mg/dL — ABNORMAL LOW (ref 70–99)

## 2018-09-13 MED ORDER — DEXTROSE 50 % IV SOLN
25.0000 mL | Freq: Once | INTRAVENOUS | Status: AC
  Administered 2018-09-13: 25 mL via INTRAVENOUS

## 2018-09-13 MED ORDER — SODIUM CHLORIDE 0.9 % IV SOLN
INTRAVENOUS | Status: DC
  Administered 2018-09-14: 05:00:00 via INTRAVENOUS

## 2018-09-13 MED ORDER — DEXTROSE 50 % IV SOLN
INTRAVENOUS | Status: AC
  Administered 2018-09-13: 25 mL via INTRAVENOUS
  Filled 2018-09-13: qty 50

## 2018-09-13 NOTE — Progress Notes (Signed)
Hypoglycemic Event CBG 68 4oz OJ given Will recheck in 15 mins.

## 2018-09-13 NOTE — Progress Notes (Signed)
CBG 85

## 2018-09-13 NOTE — Progress Notes (Signed)
PROGRESS NOTE    Kathleen [name]  AYT:[id number] DOB: 1946-02-03 DOA: 09/11/2018 PCP: Nolene Ebbs, MD   Brief Narrative: Kathleen [name] is a 71 y.o. femalewith medical history significantforDM, NSTEMI,atrial fibrillation, HTN, CKD,AVMs. Patient presented secondary to dark stools found to have AVMs.   Assessment & Plan:   Principal Problem:   GI bleed Active Problems:   Essential hypertension   Diabetes mellitus type 2, controlled (HCC)   CKD (chronic kidney disease) stage 4, GFR 15-29 ml/min (HCC)   Paroxysmal atrial fibrillation (HCC)   Chronic diastolic CHF (congestive heart failure) (HCC)   Atrial fibrillation, chronic   GIB (gastrointestinal bleeding)   GI bleeding Angiodysplastic lesions Patient is s/p small bowel enteroscopy significant for bleeding angiodysplastic lesions which were treated. Recommendations to restart Eliquis 5 days post procedure.  Hypoglycemia Unknown etiology. Patient required orange juice this morning. Patient is on Lantus, however, last dose was on 9/30. Taking age into consideration with low hemoglobin A1C, there is concern for worsening hypoglycemia if discharged today. -Blood sugar checks -AM Cortisol level  Diabetes mellitus, type 2 Last hemoglobin A1C of 6.1%. Patient is on Lantus 10 units as an outpatient -Discontinue Lantus on discharge  Chronic diastolic heart failure -Continue metoprolol  Atrial fibrillation Currently in atrial fibrillation. Normal rate. On metoprolol and Eliquis -Continue metoprolol -Hold Eliquis as mentioned above  Acute kidney injury on CKD 4 Baseline creatinine of 1.6. Creatinine of 2.79 on admission. In setting of low blood volume in addition to Lasix use. -Normal saline IV fluids -Repeat BMP in AM  Acute blood loss anemia Secondary to GI bleeding.   DVT prophylaxis: SCDs Code Status:   Code Status: Full Code Family Communication: Brother at bedside Disposition Plan: Discharge in 24 hours  pending improvement of renal function   Consultants:   Gastroenterology  Procedures:   10/1: Small bowel enteroscopy  Antimicrobials:  None    Subjective: Patient has had no bleeding overnight. Hypoglycemic this morning.  Objective: Vitals:   09/12/18 1951 09/12/18 2045 09/13/18 0440 09/13/18 0857  BP:  (!) 125/49 110/80   Pulse:  (!) 108 80 71  Resp:  16 18 16   Temp:  98.9 F (37.2 C) 99.5 F (37.5 C)   TempSrc:  Oral Oral   SpO2: 98% 94% 100% 98%  Weight:      Height:        Intake/Output Summary (Last 24 hours) at 09/13/2018 0919 Last data filed at 09/12/2018 1743 Gross per 24 hour  Intake 1153.26 ml  Output -  Net 1153.26 ml   Filed Weights   09/12/18 0157  Weight: 91.1 kg    Examination:  General exam: Appears calm and comfortable Respiratory system: Clear to auscultation. Respiratory effort normal. Cardiovascular system: S1 & S2 heard, irregular rhythm with normal rate. No murmurs, rubs, gallops or clicks. Gastrointestinal system: Abdomen is nondistended, soft and nontender. No organomegaly or masses felt. Normal bowel sounds heard. Central nervous system: Alert and oriented. No focal neurological deficits. Extremities: No edema. No calf tenderness Skin: No cyanosis. No rashes Psychiatry: Judgement and insight appear normal. Mood & affect appropriate.     Data Reviewed: I have personally reviewed following labs and imaging studies  CBC: Recent Labs  Lab 09/11/18 1559 09/12/18 0108 09/12/18 0620 09/13/18 0544  WBC 6.4  --  6.4 7.0  HGB 8.1* 8.7* 8.0* 7.9*  HCT 27.0* 28.3* 26.1* 25.7*  MCV 73.4*  --  73.1* 73.0*  PLT 256  --  201 189  Basic Metabolic Panel: Recent Labs  Lab 09/11/18 1559 09/12/18 0108 09/12/18 0620 09/12/18 1610 09/13/18 0544  NA 143  --  141  --  140  K 3.3*  --  2.9* 3.6 3.9  CL 102  --  101  --  103  CO2 28  --  29  --  26  GLUCOSE 77  --  93  --  73  BUN 51*  --  43*  --  40*  CREATININE 2.79*  --  2.55*   --  2.44*  CALCIUM 10.5*  --  9.9  --  9.3  MG  --  1.8  --   --   --    GFR: Estimated Creatinine Clearance: 22.1 mL/min (A) (by C-G formula based on SCr of 2.44 mg/dL (H)). Liver Function Tests: Recent Labs  Lab 09/11/18 1559 09/12/18 0620  AST 43* 52*  ALT 41 42  ALKPHOS 88 80  BILITOT 2.0* 1.9*  PROT 7.6 6.7  ALBUMIN 3.8 3.4*   No results for input(s): LIPASE, AMYLASE in the last 168 hours. No results for input(s): AMMONIA in the last 168 hours. Coagulation Profile: Recent Labs  Lab 09/11/18 2025  INR 2.12   Cardiac Enzymes: No results for input(s): CKTOTAL, CKMB, CKMBINDEX, TROPONINI in the last 168 hours. BNP (last 3 results) No results for input(s): PROBNP in the last 8760 hours. HbA1C: No results for input(s): HGBA1C in the last 72 hours. CBG: Recent Labs  Lab 09/13/18 0003 09/13/18 0022 09/13/18 0437 09/13/18 0743 09/13/18 0811  GLUCAP 68* 85 83 64* 64*   Lipid Profile: No results for input(s): CHOL, HDL, LDLCALC, TRIG, CHOLHDL, LDLDIRECT in the last 72 hours. Thyroid Function Tests: No results for input(s): TSH, T4TOTAL, FREET4, T3FREE, THYROIDAB in the last 72 hours. Anemia Panel: No results for input(s): VITAMINB12, FOLATE, FERRITIN, TIBC, IRON, RETICCTPCT in the last 72 hours. Sepsis Labs: No results for input(s): PROCALCITON, LATICACIDVEN in the last 168 hours.  No results found for this or any previous visit (from the past 240 hour(s)).       Radiology Studies: Ct Abdomen Pelvis Wo Contrast  Result Date: 09/12/2018 CLINICAL DATA:  Abdominal pain and tarry stools for 3 days. On Eliquis. History of cholecystectomy. EXAM: CT ABDOMEN AND PELVIS WITHOUT CONTRAST TECHNIQUE: Multidetector CT imaging of the abdomen and pelvis was performed following the standard protocol without IV contrast. COMPARISON:  CT abdomen and pelvis June 22, 2018 FINDINGS: LOWER CHEST: Cardiomegaly. Coronary artery calcifications. No pericardial effusion. HEPATOBILIARY:  Calcified vessel LEFT lobe of the liver, unremarkable. Status post cholecystectomy. PANCREAS: Normal. SPLEEN: Normal. ADRENALS/URINARY TRACT: Kidneys are orthotopic, demonstrating normal size. LEFT renal scarring. Bilateral renal vascular calcifications, superimposed punctate LEFT potential nephrolithiasis. No hydronephrosis; limited assessment for renal masses by nonenhanced CT. 23 mm upper pole and 15 mm interpolar RIGHT renal cyst. The unopacified ureters are normal in course and caliber. Urinary bladder is partially distended and unremarkable. Similarly thickened adrenal glands seen with hyperplasia. STOMACH/BOWEL: The stomach, small and large bowel are normal in course and caliber without inflammatory changes, sensitivity decreased by lack of enteric contrast. Mild colonic diverticulosis. Rectal air-fluid level. Normal appendix. VASCULAR/LYMPHATIC: Aortoiliac vessels are normal in course and caliber. Moderate to severe calcific atherosclerosis. No lymphadenopathy by CT size criteria. REPRODUCTIVE: Normal. OTHER: No intraperitoneal free fluid or free air. Moderate fat containing inguinal hernias, a knuckle of transverse colon seen at the hernia neck (Richter hernia). MUSCULOSKELETAL: Non-acute. Severe sacroiliac osteoarthrosis. Severe facet arthropathy. Grade 1 L4-5 anterolisthesis  without spondylolysis. IMPRESSION: 1. Rectal air-fluid level seen with enteritis. 2. Colonic diverticulosis without acute diverticulitis. Aortic Atherosclerosis (ICD10-I70.0). Electronically Signed   By: Elon Alas M.D.   On: 09/12/2018 00:46        Scheduled Meds: . insulin aspart  0-9 Units Subcutaneous Q4H  . metoprolol tartrate  50 mg Oral BID  . mometasone-formoterol  2 puff Inhalation BID   Continuous Infusions: . famotidine (PEPCID) IV 20 mg (09/12/18 2155)     LOS: 1 day     Cordelia Poche, MD [facility] 09/13/2018, 9:19 AM Pager: [phone]  If 7PM-7AM, please contact  night-coverage [email or web address] 09/13/2018, 9:19 AM

## 2018-09-13 NOTE — Progress Notes (Signed)
Hypoglycemic Event  CBG: 62  Treatment: 15 GM carbohydrate snack  Symptoms: None  Follow-up CBG: Time:0800 CBG Result:  Possible Reasons for Event: Unknown  Comments/MD notified:Netty MD via page at [place]

## 2018-09-14 ENCOUNTER — Encounter (HOSPITAL_COMMUNITY): Payer: Self-pay | Admitting: Gastroenterology

## 2018-09-14 ENCOUNTER — Inpatient Hospital Stay (HOSPITAL_COMMUNITY): Payer: Medicare Other

## 2018-09-14 LAB — BASIC METABOLIC PANEL
Anion gap: 9 (ref 5–15)
BUN: 37 mg/dL — AB (ref 8–23)
CALCIUM: 9.3 mg/dL (ref 8.9–10.3)
CO2: 25 mmol/L (ref 22–32)
CREATININE: 2.49 mg/dL — AB (ref 0.44–1.00)
Chloride: 106 mmol/L (ref 98–111)
GFR calc Af Amer: 21 mL/min — ABNORMAL LOW (ref >60)
GFR, EST NON AFRICAN AMERICAN: 18 mL/min — AB (ref >60)
Glucose, Bld: 98 mg/dL (ref 70–99)
POTASSIUM: 3.8 mmol/L (ref 3.5–5.1)
Sodium: 140 mmol/L (ref 135–145)

## 2018-09-14 LAB — GLUCOSE, CAPILLARY
GLUCOSE-CAPILLARY: 106 mg/dL — AB (ref 70–99)
Glucose-Capillary: 131 mg/dL — ABNORMAL HIGH (ref 70–99)
Glucose-Capillary: 115 mg/dL — ABNORMAL HIGH (ref 70–99)
Glucose-Capillary: 167 mg/dL — ABNORMAL HIGH (ref 70–99)

## 2018-09-14 LAB — SODIUM, URINE, RANDOM: Sodium, Ur: <10 mmol/L

## 2018-09-14 LAB — CREATININE, URINE, RANDOM: Creatinine, Urine: 170.51 mg/dL

## 2018-09-14 LAB — CORTISOL-AM, BLOOD: CORTISOL - AM: 10.2 ug/dL (ref 6.7–22.6)

## 2018-09-14 MED ORDER — DOCUSATE SODIUM 100 MG PO CAPS
100.0000 mg | ORAL_CAPSULE | Freq: Two times a day (BID) | ORAL | Status: DC
  Administered 2018-09-14 – 2018-09-15 (×3): 100 mg via ORAL
  Filled 2018-09-14 (×2): qty 1

## 2018-09-14 NOTE — Progress Notes (Signed)
HR is recorded incorrectly - manually verified HR = 98

## 2018-09-14 NOTE — Progress Notes (Signed)
PROGRESS NOTE    Kathleen [name]  WUJ:[id number] DOB: August 05, 1946 DOA: 09/11/2018 PCP: Nolene Ebbs, MD   Brief Narrative: Kathleen [name] is a 72 y.o. femalewith medical history significantforDM, NSTEMI,atrial fibrillation, HTN, CKD,AVMs. Patient presented secondary to dark stools found to have AVMs. Also with an AKI which is significantly above baseline, improving slowly but not currently at baseline.   Assessment & Plan:   Principal Problem:   GI bleed Active Problems:   Essential hypertension   Diabetes mellitus type 2, controlled (HCC)   CKD (chronic kidney disease) stage 4, GFR 15-29 ml/min (HCC)   Paroxysmal atrial fibrillation (HCC)   Chronic diastolic CHF (congestive heart failure) (HCC)   Atrial fibrillation, chronic   GIB (gastrointestinal bleeding)   GI bleeding Angiodysplastic lesions Patient is s/p small bowel enteroscopy significant for bleeding angiodysplastic lesions which were treated. Recommendations to restart Eliquis 5 days post procedure.  Hypoglycemia Unknown etiology. Patient is on Lantus, however, last dose was on 9/30. Improved overnight. -Blood sugar checks  Diabetes mellitus, type 2 Last hemoglobin A1C of 6.1%. Patient is on Lantus 10 units as an outpatient -Discontinue Lantus on discharge  Chronic diastolic heart failure -Continue metoprolol  Atrial fibrillation Currently in atrial fibrillation. Normal rate. On metoprolol and Eliquis -Continue metoprolol -Hold Eliquis as mentioned above  Acute kidney injury on CKD 4 Baseline creatinine of 1.6. Creatinine of 2.79 on admission. In setting of low blood volume in addition to Lasix use. Was improving, however, trended up slightly. Concern for continued worsening kidney injury. Patient follows with [facility] as an outpatient. Similar presentation on last admission requiring IV fluids for resolution of AKI. -Normal saline IV fluids -Repeat BMP in AM -Renal ultrasound/urine sodium and  creatinine  Acute blood loss anemia in setting of chronic anemia Secondary to GI bleeding in setting of long standing anemia from renal disease.   DVT prophylaxis: SCDs Code Status:   Code Status: Full Code Family Communication: Brother at bedside Disposition Plan: Discharge in 24 hours pending improvement of renal function   Consultants:   Gastroenterology  Procedures:   10/1: Small bowel enteroscopy  Antimicrobials:  None    Subjective: No bowel movement still. No other issues overnight.  Objective: Vitals:   09/13/18 2101 09/14/18 0410 09/14/18 0822 09/14/18 1340  BP:  105/66  (!) 98/58  Pulse:  (!) 58  80  Resp:  18  20  Temp:  97.8 F (36.6 C)  97.9 F (36.6 C)  TempSrc:  Oral  Oral  SpO2: 99% 100% 99% 100%  Weight:      Height:        Intake/Output Summary (Last 24 hours) at 09/14/2018 1510 Last data filed at 09/14/2018 1235 Gross per 24 hour  Intake 410 ml  Output 100 ml  Net 310 ml   Filed Weights   09/12/18 0157  Weight: 91.1 kg    Examination:  General exam: Appears calm and comfortable Respiratory system: Clear to auscultation. Respiratory effort normal. Cardiovascular system: S1 & S2 heard, RRR. No murmurs, rubs, gallops or clicks. Gastrointestinal system: Abdomen is nondistended, soft and nontender. No organomegaly or masses felt. Normal bowel sounds heard. Central nervous system: Alert and oriented. No focal neurological deficits. Extremities: No edema. No calf tenderness Skin: No cyanosis. No rashes Psychiatry: Judgement and insight appear normal. Mood & affect appropriate.     Data Reviewed: I have personally reviewed following labs and imaging studies  CBC: Recent Labs  Lab 09/11/18 1559 09/12/18 0108 09/12/18 0620 09/13/18  0544  WBC 6.4  --  6.4 7.0  HGB 8.1* 8.7* 8.0* 7.9*  HCT 27.0* 28.3* 26.1* 25.7*  MCV 73.4*  --  73.1* 73.0*  PLT 256  --  201 712   Basic Metabolic Panel: Recent Labs  Lab 09/11/18 1559  09/12/18 0108 09/12/18 0620 09/12/18 1610 09/13/18 0544 09/14/18 0821  NA 143  --  141  --  140 140  K 3.3*  --  2.9* 3.6 3.9 3.8  CL 102  --  101  --  103 106  CO2 28  --  29  --  26 25  GLUCOSE 77  --  93  --  73 98  BUN 51*  --  43*  --  40* 37*  CREATININE 2.79*  --  2.55*  --  2.44* 2.49*  CALCIUM 10.5*  --  9.9  --  9.3 9.3  MG  --  1.8  --   --   --   --    GFR: Estimated Creatinine Clearance: 21.7 mL/min (A) (by C-G formula based on SCr of 2.49 mg/dL (H)). Liver Function Tests: Recent Labs  Lab 09/11/18 1559 09/12/18 0620  AST 43* 52*  ALT 41 42  ALKPHOS 88 80  BILITOT 2.0* 1.9*  PROT 7.6 6.7  ALBUMIN 3.8 3.4*   No results for input(s): LIPASE, AMYLASE in the last 168 hours. No results for input(s): AMMONIA in the last 168 hours. Coagulation Profile: Recent Labs  Lab 09/11/18 2025  INR 2.12   Cardiac Enzymes: No results for input(s): CKTOTAL, CKMB, CKMBINDEX, TROPONINI in the last 168 hours. BNP (last 3 results) No results for input(s): PROBNP in the last 8760 hours. HbA1C: No results for input(s): HGBA1C in the last 72 hours. CBG: Recent Labs  Lab 09/13/18 0842 09/13/18 1120 09/13/18 2052 09/14/18 0727 09/14/18 1140  GLUCAP 169* 98 138* 106* 131*   Lipid Profile: No results for input(s): CHOL, HDL, LDLCALC, TRIG, CHOLHDL, LDLDIRECT in the last 72 hours. Thyroid Function Tests: No results for input(s): TSH, T4TOTAL, FREET4, T3FREE, THYROIDAB in the last 72 hours. Anemia Panel: No results for input(s): VITAMINB12, FOLATE, FERRITIN, TIBC, IRON, RETICCTPCT in the last 72 hours. Sepsis Labs: No results for input(s): PROCALCITON, LATICACIDVEN in the last 168 hours.  No results found for this or any previous visit (from the past 240 hour(s)).       Radiology Studies: No results found.      Scheduled Meds: . docusate sodium  100 mg Oral BID  . metoprolol tartrate  50 mg Oral BID  . mometasone-formoterol  2 puff Inhalation BID    Continuous Infusions: . sodium chloride 75 mL/hr at 09/14/18 0513  . famotidine (PEPCID) IV 20 mg (09/13/18 2313)     LOS: 2 days     Cordelia Poche, MD [facility] 09/14/2018, 3:10 PM Pager: [phone]  If 7PM-7AM, please contact night-coverage [email or web address] 09/14/2018, 3:10 PM

## 2018-09-15 LAB — BASIC METABOLIC PANEL
Anion gap: 12 (ref 5–15)
BUN: 34 mg/dL — ABNORMAL HIGH (ref 8–23)
CALCIUM: 9.5 mg/dL (ref 8.9–10.3)
CHLORIDE: 108 mmol/L (ref 98–111)
CO2: 23 mmol/L (ref 22–32)
Creatinine, Ser: 2.36 mg/dL — ABNORMAL HIGH (ref 0.44–1.00)
GFR calc Af Amer: 22 mL/min — ABNORMAL LOW (ref >60)
GFR calc non Af Amer: 19 mL/min — ABNORMAL LOW (ref >60)
GLUCOSE: 106 mg/dL — AB (ref 70–99)
Potassium: 3.9 mmol/L (ref 3.5–5.1)
Sodium: 143 mmol/L (ref 135–145)

## 2018-09-15 LAB — CBC
HEMATOCRIT: 26.5 % — AB (ref 36.0–46.0)
HEMOGLOBIN: 8 g/dL — AB (ref 12.0–15.0)
MCH: 22.2 pg — ABNORMAL LOW (ref 26.0–34.0)
MCHC: 30.2 g/dL (ref 30.0–36.0)
MCV: 73.4 fL — AB (ref 78.0–100.0)
Platelets: 173 10*3/uL (ref 150–400)
RBC: 3.61 MIL/uL — AB (ref 3.87–5.11)
RDW: 22.4 % — AB (ref 11.5–15.5)
WBC: 5 10*3/uL (ref 4.0–10.5)

## 2018-09-15 LAB — GLUCOSE, CAPILLARY
GLUCOSE-CAPILLARY: 117 mg/dL — AB (ref 70–99)
Glucose-Capillary: 122 mg/dL — ABNORMAL HIGH (ref 70–99)

## 2018-09-15 MED ORDER — FUROSEMIDE 80 MG PO TABS: 40.0000 mg | ORAL_TABLET | Freq: Two times a day (BID) | ORAL | Status: DC

## 2018-09-15 MED ORDER — APIXABAN 5 MG PO TABS: 5.0000 mg | ORAL_TABLET | Freq: Two times a day (BID) | ORAL | Status: DC

## 2018-09-15 NOTE — Progress Notes (Signed)
Discharge instructions and medications discussed with patient.  AVS given to patient.  All questions answered.  

## 2018-09-15 NOTE — Discharge Summary (Addendum)
Physician Discharge Summary  [name]:[id number] DOB: 1946/07/17 DOA: 09/11/2018  PCP: Nolene Ebbs, MD  Admit date: 09/11/2018 Discharge date: 09/15/2018  Admitted From: Home Disposition: Home  Recommendations for Outpatient Follow-up:  1. Follow up with PCP in 3 days 2. Please obtain BMP/CBC in 3 days 3. Follow up with GI as needed 4. Please follow up on the following pending results: None  Home Health: None Equipment/Devices: None  Discharge Condition: Stable CODE STATUS: Full code Diet recommendation: Heart healthy   Brief/Interim Summary:  Admission HPI written by Bethena Roys, MD   Chief Complaint: Dark stools  HPI: Kathleen [name] is a 72 y.o. female with medical history significant for DM, NSTEMI, atrial fibrillation, HTN, CKD, GI bleed from AVMs.  Patient presented to the ED with complaints of dark stools over the past 3 days.  She also reports diffuse abdominal pains, worse in left lower quadrant.  She endorses nausea without vomiting, denies loose stools, reports about a week of poor p.o. Intake.  She is on diuretics- Lasix and anticoagulation with Eliquis.  She denies chest pain or dizziness.  She denies NSAID use. No fever or chills.  Hospital admission- 7/11- 06/29/18-for GI blood loss-patient underwent capsule endoscopy which showed small bowel AVM not actively bleeding, then had small bowel enteroscopy with APC of duodenal active bleeding AVM.  Eliquis was resumed 3 days after discharge.  ED Course: Blood pressure initially 92/68 (?  Spurious), systolic improved to 782 without intervention.  Pulse 78.  Globin 8.1, normal platelets 256. Low K - 3.3, creatinine increased to 2.79.  Mildly increased total bilirubin 2. UA-rare bacteria. EDP called GI Dr. Benson Norway who recommended n.p.o. from midnight and will see in a.m.   Hospital course:  GI bleeding Angiodysplastic lesions Acute blood anemia on chronic anemia secondary to kidney  disease Patient is s/p small bowel enteroscopy significant for bleeding angiodysplastic lesions which were treated. Recommendations to restart Eliquis 5 days post procedure.  Hypoglycemia Unknown etiology. Patient is on Lantus, however, last dose was on 9/30. resolved. Discontinue Lantus on discharge secondary to low hemoglobin A1C and age.  Diabetes mellitus, type 2 Last hemoglobin A1C of 6.1%. Patient is on Lantus 10 units as an outpatient. Discontinued Lantus on discharge  Chronic diastolic heart failure Continued metoprolol. Holding lasix on discharge with recommendation to decrease dose. Repeat BMP as an outpatient.  Atrial fibrillation Currently in atrial fibrillation. Normal rate. On metoprolol and Eliquis as an outpatient. Metoprolol continued and Eliquis held for 5 days post procedure. Restart on 09/17/2018.  Acute kidney injury on CKD 4 Baseline creatinine of 1.6. Creatinine of 2.79 on admission. In setting of low blood volume in addition to Lasix use. Was improving, however, trended up slightly. Concern for continued worsening kidney injury. Patient follows with [facility] as an outpatient. Similar presentation on last admission requiring IV fluids for resolution of AKI. Creatinine improving towards baseline prior to discharge. Ultrasound unremarkable. Recommend holding lasix and repeat BMP on 09/18/2018 as an outpatient.  Acute blood loss anemia in setting of chronic anemia Secondary to GI bleeding in setting of long standing anemia from renal disease.  Discharge Diagnoses:  Principal Problem:   GI bleed Active Problems:   Essential hypertension   Diabetes mellitus type 2, controlled (HCC)   CKD (chronic kidney disease) stage 4, GFR 15-29 ml/min (HCC)   Paroxysmal atrial fibrillation (HCC)   Chronic diastolic CHF (congestive heart failure) (HCC)   Atrial fibrillation, chronic   GIB (gastrointestinal  bleeding)    Discharge Instructions  Discharge Instructions    Diet -  low sodium heart healthy   Complete by:  As directed    Increase activity slowly   Complete by:  As directed      Allergies as of 09/15/2018      Reactions   Penicillins Hives, Shortness Of Breath   Tolerates Ceftin, Ceftriaxone, and Cefepime Has patient had a PCN reaction causing immediate rash, facial/tongue/throat swelling, SOB or lightheadedness with hypotension: Y Has patient had a PCN reaction causing severe rash involving mucus membranes or skin necrosis: Y Has patient had a PCN reaction that required hospitalization: Y Has patient had a PCN reaction occurring within the last 10 years: N      Medication List    STOP taking these medications   COLCRYS 0.6 MG tablet Generic drug:  colchicine   LANTUS SOLOSTAR 100 UNIT/ML Solostar Pen Generic drug:  Insulin Glargine   polyethylene glycol packet Commonly known as:  MIRALAX / GLYCOLAX     TAKE these medications   albuterol (2.5 MG/3ML) 0.083% nebulizer solution Commonly known as:  PROVENTIL Take 2.5 mg by nebulization every 6 (six) hours as needed for wheezing or shortness of breath.   albuterol 108 (90 Base) MCG/ACT inhaler Commonly known as:  PROVENTIL HFA;VENTOLIN HFA Inhale 1-2 puffs into the lungs every 4 (four) hours as needed for wheezing or shortness of breath.   allopurinol 100 MG tablet Commonly known as:  ZYLOPRIM Take 100 mg by mouth daily.   apixaban 5 MG Tabs tablet Commonly known as:  ELIQUIS Take 1 tablet (5 mg total) by mouth 2 (two) times daily. Start taking on:  09/17/2018 What changed:  These instructions start on 09/17/2018. If you are unsure what to do until then, ask your doctor or other care provider.   cyclobenzaprine 10 MG tablet Commonly known as:  FLEXERIL Take 10 mg by mouth 3 (three) times daily as needed for muscle spasms.   docusate sodium 100 MG capsule Commonly known as:  COLACE Take 100 mg by mouth 2 (two) times daily as needed for mild constipation.   famotidine 20 MG  tablet Commonly known as:  PEPCID Take 20 mg by mouth at bedtime.   furosemide 80 MG tablet Commonly known as:  LASIX Take 0.5 tablets (40 mg total) by mouth 2 (two) times daily. Start taking on:  09/18/2018 What changed:    how much to take  These instructions start on 09/18/2018. If you are unsure what to do until then, ask your doctor or other care provider.   guaiFENesin 600 MG 12 hr tablet Commonly known as:  MUCINEX Take 600 mg by mouth 2 (two) times daily as needed (congestion).   isosorbide-hydrALAZINE 20-37.5 MG tablet Commonly known as:  BIDIL Take 1 tablet by mouth 3 (three) times daily.   metoprolol tartrate 50 MG tablet Commonly known as:  LOPRESSOR Take 1 tablet (50 mg total) by mouth 2 (two) times daily.   pantoprazole 40 MG tablet Commonly known as:  PROTONIX Take 1 tablet (40 mg total) by mouth daily.   pravastatin 80 MG tablet Commonly known as:  PRAVACHOL Take 80 mg by mouth daily.   SYMBICORT 80-4.5 MCG/ACT inhaler Generic drug:  budesonide-formoterol Inhale 2 puffs into the lungs 2 (two) times daily.      Follow-up Information    Nolene Ebbs, MD. Schedule an appointment as soon as possible for a visit in 3 day(s).   Specialty:  Internal Medicine  Why:  Labs (metabolic panel) Contact information: [place] [id number] [phone]          Allergies  Allergen Reactions  . Penicillins Hives and Shortness Of Breath    Tolerates Ceftin, Ceftriaxone, and Cefepime Has patient had a PCN reaction causing immediate rash, facial/tongue/throat swelling, SOB or lightheadedness with hypotension: Y Has patient had a PCN reaction causing severe rash involving mucus membranes or skin necrosis: Y Has patient had a PCN reaction that required hospitalization: Y Has patient had a PCN reaction occurring within the last 10 years: N     Consultations:  Gastroenterology   Procedures/Studies: Ct Abdomen Pelvis Wo Contrast  Result  Date: 09/12/2018 CLINICAL DATA:  Abdominal pain and tarry stools for 3 days. On Eliquis. History of cholecystectomy. EXAM: CT ABDOMEN AND PELVIS WITHOUT CONTRAST TECHNIQUE: Multidetector CT imaging of the abdomen and pelvis was performed following the standard protocol without IV contrast. COMPARISON:  CT abdomen and pelvis June 22, 2018 FINDINGS: LOWER CHEST: Cardiomegaly. Coronary artery calcifications. No pericardial effusion. HEPATOBILIARY: Calcified vessel LEFT lobe of the liver, unremarkable. Status post cholecystectomy. PANCREAS: Normal. SPLEEN: Normal. ADRENALS/URINARY TRACT: Kidneys are orthotopic, demonstrating normal size. LEFT renal scarring. Bilateral renal vascular calcifications, superimposed punctate LEFT potential nephrolithiasis. No hydronephrosis; limited assessment for renal masses by nonenhanced CT. 23 mm upper pole and 15 mm interpolar RIGHT renal cyst. The unopacified ureters are normal in course and caliber. Urinary bladder is partially distended and unremarkable. Similarly thickened adrenal glands seen with hyperplasia. STOMACH/BOWEL: The stomach, small and large bowel are normal in course and caliber without inflammatory changes, sensitivity decreased by lack of enteric contrast. Mild colonic diverticulosis. Rectal air-fluid level. Normal appendix. VASCULAR/LYMPHATIC: Aortoiliac vessels are normal in course and caliber. Moderate to severe calcific atherosclerosis. No lymphadenopathy by CT size criteria. REPRODUCTIVE: Normal. OTHER: No intraperitoneal free fluid or free air. Moderate fat containing inguinal hernias, a knuckle of transverse colon seen at the hernia neck (Richter hernia). MUSCULOSKELETAL: Non-acute. Severe sacroiliac osteoarthrosis. Severe facet arthropathy. Grade 1 L4-5 anterolisthesis without spondylolysis. IMPRESSION: 1. Rectal air-fluid level seen with enteritis. 2. Colonic diverticulosis without acute diverticulitis. Aortic Atherosclerosis (ICD10-I70.0). Electronically  Signed   By: Elon Alas M.D.   On: 09/12/2018 00:46   US Renal  Result Date: 09/14/2018 CLINICAL DATA:  Acute renal injury EXAM: RENAL / URINARY TRACT ULTRASOUND COMPLETE COMPARISON:  Multiple CTs dated 09/11/18 and 06/22/2018 and 08/15/2015 FINDINGS: Right Kidney: Length: 10 cm. Multiple cysts are noted. The largest of these measures 2.5 cm in the upper pole similar to that seen on previous examinations. No obstructive changes are seen. The previously noted vascular calcifications are not well appreciated. Left Kidney: Length: 8.7 cm. Single 1.2 cm cyst is noted in the lower pole of the left kidney. No obstructive changes are seen. No calculi are identified. Bladder: Appears normal for degree of bladder distention. IMPRESSION: Bilateral renal cysts.  No other focal abnormality is noted. Electronically Signed   By: Inez Catalina M.D.   On: 09/14/2018 16:27      10/1: Small bowel enteroscopy Impression:               - Multiple bleeding angiodysplastic lesions in the                            duodenum. Treated with a monopolar probe.                           -  A few bleeding angiodysplastic lesions in the                            jejunum. Treated with a monopolar probe.                           - No specimens collected. Recommendation:           - Return patient to hospital ward for ongoing care.                           - Resume regular diet.                           - Follow HGB and transfuse as necessary.                           Restart Eliquis in 5 days.   Subjective: No issues today.  Discharge Exam: Vitals:   09/15/18 0539 09/15/18 0924  BP: 135/77   Pulse: 83   Resp: 17   Temp: 97.6 F (36.4 C)   SpO2: 100% 100%   Vitals:   09/14/18 2040 09/14/18 2132 09/15/18 0539 09/15/18 0924  BP: 101/73  135/77   Pulse: 76  83   Resp: 18  17   Temp: 97.6 F (36.4 C)  97.6 F (36.4 C)   TempSrc:      SpO2: 100% 100% 100% 100%  Weight:   95.6 kg   Height:         General: Pt is alert, awake, not in acute distress Cardiovascular: irregular rhythm and normal rate, S1/S2 +, no rubs, no gallops Respiratory: CTA bilaterally, no wheezing, no rhonchi Abdominal: Soft, NT, ND, bowel sounds + Extremities: no edema, no cyanosis    The results of significant diagnostics from this hospitalization (including imaging, microbiology, ancillary and laboratory) are listed below for reference.     Microbiology: No results found for this or any previous visit (from the past 240 hour(s)).   Labs: BNP (last 3 results) No results for input(s): BNP in the last 8760 hours. Basic Metabolic Panel: Recent Labs  Lab 09/11/18 1559 09/12/18 0108 09/12/18 0620 09/12/18 1610 09/13/18 0544 09/14/18 0821 09/15/18 0549  NA 143  --  141  --  140 140 143  K 3.3*  --  2.9* 3.6 3.9 3.8 3.9  CL 102  --  101  --  103 106 108  CO2 28  --  29  --  26 25 23   GLUCOSE 77  --  93  --  73 98 106*  BUN 51*  --  43*  --  40* 37* 34*  CREATININE 2.79*  --  2.55*  --  2.44* 2.49* 2.36*  CALCIUM 10.5*  --  9.9  --  9.3 9.3 9.5  MG  --  1.8  --   --   --   --   --    Liver Function Tests: Recent Labs  Lab 09/11/18 1559 09/12/18 0620  AST 43* 52*  ALT 41 42  ALKPHOS 88 80  BILITOT 2.0* 1.9*  PROT 7.6 6.7  ALBUMIN 3.8 3.4*   No results for input(s): LIPASE, AMYLASE in the last 168 hours. No results for input(s): AMMONIA in the last 168 hours. CBC: Recent Labs  Lab 09/11/18 1559 09/12/18 0108 09/12/18 0620 09/13/18 0544 09/15/18 0549  WBC 6.4  --  6.4 7.0 5.0  HGB 8.1* 8.7* 8.0* 7.9* 8.0*  HCT 27.0* 28.3* 26.1* 25.7* 26.5*  MCV 73.4*  --  73.1* 73.0* 73.4*  PLT 256  --  201 189 173   Cardiac Enzymes: No results for input(s): CKTOTAL, CKMB, CKMBINDEX, TROPONINI in the last 168 hours. BNP: Invalid input(s): POCBNP CBG: Recent Labs  Lab 09/14/18 1140 09/14/18 1644 09/14/18 2115 09/15/18 0830 09/15/18 1112  GLUCAP 131* 115* 167* 122* 117*   D-Dimer No  results for input(s): DDIMER in the last 72 hours. Hgb A1c No results for input(s): HGBA1C in the last 72 hours. Lipid Profile No results for input(s): CHOL, HDL, LDLCALC, TRIG, CHOLHDL, LDLDIRECT in the last 72 hours. Thyroid function studies No results for input(s): TSH, T4TOTAL, T3FREE, THYROIDAB in the last 72 hours.  Invalid input(s): FREET3 Anemia work up No results for input(s): VITAMINB12, FOLATE, FERRITIN, TIBC, IRON, RETICCTPCT in the last 72 hours. Urinalysis    Component Value Date/Time   COLORURINE STRAW (A) 09/11/2018 2023   APPEARANCEUR CLEAR 09/11/2018 2023   LABSPEC 1.005 09/11/2018 2023   PHURINE 7.0 09/11/2018 2023   GLUCOSEU NEGATIVE 09/11/2018 2023   HGBUR MODERATE (A) 09/11/2018 2023   BILIRUBINUR NEGATIVE 09/11/2018 2023   KETONESUR NEGATIVE 09/11/2018 2023   PROTEINUR 30 (A) 09/11/2018 2023   UROBILINOGEN 1.0 08/15/2015 0915   NITRITE NEGATIVE 09/11/2018 2023   LEUKOCYTESUR NEGATIVE 09/11/2018 2023     SIGNED:   Cordelia Poche, MD [facility] 09/15/2018, 12:30 PM

## 2018-09-15 NOTE — Discharge Instructions (Addendum)
Kathleen [name],  You were admitted because of bleeding from your GI system. This was fixed by the gastroenterologist. Please follow-up with him as an outpatient and resume your Eliquis on 09/17/2018. Your kidney function was impaired likely because of your blood loss and lasix. You were given IV fluids and your lasix was held. I recommend holding your lasix until 09/18/2018 and following up with your primary care physician for labs on 09/18/2018 to check your kidney function.

## 2018-09-15 NOTE — Care Management Important Message (Signed)
Important Message  Patient Details  Name: Kathleen [name] MRN: [id number] [name] of Birth: 11/23/1946   Medicare Important Message Given:  Yes    Kerin Salen 09/15/2018, 10:57 AMImportant Message  Patient Details  Name: Kathleen [name] MRN: [id number] [name] of Birth: [birth year]   Medicare Important Message Given:  Yes    Kerin Salen 09/15/2018, 10:56 AM

## 2018-09-15 NOTE — Care Management Note (Signed)
Case Management Note  Patient Details  Name: Kathleen [name] MRN: [id number] Date of Birth: 07-14-1946  Subjective/Objective:                  Discharge planning-discharged to home with self care  Action/Plan: No cm needs present.  Expected Discharge Date:  09/15/18               Expected Discharge Plan:  Home/Self Care  In-House Referral:     Discharge planning Services  CM Consult  Post Acute Care Choice:    Choice offered to:     DME Arranged:    DME Agency:     HH Arranged:    HH Agency:     Status of Service:  Completed, signed off  If discussed at [employer] of Stay Meetings, dates discussed:    Additional Comments:  Leeroy Cha, RN 09/15/2018, 12:52 PM

## 2018-09-28 ENCOUNTER — Other Ambulatory Visit: Payer: Self-pay | Admitting: Internal Medicine

## 2018-09-28 DIAGNOSIS — Z1231 Encounter for screening mammogram for malignant neoplasm of breast: Secondary | ICD-10-CM

## 2018-10-20 ENCOUNTER — Ambulatory Visit (INDEPENDENT_AMBULATORY_CARE_PROVIDER_SITE_OTHER): Payer: Medicare Other | Admitting: Podiatry

## 2018-10-20 DIAGNOSIS — B351 Tinea unguium: Secondary | ICD-10-CM | POA: Diagnosis not present

## 2018-10-20 DIAGNOSIS — M79675 Pain in left toe(s): Secondary | ICD-10-CM

## 2018-10-20 DIAGNOSIS — M79674 Pain in right toe(s): Secondary | ICD-10-CM | POA: Diagnosis not present

## 2018-10-20 NOTE — Patient Instructions (Addendum)

## 2018-11-03 ENCOUNTER — Ambulatory Visit
Admission: RE | Admit: 2018-11-03 | Discharge: 2018-11-03 | Disposition: A | Discharge status: Home / Self Care | Payer: Medicare Other | Source: Ambulatory Visit | Attending: Internal Medicine | Admitting: Internal Medicine

## 2018-11-03 DIAGNOSIS — Z1231 Encounter for screening mammogram for malignant neoplasm of breast: Secondary | ICD-10-CM

## 2018-11-06 ENCOUNTER — Encounter: Payer: Self-pay | Admitting: Podiatry

## 2018-11-06 NOTE — Progress Notes (Signed)
Subjective: Kathleen [name] presents today with painful, thick toenails 1-5 b/l that she cannot cut and which interfere with daily activities.  Pain is aggravated when wearing enclosed shoe gear.  Today, she states her right great toe is painful due to the elongted toenail. She points to the medial border. She denies any drainage, redness, or swelling of digit.  Objective: Vascular Examination: Capillary refill time immediate x 10 digits Dorsalis pedis 1/4 b/l Posterior tibial pulses 0/4 b/l Digital hair x 10 digits absent b/l Skin temperature gradient WNL b/l +Edema BLE  Dermatological Examination: Skin with no wounds and no interdigital maceration  Toenails 1-5 b/l discolored, thick, dystrophic with subungual debris and pain with palpation to nailbeds due to thickness of nails. +Tenderness to right great toe medial border due to incurvated nail plate. No edema, no erythema, no drainage.  Musculoskeletal: Muscle strength 5/5 to all LE muscle groups  Neurological: Sensation intact with 10 gram monofilament. Vibratory sensation intact.  Assessment: Painful onychomycosis toenails 1-5 b/l   Plan: 1. Toenails 1-5 b/l were debrided in length and girth without iatrogenic bleeding. Offending nail border debrided and curretaged. Border cleansed with alcohol. Triple antibiotic ointment applied. Patient noted relief post debridement. No further treatment required by patient. 2. Patient to continue soft, supportive shoe gear 3. Patient to report any pedal injuries to medical professional immediately. 4. Follow up 3 months. Patient/POA to call should there be a concern in the interim.

## 2018-12-19 ENCOUNTER — Encounter (HOSPITAL_COMMUNITY): Payer: Self-pay | Admitting: *Deleted

## 2018-12-19 ENCOUNTER — Emergency Department (HOSPITAL_COMMUNITY): Payer: Medicare Other

## 2018-12-19 ENCOUNTER — Other Ambulatory Visit: Payer: Self-pay

## 2018-12-19 ENCOUNTER — Inpatient Hospital Stay (HOSPITAL_COMMUNITY)
Admission: EM | Admit: 2018-12-19 | Discharge: 2018-12-22 | DRG: 378 | Disposition: A | Discharge status: Home / Self Care | Payer: Medicare Other | Attending: Internal Medicine | Admitting: Internal Medicine

## 2018-12-19 DIAGNOSIS — E1122 Type 2 diabetes mellitus with diabetic chronic kidney disease: Secondary | ICD-10-CM | POA: Diagnosis present

## 2018-12-19 DIAGNOSIS — Z794 Long term (current) use of insulin: Secondary | ICD-10-CM | POA: Diagnosis not present

## 2018-12-19 DIAGNOSIS — Z87891 Personal history of nicotine dependence: Secondary | ICD-10-CM | POA: Diagnosis not present

## 2018-12-19 DIAGNOSIS — N179 Acute kidney failure, unspecified: Secondary | ICD-10-CM | POA: Diagnosis present

## 2018-12-19 DIAGNOSIS — I130 Hypertensive heart and chronic kidney disease with heart failure and stage 1 through stage 4 chronic kidney disease, or unspecified chronic kidney disease: Secondary | ICD-10-CM | POA: Diagnosis present

## 2018-12-19 DIAGNOSIS — N184 Chronic kidney disease, stage 4 (severe): Secondary | ICD-10-CM | POA: Diagnosis present

## 2018-12-19 DIAGNOSIS — I4892 Unspecified atrial flutter: Secondary | ICD-10-CM | POA: Diagnosis present

## 2018-12-19 DIAGNOSIS — Z8261 Family history of arthritis: Secondary | ICD-10-CM | POA: Diagnosis not present

## 2018-12-19 DIAGNOSIS — Z7951 Long term (current) use of inhaled steroids: Secondary | ICD-10-CM | POA: Diagnosis not present

## 2018-12-19 DIAGNOSIS — I5032 Chronic diastolic (congestive) heart failure: Secondary | ICD-10-CM | POA: Diagnosis present

## 2018-12-19 DIAGNOSIS — Z880 Allergy status to penicillin: Secondary | ICD-10-CM | POA: Diagnosis not present

## 2018-12-19 DIAGNOSIS — I482 Chronic atrial fibrillation, unspecified: Secondary | ICD-10-CM | POA: Diagnosis present

## 2018-12-19 DIAGNOSIS — G4730 Sleep apnea, unspecified: Secondary | ICD-10-CM | POA: Diagnosis present

## 2018-12-19 DIAGNOSIS — I2510 Atherosclerotic heart disease of native coronary artery without angina pectoris: Secondary | ICD-10-CM | POA: Diagnosis present

## 2018-12-19 DIAGNOSIS — N182 Chronic kidney disease, stage 2 (mild): Secondary | ICD-10-CM

## 2018-12-19 DIAGNOSIS — E119 Type 2 diabetes mellitus without complications: Secondary | ICD-10-CM

## 2018-12-19 DIAGNOSIS — R0602 Shortness of breath: Secondary | ICD-10-CM | POA: Diagnosis present

## 2018-12-19 DIAGNOSIS — K921 Melena: Secondary | ICD-10-CM | POA: Diagnosis present

## 2018-12-19 DIAGNOSIS — D500 Iron deficiency anemia secondary to blood loss (chronic): Secondary | ICD-10-CM | POA: Diagnosis not present

## 2018-12-19 DIAGNOSIS — Z9049 Acquired absence of other specified parts of digestive tract: Secondary | ICD-10-CM

## 2018-12-19 DIAGNOSIS — Z7901 Long term (current) use of anticoagulants: Secondary | ICD-10-CM

## 2018-12-19 DIAGNOSIS — I480 Paroxysmal atrial fibrillation: Secondary | ICD-10-CM

## 2018-12-19 DIAGNOSIS — E11649 Type 2 diabetes mellitus with hypoglycemia without coma: Secondary | ICD-10-CM

## 2018-12-19 DIAGNOSIS — Z8249 Family history of ischemic heart disease and other diseases of the circulatory system: Secondary | ICD-10-CM | POA: Diagnosis not present

## 2018-12-19 DIAGNOSIS — Z79899 Other long term (current) drug therapy: Secondary | ICD-10-CM | POA: Diagnosis not present

## 2018-12-19 DIAGNOSIS — I4819 Other persistent atrial fibrillation: Secondary | ICD-10-CM | POA: Diagnosis present

## 2018-12-19 DIAGNOSIS — Z96659 Presence of unspecified artificial knee joint: Secondary | ICD-10-CM | POA: Diagnosis present

## 2018-12-19 DIAGNOSIS — I10 Essential (primary) hypertension: Secondary | ICD-10-CM | POA: Diagnosis present

## 2018-12-19 DIAGNOSIS — K31819 Angiodysplasia of stomach and duodenum without bleeding: Secondary | ICD-10-CM | POA: Diagnosis present

## 2018-12-19 DIAGNOSIS — R195 Other fecal abnormalities: Secondary | ICD-10-CM

## 2018-12-19 DIAGNOSIS — K219 Gastro-esophageal reflux disease without esophagitis: Secondary | ICD-10-CM | POA: Diagnosis present

## 2018-12-19 DIAGNOSIS — K5521 Angiodysplasia of colon with hemorrhage: Principal | ICD-10-CM | POA: Diagnosis present

## 2018-12-19 DIAGNOSIS — D649 Anemia, unspecified: Secondary | ICD-10-CM | POA: Diagnosis present

## 2018-12-19 DIAGNOSIS — N189 Chronic kidney disease, unspecified: Secondary | ICD-10-CM | POA: Diagnosis present

## 2018-12-19 DIAGNOSIS — D62 Acute posthemorrhagic anemia: Secondary | ICD-10-CM | POA: Diagnosis present

## 2018-12-19 LAB — COMPREHENSIVE METABOLIC PANEL
ALT: 18 U/L (ref 0–44)
AST: 20 U/L (ref 15–41)
Albumin: 3.5 g/dL (ref 3.5–5.0)
Alkaline Phosphatase: 112 U/L (ref 38–126)
Anion gap: 11 (ref 5–15)
BUN: 66 mg/dL — AB (ref 8–23)
CALCIUM: 9.3 mg/dL (ref 8.9–10.3)
CO2: 25 mmol/L (ref 22–32)
Chloride: 99 mmol/L (ref 98–111)
Creatinine, Ser: 3.41 mg/dL — ABNORMAL HIGH (ref 0.44–1.00)
GFR, EST AFRICAN AMERICAN: 15 mL/min — AB (ref >60)
GFR, EST NON AFRICAN AMERICAN: 13 mL/min — AB (ref >60)
Glucose, Bld: 139 mg/dL — ABNORMAL HIGH (ref 70–99)
POTASSIUM: 3.5 mmol/L (ref 3.5–5.1)
Sodium: 135 mmol/L (ref 135–145)
TOTAL PROTEIN: 7.4 g/dL (ref 6.5–8.1)
Total Bilirubin: 1.8 mg/dL — ABNORMAL HIGH (ref 0.3–1.2)

## 2018-12-19 LAB — PREPARE RBC (CROSSMATCH)

## 2018-12-19 LAB — CBC
HCT: 19 % — ABNORMAL LOW (ref 36.0–46.0)
HEMOGLOBIN: 5.2 g/dL — AB (ref 12.0–15.0)
MCH: 18.8 pg — ABNORMAL LOW (ref 26.0–34.0)
MCHC: 27.4 g/dL — ABNORMAL LOW (ref 30.0–36.0)
MCV: 68.8 fL — ABNORMAL LOW (ref 80.0–100.0)
NRBC: 1.6 % — AB (ref 0.0–0.2)
PLATELETS: 181 10*3/uL (ref 150–400)
RBC: 2.76 MIL/uL — AB (ref 3.87–5.11)
RDW: 23.9 % — ABNORMAL HIGH (ref 11.5–15.5)
WBC: 7.1 10*3/uL (ref 4.0–10.5)

## 2018-12-19 LAB — I-STAT TROPONIN, ED: Troponin i, poc: 0.01 ng/mL (ref 0.00–0.08)

## 2018-12-19 LAB — BRAIN NATRIURETIC PEPTIDE: B Natriuretic Peptide: 863.6 pg/mL — ABNORMAL HIGH (ref 0.0–100.0)

## 2018-12-19 LAB — POC OCCULT BLOOD, ED: FECAL OCCULT BLD: POSITIVE — AB

## 2018-12-19 LAB — GLUCOSE, CAPILLARY: Glucose-Capillary: 141 mg/dL — ABNORMAL HIGH (ref 70–99)

## 2018-12-19 MED ORDER — ACETAMINOPHEN 325 MG PO TABS
650.0000 mg | ORAL_TABLET | Freq: Four times a day (QID) | ORAL | Status: DC | PRN
  Administered 2018-12-21: 650 mg via ORAL
  Filled 2018-12-19: qty 2

## 2018-12-19 MED ORDER — INSULIN GLARGINE 100 UNIT/ML ~~LOC~~ SOLN
5.0000 [IU] | Freq: Every day | SUBCUTANEOUS | Status: DC
  Administered 2018-12-20: 5 [IU] via SUBCUTANEOUS
  Filled 2018-12-19: qty 0.05

## 2018-12-19 MED ORDER — SODIUM CHLORIDE 0.9% IV SOLUTION
Freq: Once | INTRAVENOUS | Status: AC
  Administered 2018-12-20: via INTRAVENOUS

## 2018-12-19 MED ORDER — INSULIN GLARGINE 100 UNIT/ML ~~LOC~~ SOLN: 5.0000 [IU] | Freq: Every day | SUBCUTANEOUS | Status: DC

## 2018-12-19 MED ORDER — INSULIN ASPART 100 UNIT/ML ~~LOC~~ SOLN
0.0000 [IU] | SUBCUTANEOUS | Status: DC
  Administered 2018-12-19 – 2018-12-21 (×2): 1 [IU] via SUBCUTANEOUS

## 2018-12-19 MED ORDER — METOPROLOL TARTRATE 50 MG PO TABS
75.0000 mg | ORAL_TABLET | Freq: Two times a day (BID) | ORAL | Status: DC
  Administered 2018-12-19 – 2018-12-22 (×6): 75 mg via ORAL
  Filled 2018-12-19 (×6): qty 1

## 2018-12-19 MED ORDER — ONDANSETRON HCL 4 MG PO TABS: 4.0000 mg | ORAL_TABLET | Freq: Four times a day (QID) | ORAL | Status: DC | PRN

## 2018-12-19 MED ORDER — ALBUTEROL SULFATE (2.5 MG/3ML) 0.083% IN NEBU: 2.5000 mg | INHALATION_SOLUTION | Freq: Four times a day (QID) | RESPIRATORY_TRACT | Status: DC | PRN

## 2018-12-19 MED ORDER — PRAVASTATIN SODIUM 40 MG PO TABS
80.0000 mg | ORAL_TABLET | Freq: Every day | ORAL | Status: DC
  Administered 2018-12-20 – 2018-12-22 (×3): 80 mg via ORAL
  Filled 2018-12-19 (×3): qty 2

## 2018-12-19 MED ORDER — MOMETASONE FURO-FORMOTEROL FUM 100-5 MCG/ACT IN AERO
2.0000 | INHALATION_SPRAY | Freq: Two times a day (BID) | RESPIRATORY_TRACT | Status: DC
  Administered 2018-12-20 – 2018-12-22 (×3): 2 via RESPIRATORY_TRACT
  Filled 2018-12-19 (×4): qty 8.8

## 2018-12-19 MED ORDER — FAMOTIDINE 20 MG PO TABS
20.0000 mg | ORAL_TABLET | Freq: Every day | ORAL | Status: DC
  Administered 2018-12-19 – 2018-12-21 (×3): 20 mg via ORAL
  Filled 2018-12-19 (×3): qty 1

## 2018-12-19 MED ORDER — ALLOPURINOL 100 MG PO TABS
100.0000 mg | ORAL_TABLET | Freq: Every day | ORAL | Status: DC
  Administered 2018-12-20 – 2018-12-22 (×3): 100 mg via ORAL
  Filled 2018-12-19 (×3): qty 1

## 2018-12-19 MED ORDER — CYCLOBENZAPRINE HCL 10 MG PO TABS
10.0000 mg | ORAL_TABLET | Freq: Two times a day (BID) | ORAL | Status: DC | PRN
  Administered 2018-12-21: 10 mg via ORAL
  Filled 2018-12-19: qty 1

## 2018-12-19 MED ORDER — DOCUSATE SODIUM 100 MG PO CAPS: 100.0000 mg | ORAL_CAPSULE | Freq: Two times a day (BID) | ORAL | Status: DC | PRN

## 2018-12-19 MED ORDER — PANTOPRAZOLE SODIUM 40 MG PO TBEC
40.0000 mg | DELAYED_RELEASE_TABLET | Freq: Every day | ORAL | Status: DC
  Administered 2018-12-20 – 2018-12-22 (×3): 40 mg via ORAL
  Filled 2018-12-19 (×3): qty 1

## 2018-12-19 MED ORDER — ONDANSETRON HCL 4 MG/2ML IJ SOLN: 4.0000 mg | Freq: Four times a day (QID) | INTRAMUSCULAR | Status: DC | PRN

## 2018-12-19 MED ORDER — ACETAMINOPHEN 650 MG RE SUPP: 650.0000 mg | Freq: Four times a day (QID) | RECTAL | Status: DC | PRN

## 2018-12-19 NOTE — ED Triage Notes (Signed)
Pt was told to come to ED for blood transfusion. Pt had blood drawn yesterday at PCP office. Pt states she has felt short of breath, states stools have been normal. Pt has hx of GI bleeding.

## 2018-12-19 NOTE — ED Notes (Signed)
ED TO INPATIENT HANDOFF REPORT  Name/Age/Gender Kathleen [name] 73 y.o. female  Code Status    Code Status Orders  (From admission, onward)         Start     Ordered   12/19/18 2056  Full code  Continuous     12/19/18 2056        Code Status History    Date Active Date Inactive Code Status Order ID Comments User Context   09/12/2018 0108 09/15/2018 1723 Full Code [id number]  Bethena Roys, MD Inpatient   06/22/2018 1837 06/29/2018 1702 Full Code [id number]  [name], MD ED   06/04/2018 1646 06/07/2018 2016 Full Code [id number]  [name], DO Inpatient   12/04/2017 0752 12/06/2017 1738 Full Code [id number]  [name], NP ED   01/23/2015 0608 01/28/2015 1636 Full Code [id number]  [name], MD ED   09/17/2014 1036 09/19/2014 1847 Full Code [id number]  [name], MD Inpatient   11/02/2011 1136 11/04/2011 1452 Full Code [id number]  [name], RN Inpatient      Home/SNF/Other Home  Chief Complaint blood transfusion urgent care sent pt  Level of Care/Admitting Diagnosis ED Disposition    ED Disposition Condition [place]: [facility] [100102]  Level of Care: Telemetry [5]  Admit to tele based on following criteria: Other see comments  Comments: severe anemia  Diagnosis: Symptomatic anemia [8588502]  Admitting Physician: Doreatha Massed  Attending Physician: Etta Quill [phone]  Estimated length of stay: past midnight tomorrow  Certification:: I certify this patient will need inpatient services for at least 2 midnights  PT Class (Do Not Modify): Inpatient [101]  PT Acc Code (Do Not Modify): Private [1]       Medical History Past Medical History:  Diagnosis Date  . Arthritis   . Blood transfusion    no side affects  . CHF (congestive heart failure) ([place])   . Coronary artery disease    Cypher stent to the RCA in 2003  . Diabetes mellitus    Borderline  . GERD  (gastroesophageal reflux disease)   . Hypertension   . Shortness of breath   . Sleep apnea     Allergies Allergies  Allergen Reactions  . Penicillins Hives and Shortness Of Breath    Tolerates Ceftin, Ceftriaxone, and Cefepime Has patient had a PCN reaction causing immediate rash, facial/tongue/throat swelling, SOB or lightheadedness with hypotension: Y Has patient had a PCN reaction causing severe rash involving mucus membranes or skin necrosis: Y Has patient had a PCN reaction that required hospitalization: Y Has patient had a PCN reaction occurring within the last 10 years: N     IV Location/Drains/Wounds Patient Lines/Drains/Airways Status   Active Line/Drains/Airways    Name:   Placement date:   Placement time:   Site:   Days:   Pressure Injury 06/22/18 Stage II -  Partial thickness loss of dermis presenting as a shallow open ulcer with a red, pink wound bed without slough.   06/22/18    2030     180          Labs/Imaging Results for orders placed or performed during the hospital encounter of 12/19/18 (from the past 48 hour(s))  Comprehensive metabolic panel     Status: Abnormal   Collection Time: 12/19/18  6:52 PM  Result Value Ref Range   Sodium 135 135 - 145 mmol/L   Potassium 3.5 3.5 -  5.1 mmol/L   Chloride 99 98 - 111 mmol/L   CO2 25 22 - 32 mmol/L   Glucose, Bld 139 (H) 70 - 99 mg/dL   BUN 66 (H) 8 - 23 mg/dL   Creatinine, Ser 3.41 (H) 0.44 - 1.00 mg/dL   Calcium 9.3 8.9 - 10.3 mg/dL   Total Protein 7.4 6.5 - 8.1 g/dL   Albumin 3.5 3.5 - 5.0 g/dL   AST 20 15 - 41 U/L   ALT 18 0 - 44 U/L   Alkaline Phosphatase 112 38 - 126 U/L   Total Bilirubin 1.8 (H) 0.3 - 1.2 mg/dL   GFR calc non Af Amer 13 (L) >60 mL/min   GFR calc Af Amer 15 (L) >60 mL/min   Anion gap 11 5 - 15    Comment: Performed at [facility], [place] [address], [place], [place] [address]  CBC     Status: Abnormal   Collection Time: 12/19/18  6:52 PM  Result Value Ref Range    WBC 7.1 4.0 - 10.5 K/uL   RBC 2.76 (L) 3.87 - 5.11 MIL/uL   Hemoglobin 5.2 (LL) 12.0 - 15.0 g/dL    Comment: Reticulocyte Hemoglobin testing may be clinically indicated, consider ordering this additional test [id number] THIS CRITICAL RESULT HAS VERIFIED AND BEEN CALLED TO JEN HAMILTON,RN BY MARVIN SCOTTON ON 01 07 2020 AT 1918, AND HAS BEEN READ BACK.     HCT 19.0 (L) 36.0 - 46.0 %   MCV 68.8 (L) 80.0 - 100.0 fL   MCH 18.8 (L) 26.0 - 34.0 pg   MCHC 27.4 (L) 30.0 - 36.0 g/dL   RDW 23.9 (H) 11.5 - 15.5 %   Platelets 181 150 - 400 K/uL   nRBC 1.6 (H) 0.0 - 0.2 %    Comment: Performed at [facility], [place] [address] [place], [place] [address]  Type and screen [place]     Status: None   Collection Time: 12/19/18  6:53 PM  Result Value Ref Range   ABO/RH(D) O POS    Antibody Screen NEG    Sample Expiration      12/22/2018 Performed at [facility], [place] [address] [place], [place] [address]   POC occult blood, ED Provider will collect     Status: Abnormal   Collection Time: 12/19/18  8:25 PM  Result Value Ref Range   Fecal Occult Bld POSITIVE (A) NEGATIVE   Dg Chest 2 View  Result Date: 12/19/2018 CLINICAL DATA:  Shortness of breath. Severe anemia. EXAM: CHEST - 2 VIEW COMPARISON:  06/22/2018 FINDINGS: Stable moderate cardiomegaly. Aortic atherosclerosis. Pulmonary vascular congestion noted, however there is no evidence of pulmonary infiltrate or edema. No evidence of pleural effusion. IMPRESSION: Cardiomegaly and pulmonary vascular congestion. No active lung disease. Electronically Signed   By: Earle Gell M.D.   On: 12/19/2018 20:54   None  Pending Labs Unresulted Labs (From admission, onward)    Start     Ordered   12/20/18 0500  CBC  Tomorrow morning,   R     12/19/18 2056   12/20/18 4854  Basic metabolic panel  Tomorrow morning,   R     12/19/18 2056   12/19/18 2020  Brain natriuretic peptide  Once,   R      12/19/18 2020   12/19/18 2019  Prepare RBC  (Adult Blood Administration - Red Blood Cells)  Once,   R    Question Answer  Comment  # of Units 2 units   Transfusion Indications Symptomatic Anemia   If emergent release call blood bank Not emergent release      12/19/18 2020          Vitals/Pain Today's Vitals   12/19/18 1832 12/19/18 1836  BP: (!) 117/52   Pulse: 71   Resp: 18   Temp: (!) 97.4 F (36.3 C)   TempSrc: Oral   SpO2: 94%   PainSc:  0-No pain    Isolation Precautions No active isolations  Medications Medications  0.9 %  sodium chloride infusion (Manually program via Guardrails IV Fluids) (has no administration in time range)  acetaminophen (TYLENOL) tablet 650 mg (has no administration in time range)    Or  acetaminophen (TYLENOL) suppository 650 mg (has no administration in time range)  ondansetron (ZOFRAN) tablet 4 mg (has no administration in time range)    Or  ondansetron (ZOFRAN) injection 4 mg (has no administration in time range)  insulin glargine (LANTUS) injection 5 Units (has no administration in time range)  insulin aspart (novoLOG) injection 0-9 Units (has no administration in time range)  metoprolol tartrate (LOPRESSOR) tablet 75 mg (has no administration in time range)  famotidine (PEPCID) tablet 20 mg (has no administration in time range)  albuterol (PROVENTIL) (2.5 MG/3ML) 0.083% nebulizer solution 2.5 mg (has no administration in time range)  cyclobenzaprine (FLEXERIL) tablet 10 mg (has no administration in time range)  docusate sodium (COLACE) capsule 100 mg (has no administration in time range)  pantoprazole (PROTONIX) EC tablet 40 mg (has no administration in time range)  mometasone-formoterol (DULERA) 100-5 MCG/ACT inhaler 2 puff (has no administration in time range)  pravastatin (PRAVACHOL) tablet 80 mg (has no administration in time range)  allopurinol (ZYLOPRIM) tablet 100 mg (has no administration in time range)     Mobility walks

## 2018-12-19 NOTE — H&P (Signed)
History and Physical    Kathleen [name]:[id number] DOB: March 29, 1946 DOA: 12/19/2018  PCP: Nolene Ebbs, MD  Patient coming from: Home  I have personally briefly reviewed patient's old medical records in [place]  Chief Complaint: DOE, SOB  HPI: Kathleen [name] is a 73 y.o. female with medical history significant of DM2, HTN, CKD stage 3-4, A.Flutter on Eliquis.  Patient was worked up for GI bleed 3 times last year (June, July, Sept).  Found gastric and small bowel AVMs.  After cautery, eliquis was resumed.  Patient went to PCPs office yesterday with c/o 1 month history of progressive fatigue, DOE, SOB.  No gross melena nor hematochezia.  Found to have low hemoglobin and sent in to ED.   ED Course: HGB 5.2 down from 8-9 at Oct admission last fall.  Hemoccult positive.  Creat up to 3.4 from 2.5 during Oct admission and 1.6 baseline during the summer last year.   Review of Systems: As per HPI otherwise 10 point review of systems negative.   Past Medical History:  Diagnosis Date  . Arthritis   . Blood transfusion    no side affects  . CHF (congestive heart failure) ([place])   . Coronary artery disease    Cypher stent to the RCA in 2003  . Diabetes mellitus    Borderline  . GERD (gastroesophageal reflux disease)   . Hypertension   . Shortness of breath   . Sleep apnea     Past Surgical History:  Procedure Laterality Date  . BIOPSY  06/06/2018   Procedure: BIOPSY;  Surgeon: Otis Brace, MD;  Location: [facility] ENDOSCOPY;  Service: Gastroenterology;;  . CARDIOVERSION N/A 03/17/2015   Procedure: CARDIOVERSION;  Surgeon: Sueanne Margarita, MD;  Location: [facility] ENDOSCOPY;  Service: Cardiovascular;  Laterality: N/A;  . CHOLECYSTECTOMY    . CORONARY ANGIOPLASTY WITH STENT PLACEMENT    . ENTEROSCOPY N/A 06/28/2018   Procedure: ENTEROSCOPY;  Surgeon: Carol Ada, MD;  Location: [facility];  Service: Endoscopy;  Laterality: N/A;  . ENTEROSCOPY N/A 09/12/2018   Procedure:  ENTEROSCOPY;  Surgeon: Carol Ada, MD;  Location: [facility];  Service: Endoscopy;  Laterality: N/A;  . ESOPHAGOGASTRODUODENOSCOPY (EGD) WITH PROPOFOL N/A 06/06/2018   Procedure: ESOPHAGOGASTRODUODENOSCOPY (EGD) WITH PROPOFOL;  Surgeon: Otis Brace, MD;  Location: [facility];  Service: Gastroenterology;  Laterality: N/A;  . GIVENS CAPSULE STUDY N/A 06/25/2018   Procedure: GIVENS CAPSULE STUDY;  Surgeon: Carol Ada, MD;  Location: [facility];  Service: Endoscopy;  Laterality: N/A;  . HOT HEMOSTASIS N/A 06/06/2018   Procedure: HOT HEMOSTASIS (ARGON PLASMA COAGULATION/BICAP);  Surgeon: Otis Brace, MD;  Location: [facility];  Service: Gastroenterology;  Laterality: N/A;  . HOT HEMOSTASIS N/A 06/28/2018   Procedure: HOT HEMOSTASIS (ARGON PLASMA COAGULATION/BICAP);  Surgeon: Carol Ada, MD;  Location: [facility];  Service: Endoscopy;  Laterality: N/A;  . TOTAL KNEE ARTHROPLASTY       reports that she quit smoking about 4 years ago. Her smoking use included cigarettes. She has a 9.00 pack-year smoking history. She has never used smokeless tobacco. She reports that she does not drink alcohol or use drugs.  Allergies  Allergen Reactions  . Penicillins Hives and Shortness Of Breath    Tolerates Ceftin, Ceftriaxone, and Cefepime Has patient had a PCN reaction causing immediate rash, facial/tongue/throat swelling, SOB or lightheadedness with hypotension: Y Has patient had a PCN reaction causing severe rash involving mucus membranes or skin necrosis: Y Has patient had a PCN reaction that required hospitalization:  Y Has patient had a PCN reaction occurring within the last 10 years: N     Family History  Problem Relation Age of Onset  . Hypertension Father   . Heart disease Father   . Gout Father   . Arthritis Father   . Heart attack Father   . Heart attack Brother   . Hypertension Brother   . Hypertension Sister   . Stroke Neg Hx      Prior to Admission  medications   Medication Sig Start Date End Date Taking? Authorizing Provider  albuterol (PROVENTIL HFA;VENTOLIN HFA) 108 (90 Base) MCG/ACT inhaler Inhale 1-2 puffs into the lungs every 4 (four) hours as needed for wheezing or shortness of breath. 12/06/17  Yes Mariel Aloe, MD  albuterol (PROVENTIL) (2.5 MG/3ML) 0.083% nebulizer solution Take 2.5 mg by nebulization every 6 (six) hours as needed for wheezing or shortness of breath.   Yes [provider]  allopurinol (ZYLOPRIM) 100 MG tablet Take 100 mg by mouth daily.   Yes [provider]  apixaban (ELIQUIS) 5 MG TABS tablet Take 1 tablet (5 mg total) by mouth 2 (two) times daily. 09/17/18  Yes Mariel Aloe, MD  cyclobenzaprine (FLEXERIL) 10 MG tablet Take 10 mg by mouth 2 (two) times daily as needed for muscle spasms.    Yes [provider]  famotidine (PEPCID) 20 MG tablet Take 20 mg by mouth at bedtime.  02/04/18  Yes [provider]  furosemide (LASIX) 80 MG tablet Take 0.5 tablets (40 mg total) by mouth 2 (two) times daily. Patient taking differently: Take 120 mg by mouth 2 (two) times daily.  09/18/18  Yes Mariel Aloe, MD  irbesartan (AVAPRO) 75 MG tablet Take 75 mg by mouth daily. 10/19/18  Yes [provider]  isosorbide-hydrALAZINE (BIDIL) 20-37.5 MG tablet Take 1 tablet by mouth 3 (three) times daily. 06/10/18  Yes Eugenie Filler, MD  LANTUS SOLOSTAR 100 UNIT/ML Solostar Pen  10/18/18  Yes [provider]  metoprolol tartrate (LOPRESSOR) 50 MG tablet Take 1 tablet (50 mg total) by mouth 2 (two) times daily. Patient taking differently: Take 75 mg by mouth 2 (two) times daily.  06/29/18  Yes Lavina Hamman, MD  pantoprazole (PROTONIX) 40 MG tablet Take 1 tablet (40 mg total) by mouth daily. 06/07/18 06/07/19 Yes Eugenie Filler, MD  pravastatin (PRAVACHOL) 80 MG tablet Take 80 mg by mouth daily.   Yes [provider]  SYMBICORT 80-4.5 MCG/ACT inhaler Inhale 2 puffs into  the lungs 2 (two) times daily. 02/02/18  Yes [provider]  docusate sodium (COLACE) 100 MG capsule Take 100 mg by mouth 2 (two) times daily as needed for mild constipation.    [provider]    Physical Exam: Vitals:   12/19/18 1832  BP: (!) 117/52  Pulse: 71  Resp: 18  Temp: (!) 97.4 F (36.3 C)  TempSrc: Oral  SpO2: 94%    Constitutional: NAD, calm, comfortable Eyes: PERRL, lids and conjunctivae normal ENMT: Mucous membranes are moist. Posterior pharynx clear of any exudate or lesions.Normal dentition.  Neck: normal, supple, no masses, no thyromegaly Respiratory: clear to auscultation bilaterally, no wheezing, no crackles. Normal respiratory effort. No accessory muscle use.  Cardiovascular: Regular rate and rhythm, no murmurs / rubs / gallops. No extremity edema. 2+ pedal pulses. No carotid bruits.  Abdomen: no tenderness, no masses palpated. No hepatosplenomegaly. Bowel sounds positive.  Musculoskeletal: no clubbing / cyanosis. No joint deformity upper and  lower extremities. Good ROM, no contractures. Normal muscle tone.  Skin: no rashes, lesions, ulcers. No induration Neurologic: CN 2-12 grossly intact. Sensation intact, DTR normal. Strength 5/5 in all 4.  Psychiatric: Normal judgment and insight. Alert and oriented x 3. Normal mood.    Labs on Admission: I have personally reviewed following labs and imaging studies  CBC: Recent Labs  Lab 12/19/18 1852  WBC 7.1  HGB 5.2*  HCT 19.0*  MCV 68.8*  PLT 381   Basic Metabolic Panel: Recent Labs  Lab 12/19/18 1852  NA 135  K 3.5  CL 99  CO2 25  GLUCOSE 139*  BUN 66*  CREATININE 3.41*  CALCIUM 9.3   GFR: CrCl cannot be calculated (Unknown ideal weight.). Liver Function Tests: Recent Labs  Lab 12/19/18 1852  AST 20  ALT 18  ALKPHOS 112  BILITOT 1.8*  PROT 7.4  ALBUMIN 3.5   No results for input(s): LIPASE, AMYLASE in the last 168 hours. No results for input(s): AMMONIA in the last 168  hours. Coagulation Profile: No results for input(s): INR, PROTIME in the last 168 hours. Cardiac Enzymes: No results for input(s): CKTOTAL, CKMB, CKMBINDEX, TROPONINI in the last 168 hours. BNP (last 3 results) No results for input(s): PROBNP in the last 8760 hours. HbA1C: No results for input(s): HGBA1C in the last 72 hours. CBG: No results for input(s): GLUCAP in the last 168 hours. Lipid Profile: No results for input(s): CHOL, HDL, LDLCALC, TRIG, CHOLHDL, LDLDIRECT in the last 72 hours. Thyroid Function Tests: No results for input(s): TSH, T4TOTAL, FREET4, T3FREE, THYROIDAB in the last 72 hours. Anemia Panel: No results for input(s): VITAMINB12, FOLATE, FERRITIN, TIBC, IRON, RETICCTPCT in the last 72 hours. Urine analysis:    Component Value Date/Time   COLORURINE STRAW (A) 09/11/2018 2023   APPEARANCEUR CLEAR 09/11/2018 2023   LABSPEC 1.005 09/11/2018 2023   PHURINE 7.0 09/11/2018 2023   GLUCOSEU NEGATIVE 09/11/2018 2023   HGBUR MODERATE (A) 09/11/2018 2023   BILIRUBINUR NEGATIVE 09/11/2018 2023   KETONESUR NEGATIVE 09/11/2018 2023   PROTEINUR 30 (A) 09/11/2018 2023   UROBILINOGEN 1.0 08/15/2015 0915   NITRITE NEGATIVE 09/11/2018 2023   LEUKOCYTESUR NEGATIVE 09/11/2018 2023    Radiological Exams on Admission: Dg Chest 2 View  Result Date: 12/19/2018 CLINICAL DATA:  Shortness of breath. Severe anemia. EXAM: CHEST - 2 VIEW COMPARISON:  06/22/2018 FINDINGS: Stable moderate cardiomegaly. Aortic atherosclerosis. Pulmonary vascular congestion noted, however there is no evidence of pulmonary infiltrate or edema. No evidence of pleural effusion. IMPRESSION: Cardiomegaly and pulmonary vascular congestion. No active lung disease. Electronically Signed   By: Earle Gell M.D.   On: 12/19/2018 20:54    EKG: Independently reviewed.  Assessment/Plan Principal Problem:   Anemia due to GI blood loss Active Problems:   Essential hypertension   Diabetes mellitus type 2, controlled  (HCC)   Chronic diastolic CHF (congestive heart failure) (HCC)   Atrial fibrillation, chronic   Acute on chronic renal failure (HCC)   Symptomatic anemia    1. Anemia due to GI blood loss - probably bleeding AVMs again 1. Stop Eliquis 2. Transfuse 2u PRBC 3. Repeat CBC in AM 4. Call GI in AM 5. Clear liquid diet for now 6. Continue home PPI 2. AKI on CKD - 1. Likely due to anemia 2. Hold lasix, ARB 3. IVF: 2u PRBC transfusion 4. Repeat BMP in AM 5. Likely warrants nephrology evaluation in AM. 6. Re-evaluate at that time if she needs lasix following transfusions 3.  HTN - 1. Holding Bidil, ARB 2. Continue metoprolol 4. A.Fib - 1. Continue metoprolol 2. Holding eliquis 5. DM2 - 1. Lantus 5 daily (Takes 10 at home) 2. Sensitive scale SSI Q4H  DVT prophylaxis: SCDs Code Status: Full Family Communication: Family at bedside Disposition Plan: Home after admit Consults called: None Admission status: Admit to inpatient  Severity of Illness: The appropriate patient status for this patient is INPATIENT. Inpatient status is judged to be reasonable and necessary in order to provide the required intensity of service to ensure the patient's safety. The patient's presenting symptoms, physical exam findings, and initial radiographic and laboratory data in the context of their chronic comorbidities is felt to place them at high risk for further clinical deterioration. Furthermore, it is not anticipated that the patient will be medically stable for discharge from the hospital within 2 midnights of admission. The following factors support the patient status of inpatient.   " The patient's presenting symptoms include SOB, DOE. " The initial radiographic and laboratory data are worrisome because of HGB 5.2, creat now up to 3.4 from 1.6 in July last year. " The chronic co-morbidities include CKD stage 3-4, A.Flutter on Eliquis, HTN, GI AVMs.   * I certify that at the point of admission it is my  clinical judgment that the patient will require inpatient hospital care spanning beyond 2 midnights from the point of admission due to high intensity of service, high risk for further deterioration and high frequency of surveillance required.Etta Quill DO [facility] Pager [phone] Only works nights!  If 7AM-7PM, please contact the primary day team physician taking care of patient  [email or web address] Password [email or web address]  12/19/2018, 9:06 PM

## 2018-12-19 NOTE — ED Provider Notes (Signed)
[facility] DEPT Provider Note   CSN: [id number] Arrival date & time: 12/19/18  1823     History   Chief Complaint Chief Complaint  Patient presents with  . Abnormal Lab    HPI Kathleen [name] is a 73 y.o. female.  The history is provided by the patient and medical records. No language interpreter was used.  Shortness of Breath  Severity:  Moderate Onset quality:  Gradual Duration:  1 month Timing:  Constant Progression:  Worsening Chronicity:  Recurrent Relieved by:  Rest Worsened by:  Exertion Ineffective treatments:  None tried Associated symptoms: no abdominal pain, no chest pain, no cough, no diaphoresis, no fever, no headaches, no hemoptysis, no neck pain, no rash, no sputum production, no vomiting and no wheezing     Past Medical History:  Diagnosis Date  . Arthritis   . Blood transfusion    no side affects  . CHF (congestive heart failure) ([place])   . Coronary artery disease    Cypher stent to the RCA in 2003  . Diabetes mellitus    Borderline  . GERD (gastroesophageal reflux disease)   . Hypertension   . Shortness of breath   . Sleep apnea     Patient Active Problem List   Diagnosis Date Noted  . GIB (gastrointestinal bleeding) 09/12/2018  . GI bleed 09/11/2018  . Malnutrition of moderate degree 06/26/2018  . Pressure injury of skin 06/26/2018  . Hypotension due to hypovolemia 06/22/2018  . Acute metabolic encephalopathy 50/08/3817  . Acute on chronic renal failure ([place]) 06/22/2018  . Transaminitis 06/22/2018  . Anemia due to GI blood loss 06/22/2018  . Epigastric pain 06/22/2018  . Decreased oral intake 06/22/2018  . Hypovolemic shock ([place]) 06/22/2018  . Duodenitis 06/07/2018  . Chronic gastritis 06/07/2018  . Abnormal LFTs   . Upper GI bleed   . Melena 06/04/2018  . Atrial fibrillation, chronic 06/04/2018  . SOB (shortness of breath) 03/08/2018  . SIRS (systemic inflammatory response syndrome) ([place])  12/04/2017  . Hypothermia 12/04/2017  . Elevated LFTs 12/04/2017  . Sepsis ([place]) 12/04/2017  . Atrial flutter ([place]) 03/17/2015  . NSTEMI (non-ST elevated myocardial infarction) ([place])   . Chronic diastolic CHF (congestive heart failure) ([place])   . Type 2 diabetes mellitus with hypoglycemia without coma ([place])   . Paroxysmal atrial fibrillation ([place]) 01/25/2015  . Non-ST elevation MI (NSTEMI) ([place]) 01/23/2015  . Diabetes mellitus type 2, controlled ([place]) 01/23/2015  . HLD (hyperlipidemia)   . CKD (chronic kidney disease) stage 4, GFR 15-29 ml/min ([facility])   . Acute congestive heart failure ([place]) 09/17/2014  . Asthma 03/26/2014  . GERD (gastroesophageal reflux disease) 03/26/2014  . Smoker 03/26/2014  . Postmenopausal bleeding 01/10/2014  . Carotid stenosis 01/04/2012  . Carpal tunnel syndrome on left 11/26/2011  . Transient ischemic attack on medication 11/02/2011  . Diabetes mellitus ([place]) 11/02/2011  . Hyperlipidemia with target LDL less than 70 09/17/2008  . OBESITY 09/17/2008  . Essential hypertension 09/17/2008  . Coronary atherosclerosis 09/17/2008    Past Surgical History:  Procedure Laterality Date  . BIOPSY  06/06/2018   Procedure: BIOPSY;  Surgeon: Otis Brace, MD;  Location: [facility] ENDOSCOPY;  Service: Gastroenterology;;  . CARDIOVERSION N/A 03/17/2015   Procedure: CARDIOVERSION;  Surgeon: Sueanne Margarita, MD;  Location: [facility]OS[facility];  Service: Cardiovascular;  Laterality: N/A;  . CHOLECYSTECTOMY    . CORONARY ANGIOPLASTY WITH STENT PLACEMENT    . ENTEROSCOPY N/A 06/28/2018   Procedure: ENTEROSCOPY;  Surgeon:  Carol Ada, MD;  Location: [facility];  Service: Endoscopy;  Laterality: N/A;  . ENTEROSCOPY N/A 09/12/2018   Procedure: ENTEROSCOPY;  Surgeon: Carol Ada, MD;  Location: [facility];  Service: Endoscopy;  Laterality: N/A;  . ESOPHAGOGASTRODUODENOSCOPY (EGD) WITH PROPOFOL N/A 06/06/2018   Procedure: ESOPHAGOGASTRODUODENOSCOPY (EGD) WITH PROPOFOL;  Surgeon:  Otis Brace, MD;  Location: [facility];  Service: Gastroenterology;  Laterality: N/A;  . GIVENS CAPSULE STUDY N/A 06/25/2018   Procedure: GIVENS CAPSULE STUDY;  Surgeon: Carol Ada, MD;  Location: [facility];  Service: Endoscopy;  Laterality: N/A;  . HOT HEMOSTASIS N/A 06/06/2018   Procedure: HOT HEMOSTASIS (ARGON PLASMA COAGULATION/BICAP);  Surgeon: Otis Brace, MD;  Location: [facility];  Service: Gastroenterology;  Laterality: N/A;  . HOT HEMOSTASIS N/A 06/28/2018   Procedure: HOT HEMOSTASIS (ARGON PLASMA COAGULATION/BICAP);  Surgeon: Carol Ada, MD;  Location: [facility];  Service: Endoscopy;  Laterality: N/A;  . TOTAL KNEE ARTHROPLASTY       OB History    Gravida  9   Para  7   Term  5   Preterm  2   AB  2   Living  5     SAB  2   TAB  0   Ectopic  0   Multiple  0   Live Births  7            Home Medications    Prior to Admission medications   Medication Sig Start Date End Date Taking? Authorizing Provider  albuterol (PROVENTIL HFA;VENTOLIN HFA) 108 (90 Base) MCG/ACT inhaler Inhale 1-2 puffs into the lungs every 4 (four) hours as needed for wheezing or shortness of breath. 12/06/17   Mariel Aloe, MD  albuterol (PROVENTIL) (2.5 MG/3ML) 0.083% nebulizer solution Take 2.5 mg by nebulization every 6 (six) hours as needed for wheezing or shortness of breath.    [provider]  allopurinol (ZYLOPRIM) 100 MG tablet Take 100 mg by mouth daily.    [provider]  apixaban (ELIQUIS) 5 MG TABS tablet Take 1 tablet (5 mg total) by mouth 2 (two) times daily. 09/17/18   Mariel Aloe, MD  cyclobenzaprine (FLEXERIL) 10 MG tablet Take 10 mg by mouth 3 (three) times daily as needed for muscle spasms.    [provider]  docusate sodium (COLACE) 100 MG capsule Take 100 mg by mouth 2 (two) times daily as needed for mild constipation.    [provider]  famotidine (PEPCID) 20 MG tablet Take 20 mg by mouth at  bedtime.  02/04/18   [provider]  furosemide (LASIX) 80 MG tablet Take 0.5 tablets (40 mg total) by mouth 2 (two) times daily. 09/18/18   Mariel Aloe, MD  guaiFENesin (MUCINEX) 600 MG 12 hr tablet Take 600 mg by mouth 2 (two) times daily as needed (congestion).    [provider]  isosorbide-hydrALAZINE (BIDIL) 20-37.5 MG tablet Take 1 tablet by mouth 3 (three) times daily. 06/10/18   Eugenie Filler, MD  metoprolol tartrate (LOPRESSOR) 50 MG tablet Take 1 tablet (50 mg total) by mouth 2 (two) times daily. 06/29/18   Lavina Hamman, MD  pantoprazole (PROTONIX) 40 MG tablet Take 1 tablet (40 mg total) by mouth daily. 06/07/18 06/07/19  Eugenie Filler, MD  pravastatin (PRAVACHOL) 80 MG tablet Take 80 mg by mouth daily.    [provider]  SYMBICORT 80-4.5 MCG/ACT inhaler Inhale 2 puffs into the lungs 2 (two) times daily. 02/02/18   [provider]    Family History Family History  Problem Relation Age of Onset  . Hypertension Father   . Heart disease Father   . Gout Father   . Arthritis Father   . Heart attack Father   . Heart attack Brother   . Hypertension Brother   . Hypertension Sister   . Stroke Neg Hx     Social History Social History   Tobacco Use  . Smoking status: Former Smoker    Packs/day: 0.30    Years: 30.00    Pack years: 9.00    Types: Cigarettes    Last attempt to quit: 01/18/2014    Years since quitting: 4.9  . Smokeless tobacco: Never Used  . Tobacco comment: Smoking cessation requested   Substance Use Topics  . Alcohol use: No  . Drug use: No     Allergies   Penicillins   Review of Systems Review of Systems  Constitutional: Positive for fatigue. Negative for chills, diaphoresis and fever.  HENT: Negative for congestion and rhinorrhea.   Respiratory: Positive for shortness of breath. Negative for cough, hemoptysis, sputum production, chest tightness, wheezing and stridor.   Cardiovascular: Negative for  chest pain, palpitations and leg swelling.  Gastrointestinal: Negative for abdominal distention, abdominal pain, constipation, diarrhea, nausea and vomiting.  Genitourinary: Negative for flank pain.  Musculoskeletal: Negative for back pain, neck pain and neck stiffness.  Skin: Negative for rash and wound.  Neurological: Negative for dizziness, syncope, weakness and headaches.  Psychiatric/Behavioral: Negative for agitation.  All other systems reviewed and are negative.    Physical Exam Updated Vital Signs BP (!) 117/52 (BP Location: Left Arm)   Pulse 71   Temp (!) 97.4 F (36.3 C) (Oral)   Resp 18   SpO2 94%   Physical Exam Vitals signs and nursing note reviewed.  Constitutional:      General: She is not in acute distress.    Appearance: She is well-developed. She is not ill-appearing, toxic-appearing or diaphoretic.  HENT:     Head: Normocephalic and atraumatic.     Nose: No congestion or rhinorrhea.     Mouth/Throat:     Pharynx: No oropharyngeal exudate or posterior oropharyngeal erythema.  Eyes:     Conjunctiva/sclera: Conjunctivae normal.  Neck:     Musculoskeletal: Neck supple. No muscular tenderness.  Cardiovascular:     Rate and Rhythm: Normal rate and regular rhythm.     Pulses: Normal pulses.     Heart sounds: No murmur.  Pulmonary:     Effort: Pulmonary effort is normal. No respiratory distress.     Breath sounds: Normal breath sounds. No wheezing, rhonchi or rales.  Chest:     Chest wall: No tenderness.  Abdominal:     General: There is no distension.     Palpations: Abdomen is soft.     Tenderness: There is no abdominal tenderness.  Genitourinary:    Rectum: Normal. Guaiac result positive.  Musculoskeletal:        General: No tenderness.     Right lower leg: Edema present.     Left lower leg: Edema present.  Skin:    General: Skin is warm and dry.     Capillary Refill: Capillary refill takes less than 2 seconds.     Coloration: Skin is not  jaundiced.  Neurological:     General: No focal deficit present.     Mental Status: She is alert and oriented to person, place, and time.  Sensory: No sensory deficit.     Motor: No weakness.  Psychiatric:        Mood and Affect: Mood normal.      ED Treatments / Results  Labs (all labs ordered are listed, but only abnormal results are displayed) Labs Reviewed  COMPREHENSIVE METABOLIC PANEL - Abnormal; Notable for the following components:      Result Value   Glucose, Bld 139 (*)    BUN 66 (*)    Creatinine, Ser 3.41 (*)    Total Bilirubin 1.8 (*)    GFR calc non Af Amer 13 (*)    GFR calc Af Amer 15 (*)    All other components within normal limits  CBC - Abnormal; Notable for the following components:   RBC 2.76 (*)    Hemoglobin 5.2 (*)    HCT 19.0 (*)    MCV 68.8 (*)    MCH 18.8 (*)    MCHC 27.4 (*)    RDW 23.9 (*)    nRBC 1.6 (*)    All other components within normal limits  BRAIN NATRIURETIC PEPTIDE - Abnormal; Notable for the following components:   B Natriuretic Peptide 863.6 (*)    All other components within normal limits  GLUCOSE, CAPILLARY - Abnormal; Notable for the following components:   Glucose-Capillary 141 (*)    All other components within normal limits  POC OCCULT BLOOD, ED - Abnormal; Notable for the following components:   Fecal Occult Bld POSITIVE (*)    All other components within normal limits  CBC  BASIC METABOLIC PANEL  I-STAT TROPONIN, ED  I-STAT TROPONIN, ED  TYPE AND SCREEN  PREPARE RBC (CROSSMATCH)    EKG None  Radiology Dg Chest 2 View  Result Date: 12/19/2018 CLINICAL DATA:  Shortness of breath. Severe anemia. EXAM: CHEST - 2 VIEW COMPARISON:  06/22/2018 FINDINGS: Stable moderate cardiomegaly. Aortic atherosclerosis. Pulmonary vascular congestion noted, however there is no evidence of pulmonary infiltrate or edema. No evidence of pleural effusion. IMPRESSION: Cardiomegaly and pulmonary vascular congestion. No active lung  disease. Electronically Signed   By: Earle Gell M.D.   On: 12/19/2018 20:54    Procedures Procedures (including critical care time)  CRITICAL CARE Performed by: Gwenyth Allegra Tegeler Total critical care time: 35 minutes Critical care time was exclusive of separately billable procedures and treating other patients. Critical care was necessary to treat or prevent imminent or life-threatening deterioration. Critical care was time spent personally by me on the following activities: development of treatment plan with patient and/or surrogate as well as nursing, discussions with consultants, evaluation of patient's response to treatment, examination of patient, obtaining history from patient or surrogate, ordering and performing treatments and interventions, ordering and review of laboratory studies, ordering and review of radiographic studies, pulse oximetry and re-evaluation of patient's condition.   Medications Ordered in ED Medications - No data to display   Initial Impression / Assessment and Plan / ED Course  I have reviewed the triage vital signs and the nursing notes.  Pertinent labs & imaging results that were available during my care of the patient were reviewed by me and considered in my medical decision making (see chart for details).     Kathleen [name] is a 73 y.o. female with a past medical history significant for CAD, CHF, diabetes, atrial fibrillation on Eliquis therapy, prior GI bleed, CKD, hypertension, hyperlipidemia, and diabetes who presents with fatigue and exertional shortness of breath.  Patient reports that she saw her PCP  yesterday for continued fatigue and exertional shortness of breath and had lab work done.  Patient was called today when it was found that her hemoglobin was in the fives.  Patient says that she had fatigue and some shortness of breath when she was having symptomatic anemia several months ago and had to be admitted for transfusions.  She reports that  she has had no vomiting, no bright blood per rectum, no dark stools.  She reports that she is having no chest pain, palpitations, nausea, vomiting, or syncopal episodes.  She denies any urinary symptoms.  She does report that she has felt very tired and occasionally has shortness of breath when she is ambulating.  On exam, lungs clear.  Chest is nontender.  Abdomen is nontender.  No murmur appreciated.  Fecal occult was positive with no gross blood.  Chaperone was present for rectal exam.  Legs had some mild edema.  No leg tenderness or arm tenderness.  Symmetric radial and pedal pulses.  Laboratory testing confirmed a low hemoglobin at 5.2.  This is less than the 8.0 it was last checked several months ago.  Creatinine is also elevated with acute kidney injury with creatinine of 3.41.  Patient will have other work-up including a troponin, BNP with the edema and a chest x-ray with the shortness of breath however I suspect symptomatic anemia as a cause of symptoms.  Patient was given 2 units of blood and will be admitted for further management of symptomatic anemia.  With no gross blood, do not feel patient needs emergent GI evaluation and I suspect this is some AVM bleeding in the setting of her Eliquis use, will defer to hospitalist service for further management.  Patient will be admitted.   Final Clinical Impressions(s) / ED Diagnoses   Final diagnoses:  Symptomatic anemia  Occult gastrointestinal hemorrhage  AKI (acute kidney injury) [facility])    ED Discharge Orders    None      Clinical Impression: 1. Symptomatic anemia   2. Occult gastrointestinal hemorrhage   3. AKI (acute kidney injury) ([place])     Disposition: Admit  This note was prepared with assistance of Dragon voice recognition software. Occasional wrong-word or sound-a-like substitutions may have occurred due to the inherent limitations of voice recognition software.     Tegeler, Gwenyth Allegra, MD 12/19/18 [phone]

## 2018-12-20 DIAGNOSIS — N182 Chronic kidney disease, stage 2 (mild): Secondary | ICD-10-CM

## 2018-12-20 DIAGNOSIS — E1122 Type 2 diabetes mellitus with diabetic chronic kidney disease: Secondary | ICD-10-CM

## 2018-12-20 LAB — BASIC METABOLIC PANEL
Anion gap: 12 (ref 5–15)
BUN: 67 mg/dL — ABNORMAL HIGH (ref 8–23)
CHLORIDE: 100 mmol/L (ref 98–111)
CO2: 25 mmol/L (ref 22–32)
Calcium: 9.4 mg/dL (ref 8.9–10.3)
Creatinine, Ser: 3.31 mg/dL — ABNORMAL HIGH (ref 0.44–1.00)
GFR calc Af Amer: 15 mL/min — ABNORMAL LOW (ref >60)
GFR calc non Af Amer: 13 mL/min — ABNORMAL LOW (ref >60)
Glucose, Bld: 88 mg/dL (ref 70–99)
Potassium: 3.7 mmol/L (ref 3.5–5.1)
Sodium: 137 mmol/L (ref 135–145)

## 2018-12-20 LAB — CBC
HCT: 24.4 % — ABNORMAL LOW (ref 36.0–46.0)
Hemoglobin: 7.2 g/dL — ABNORMAL LOW (ref 12.0–15.0)
MCH: 22.4 pg — ABNORMAL LOW (ref 26.0–34.0)
MCHC: 29.5 g/dL — ABNORMAL LOW (ref 30.0–36.0)
MCV: 75.8 fL — ABNORMAL LOW (ref 80.0–100.0)
Platelets: 143 10*3/uL — ABNORMAL LOW (ref 150–400)
RBC: 3.22 MIL/uL — AB (ref 3.87–5.11)
RDW: 27.9 % — ABNORMAL HIGH (ref 11.5–15.5)
WBC: 6.2 10*3/uL (ref 4.0–10.5)
nRBC: 2.1 % — ABNORMAL HIGH (ref 0.0–0.2)

## 2018-12-20 LAB — GLUCOSE, CAPILLARY
Glucose-Capillary: 94 mg/dL (ref 70–99)
Glucose-Capillary: 85 mg/dL (ref 70–99)
Glucose-Capillary: 90 mg/dL (ref 70–99)
Glucose-Capillary: 72 mg/dL (ref 70–99)

## 2018-12-20 MED ORDER — SODIUM CHLORIDE 0.45 % IV SOLN
INTRAVENOUS | Status: DC
  Administered 2018-12-20 – 2018-12-22 (×3): via INTRAVENOUS

## 2018-12-20 MED ORDER — SODIUM CHLORIDE 0.9 % IV SOLN: INTRAVENOUS | Status: DC

## 2018-12-20 NOTE — H&P (View-Only) (Signed)
Reason for Consult: Anemia Referring Physician: [facility]  [name] HPI: This is a 73 year old female with a known history of bleeding proximal small bowel AVMs.  She was evaluated with an enteroscopy in 06/2018 and 09/2018 and an EGD on 05/2018.  During both procedures she was found to have actively bleeding AVMs in the second and third portions of the duodenum.  She complained about SOB and DOE and her HGB was noted to be at 5.2 g/dL, which is a drop from her last level of 8-9 g/dL.  She does use Eliquis for her atrial fibrillation.  Her stools are always dark, but she feels that they were darker of late.  Per her recollection, she thinks that she has been feel progressively worse for the past month.  Past Medical History:  Diagnosis Date  . Arthritis   . Blood transfusion    no side affects  . CHF (congestive heart failure) ([place])   . Coronary artery disease    Cypher stent to the RCA in 2003  . Diabetes mellitus    Borderline  . GERD (gastroesophageal reflux disease)   . Hypertension   . Shortness of breath   . Sleep apnea     Past Surgical History:  Procedure Laterality Date  . BIOPSY  06/06/2018   Procedure: BIOPSY;  Surgeon: Otis Brace, MD;  [facility];  Service: Gastroenterology;;  . CARDIOVERSION N/A 03/17/2015   Procedure: CARDIOVERSION;  Surgeon: Sueanne Margarita, MD;  [facility];  Service: Cardiovascular;  Laterality: N/A;  . CHOLECYSTECTOMY    . CORONARY ANGIOPLASTY WITH STENT PLACEMENT    . ENTEROSCOPY N/A 06/28/2018   Procedure: ENTEROSCOPY;  Surgeon: Carol Ada, MD;  [facility];  Service: Endoscopy;  Laterality: N/A;  . ENTEROSCOPY N/A 09/12/2018   Procedure: ENTEROSCOPY;  Surgeon: Carol Ada, MD;  [facility];  Service: Endoscopy;  Laterality: N/A;  . ESOPHAGOGASTRODUODENOSCOPY (EGD) WITH PROPOFOL N/A 06/06/2018   Procedure: ESOPHAGOGASTRODUODENOSCOPY (EGD) WITH PROPOFOL;  Surgeon: Otis Brace,  MD;  [facility];  Service: Gastroenterology;  Laterality: N/A;  . GIVENS CAPSULE STUDY N/A 06/25/2018   Procedure: GIVENS CAPSULE STUDY;  Surgeon: Carol Ada, MD;  [facility];  Service: Endoscopy;  Laterality: N/A;  . HOT HEMOSTASIS N/A 06/06/2018   Procedure: HOT HEMOSTASIS (ARGON PLASMA COAGULATION/BICAP);  Surgeon: Otis Brace, MD;  Location: [facility];  Service: Gastroenterology;  Laterality: N/A;  . HOT HEMOSTASIS N/A 06/28/2018   Procedure: HOT HEMOSTASIS (ARGON PLASMA COAGULATION/BICAP);  Surgeon: Carol Ada, MD;  Location: [facility];  Service: Endoscopy;  Laterality: N/A;  . TOTAL KNEE ARTHROPLASTY      Family History  Problem Relation Age of Onset  . Hypertension Father   . Heart disease Father   . Gout Father   . Arthritis Father   . Heart attack Father   . Heart attack Brother   . Hypertension Brother   . Hypertension Sister   . Stroke Neg Hx     Social History:  reports that she quit smoking about 4 years ago. Her smoking use included cigarettes. She has a 9.00 pack-year smoking history. She has never used smokeless tobacco. She reports that she does not drink alcohol or use drugs.  Allergies:  Allergies  Allergen Reactions  . Penicillins Hives and Shortness Of Breath    Tolerates Ceftin, Ceftriaxone, and Cefepime Has patient had a PCN reaction causing immediate rash, facial/tongue/throat swelling, SOB or lightheadedness with hypotension: Y Has patient had a  PCN reaction causing severe rash involving mucus membranes or skin necrosis: Y Has patient had a PCN reaction that required hospitalization: Y Has patient had a PCN reaction occurring within the last 10 years: N     Medications:  Scheduled: . allopurinol  100 mg Oral Daily  . famotidine  20 mg Oral QHS  . insulin aspart  0-9 Units Subcutaneous Q4H  . insulin glargine  5 Units Subcutaneous Daily  . metoprolol tartrate  75 mg Oral BID  . mometasone-formoterol  2 puff  Inhalation BID  . pantoprazole  40 mg Oral Daily  . pravastatin  80 mg Oral Daily   Continuous: . sodium chloride      Results for orders placed or performed during the hospital encounter of 12/19/18 (from the past 24 hour(s))  Comprehensive metabolic panel     Status: Abnormal   Collection Time: 12/19/18  6:52 PM  Result Value Ref Range   Sodium 135 135 - 145 mmol/L   Potassium 3.5 3.5 - 5.1 mmol/L   Chloride 99 98 - 111 mmol/L   CO2 25 22 - 32 mmol/L   Glucose, Bld 139 (H) 70 - 99 mg/dL   BUN 66 (H) 8 - 23 mg/dL   Creatinine, Ser 3.41 (H) 0.44 - 1.00 mg/dL   Calcium 9.3 8.9 - 10.3 mg/dL   Total Protein 7.4 6.5 - 8.1 g/dL   Albumin 3.5 3.5 - 5.0 g/dL   AST 20 15 - 41 U/L   ALT 18 0 - 44 U/L   Alkaline Phosphatase 112 38 - 126 U/L   Total Bilirubin 1.8 (H) 0.3 - 1.2 mg/dL   GFR calc non Af Amer 13 (L) >60 mL/min   GFR calc Af Amer 15 (L) >60 mL/min   Anion gap 11 5 - 15  CBC     Status: Abnormal   Collection Time: 12/19/18  6:52 PM  Result Value Ref Range   WBC 7.1 4.0 - 10.5 K/uL   RBC 2.76 (L) 3.87 - 5.11 MIL/uL   Hemoglobin 5.2 (LL) 12.0 - 15.0 g/dL   HCT 19.0 (L) 36.0 - 46.0 %   MCV 68.8 (L) 80.0 - 100.0 fL   MCH 18.8 (L) 26.0 - 34.0 pg   MCHC 27.4 (L) 30.0 - 36.0 g/dL   RDW 23.9 (H) 11.5 - 15.5 %   Platelets 181 150 - 400 K/uL   nRBC 1.6 (H) 0.0 - 0.2 %  Type and screen [place]     Status: None (Preliminary result)   Collection Time: 12/19/18  6:53 PM  Result Value Ref Range   ABO/RH(D) O POS    Antibody Screen NEG    Sample Expiration 12/22/2018    Unit Number [id number]    Blood Component Type RED CELLS,LR    Unit division 00    Status of Unit ISSUED    Transfusion Status OK TO TRANSFUSE    Crossmatch Result Compatible    Unit Number [id number]    Blood Component Type RBC LR PHER1    Unit division 00    Status of Unit ISSUED,FINAL    Transfusion Status OK TO TRANSFUSE    Crossmatch Result      Compatible Performed at  [facility], [place] [address] [place], [place] [address]   Prepare RBC     Status: None   Collection Time: 12/19/18  8:19 PM  Result Value Ref Range   Order Confirmation      ORDER  PROCESSED BY BLOOD BANK Performed at [facility] [address], [place], [place] [address]   Brain natriuretic peptide     Status: Abnormal   Collection Time: 12/19/18  8:20 PM  Result Value Ref Range   B Natriuretic Peptide 863.6 (H) 0.0 - 100.0 pg/mL  POC occult blood, ED Provider will collect     Status: Abnormal   Collection Time: 12/19/18  8:25 PM  Result Value Ref Range   Fecal Occult Bld POSITIVE (A) NEGATIVE  I-stat troponin, ED     Status: None   Collection Time: 12/19/18 10:00 PM  Result Value Ref Range   Troponin i, poc 0.01 0.00 - 0.08 ng/mL   Comment 3          Glucose, capillary     Status: Abnormal   Collection Time: 12/19/18 11:14 PM  Result Value Ref Range   Glucose-Capillary 141 (H) 70 - 99 mg/dL  Glucose, capillary     Status: None   Collection Time: 12/20/18  4:04 AM  Result Value Ref Range   Glucose-Capillary 94 70 - 99 mg/dL  CBC     Status: Abnormal   Collection Time: 12/20/18  6:48 AM  Result Value Ref Range   WBC 6.2 4.0 - 10.5 K/uL   RBC 3.22 (L) 3.87 - 5.11 MIL/uL   Hemoglobin 7.2 (L) 12.0 - 15.0 g/dL   HCT 24.4 (L) 36.0 - 46.0 %   MCV 75.8 (L) 80.0 - 100.0 fL   MCH 22.4 (L) 26.0 - 34.0 pg   MCHC 29.5 (L) 30.0 - 36.0 g/dL   RDW 27.9 (H) 11.5 - 15.5 %   Platelets 143 (L) 150 - 400 K/uL   nRBC 2.1 (H) 0.0 - 0.2 %  Basic metabolic panel     Status: Abnormal   Collection Time: 12/20/18  6:48 AM  Result Value Ref Range   Sodium 137 135 - 145 mmol/L   Potassium 3.7 3.5 - 5.1 mmol/L   Chloride 100 98 - 111 mmol/L   CO2 25 22 - 32 mmol/L   Glucose, Bld 88 70 - 99 mg/dL   BUN 67 (H) 8 - 23 mg/dL   Creatinine, Ser 3.31 (H) 0.44 - 1.00 mg/dL   Calcium 9.4 8.9 - 10.3 mg/dL   GFR calc non Af Amer 13 (L) >60 mL/min   GFR calc Af  Amer 15 (L) >60 mL/min   Anion gap 12 5 - 15  Glucose, capillary     Status: None   Collection Time: 12/20/18  7:57 AM  Result Value Ref Range   Glucose-Capillary 85 70 - 99 mg/dL     Dg Chest 2 View  Result Date: 12/19/2018 CLINICAL DATA:  Shortness of breath. Severe anemia. EXAM: CHEST - 2 VIEW COMPARISON:  06/22/2018 FINDINGS: Stable moderate cardiomegaly. Aortic atherosclerosis. Pulmonary vascular congestion noted, however there is no evidence of pulmonary infiltrate or edema. No evidence of pleural effusion. IMPRESSION: Cardiomegaly and pulmonary vascular congestion. No active lung disease. Electronically Signed   By: Earle Gell M.D.   On: 12/19/2018 20:54    ROS:  As stated above in the HPI otherwise negative.  Blood pressure (!) 101/57, pulse 66, temperature 97.7 F (36.5 C), temperature source Oral, resp. rate 18, height 5\' 4"  (1.626 m), weight 101.6 kg, SpO2 98 %.    PE: Gen: NAD, Alert and Oriented HEENT:  [place]/AT, EOMI Neck: Supple, no LAD Lungs: CTA Bilaterally CV: RRR without M/G/R ABM: Soft, NTND, +BS Ext:  No C/C/E  Assessment/Plan: 1) Probable recurrent bleeding small bowel AVMs. 2) Anemia. 3) Afib.   With her history of bleeding AVMs, a repeat enteroscopy will be performed.  Plan: 1) Enteroscopy tomorrow. 2) Follow HGB. 3) Transfuse as necessary.  Ahliya Glatt D 12/20/2018, 12:37 PM

## 2018-12-20 NOTE — Consult Note (Signed)
Reason for Consult: Anemia Referring Physician: [facility]  [name] HPI: This is a 73 year old female with a known history of bleeding proximal small bowel AVMs.  She was evaluated with an enteroscopy in 06/2018 and 09/2018 and an EGD on 05/2018.  During both procedures she was found to have actively bleeding AVMs in the second and third portions of the duodenum.  She complained about SOB and DOE and her HGB was noted to be at 5.2 g/dL, which is a drop from her last level of 8-9 g/dL.  She does use Eliquis for her atrial fibrillation.  Her stools are always dark, but she feels that they were darker of late.  Per her recollection, she thinks that she has been feel progressively worse for the past month.  Past Medical History:  Diagnosis Date  . Arthritis   . Blood transfusion    no side affects  . CHF (congestive heart failure) ([place])   . Coronary artery disease    Cypher stent to the RCA in 2003  . Diabetes mellitus    Borderline  . GERD (gastroesophageal reflux disease)   . Hypertension   . Shortness of breath   . Sleep apnea     Past Surgical History:  Procedure Laterality Date  . BIOPSY  06/06/2018   Procedure: BIOPSY;  Surgeon: Otis Brace, MD;  [facility];  Service: Gastroenterology;;  . CARDIOVERSION N/A 03/17/2015   Procedure: CARDIOVERSION;  Surgeon: Sueanne Margarita, MD;  [facility];  Service: Cardiovascular;  Laterality: N/A;  . CHOLECYSTECTOMY    . CORONARY ANGIOPLASTY WITH STENT PLACEMENT    . ENTEROSCOPY N/A 06/28/2018   Procedure: ENTEROSCOPY;  Surgeon: Carol Ada, MD;  [facility];  Service: Endoscopy;  Laterality: N/A;  . ENTEROSCOPY N/A 09/12/2018   Procedure: ENTEROSCOPY;  Surgeon: Carol Ada, MD;  [facility];  Service: Endoscopy;  Laterality: N/A;  . ESOPHAGOGASTRODUODENOSCOPY (EGD) WITH PROPOFOL N/A 06/06/2018   Procedure: ESOPHAGOGASTRODUODENOSCOPY (EGD) WITH PROPOFOL;  Surgeon: Otis Brace,  MD;  [facility];  Service: Gastroenterology;  Laterality: N/A;  . GIVENS CAPSULE STUDY N/A 06/25/2018   Procedure: GIVENS CAPSULE STUDY;  Surgeon: Carol Ada, MD;  [facility];  Service: Endoscopy;  Laterality: N/A;  . HOT HEMOSTASIS N/A 06/06/2018   Procedure: HOT HEMOSTASIS (ARGON PLASMA COAGULATION/BICAP);  Surgeon: Otis Brace, MD;  Location: [facility];  Service: Gastroenterology;  Laterality: N/A;  . HOT HEMOSTASIS N/A 06/28/2018   Procedure: HOT HEMOSTASIS (ARGON PLASMA COAGULATION/BICAP);  Surgeon: Carol Ada, MD;  Location: [facility];  Service: Endoscopy;  Laterality: N/A;  . TOTAL KNEE ARTHROPLASTY      Family History  Problem Relation Age of Onset  . Hypertension Father   . Heart disease Father   . Gout Father   . Arthritis Father   . Heart attack Father   . Heart attack Brother   . Hypertension Brother   . Hypertension Sister   . Stroke Neg Hx     Social History:  reports that she quit smoking about 4 years ago. Her smoking use included cigarettes. She has a 9.00 pack-year smoking history. She has never used smokeless tobacco. She reports that she does not drink alcohol or use drugs.  Allergies:  Allergies  Allergen Reactions  . Penicillins Hives and Shortness Of Breath    Tolerates Ceftin, Ceftriaxone, and Cefepime Has patient had a PCN reaction causing immediate rash, facial/tongue/throat swelling, SOB or lightheadedness with hypotension: Y Has patient had a  PCN reaction causing severe rash involving mucus membranes or skin necrosis: Y Has patient had a PCN reaction that required hospitalization: Y Has patient had a PCN reaction occurring within the last 10 years: N     Medications:  Scheduled: . allopurinol  100 mg Oral Daily  . famotidine  20 mg Oral QHS  . insulin aspart  0-9 Units Subcutaneous Q4H  . insulin glargine  5 Units Subcutaneous Daily  . metoprolol tartrate  75 mg Oral BID  . mometasone-formoterol  2 puff  Inhalation BID  . pantoprazole  40 mg Oral Daily  . pravastatin  80 mg Oral Daily   Continuous: . sodium chloride      Results for orders placed or performed during the hospital encounter of 12/19/18 (from the past 24 hour(s))  Comprehensive metabolic panel     Status: Abnormal   Collection Time: 12/19/18  6:52 PM  Result Value Ref Range   Sodium 135 135 - 145 mmol/L   Potassium 3.5 3.5 - 5.1 mmol/L   Chloride 99 98 - 111 mmol/L   CO2 25 22 - 32 mmol/L   Glucose, Bld 139 (H) 70 - 99 mg/dL   BUN 66 (H) 8 - 23 mg/dL   Creatinine, Ser 3.41 (H) 0.44 - 1.00 mg/dL   Calcium 9.3 8.9 - 10.3 mg/dL   Total Protein 7.4 6.5 - 8.1 g/dL   Albumin 3.5 3.5 - 5.0 g/dL   AST 20 15 - 41 U/L   ALT 18 0 - 44 U/L   Alkaline Phosphatase 112 38 - 126 U/L   Total Bilirubin 1.8 (H) 0.3 - 1.2 mg/dL   GFR calc non Af Amer 13 (L) >60 mL/min   GFR calc Af Amer 15 (L) >60 mL/min   Anion gap 11 5 - 15  CBC     Status: Abnormal   Collection Time: 12/19/18  6:52 PM  Result Value Ref Range   WBC 7.1 4.0 - 10.5 K/uL   RBC 2.76 (L) 3.87 - 5.11 MIL/uL   Hemoglobin 5.2 (LL) 12.0 - 15.0 g/dL   HCT 19.0 (L) 36.0 - 46.0 %   MCV 68.8 (L) 80.0 - 100.0 fL   MCH 18.8 (L) 26.0 - 34.0 pg   MCHC 27.4 (L) 30.0 - 36.0 g/dL   RDW 23.9 (H) 11.5 - 15.5 %   Platelets 181 150 - 400 K/uL   nRBC 1.6 (H) 0.0 - 0.2 %  Type and screen [place]     Status: None (Preliminary result)   Collection Time: 12/19/18  6:53 PM  Result Value Ref Range   ABO/RH(D) O POS    Antibody Screen NEG    Sample Expiration 12/22/2018    Unit Number [id number]    Blood Component Type RED CELLS,LR    Unit division 00    Status of Unit ISSUED    Transfusion Status OK TO TRANSFUSE    Crossmatch Result Compatible    Unit Number [id number]    Blood Component Type RBC LR PHER1    Unit division 00    Status of Unit ISSUED,FINAL    Transfusion Status OK TO TRANSFUSE    Crossmatch Result      Compatible Performed at  [facility], [place] [address] [place], [place] [address]   Prepare RBC     Status: None   Collection Time: 12/19/18  8:19 PM  Result Value Ref Range   Order Confirmation      ORDER  PROCESSED BY BLOOD BANK Performed at [facility] [address] [place], [place] [address]   Brain natriuretic peptide     Status: Abnormal   Collection Time: 12/19/18  8:20 PM  Result Value Ref Range   B Natriuretic Peptide 863.6 (H) 0.0 - 100.0 pg/mL  POC occult blood, ED Provider will collect     Status: Abnormal   Collection Time: 12/19/18  8:25 PM  Result Value Ref Range   Fecal Occult Bld POSITIVE (A) NEGATIVE  I-stat troponin, ED     Status: None   Collection Time: 12/19/18 10:00 PM  Result Value Ref Range   Troponin i, poc 0.01 0.00 - 0.08 ng/mL   Comment 3          Glucose, capillary     Status: Abnormal   Collection Time: 12/19/18 11:14 PM  Result Value Ref Range   Glucose-Capillary 141 (H) 70 - 99 mg/dL  Glucose, capillary     Status: None   Collection Time: 12/20/18  4:04 AM  Result Value Ref Range   Glucose-Capillary 94 70 - 99 mg/dL  CBC     Status: Abnormal   Collection Time: 12/20/18  6:48 AM  Result Value Ref Range   WBC 6.2 4.0 - 10.5 K/uL   RBC 3.22 (L) 3.87 - 5.11 MIL/uL   Hemoglobin 7.2 (L) 12.0 - 15.0 g/dL   HCT 24.4 (L) 36.0 - 46.0 %   MCV 75.8 (L) 80.0 - 100.0 fL   MCH 22.4 (L) 26.0 - 34.0 pg   MCHC 29.5 (L) 30.0 - 36.0 g/dL   RDW 27.9 (H) 11.5 - 15.5 %   Platelets 143 (L) 150 - 400 K/uL   nRBC 2.1 (H) 0.0 - 0.2 %  Basic metabolic panel     Status: Abnormal   Collection Time: 12/20/18  6:48 AM  Result Value Ref Range   Sodium 137 135 - 145 mmol/L   Potassium 3.7 3.5 - 5.1 mmol/L   Chloride 100 98 - 111 mmol/L   CO2 25 22 - 32 mmol/L   Glucose, Bld 88 70 - 99 mg/dL   BUN 67 (H) 8 - 23 mg/dL   Creatinine, Ser 3.31 (H) 0.44 - 1.00 mg/dL   Calcium 9.4 8.9 - 10.3 mg/dL   GFR calc non Af Amer 13 (L) >60 mL/min   GFR calc Af  Amer 15 (L) >60 mL/min   Anion gap 12 5 - 15  Glucose, capillary     Status: None   Collection Time: 12/20/18  7:57 AM  Result Value Ref Range   Glucose-Capillary 85 70 - 99 mg/dL     Dg Chest 2 View  Result Date: 12/19/2018 CLINICAL DATA:  Shortness of breath. Severe anemia. EXAM: CHEST - 2 VIEW COMPARISON:  06/22/2018 FINDINGS: Stable moderate cardiomegaly. Aortic atherosclerosis. Pulmonary vascular congestion noted, however there is no evidence of pulmonary infiltrate or edema. No evidence of pleural effusion. IMPRESSION: Cardiomegaly and pulmonary vascular congestion. No active lung disease. Electronically Signed   By: Earle Gell M.D.   On: 12/19/2018 20:54    ROS:  As stated above in the HPI otherwise negative.  Blood pressure (!) 101/57, pulse 66, temperature 97.7 F (36.5 C), temperature source Oral, resp. rate 18, height 5\' 4"  (1.626 m), weight 101.6 kg, SpO2 98 %.    PE: Gen: NAD, Alert and Oriented HEENT:  /AT, EOMI Neck: Supple, no LAD Lungs: CTA Bilaterally CV: RRR without M/G/R ABM: Soft, NTND, +BS Ext:  No C/C/E  Assessment/Plan: 1) Probable recurrent bleeding small bowel AVMs. 2) Anemia. 3) Afib.   With her history of bleeding AVMs, a repeat enteroscopy will be performed.  Plan: 1) Enteroscopy tomorrow. 2) Follow HGB. 3) Transfuse as necessary.  Nallely Yost D 12/20/2018, 12:37 PM

## 2018-12-20 NOTE — Progress Notes (Signed)
[facility] PROGRESS NOTE  BANI GIANFRANCESCO WRU:[id number] DOB: 10/26/1946 DOA: 12/19/2018  PCP: Nolene Ebbs, MD  Brief History/Interval Summary: 73 y.o. female with medical history significant of DM2, HTN, CKD stage 3-4, A.Flutter on Eliquis. Patient was worked up for GI bleed 3 times last year (June, July, Sept).  Found gastric and small bowel AVMs.  After cautery, eliquis was resumed.  Patient was seen by her PCP with a one-month history of fatigue.  Blood work was done and was found to have low hemoglobin.  Patient was sent to the emergency department.  Reason for Visit: Chronic GI blood loss  Consultants: Gastroenterology  Procedures: None  Antibiotics: None  Subjective/Interval History: Patient denies any bright red blood per rectum or melanotic stools recently.  Denies any abdominal pain.  No nausea vomiting.  ROS: Denies any chest pain or shortness of breath.  Objective:  Vital Signs  Vitals:   12/20/18 0212 12/20/18 0240 12/20/18 0440 12/20/18 1421  BP: (!) 102/55 (!) 113/58 (!) 101/57 126/80  Pulse: 74 77 66 66  Resp: 18 18 18 18   Temp: (!) 97.2 F (36.2 C) (!) 97.2 F (36.2 C) 97.7 F (36.5 C) 98 F (36.7 C)  TempSrc: Oral Oral Oral Oral  SpO2: 100% 100% 98% 98%  Weight:      Height:        Intake/Output Summary (Last 24 hours) at 12/20/2018 1608 Last data filed at 12/20/2018 1400 Gross per 24 hour  Intake 1018.75 ml  Output 200 ml  Net 818.75 ml   Filed Weights   12/19/18 2237  Weight: 101.6 kg    General appearance: alert, cooperative, appears stated age and no distress Resp: clear to auscultation bilaterally Cardio: regular rate and rhythm, S1, S2 normal, no murmur, click, rub or gallop GI: soft, non-tender; bowel sounds normal; no masses,  no organomegaly Extremities: extremities normal, atraumatic, no cyanosis or edema Pulses: 2+ and symmetric Neurologic: Alert and oriented x3.  No focal neurological deficits.  Lab Results:  Data  Reviewed: I have personally reviewed following labs and imaging studies  CBC: Recent Labs  Lab 12/19/18 1852 12/20/18 0648  WBC 7.1 6.2  HGB 5.2* 7.2*  HCT 19.0* 24.4*  MCV 68.8* 75.8*  PLT 181 143*    Basic Metabolic Panel: Recent Labs  Lab 12/19/18 1852 12/20/18 0648  NA 135 137  K 3.5 3.7  CL 99 100  CO2 25 25  GLUCOSE 139* 88  BUN 66* 67*  CREATININE 3.41* 3.31*  CALCIUM 9.3 9.4    GFR: Estimated Creatinine Clearance: 17.3 mL/min (A) (by C-G formula based on SCr of 3.31 mg/dL (H)).  Liver Function Tests: Recent Labs  Lab 12/19/18 1852  AST 20  ALT 18  ALKPHOS 112  BILITOT 1.8*  PROT 7.4  ALBUMIN 3.5    CBG: Recent Labs  Lab 12/19/18 2314 12/20/18 0404 12/20/18 0757 12/20/18 1237  GLUCAP 141* 94 85 90     Radiology Studies: Dg Chest 2 View  Result Date: 12/19/2018 CLINICAL DATA:  Shortness of breath. Severe anemia. EXAM: CHEST - 2 VIEW COMPARISON:  06/22/2018 FINDINGS: Stable moderate cardiomegaly. Aortic atherosclerosis. Pulmonary vascular congestion noted, however there is no evidence of pulmonary infiltrate or edema. No evidence of pleural effusion. IMPRESSION: Cardiomegaly and pulmonary vascular congestion. No active lung disease. Electronically Signed   By: Earle Gell M.D.   On: 12/19/2018 20:54     Medications:  Scheduled: . allopurinol  100 mg Oral Daily  . famotidine  20 mg Oral QHS  . insulin aspart  0-9 Units Subcutaneous Q4H  . insulin glargine  5 Units Subcutaneous Daily  . metoprolol tartrate  75 mg Oral BID  . mometasone-formoterol  2 puff Inhalation BID  . pantoprazole  40 mg Oral Daily  . pravastatin  80 mg Oral Daily   Continuous: . sodium chloride 75 mL/hr at 12/20/18 1215   RUE:[id number] **OR** acetaminophen, albuterol, cyclobenzaprine, docusate sodium, ondansetron **OR** ondansetron (ZOFRAN) IV    Assessment/Plan:  Anemia due to GI blood loss Patient presented with hemoglobin of 5.  She was transfused 2  units.  Hemoglobin greater than 7 now.  Recheck tomorrow morning.  Transfuse as needed.  GI bleed, chronic, likely secondary to AVMs Gastroenterology consulted.  Patient to undergo enteroscopy tomorrow.  Leave n.p.o. for now.  PPI.  Acute kidney injury on chronic kidney disease 3 Baseline creatinine around 2.5 noted to be significantly elevated at 3.41 on admission with a BUN of 66.  This is most likely due to GI bleeding.  Gently hydrate.  Recheck labs tomorrow.  Monitor urine output.  Electrolytes are okay.  Essential hypertension Holding BiDil and ARB.  Monitor blood pressures.  Continue metoprolol.  History of atrial fibrillation Continue metoprolol.  Holding Eliquis due to bleeding issues.  Diabetes mellitus type 2 with chronic kidney disease On low-dose Lantus.  CBGs borderline low.  We will discontinue Lantus for now.  Continue just SSI.  DVT Prophylaxis: SCDs    Code Status: Full code Family Communication: Discussed with the patient Disposition Plan: Management as outlined above.  GI to see.    LOS: 1 day   [place] Pager [phone] 12/20/2018, 4:08 PM  If 7PM-7AM, please contact night-coverage at [email or web address], password [facility]

## 2018-12-21 ENCOUNTER — Inpatient Hospital Stay (HOSPITAL_COMMUNITY): Payer: Medicare Other | Admitting: Anesthesiology

## 2018-12-21 ENCOUNTER — Encounter (HOSPITAL_COMMUNITY)
Admission: EM | Disposition: A | Discharge status: Home / Self Care | Payer: Self-pay | Source: Home / Self Care | Attending: Internal Medicine

## 2018-12-21 ENCOUNTER — Encounter (HOSPITAL_COMMUNITY): Payer: Self-pay | Admitting: Anesthesiology

## 2018-12-21 HISTORY — PX: HOT HEMOSTASIS: SHX5433

## 2018-12-21 HISTORY — PX: ENTEROSCOPY: SHX5533

## 2018-12-21 LAB — BASIC METABOLIC PANEL
Anion gap: 12 (ref 5–15)
BUN: 63 mg/dL — ABNORMAL HIGH (ref 8–23)
CO2: 25 mmol/L (ref 22–32)
Calcium: 9.3 mg/dL (ref 8.9–10.3)
Chloride: 100 mmol/L (ref 98–111)
Creatinine, Ser: 3.07 mg/dL — ABNORMAL HIGH (ref 0.44–1.00)
GFR calc non Af Amer: 14 mL/min — ABNORMAL LOW (ref >60)
GFR, EST AFRICAN AMERICAN: 17 mL/min — AB (ref >60)
Glucose, Bld: 97 mg/dL (ref 70–99)
Potassium: 3.6 mmol/L (ref 3.5–5.1)
Sodium: 137 mmol/L (ref 135–145)

## 2018-12-21 LAB — CBC
HCT: 24.8 % — ABNORMAL LOW (ref 36.0–46.0)
Hemoglobin: 7.2 g/dL — ABNORMAL LOW (ref 12.0–15.0)
MCH: 21.6 pg — ABNORMAL LOW (ref 26.0–34.0)
MCHC: 29 g/dL — ABNORMAL LOW (ref 30.0–36.0)
MCV: 74.5 fL — ABNORMAL LOW (ref 80.0–100.0)
Platelets: 145 10*3/uL — ABNORMAL LOW (ref 150–400)
RBC: 3.33 MIL/uL — ABNORMAL LOW (ref 3.87–5.11)
RDW: 28.5 % — ABNORMAL HIGH (ref 11.5–15.5)
WBC: 6.4 10*3/uL (ref 4.0–10.5)
nRBC: 2.8 % — ABNORMAL HIGH (ref 0.0–0.2)

## 2018-12-21 LAB — GLUCOSE, CAPILLARY
GLUCOSE-CAPILLARY: 147 mg/dL — AB (ref 70–99)
Glucose-Capillary: 104 mg/dL — ABNORMAL HIGH (ref 70–99)
Glucose-Capillary: 108 mg/dL — ABNORMAL HIGH (ref 70–99)
Glucose-Capillary: 75 mg/dL (ref 70–99)
Glucose-Capillary: 84 mg/dL (ref 70–99)
Glucose-Capillary: 91 mg/dL (ref 70–99)
Glucose-Capillary: 93 mg/dL (ref 70–99)

## 2018-12-21 LAB — HEMOGLOBIN AND HEMATOCRIT, BLOOD
HCT: 30.1 % — ABNORMAL LOW (ref 36.0–46.0)
Hemoglobin: 8.8 g/dL — ABNORMAL LOW (ref 12.0–15.0)

## 2018-12-21 LAB — PREPARE RBC (CROSSMATCH)

## 2018-12-21 SURGERY — ENTEROSCOPY
Anesthesia: Monitor Anesthesia Care

## 2018-12-21 MED ORDER — SODIUM CHLORIDE 0.9 % IV SOLN: 10.0000 mL/h | Freq: Once | INTRAVENOUS | Status: AC

## 2018-12-21 MED ORDER — SODIUM CHLORIDE 0.9% IV SOLUTION: Freq: Once | INTRAVENOUS | Status: DC

## 2018-12-21 MED ORDER — PROPOFOL 500 MG/50ML IV EMUL
INTRAVENOUS | Status: DC | PRN
  Administered 2018-12-21: 100 ug/kg/min via INTRAVENOUS

## 2018-12-21 MED ORDER — PROPOFOL 10 MG/ML IV BOLUS
INTRAVENOUS | Status: DC | PRN
  Administered 2018-12-21: 10 mg via INTRAVENOUS
  Administered 2018-12-21: 30 mg via INTRAVENOUS
  Administered 2018-12-21: 20 mg via INTRAVENOUS

## 2018-12-21 MED ORDER — PROPOFOL 10 MG/ML IV BOLUS
INTRAVENOUS | Status: AC
  Filled 2018-12-21: qty 20

## 2018-12-21 NOTE — Transfer of Care (Signed)
Immediate Anesthesia Transfer of Care Note  Patient: Kathleen [name]  Procedure(s) Performed: ENTEROSCOPY (N/A ) HOT HEMOSTASIS (ARGON PLASMA COAGULATION/BICAP) (N/A )  Patient Location: PACU  Anesthesia Type:MAC  Level of Consciousness: drowsy, patient cooperative and responds to stimulation  Airway & Oxygen Therapy: Patient Spontanous Breathing and Patient connected to nasal cannula oxygen  Post-op Assessment: Report given to RN and Post -op Vital signs reviewed and stable  Post vital signs: Reviewed and stable  Last Vitals:  Vitals Value Taken Time  BP 95/69 12/21/2018  3:20 PM  Temp    Pulse 80 12/21/2018  3:21 PM  Resp 22 12/21/2018  3:21 PM  SpO2 100 % 12/21/2018  3:21 PM  Vitals shown include unvalidated device data.  Last Pain:  Vitals:   12/21/18 1414  TempSrc: Oral  PainSc:          Complications: No apparent anesthesia complications

## 2018-12-21 NOTE — Op Note (Signed)
[facility] Patient Name: Kathleen [name] Procedure Date: 12/21/2018 MRN: [id number] Attending MD: Carol Ada , MD Date of Birth: 1946/11/18 CSN: [id number] Age: 73 Admit Type: Inpatient Procedure:                Small bowel enteroscopy Indications:              Iron deficiency anemia, Melena Providers:                Carol Ada, MD, Elmer Ramp. Tilden Dome, RN, [employer], Technician, Rosario Adie, CRNA Referring MD:              Medicines:                Propofol per Anesthesia Complications:            No immediate complications. Estimated Blood Loss:     Estimated blood loss: none. Procedure:                Pre-Anesthesia Assessment:                           - Prior to the procedure, a History and Physical                            was performed, and patient medications and                            allergies were reviewed. The patient's tolerance of                            previous anesthesia was also reviewed. The risks                            and benefits of the procedure and the sedation                            options and risks were discussed with the patient.                            All questions were answered, and informed consent                            was obtained. Prior Anticoagulants: The patient has                            taken Eliquis (apixaban), last dose was 2 days                            prior to procedure. ASA Grade Assessment: III - A                            patient with severe systemic disease. After  reviewing the risks and benefits, the patient was                            deemed in satisfactory condition to undergo the                            procedure.                           - Sedation was administered by an anesthesia                            professional. Deep sedation was attained.                           After obtaining informed consent, the  endoscope was                            passed under direct vision. Throughout the                            procedure, the patient's blood pressure, pulse, and                            oxygen saturations were monitored continuously. The                            PCF-H190DL ([id number] Olympus peds colonoscope was                            introduced through the mouth and advanced to the                            small bowel proximal to the Ligament of Treitz. The                            small bowel enteroscopy was accomplished without                            difficulty. The patient tolerated the procedure                            well. Scope In: Scope Out: Findings:      A single small no bleeding angiodysplastic lesion was found in the       gastric body. Coagulation for tissue destruction using laser was       successful. Estimated blood loss: none.      A single angiodysplastic lesion with no bleeding was found in the third       portion of the duodenum. Coagulation for tissue destruction using       monopolar probe was successful. Estimated blood loss: none.      Three angiodysplastic lesions with no bleeding were found in the       proximal jejunum. Coagulation for tissue destruction using monopolar       probe was successful. Estimated blood  loss: none.      Deep intubation of the proximal small bowel was achieved during both       passes. Two classic appearing AVMs were identified in the stomach and       duodenum with successful ablation. Several other smaller AVM-like       lesions were ablated in the jejunum. Impression:               - A single non-bleeding angiodysplastic lesion in                            the stomach. Treated with laser coagulation.                           - A single non-bleeding angiodysplastic lesion in                            the duodenum. Treated with a monopolar probe.                           - Three non-bleeding angiodysplastic  lesions in the                            duodenum. Treated with a monopolar probe.                           - No specimens collected. Recommendation:           - Return patient to hospital ward for ongoing care.                           - Resume regular diet.                           - Restart Eliquis in 3 days. Procedure Code(s):        --- Professional ---                           [phone], Small intestinal endoscopy, enteroscopy                            beyond second portion of duodenum, not including                            ileum; with ablation of tumor(s), polyp(s), or                            other lesion(s) not amenable to removal by hot                            biopsy forceps, bipolar cautery or snare technique Diagnosis Code(s):        --- Professional ---                           O29.476, Angiodysplasia of stomach and duodenum  without bleeding                           D50.9, Iron deficiency anemia, unspecified                           K92.1, Melena (includes Hematochezia) CPT copyright 2018 [employer]. All rights reserved. The codes documented in this report are preliminary and upon coder review may  be revised to meet current compliance requirements. Carol Ada, MD Carol Ada, MD 12/21/2018 3:16:14 PM This report has been signed electronically. Number of Addenda: 0

## 2018-12-21 NOTE — Anesthesia Preprocedure Evaluation (Addendum)
Anesthesia Evaluation  Patient identified by MRN, date of birth, ID band Patient awake    Reviewed: Allergy & Precautions, NPO status , Patient's Chart, lab work & pertinent test results  Airway Mallampati: II  TM Distance: >3 FB Neck ROM: Full    Dental no notable dental hx.    Pulmonary asthma , sleep apnea , former smoker,    Pulmonary exam normal breath sounds clear to auscultation       Cardiovascular hypertension, + CAD, + Past MI and + Cardiac Stents  Normal cardiovascular exam+ dysrhythmias Atrial Fibrillation  Rhythm:Regular Rate:Normal  Left ventricle: The cavity size was normal. Systolic function was   normal. The estimated ejection fraction was in the range of 60%   to 65%. Wall motion was normal; there were no regional wall   motion abnormalities. Doppler parameters are consistent with high   ventricular filling pressure. - Aortic valve: Trileaflet; mildly thickened, mildly calcified   leaflets. There was mild regurgitation. - Mitral valve: Calcified annulus. There was moderate regurgitation   directed eccentrically and toward the free wall. Mean gradient   (D): 4 mm Hg. Valve area by pressure half-time: 2.86 cm^2. Valve   area by continuity equation (using LVOT flow): 1.26 cm^2. - Left atrium: The atrium was moderately dilated. - Right ventricle: Systolic function was mildly reduced. - Right atrium: The atrium was moderately dilated. - Pulmonary arteries: PA peak pressure: 38 mm Hg (S).  Impressions:  - The right ventricular systolic pressure was increased consistent   with mild pulmonary hypertension.   Neuro/Psych negative neurological ROS  negative psych ROS   GI/Hepatic Neg liver ROS, GERD  ,  Endo/Other  diabetes  Renal/GU Renal InsufficiencyRenal disease  negative genitourinary   Musculoskeletal negative musculoskeletal ROS (+)   Abdominal   Peds negative pediatric ROS (+)   Hematology  (+) anemia , anticoagulated   Anesthesia Other Findings   Reproductive/Obstetrics negative OB ROS                            Anesthesia Physical Anesthesia Plan  ASA: IV  Anesthesia Plan: MAC   Post-op Pain Management:    Induction: Intravenous  PONV Risk Score and Plan:   Airway Management Planned: Simple Face Mask  Additional Equipment:   Intra-op Plan:   Post-operative Plan:   Informed Consent: I have reviewed the patients History and Physical, chart, labs and discussed the procedure including the risks, benefits and alternatives for the proposed anesthesia with the patient or authorized representative who has indicated his/her understanding and acceptance.   Dental advisory given  Plan Discussed with: CRNA and Surgeon  Anesthesia Plan Comments:         Anesthesia Quick Evaluation

## 2018-12-21 NOTE — Interval H&P Note (Signed)
History and Physical Interval Note:  12/21/2018 1:58 PM  Kathleen [name]  has presented today for surgery, with the diagnosis of GI bleed  The various methods of treatment have been discussed with the patient and family. After consideration of risks, benefits and other options for treatment, the patient has consented to  Procedure(s): ENTEROSCOPY (N/A) as a surgical intervention .  The patient's history has been reviewed, patient examined, no change in status, stable for surgery.  I have reviewed the patient's chart and labs.  Questions were answered to the patient's satisfaction.     Mekhai Venuto D

## 2018-12-21 NOTE — Progress Notes (Addendum)
[facility] PROGRESS NOTE  Kathleen [name]:[id number] DOB: 08/29/1946 DOA: 12/19/2018  PCP: Nolene Ebbs, MD  Brief History/Interval Summary: 73 y.o. female with medical history significant of DM2, HTN, CKD stage 3-4, A.Flutter on Eliquis. Patient was worked up for GI bleed 3 times last year (June, July, Sept).  Found gastric and small bowel AVMs.  After cautery, eliquis was resumed.  Patient was seen by her PCP with a one-month history of fatigue.  Blood work was done and was found to have low hemoglobin.  Patient was sent to the emergency department.  Reason for Visit: Chronic GI blood loss  Consultants: Gastroenterology  Procedures: Enteroscopy is planned for today  Antibiotics: None  Subjective/Interval History: Patient denies any complaints.  She is waiting on her procedure which is to be done this afternoon.  No abdominal pain.  No black stools or bright red blood per rectum.    ROS: Denies any shortness of breath  Objective:  Vital Signs  Vitals:   12/20/18 1421 12/20/18 2036 12/20/18 2058 12/21/18 0444  BP: 126/80  (!) 94/54 116/73  Pulse: 66  68 75  Resp: 18  16 14   Temp: 98 F (36.7 C)  97.8 F (36.6 C) 97.8 F (36.6 C)  TempSrc: Oral  Oral Oral  SpO2: 98% 99% 98% 100%  Weight:      Height:        Intake/Output Summary (Last 24 hours) at 12/21/2018 1234 Last data filed at 12/21/2018 1143 Gross per 24 hour  Intake 1740.01 ml  Output 850 ml  Net 890.01 ml   Filed Weights   12/19/18 2237  Weight: 101.6 kg   General appearance: Awake alert.  In no distress Resp: Clear to auscultation bilaterally.  Normal effort Cardio: S1-S2 is normal regular.  No S3-S4.  No rubs murmurs or bruit GI: Abdomen is soft.  Nontender nondistended.  Bowel sounds are present normal.  No masses organomegaly Extremities: No edema.  Full range of motion of lower extremities. Neurologic: Alert and oriented x3.  No focal neurological deficits.    Lab Results:  Data  Reviewed: I have personally reviewed following labs and imaging studies  CBC: Recent Labs  Lab 12/19/18 1852 12/20/18 0648 12/21/18 0426  WBC 7.1 6.2 6.4  HGB 5.2* 7.2* 7.2*  HCT 19.0* 24.4* 24.8*  MCV 68.8* 75.8* 74.5*  PLT 181 143* 145*    Basic Metabolic Panel: Recent Labs  Lab 12/19/18 1852 12/20/18 0648 12/21/18 0426  NA 135 137 137  K 3.5 3.7 3.6  CL 99 100 100  CO2 25 25 25   GLUCOSE 139* 88 97  BUN 66* 67* 63*  CREATININE 3.41* 3.31* 3.07*  CALCIUM 9.3 9.4 9.3    GFR: Estimated Creatinine Clearance: 18.7 mL/min (A) (by C-G formula based on SCr of 3.07 mg/dL (H)).  Liver Function Tests: Recent Labs  Lab 12/19/18 1852  AST 20  ALT 18  ALKPHOS 112  BILITOT 1.8*  PROT 7.4  ALBUMIN 3.5    CBG: Recent Labs  Lab 12/20/18 1631 12/20/18 2052 12/21/18 0009 12/21/18 0446 12/21/18 0732  GLUCAP 72 104* 108* 91 75     Radiology Studies: Dg Chest 2 View  Result Date: 12/19/2018 CLINICAL DATA:  Shortness of breath. Severe anemia. EXAM: CHEST - 2 VIEW COMPARISON:  06/22/2018 FINDINGS: Stable moderate cardiomegaly. Aortic atherosclerosis. Pulmonary vascular congestion noted, however there is no evidence of pulmonary infiltrate or edema. No evidence of pleural effusion. IMPRESSION: Cardiomegaly and pulmonary vascular congestion. No  active lung disease. Electronically Signed   By: Earle Gell M.D.   On: 12/19/2018 20:54     Medications:  Scheduled: . allopurinol  100 mg Oral Daily  . famotidine  20 mg Oral QHS  . insulin aspart  0-9 Units Subcutaneous Q4H  . metoprolol tartrate  75 mg Oral BID  . mometasone-formoterol  2 puff Inhalation BID  . pantoprazole  40 mg Oral Daily  . pravastatin  80 mg Oral Daily   Continuous: . sodium chloride 75 mL/hr at 12/20/18 2234  . sodium chloride    . sodium chloride     WVP:XTGGYIRSWNIOE **OR** acetaminophen, albuterol, cyclobenzaprine, docusate sodium, ondansetron **OR** ondansetron (ZOFRAN)  IV    Assessment/Plan:  Acute on Chronic Blood Loss Anemia Patient presented with hemoglobin of 5.  She was transfused 2 units.  Hemoglobin responded appropriately and is stable this morning.  May need to transfuse her 1 more unit of PRBC but will wait to the procedure is performed.   GI bleed, chronic, likely secondary to AVMs Gastroenterology consulted.  Continue PPI.  Plan is for enteroscopy today.  Acute kidney injury on chronic kidney disease 3 Baseline creatinine around 2.5 noted to be significantly elevated at 3.41 on admission with a BUN of 66.  This is most likely due to GI bleeding.  Patient was gently hydrated.  Creatinine improved this morning.  Continue hydration for now.  Monitor urine output.  Avoid nephrotoxic agents.    Essential hypertension Holding BiDil and ARB.  Metoprolol being continued.  Blood pressure is noted to be soft.  Continue to monitor.  History of atrial fibrillation Continue metoprolol.  Holding Eliquis due to bleeding issues.  Diabetes mellitus type 2 with chronic kidney disease Lantus on hold.  Continue SSI.  CBGs stable.  DVT Prophylaxis: SCDs    Code Status: Full code Family Communication: Discussed with the patient Disposition Plan: Enteroscopy today.  Recheck labs tomorrow.  Mobilize as tolerated.    LOS: 2 days   [facility] Pager [phone] 12/21/2018, 12:34 PM  If 7PM-7AM, please contact night-coverage at [email or web address], password [facility]

## 2018-12-21 NOTE — Anesthesia Procedure Notes (Signed)
Date/Time: 12/21/2018 2:39 PM Performed by: Glory Buff, CRNA Oxygen Delivery Method: Nasal cannula

## 2018-12-22 ENCOUNTER — Encounter (HOSPITAL_COMMUNITY): Payer: Self-pay | Admitting: Gastroenterology

## 2018-12-22 LAB — BPAM RBC
Blood Product Expiration Date: 202002092359
Blood Product Expiration Date: 202002092359
Blood Product Expiration Date: 202002092359
ISSUE DATE / TIME: 202001080222
ISSUE DATE / TIME: 202001091328
ISSUE DATE / TIME: 202001072355
Unit Type and Rh: 5100
Unit Type and Rh: 5100
Unit Type and Rh: 5100

## 2018-12-22 LAB — TYPE AND SCREEN
ABO/RH(D): O POS
ANTIBODY SCREEN: NEGATIVE
UNIT DIVISION: 0
UNIT DIVISION: 0
Unit division: 0

## 2018-12-22 LAB — CBC
HCT: 28.6 % — ABNORMAL LOW (ref 36.0–46.0)
Hemoglobin: 8.3 g/dL — ABNORMAL LOW (ref 12.0–15.0)
MCH: 22.6 pg — ABNORMAL LOW (ref 26.0–34.0)
MCHC: 29 g/dL — ABNORMAL LOW (ref 30.0–36.0)
MCV: 77.7 fL — ABNORMAL LOW (ref 80.0–100.0)
Platelets: 134 10*3/uL — ABNORMAL LOW (ref 150–400)
RBC: 3.68 MIL/uL — ABNORMAL LOW (ref 3.87–5.11)
RDW: 28.8 % — ABNORMAL HIGH (ref 11.5–15.5)
WBC: 6.8 10*3/uL (ref 4.0–10.5)
nRBC: 1.6 % — ABNORMAL HIGH (ref 0.0–0.2)

## 2018-12-22 LAB — BASIC METABOLIC PANEL
Anion gap: 11 (ref 5–15)
BUN: 55 mg/dL — ABNORMAL HIGH (ref 8–23)
CO2: 23 mmol/L (ref 22–32)
Calcium: 8.9 mg/dL (ref 8.9–10.3)
Chloride: 102 mmol/L (ref 98–111)
Creatinine, Ser: 2.87 mg/dL — ABNORMAL HIGH (ref 0.44–1.00)
GFR calc Af Amer: 18 mL/min — ABNORMAL LOW (ref >60)
GFR calc non Af Amer: 16 mL/min — ABNORMAL LOW (ref >60)
Glucose, Bld: 115 mg/dL — ABNORMAL HIGH (ref 70–99)
Potassium: 3.7 mmol/L (ref 3.5–5.1)
Sodium: 136 mmol/L (ref 135–145)

## 2018-12-22 LAB — GLUCOSE, CAPILLARY
Glucose-Capillary: 103 mg/dL — ABNORMAL HIGH (ref 70–99)
Glucose-Capillary: 107 mg/dL — ABNORMAL HIGH (ref 70–99)
Glucose-Capillary: 117 mg/dL — ABNORMAL HIGH (ref 70–99)
Glucose-Capillary: 112 mg/dL — ABNORMAL HIGH (ref 70–99)
Glucose-Capillary: 86 mg/dL (ref 70–99)

## 2018-12-22 LAB — HEMOGLOBIN AND HEMATOCRIT, BLOOD
HCT: 30.7 % — ABNORMAL LOW (ref 36.0–46.0)
Hemoglobin: 8.9 g/dL — ABNORMAL LOW (ref 12.0–15.0)

## 2018-12-22 MED ORDER — APIXABAN 5 MG PO TABS: 5.0000 mg | ORAL_TABLET | Freq: Two times a day (BID) | ORAL | Status: DC

## 2018-12-22 MED ORDER — FUROSEMIDE 80 MG PO TABS: 40.0000 mg | ORAL_TABLET | Freq: Two times a day (BID) | ORAL | Status: DC

## 2018-12-22 MED ORDER — LANTUS SOLOSTAR 100 UNIT/ML ~~LOC~~ SOPN: 10.0000 [IU] | PEN_INJECTOR | Freq: Every day | SUBCUTANEOUS | 11 refills | Status: DC

## 2018-12-22 NOTE — Progress Notes (Signed)
Subjective: No complaints.  Feeling well.  Objective: Vital signs in last 24 hours: Temp:  [97.5 F (36.4 C)-98.1 F (36.7 C)] 97.5 F (36.4 C) (01/10 0444) Pulse Rate:  [68-79] 72 (01/10 0444) Resp:  [17-19] 18 (01/10 0444) BP: (95-132)/(50-69) 102/53 (01/10 0444) SpO2:  [95 %-100 %] 95 % (01/10 1046) Last BM Date: 12/21/18  Intake/Output from previous day: 01/09 0701 - 01/10 0700 In: 2535.2 [P.O.:410; I.V.:1810.2; Blood:315] Out: 450 [Urine:450] Intake/Output this shift: No intake/output data recorded.  General appearance: alert and no distress GI: soft, non-tender; bowel sounds normal; no masses,  no organomegaly  Lab Results: Recent Labs    12/20/18 0648 12/21/18 0426 12/21/18 1841 12/22/18 0409  WBC 6.2 6.4  --  6.8  HGB 7.2* 7.2* 8.8* 8.3*  HCT 24.4* 24.8* 30.1* 28.6*  PLT 143* 145*  --  134*   BMET Recent Labs    12/20/18 0648 12/21/18 0426 12/22/18 0409  NA 137 137 136  K 3.7 3.6 3.7  CL 100 100 102  CO2 25 25 23   GLUCOSE 88 97 115*  BUN 67* 63* 55*  CREATININE 3.31* 3.07* 2.87*  CALCIUM 9.4 9.3 8.9   LFT Recent Labs    12/19/18 1852  PROT 7.4  ALBUMIN 3.5  AST 20  ALT 18  ALKPHOS 112  BILITOT 1.8*   PT/INR No results for input(s): LABPROT, INR in the last 72 hours. Hepatitis Panel No results for input(s): HEPBSAG, HCVAB, HEPAIGM, HEPBIGM in the last 72 hours. C-Diff No results for input(s): CDIFFTOX in the last 72 hours. Fecal Lactopherrin No results for input(s): FECLLACTOFRN in the last 72 hours.  Studies/Results: No results found.  Medications:  Scheduled: . sodium chloride   Intravenous Once  . allopurinol  100 mg Oral Daily  . famotidine  20 mg Oral QHS  . insulin aspart  0-9 Units Subcutaneous Q4H  . metoprolol tartrate  75 mg Oral BID  . mometasone-formoterol  2 puff Inhalation BID  . pantoprazole  40 mg Oral Daily  . pravastatin  80 mg Oral Daily   Continuous: . sodium chloride 75 mL/hr at 12/22/18 0333     Assessment/Plan: 1) AVMs s/p APC. 2) Anemia.   The patient is stable.  HGB is relatively stable.  She can be discharged home and follow up in the office in 2-4 weeks.  LOS: 3 days   Lorinda Copland D 12/22/2018, 11:04 AM

## 2018-12-22 NOTE — Discharge Summary (Signed)
[facility]  Physician Discharge Summary   Patient ID: Kathleen [name] MRN: [id number] DOB/AGE: [birth year] 73 y.o.  Admit date: 12/19/2018 Discharge date: 12/22/2018  PCP: Nolene Ebbs, MD  DISCHARGE DIAGNOSES:  Acute on chronic blood loss anemia Chronic GI bleed secondary to AVM Acute on chronic kidney disease stage III Essential hypertension History of atrial fibrillation, persistent on Eliquis Diabetes mellitus type 2 with chronic kidney disease  RECOMMENDATIONS FOR OUTPATIENT FOLLOW UP: 1. Patient asked to follow-up with her PCP within a week for CBC and metabolic panel. 2. GI to follow-up in 2 to 4 weeks 3. Resume Eliquis in 3 days 4. Resume Lasix in 4 days   DISCHARGE CONDITION: fair  Diet recommendation: As before  [facility] Weights   12/19/18 2237  Weight: 101.6 kg    INITIAL HISTORY: 73 y.o.femalewith medical history significant ofDM2, HTN, CKDstage 3-4, A.Flutter on Eliquis. Patient was worked up for GI bleed 3 times last year (June, July, Sept). Found gastric and small bowel AVMs. After cautery, eliquiswas resumed.  Patient was seen by her PCP with a one-month history of fatigue.  Blood work was done and was found to have low hemoglobin.  Patient was sent to the emergency department.  Consultants: Gastroenterology  Procedures:  Small bowel enteroscopy Impression:               - A single non-bleeding angiodysplastic lesion in                            the stomach. Treated with laser coagulation.                           - A single non-bleeding angiodysplastic lesion in                            the duodenum. Treated with a monopolar probe.                           - Three non-bleeding angiodysplastic lesions in the                            duodenum. Treated with a monopolar probe.                           - No specimens collected.   HOSPITAL COURSE:   Acute on Chronic Blood Loss Anemia Patient presented with hemoglobin of 5.   She was transfused 2 units.  Hemoglobin responded appropriately.  He was transfused an additional unit on 1/9.  Hemoglobin is stable this morning.  GI bleed, chronic, likely secondary to AVMs Gastroenterology consulted.   Continued on PPI.  She underwent small bowel enteroscopy.  Please see impression above.  No active bleeding was noted.  Cleared by gastroenterology to resume Eliquis in 3 days.  Acute kidney injury on chronic kidney disease 3 Baseline creatinine around 2.5 noted to be significantly elevated at 3.41 on admission with a BUN of 66.  This is most likely due to GI bleeding.  Patient was gently hydrated.  Creatinine continues to improve.  Hold Lasix for a few days.  Recheck renal function in the outpatient setting.  Essential hypertension Continue home medications.  Okay to resume ARB since patient's creatinine is close  to her baseline.  Would recommend rechecking her renal function in the outpatient setting.  History of atrial fibrillation, persistent Continue home medications.  Resume Eliquis after 3 days as recommended by gastroenterology  Diabetes mellitus type 2 with chronic kidney disease CBGs running low.  Patient has to hold her Lantus for now and to check glucose levels at home.  To take Lantus only if the level is greater than 180.   Overall stable.  Okay for discharge home today.    PERTINENT LABS:  The results of significant diagnostics from this hospitalization (including imaging, microbiology, ancillary and laboratory) are listed below for reference.      Labs: Basic Metabolic Panel: Recent Labs  Lab 12/19/18 1852 12/20/18 0648 12/21/18 0426 12/22/18 0409  NA 135 137 137 136  K 3.5 3.7 3.6 3.7  CL 99 100 100 102  CO2 25 25 25 23   GLUCOSE 139* 88 97 115*  BUN 66* 67* 63* 55*  CREATININE 3.41* 3.31* 3.07* 2.87*  CALCIUM 9.3 9.4 9.3 8.9   Liver Function Tests: Recent Labs  Lab 12/19/18 1852  AST 20  ALT 18  ALKPHOS 112  BILITOT 1.8*    PROT 7.4  ALBUMIN 3.5   CBC: Recent Labs  Lab 12/19/18 1852 12/20/18 0648 12/21/18 0426 12/21/18 1841 12/22/18 0409 12/22/18 1258  WBC 7.1 6.2 6.4  --  6.8  --   HGB 5.2* 7.2* 7.2* 8.8* 8.3* 8.9*  HCT 19.0* 24.4* 24.8* 30.1* 28.6* 30.7*  MCV 68.8* 75.8* 74.5*  --  77.7*  --   PLT 181 143* 145*  --  134*  --    BNP: BNP (last 3 results) Recent Labs    12/19/18 2020  BNP 863.6*     CBG: Recent Labs  Lab 12/22/18 0052 12/22/18 0253 12/22/18 0446 12/22/18 0720 12/22/18 1149  GLUCAP 117* 103* 107* 112* 86     IMAGING STUDIES Dg Chest 2 View  Result Date: 12/19/2018 CLINICAL DATA:  Shortness of breath. Severe anemia. EXAM: CHEST - 2 VIEW COMPARISON:  06/22/2018 FINDINGS: Stable moderate cardiomegaly. Aortic atherosclerosis. Pulmonary vascular congestion noted, however there is no evidence of pulmonary infiltrate or edema. No evidence of pleural effusion. IMPRESSION: Cardiomegaly and pulmonary vascular congestion. No active lung disease. Electronically Signed   By: Earle Gell M.D.   On: 12/19/2018 20:54    DISCHARGE EXAMINATION: Vitals:   12/21/18 2048 12/21/18 2151 12/22/18 0444 12/22/18 1046  BP: (!) 116/57  (!) 102/53   Pulse: 71  72   Resp: 18  18   Temp: 98.1 F (36.7 C)  (!) 97.5 F (36.4 C)   TempSrc: Oral     SpO2: 98% 98% 100% 95%  Weight:      Height:       General appearance: Awake alert.  In no distress Resp: Clear to auscultation bilaterally.  Normal effort Cardio: S1-S2 is normal regular.  No S3-S4.  No rubs murmurs or bruit GI: Abdomen is soft.  Nontender nondistended.  Bowel sounds are present normal.  No masses organomegaly Extremities: No edema.  Full range of motion of lower extremities. Neurologic: Alert and oriented x3.  No focal neurological deficits.    DISPOSITION: Home  Discharge Instructions    Call MD for:  difficulty breathing, headache or visual disturbances   Complete by:  As directed    Call MD for:  extreme fatigue    Complete by:  As directed    Call MD for:  persistant dizziness or light-headedness  Complete by:  As directed    Call MD for:  persistant nausea and vomiting   Complete by:  As directed    Call MD for:  severe uncontrolled pain   Complete by:  As directed    Call MD for:  temperature >100.4   Complete by:  As directed    Diet Carb Modified   Complete by:  As directed    Discharge instructions   Complete by:  As directed    1.  Please resume Eliquis only on 12/25/2018 2.  Do not take your Lantus insulin unless your blood glucose level is greater than 180 3.  Please see your primary care provider next week to have blood work done to check your hemoglobin as well as your kidney function 4.  Resume Lasix only on 12/26/2018 5.  Seek attention immediately if you again get fatigued, feel lightheaded, will get more short of breath than usual.  You were cared for by a hospitalist during your hospital stay. If you have any questions about your discharge medications or the care you received while you were in the hospital after you are discharged, you can call the unit and asked to speak with the hospitalist on call if the hospitalist that took care of you is not available. Once you are discharged, your primary care physician will handle any further medical issues. Please note that NO REFILLS for any discharge medications will be authorized once you are discharged, as it is imperative that you return to your primary care physician (or establish a relationship with a primary care physician if you do not have one) for your aftercare needs so that they can reassess your need for medications and monitor your lab values. If you do not have a primary care physician, you can call [phone] for a physician referral.   Increase activity slowly   Complete by:  As directed         Allergies as of 12/22/2018      Reactions   Penicillins Hives, Shortness Of Breath   Tolerates Ceftin, Ceftriaxone, and  Cefepime Has patient had a PCN reaction causing immediate rash, facial/tongue/throat swelling, SOB or lightheadedness with hypotension: Y Has patient had a PCN reaction causing severe rash involving mucus membranes or skin necrosis: Y Has patient had a PCN reaction that required hospitalization: Y Has patient had a PCN reaction occurring within the last 10 years: N      Medication List    TAKE these medications   albuterol (2.5 MG/3ML) 0.083% nebulizer solution Commonly known as:  PROVENTIL Take 2.5 mg by nebulization every 6 (six) hours as needed for wheezing or shortness of breath.   albuterol 108 (90 Base) MCG/ACT inhaler Commonly known as:  PROVENTIL HFA;VENTOLIN HFA Inhale 1-2 puffs into the lungs every 4 (four) hours as needed for wheezing or shortness of breath.   allopurinol 100 MG tablet Commonly known as:  ZYLOPRIM Take 100 mg by mouth daily.   apixaban 5 MG Tabs tablet Commonly known as:  ELIQUIS Take 1 tablet (5 mg total) by mouth 2 (two) times daily. Please resume on 12/25/2018 Start taking on:  December 25, 2018 What changed:    additional instructions  These instructions start on December 25, 2018. If you are unsure what to do until then, ask your doctor or other care provider.   cyclobenzaprine 10 MG tablet Commonly known as:  FLEXERIL Take 10 mg by mouth 2 (two) times daily as needed for muscle spasms.  docusate sodium 100 MG capsule Commonly known as:  COLACE Take 100 mg by mouth 2 (two) times daily as needed for mild constipation.   famotidine 20 MG tablet Commonly known as:  PEPCID Take 20 mg by mouth at bedtime.   furosemide 80 MG tablet Commonly known as:  LASIX Take 0.5 tablets (40 mg total) by mouth 2 (two) times daily. Resume only on 12/26/2018 Start taking on:  December 26, 2018 What changed:    additional instructions  These instructions start on December 26, 2018. If you are unsure what to do until then, ask your doctor or other care  provider.   irbesartan 75 MG tablet Commonly known as:  AVAPRO Take 75 mg by mouth daily.   isosorbide-hydrALAZINE 20-37.5 MG tablet Commonly known as:  BIDIL Take 1 tablet by mouth 3 (three) times daily.   LANTUS SOLOSTAR 100 UNIT/ML Solostar Pen Generic drug:  Insulin Glargine Inject 10 Units into the skin daily. Please take only if your blood glucose levels are greater than 180. What changed:  additional instructions   metoprolol tartrate 50 MG tablet Commonly known as:  LOPRESSOR Take 1 tablet (50 mg total) by mouth 2 (two) times daily. What changed:  how much to take   pantoprazole 40 MG tablet Commonly known as:  PROTONIX Take 1 tablet (40 mg total) by mouth daily.   pravastatin 80 MG tablet Commonly known as:  PRAVACHOL Take 80 mg by mouth daily.   SYMBICORT 80-4.5 MCG/ACT inhaler Generic drug:  budesonide-formoterol Inhale 2 puffs into the lungs 2 (two) times daily.        Follow-up Information    Carol Ada, MD. Schedule an appointment as soon as possible for a visit in 2 week(s).   Specialty:  Gastroenterology Contact information: [place], [place] [address] [phone]        Nolene Ebbs, MD. Schedule an appointment as soon as possible for a visit in 5 day(s).   Specialty:  Internal Medicine Why:  to check hemoglobin and kidney function  Contact information: [place] [address] [phone]           TOTAL DISCHARGE TIME: 35 minutes  Bonnielee Haff  [facility] Pager [phone]  12/22/2018, 3:11 PM

## 2018-12-22 NOTE — Care Management Important Message (Signed)
Important Message  Patient Details  Name: LEILI ESKENAZI MRN: [id number] Date of Birth: 1946-11-21   Medicare Important Message Given:  Yes    Kerin Salen 12/22/2018, 12:50 [place] Message  Patient Details  Name: DERRY ARBOGAST MRN: [id number] Date of Birth: August 01, 1946   Medicare Important Message Given:  Yes    Kerin Salen 12/22/2018, 12:50 PM

## 2018-12-22 NOTE — Discharge Instructions (Signed)
Gastrointestinal Arteriovenous Malformations    A gastrointestinal arteriovenous malformation is a rare defect of tangled veins and arteries in the intestine. This defect can occur anywhere in the intestine. The defect creates an abnormal collection of blood vessels that can put pressure on other parts of your body that are close by. It causes blood vessels to expand (dilate) over time and sometimes bleed. Many people with this defect never have any symptoms. This defect may be present at birth (congenital).  What are the causes?  The cause of this condition is not known. It may be related to genetic causes or other factors.  What are the signs or symptoms?  Signs and symptoms often do not appear until you are older and may include:  Blood in your stool.  Black or dark red stools.  Weakness.  Tiredness.  Shortness of breath.  Iron deficiency anemia.  How is this diagnosed?  This condition is diagnosed based on a physical exam and a medical history. Imaging tests may also be done, such as:  X-ray.  CT scan.  Angiogram. This is a procedure used to examine the blood vessels. In this procedure, contrast dye is injected through a thin tube (catheter) into an artery. X-rays are then taken, which show if there is a blockage or problem in a blood vessel.  Endoscopy. In this procedure, your health care provider passes a thin, flexible tube (endoscope) through your mouth and down your esophagus into your stomach. A small camera is attached to the end of the tube. Images from the camera appear on a monitor in the exam room.  Small bowel enteroscopy. This is a procedure that uses a long thin scope to visualize areas in the small bowel.  Capsule endoscopy. In this procedure, you will swallow a small capsule that has a tiny camera and transmitter in it. This capsule naturally travels through your digestive system. The camera takes photos of the small intestine along the way.  How is this treated?  The goal of treatment is to stop  blood loss and prevent any future bleeding. Treatment may include:  Procedures to stop the bleeding or to destroy the affected vessels.  Abdominal surgery to remove the affected section of the digestive system.  Blood transfusion to replace blood loss.  Iron supplements for iron deficiency anemia.  Follow these instructions at home:  Take over-the-counter and prescription medicines only as told by your health care provider. This includes iron supplements, if you are told to use them.  Keep all follow-up visits as told by your health care provider. This is important.  Contact a health care provider if:  You have streaks of blood in your stool for a long time.  Your stool is black or deep red.  You have signs of anemia such as weakness, fatigue, or shortness of breath.  You have new symptoms.  Get help right away if:  You have heavy bleeding during a bowel movement.  Summary  Gastrointestinal arteriovenous malformation refers to an abnormal collection of blood vessels that can put pressure on other parts of your body that are close by. It causes blood vessels to dilate over time and sometimes bleed.  Many people with this defect never have any symptoms.  The goal of treatment is to stop blood loss and prevent any future bleeding.  This information is not intended to replace advice given to you by your health care provider. Make sure you discuss any questions you have with your health care provider.    [employer] ° °

## 2018-12-22 NOTE — Progress Notes (Signed)
Pt ambulated in hallway 136ft with [place] and standby assistance. Tolerated well. Pt became slightly SOB about halfway through the walk. Pt took a brief standing break and resumed walking back to room. Meryem Haertel, Bing Neighbors, RN

## 2019-01-01 NOTE — Anesthesia Postprocedure Evaluation (Signed)
Anesthesia Post Note  Patient: Kathleen [name]  Procedure(s) Performed: ENTEROSCOPY (N/A ) HOT HEMOSTASIS (ARGON PLASMA COAGULATION/BICAP) (N/A )     Patient location during evaluation: PACU Anesthesia Type: MAC Level of consciousness: awake and alert Pain management: pain level controlled Vital Signs Assessment: post-procedure vital signs reviewed and stable Respiratory status: spontaneous breathing, nonlabored ventilation, respiratory function stable and patient connected to nasal cannula oxygen Cardiovascular status: stable and blood pressure returned to baseline Postop Assessment: no apparent nausea or vomiting Anesthetic complications: no    Last Vitals:  Vitals:   12/22/18 0444 12/22/18 1046  BP: (!) 102/53   Pulse: 72   Resp: 18   Temp: (!) 36.4 C   SpO2: 100% 95%    Last Pain:  Vitals:   12/22/18 1035  TempSrc:   PainSc: 0-No pain                 Zaydin Billey S

## 2019-01-19 ENCOUNTER — Ambulatory Visit: Payer: Medicare Other | Admitting: Podiatry

## 2019-01-22 ENCOUNTER — Ambulatory Visit (INDEPENDENT_AMBULATORY_CARE_PROVIDER_SITE_OTHER): Payer: Medicare Other | Admitting: Podiatry

## 2019-01-22 ENCOUNTER — Encounter: Payer: Self-pay | Admitting: Podiatry

## 2019-01-22 ENCOUNTER — Telehealth: Payer: Self-pay | Admitting: *Deleted

## 2019-01-22 DIAGNOSIS — M79675 Pain in left toe(s): Secondary | ICD-10-CM | POA: Diagnosis not present

## 2019-01-22 DIAGNOSIS — E1151 Type 2 diabetes mellitus with diabetic peripheral angiopathy without gangrene: Secondary | ICD-10-CM

## 2019-01-22 DIAGNOSIS — M79674 Pain in right toe(s): Secondary | ICD-10-CM

## 2019-01-22 DIAGNOSIS — L84 Corns and callosities: Secondary | ICD-10-CM | POA: Diagnosis not present

## 2019-01-22 DIAGNOSIS — B351 Tinea unguium: Secondary | ICD-10-CM | POA: Diagnosis not present

## 2019-01-22 NOTE — Telephone Encounter (Signed)
-----   Message from Marzetta Board, DPM sent at 01/22/2019  1:00 PM EST ----- Regarding: Vascular Physician Dr. Katha Hamming Val,  Can you call Ms. Grabill's daughter and let her know she last saw Dr. Kathlyn Sacramento (this is her circulation/heart Doctor) in September 2019. She is scheduled to see him again in September 2020. She can always call and schedule an earlier appointment for re-evaluation of her symptoms.  She has no wounds on her feet/legs. As discussed, we will work on getting her diabetic shoes.  She should call her PCP or Dr. Fletcher Anon if she feels her fluid pills are not working for her thigh swelling. If she experiences SOB and unable to reach a doctor, she should go to ED.  Thanks!

## 2019-01-22 NOTE — Telephone Encounter (Signed)
I informed Baldo Ash of Dr. Heber Avon recommendations.

## 2019-01-22 NOTE — Progress Notes (Addendum)
Subjective: Kathleen [name] is a 73 y.o. y.o. female who presents today accompanied by her daughter.  She presents with painful, discolored, thick toenails  which interfere with daily activities. Pain is aggravated when wearing enclosed shoe gear. Pain is relieved with periodic professional debridement.  Kathleen [name] states she is on fluid pills for her b/l thigh swelling. She takes 80 mg furosemide. She takes 40 mg (1/2 pill) in the morning and 80 mg (1 pill) at night.  She feels her thighs have not gone down.   She states she does have to stop and rest due to SOB when walking long distances.   She remains on Eliquis.   She sees Kathleen [name] for Cardiology.  Patient's PCP is Kathleen Ebbs, MD .   Current Outpatient Medications:  .  albuterol (PROVENTIL HFA;VENTOLIN HFA) 108 (90 Base) MCG/ACT inhaler, Inhale 1-2 puffs into the lungs every 4 (four) hours as needed for wheezing or shortness of breath., Disp: 1 Inhaler, Rfl: 0 .  albuterol (PROVENTIL) (2.5 MG/3ML) 0.083% nebulizer solution, Take 2.5 mg by nebulization every 6 (six) hours as needed for wheezing or shortness of breath., Disp: , Rfl:  .  allopurinol (ZYLOPRIM) 100 MG tablet, Take 100 mg by mouth daily., Disp: , Rfl:  .  apixaban (ELIQUIS) 5 MG TABS tablet, Take 1 tablet (5 mg total) by mouth 2 (two) times daily. Please resume on 12/25/2018, Disp: 60 tablet, Rfl:  .  cyclobenzaprine (FLEXERIL) 10 MG tablet, Take 10 mg by mouth 2 (two) times daily as needed for muscle spasms. , Disp: , Rfl:  .  docusate sodium (COLACE) 100 MG capsule, Take 100 mg by mouth 2 (two) times daily as needed for mild constipation., Disp: , Rfl:  .  famotidine (PEPCID) 20 MG tablet, Take 20 mg by mouth at bedtime. , Disp: , Rfl:  .  furosemide (LASIX) 80 MG tablet, Take 0.5 tablets (40 mg total) by mouth 2 (two) times daily. Resume only on 12/26/2018, Disp: , Rfl:  .  irbesartan (AVAPRO) 75 MG tablet, Take 75 mg by mouth daily., Disp: , Rfl:  .   isosorbide-hydrALAZINE (BIDIL) 20-37.5 MG tablet, Take 1 tablet by mouth 3 (three) times daily., Disp: 90 tablet, Rfl: 0 .  LANTUS SOLOSTAR 100 UNIT/ML Solostar Pen, Inject 10 Units into the skin daily. Please take only if your blood glucose levels are greater than 180., Disp: 15 mL, Rfl: 11 .  metoprolol tartrate (LOPRESSOR) 50 MG tablet, Take 1 tablet (50 mg total) by mouth 2 (two) times daily. (Patient taking differently: Take 75 mg by mouth 2 (two) times daily. ), Disp: 60 tablet, Rfl: 0 .  pantoprazole (PROTONIX) 40 MG tablet, Take 1 tablet (40 mg total) by mouth daily., Disp: 30 tablet, Rfl: 1 .  pravastatin (PRAVACHOL) 80 MG tablet, Take 80 mg by mouth daily., Disp: , Rfl:  .  SYMBICORT 80-4.5 MCG/ACT inhaler, Inhale 2 puffs into the lungs 2 (two) times daily., Disp: , Rfl: 0   Allergies  Allergen Reactions  . Penicillins Hives and Shortness Of Breath    Tolerates Ceftin, Ceftriaxone, and Cefepime Has patient had a PCN reaction causing immediate rash, facial/tongue/throat swelling, SOB or lightheadedness with hypotension: Y Has patient had a PCN reaction causing severe rash involving mucus membranes or skin necrosis: Y Has patient had a PCN reaction that required hospitalization: Y Has patient had a PCN reaction occurring within the last 10 years: N      Objective: Vascular Examination: Capillary refill  time immediate x 10 digits Dorsalis pedis pulses 0/4 b/l Posterior tibial pulses 0/4 b/l No digital hair x 10 digits Skin temperature gradient warm to cool. Edema BLE, nonpitting. No pain with calf compression bilaterally. No tissue loss. No ischemia. No gangrene.  Dermatological Examination: Skin with changes consistent with chronic LE edema hyperpigmentation  Toenails 1-5 b/l discolored, thick, dystrophic with subungual debris and pain with palpation to nailbeds due to thickness of nails.  Hyperkeratotic lesions dorsal 5th digit PIPJ b/l.  No edema, no erythema, no  drainage, no flocculence noted.  Musculoskeletal: Muscle strength 5/5 to all LE muscle groups  Hammertoes 2-5 b/l  Mild bunion deformity b/l  Neurological: Sensation intact with 10 gram monofilament. Vibratory sensation intact.  Assessment: Painful onychomycosis toenails 1-5 b/l in patient on blood thinner.  Hammertoes 2-5 b/l Corns b/l 5th digits NIDDM with PAD  Plan: 1. Toenails 1-5 b/l were debrided in length and girth without iatrogenic bleeding. 2. Patient to continue soft, supportive shoe gear.  We discussed diabetic shoe gear on today.  We will start the process of obtaining diabetic shoes for her.  Qualifying diagnoses for diabetic shoes are NIDDM with PAD, hammertoes 2 through 5 bilaterally, mild bunion deformity bilaterally.  Once certification paperwork is received from her primary care physician, our prosthetics department will give her a call so that she can be measured for her diabetic shoes. 3. Patient to report any pedal injuries to medical professional immediately. 4. Avoid self trimming due to use of blood thinner. 5. Follow up 3 months.  6. Patient/POA to call should there be a concern in the interim.

## 2019-01-22 NOTE — Telephone Encounter (Signed)
Unable to leave a message for Kathleen [name], voicemail states person is not available.

## 2019-01-22 NOTE — Patient Instructions (Signed)

## 2019-01-26 ENCOUNTER — Ambulatory Visit: Payer: Medicare Other | Admitting: Orthotics

## 2019-01-26 ENCOUNTER — Encounter: Payer: Self-pay | Admitting: *Deleted

## 2019-01-26 NOTE — Telephone Encounter (Signed)
Unable to leave message for Dorthey Sawyer, voicemail states person is not available. Mailed letter Attn: Dorthey Sawyer, with Dr. Heber Kent message of 01/22/2019 1:00pm.

## 2019-02-01 ENCOUNTER — Encounter (HOSPITAL_COMMUNITY): Payer: Self-pay | Admitting: *Deleted

## 2019-02-01 ENCOUNTER — Other Ambulatory Visit: Payer: Self-pay

## 2019-02-01 ENCOUNTER — Inpatient Hospital Stay (HOSPITAL_COMMUNITY)
Admission: EM | Admit: 2019-02-01 | Discharge: 2019-02-05 | DRG: 378 | Disposition: A | Discharge status: Home / Self Care | Payer: Medicare Other | Attending: Family Medicine | Admitting: Family Medicine

## 2019-02-01 DIAGNOSIS — N189 Chronic kidney disease, unspecified: Secondary | ICD-10-CM

## 2019-02-01 DIAGNOSIS — Z955 Presence of coronary angioplasty implant and graft: Secondary | ICD-10-CM

## 2019-02-01 DIAGNOSIS — K219 Gastro-esophageal reflux disease without esophagitis: Secondary | ICD-10-CM | POA: Diagnosis present

## 2019-02-01 DIAGNOSIS — Z7901 Long term (current) use of anticoagulants: Secondary | ICD-10-CM

## 2019-02-01 DIAGNOSIS — D62 Acute posthemorrhagic anemia: Secondary | ICD-10-CM | POA: Diagnosis present

## 2019-02-01 DIAGNOSIS — I130 Hypertensive heart and chronic kidney disease with heart failure and stage 1 through stage 4 chronic kidney disease, or unspecified chronic kidney disease: Secondary | ICD-10-CM | POA: Diagnosis present

## 2019-02-01 DIAGNOSIS — N184 Chronic kidney disease, stage 4 (severe): Secondary | ICD-10-CM | POA: Diagnosis present

## 2019-02-01 DIAGNOSIS — E11649 Type 2 diabetes mellitus with hypoglycemia without coma: Secondary | ICD-10-CM | POA: Diagnosis not present

## 2019-02-01 DIAGNOSIS — I482 Chronic atrial fibrillation, unspecified: Secondary | ICD-10-CM | POA: Diagnosis present

## 2019-02-01 DIAGNOSIS — Z8249 Family history of ischemic heart disease and other diseases of the circulatory system: Secondary | ICD-10-CM

## 2019-02-01 DIAGNOSIS — T8089XA Other complications following infusion, transfusion and therapeutic injection, initial encounter: Secondary | ICD-10-CM | POA: Diagnosis not present

## 2019-02-01 DIAGNOSIS — I4892 Unspecified atrial flutter: Secondary | ICD-10-CM | POA: Diagnosis present

## 2019-02-01 DIAGNOSIS — I10 Essential (primary) hypertension: Secondary | ICD-10-CM | POA: Diagnosis not present

## 2019-02-01 DIAGNOSIS — Z7951 Long term (current) use of inhaled steroids: Secondary | ICD-10-CM | POA: Diagnosis not present

## 2019-02-01 DIAGNOSIS — Y92230 Patient room in hospital as the place of occurrence of the external cause: Secondary | ICD-10-CM | POA: Diagnosis not present

## 2019-02-01 DIAGNOSIS — Z79899 Other long term (current) drug therapy: Secondary | ICD-10-CM | POA: Diagnosis not present

## 2019-02-01 DIAGNOSIS — E1122 Type 2 diabetes mellitus with diabetic chronic kidney disease: Secondary | ICD-10-CM | POA: Diagnosis present

## 2019-02-01 DIAGNOSIS — K921 Melena: Secondary | ICD-10-CM | POA: Diagnosis not present

## 2019-02-01 DIAGNOSIS — K31819 Angiodysplasia of stomach and duodenum without bleeding: Secondary | ICD-10-CM

## 2019-02-01 DIAGNOSIS — Z794 Long term (current) use of insulin: Secondary | ICD-10-CM

## 2019-02-01 DIAGNOSIS — N179 Acute kidney failure, unspecified: Secondary | ICD-10-CM | POA: Diagnosis present

## 2019-02-01 DIAGNOSIS — E119 Type 2 diabetes mellitus without complications: Secondary | ICD-10-CM

## 2019-02-01 DIAGNOSIS — Z8261 Family history of arthritis: Secondary | ICD-10-CM

## 2019-02-01 DIAGNOSIS — K922 Gastrointestinal hemorrhage, unspecified: Secondary | ICD-10-CM

## 2019-02-01 DIAGNOSIS — I5032 Chronic diastolic (congestive) heart failure: Secondary | ICD-10-CM | POA: Diagnosis present

## 2019-02-01 DIAGNOSIS — D649 Anemia, unspecified: Secondary | ICD-10-CM

## 2019-02-01 DIAGNOSIS — Y718 Miscellaneous cardiovascular devices associated with adverse incidents, not elsewhere classified: Secondary | ICD-10-CM | POA: Diagnosis not present

## 2019-02-01 DIAGNOSIS — Z87891 Personal history of nicotine dependence: Secondary | ICD-10-CM

## 2019-02-01 DIAGNOSIS — K31811 Angiodysplasia of stomach and duodenum with bleeding: Secondary | ICD-10-CM | POA: Diagnosis present

## 2019-02-01 DIAGNOSIS — Z880 Allergy status to penicillin: Secondary | ICD-10-CM

## 2019-02-01 DIAGNOSIS — D500 Iron deficiency anemia secondary to blood loss (chronic): Secondary | ICD-10-CM

## 2019-02-01 DIAGNOSIS — K5909 Other constipation: Secondary | ICD-10-CM | POA: Diagnosis present

## 2019-02-01 DIAGNOSIS — I2510 Atherosclerotic heart disease of native coronary artery without angina pectoris: Secondary | ICD-10-CM | POA: Diagnosis present

## 2019-02-01 DIAGNOSIS — Z8269 Family history of other diseases of the musculoskeletal system and connective tissue: Secondary | ICD-10-CM

## 2019-02-01 LAB — COMPREHENSIVE METABOLIC PANEL
ALT: 14 U/L (ref 0–44)
AST: 23 U/L (ref 15–41)
Albumin: 3.7 g/dL (ref 3.5–5.0)
Alkaline Phosphatase: 114 U/L (ref 38–126)
Anion gap: 12 (ref 5–15)
BUN: 92 mg/dL — ABNORMAL HIGH (ref 8–23)
CO2: 27 mmol/L (ref 22–32)
Calcium: 9.4 mg/dL (ref 8.9–10.3)
Chloride: 100 mmol/L (ref 98–111)
Creatinine, Ser: 3.83 mg/dL — ABNORMAL HIGH (ref 0.44–1.00)
GFR calc Af Amer: 13 mL/min — ABNORMAL LOW (ref >60)
GFR calc non Af Amer: 11 mL/min — ABNORMAL LOW (ref >60)
Glucose, Bld: 141 mg/dL — ABNORMAL HIGH (ref 70–99)
Potassium: 3.7 mmol/L (ref 3.5–5.1)
Sodium: 139 mmol/L (ref 135–145)
Total Bilirubin: 1.8 mg/dL — ABNORMAL HIGH (ref 0.3–1.2)
Total Protein: 7.5 g/dL (ref 6.5–8.1)

## 2019-02-01 LAB — CBG MONITORING, ED: GLUCOSE-CAPILLARY: 113 mg/dL — AB (ref 70–99)

## 2019-02-01 LAB — CBC
HCT: 19.6 % — ABNORMAL LOW (ref 36.0–46.0)
Hemoglobin: 5.6 g/dL — CL (ref 12.0–15.0)
MCH: 22.4 pg — ABNORMAL LOW (ref 26.0–34.0)
MCHC: 28.6 g/dL — ABNORMAL LOW (ref 30.0–36.0)
MCV: 78.4 fL — ABNORMAL LOW (ref 80.0–100.0)
Platelets: 166 10*3/uL (ref 150–400)
RBC: 2.5 MIL/uL — ABNORMAL LOW (ref 3.87–5.11)
RDW: 27.2 % — ABNORMAL HIGH (ref 11.5–15.5)
WBC: 5.3 10*3/uL (ref 4.0–10.5)
nRBC: 0.4 % — ABNORMAL HIGH (ref 0.0–0.2)

## 2019-02-01 LAB — PREPARE RBC (CROSSMATCH)

## 2019-02-01 LAB — POC OCCULT BLOOD, ED: Fecal Occult Bld: POSITIVE — AB

## 2019-02-01 LAB — GLUCOSE, CAPILLARY: Glucose-Capillary: 119 mg/dL — ABNORMAL HIGH (ref 70–99)

## 2019-02-01 MED ORDER — PANTOPRAZOLE SODIUM 40 MG IV SOLR: 40.0000 mg | Freq: Two times a day (BID) | INTRAVENOUS | Status: DC

## 2019-02-01 MED ORDER — SODIUM CHLORIDE 0.9 % IV SOLN
80.0000 mg | Freq: Once | INTRAVENOUS | Status: AC
  Administered 2019-02-02: 80 mg via INTRAVENOUS
  Filled 2019-02-01 (×2): qty 80

## 2019-02-01 MED ORDER — ONDANSETRON HCL 4 MG/2ML IJ SOLN: 4.0000 mg | Freq: Four times a day (QID) | INTRAMUSCULAR | Status: DC | PRN

## 2019-02-01 MED ORDER — ALLOPURINOL 100 MG PO TABS
100.0000 mg | ORAL_TABLET | Freq: Every day | ORAL | Status: DC
  Administered 2019-02-02 – 2019-02-05 (×4): 100 mg via ORAL
  Filled 2019-02-01 (×4): qty 1

## 2019-02-01 MED ORDER — PROTHROMBIN COMPLEX CONC HUMAN 500 UNITS IV KIT: 50.0000 [IU]/kg | PACK | Status: DC

## 2019-02-01 MED ORDER — PRAVASTATIN SODIUM 20 MG PO TABS: 80.0000 mg | ORAL_TABLET | Freq: Every day | ORAL | Status: DC

## 2019-02-01 MED ORDER — ACETAMINOPHEN 325 MG PO TABS: 650.0000 mg | ORAL_TABLET | Freq: Four times a day (QID) | ORAL | Status: DC | PRN

## 2019-02-01 MED ORDER — ALBUTEROL SULFATE (2.5 MG/3ML) 0.083% IN NEBU: 2.5000 mg | INHALATION_SOLUTION | Freq: Four times a day (QID) | RESPIRATORY_TRACT | Status: DC | PRN

## 2019-02-01 MED ORDER — METOPROLOL TARTRATE 25 MG PO TABS
25.0000 mg | ORAL_TABLET | Freq: Two times a day (BID) | ORAL | Status: DC
  Administered 2019-02-02 – 2019-02-05 (×7): 25 mg via ORAL
  Filled 2019-02-01 (×7): qty 1

## 2019-02-01 MED ORDER — ACETAMINOPHEN 650 MG RE SUPP: 650.0000 mg | Freq: Four times a day (QID) | RECTAL | Status: DC | PRN

## 2019-02-01 MED ORDER — PRAVASTATIN SODIUM 40 MG PO TABS
80.0000 mg | ORAL_TABLET | Freq: Every day | ORAL | Status: DC
  Administered 2019-02-02 – 2019-02-04 (×3): 80 mg via ORAL
  Filled 2019-02-01: qty 2
  Filled 2019-02-01: qty 4
  Filled 2019-02-01: qty 2

## 2019-02-01 MED ORDER — FAMOTIDINE 20 MG PO TABS: 20.0000 mg | ORAL_TABLET | Freq: Every day | ORAL | Status: DC

## 2019-02-01 MED ORDER — EMPTY CONTAINERS FLEXIBLE MISC
900.0000 mg | Freq: Once | Status: AC
  Administered 2019-02-01: 900 mg via INTRAVENOUS
  Filled 2019-02-01: qty 90

## 2019-02-01 MED ORDER — SODIUM CHLORIDE 0.9 % IV SOLN
8.0000 mg/h | INTRAVENOUS | Status: DC
  Administered 2019-02-02 – 2019-02-03 (×4): 8 mg/h via INTRAVENOUS
  Filled 2019-02-01 (×9): qty 80

## 2019-02-01 MED ORDER — INSULIN ASPART 100 UNIT/ML ~~LOC~~ SOLN: 0.0000 [IU] | SUBCUTANEOUS | Status: DC

## 2019-02-01 MED ORDER — ONDANSETRON HCL 4 MG PO TABS: 4.0000 mg | ORAL_TABLET | Freq: Four times a day (QID) | ORAL | Status: DC | PRN

## 2019-02-01 MED ORDER — SODIUM CHLORIDE 0.9% IV SOLUTION
Freq: Once | INTRAVENOUS | Status: AC
  Administered 2019-02-02: 06:00:00 via INTRAVENOUS

## 2019-02-01 MED ORDER — INSULIN GLARGINE 100 UNIT/ML ~~LOC~~ SOLN: 5.0000 [IU] | Freq: Every day | SUBCUTANEOUS | Status: DC

## 2019-02-01 MED ORDER — MOMETASONE FURO-FORMOTEROL FUM 100-5 MCG/ACT IN AERO
2.0000 | INHALATION_SPRAY | Freq: Two times a day (BID) | RESPIRATORY_TRACT | Status: DC
  Administered 2019-02-02 – 2019-02-05 (×6): 2 via RESPIRATORY_TRACT
  Filled 2019-02-01 (×2): qty 8.8

## 2019-02-01 NOTE — ED Notes (Signed)
CRITICAL VALUE STICKER  CRITICAL VALUE: Hgb 5.6  RECEIVER (on-site recipient of call): Maylon Cos T RN  DATE & TIME NOTIFIED: 02/01/19 812p  MESSENGER (representative from lab): Ubaldo Glassing  MD NOTIFIED: Wilson Singer  TIME OF NOTIFICATION: 812 p  RESPONSE: see orders

## 2019-02-01 NOTE — ED Triage Notes (Signed)
Pt reports she was sent to the ED by her Urologist d/t possible bleeding issue.  She endorses dizziness and gait problem.  She is hypotensive with dark stools for the past 4 days.  She denies abd pain or n/v.

## 2019-02-01 NOTE — H&P (Signed)
History and Physical    Kathleen [name] CVK:[id number] DOB: Jul 23, 1946 DOA: 02/01/2019  PCP: Nolene Ebbs, MD  Patient coming from: Home  I have personally briefly reviewed patient's old medical records in [place]  Chief Complaint: GI bleed  HPI: Kathleen [name] is a 73 y.o. female with medical history significant of DM2, HTN, CKD stage 4, A.Flutter on eliquis, recurrent GI bleeds over the past year due to AVMs.  Patient presents to the ED today with c/o dark stools for past 4 days, dizziness, difficulty walking.  No abd pain, no N/V.  Went to urologists office who sent her in to ED.   ED Course: HGB 5.6.  Hemoccult positive.  Creat up to 3.8, had been trending down at time of discharge in Jan.  Baseline 2.5 it looks like?  SBP low 100s.   Review of Systems: As per HPI otherwise 10 point review of systems negative.   Past Medical History:  Diagnosis Date  . Arthritis   . Blood transfusion    no side affects  . CHF (congestive heart failure) ([place])   . Coronary artery disease    Cypher stent to the RCA in 2003  . Diabetes mellitus    Borderline  . GERD (gastroesophageal reflux disease)   . Hypertension   . Shortness of breath   . Sleep apnea     Past Surgical History:  Procedure Laterality Date  . BIOPSY  06/06/2018   Procedure: BIOPSY;  Surgeon: Otis Brace, MD;  [facility]: [facility];  Service: Gastroenterology;;  . CARDIOVERSION N/A 03/17/2015   Procedure: CARDIOVERSION;  Surgeon: Sueanne Margarita, MD;  [facility];  Service: Cardiovascular;  Laterality: N/A;  . CHOLECYSTECTOMY    . CORONARY ANGIOPLASTY WITH STENT PLACEMENT    . ENTEROSCOPY N/A 06/28/2018   Procedure: ENTEROSCOPY;  Surgeon: Carol Ada, MD;  [facility];  Service: Endoscopy;  Laterality: N/A;  . ENTEROSCOPY N/A 09/12/2018   Procedure: ENTEROSCOPY;  Surgeon: Carol Ada, MD;  [facility];  Service: Endoscopy;  Laterality: N/A;  . ENTEROSCOPY N/A  12/21/2018   Procedure: ENTEROSCOPY;  Surgeon: Carol Ada, MD;  [facility];  Service: Endoscopy;  Laterality: N/A;  . ESOPHAGOGASTRODUODENOSCOPY (EGD) WITH PROPOFOL N/A 06/06/2018   Procedure: ESOPHAGOGASTRODUODENOSCOPY (EGD) WITH PROPOFOL;  Surgeon: Otis Brace, MD;  [facility];  Service: Gastroenterology;  Laterality: N/A;  . GIVENS CAPSULE STUDY N/A 06/25/2018   Procedure: GIVENS CAPSULE STUDY;  Surgeon: Carol Ada, MD;  [facility];  Service: Endoscopy;  Laterality: N/A;  . HOT HEMOSTASIS N/A 06/06/2018   Procedure: HOT HEMOSTASIS (ARGON PLASMA COAGULATION/BICAP);  Surgeon: Otis Brace, MD;  Location[facility];  Service: Gastroenterology;  Laterality: N/A;  . HOT HEMOSTASIS N/A 06/28/2018   Procedure: HOT HEMOSTASIS (ARGON PLASMA COAGULATION/BICAP);  Surgeon: Carol Ada, MD;  Location: [facility];  Service: Endoscopy;  Laterality: N/A;  . HOT HEMOSTASIS N/A 12/21/2018   Procedure: HOT HEMOSTASIS (ARGON PLASMA COAGULATION/BICAP);  Surgeon: Carol Ada, MD;  Location: [facility];  Service: Endoscopy;  Laterality: N/A;  . TOTAL KNEE ARTHROPLASTY       reports that she quit smoking about 5 years ago. Her smoking use included cigarettes. She has a 9.00 pack-year smoking history. She has never used smokeless tobacco. She reports that she does not drink alcohol or use drugs.  Allergies  Allergen Reactions  . Penicillins Hives and Shortness Of Breath    Tolerates Ceftin, Ceftriaxone, and Cefepime Has patient had a PCN reaction causing  immediate rash, facial/tongue/throat swelling, SOB or lightheadedness with hypotension: Y Has patient had a PCN reaction causing severe rash involving mucus membranes or skin necrosis: Y Has patient had a PCN reaction that required hospitalization: Y Has patient had a PCN reaction occurring within the last 10 years: N     Family History  Problem Relation Age of Onset  . Hypertension Father   . Heart  disease Father   . Gout Father   . Arthritis Father   . Heart attack Father   . Heart attack Brother   . Hypertension Brother   . Hypertension Sister   . Stroke Neg Hx      Prior to Admission medications   Medication Sig Start Date End Date Taking? Authorizing Provider  albuterol (PROVENTIL HFA;VENTOLIN HFA) 108 (90 Base) MCG/ACT inhaler Inhale 1-2 puffs into the lungs every 4 (four) hours as needed for wheezing or shortness of breath. 12/06/17  Yes Mariel Aloe, MD  albuterol (PROVENTIL) (2.5 MG/3ML) 0.083% nebulizer solution Take 2.5 mg by nebulization every 6 (six) hours as needed for wheezing or shortness of breath.   Yes [provider]  allopurinol (ZYLOPRIM) 100 MG tablet Take 100 mg by mouth daily.   Yes [provider]  apixaban (ELIQUIS) 5 MG TABS tablet Take 1 tablet (5 mg total) by mouth 2 (two) times daily. Please resume on 12/25/2018 12/25/18  Yes Bonnielee Haff, MD  colchicine 0.6 MG tablet Take 0.6 mg by mouth daily as needed (gout).   Yes [provider]  cyclobenzaprine (FLEXERIL) 10 MG tablet Take 10 mg by mouth 2 (two) times daily as needed for muscle spasms.    Yes [provider]  famotidine (PEPCID) 20 MG tablet Take 20 mg by mouth at bedtime.  02/04/18  Yes [provider]  furosemide (LASIX) 80 MG tablet Take 0.5 tablets (40 mg total) by mouth 2 (two) times daily. Resume only on 12/26/2018 Patient taking differently: Take 40-80 mg by mouth as directed. 80 MG in AM and 40 MG in PM 12/26/18  Yes Bonnielee Haff, MD  isosorbide-hydrALAZINE (BIDIL) 20-37.5 MG tablet Take 1 tablet by mouth 3 (three) times daily. 06/10/18  Yes Eugenie Filler, MD  LANTUS SOLOSTAR 100 UNIT/ML Solostar Pen Inject 10 Units into the skin daily. Please take only if your blood glucose levels are greater than 180. 12/22/18  Yes Bonnielee Haff, MD  metoprolol tartrate (LOPRESSOR) 50 MG tablet Take 1 tablet (50 mg total) by mouth 2 (two) times  daily. Patient taking differently: Take 25 mg by mouth 2 (two) times daily.  06/29/18  Yes Lavina Hamman, MD  pantoprazole (PROTONIX) 40 MG tablet Take 1 tablet (40 mg total) by mouth daily. 06/07/18 06/07/19 Yes Eugenie Filler, MD  pravastatin (PRAVACHOL) 80 MG tablet Take 80 mg by mouth daily.   Yes [provider]  SYMBICORT 80-4.5 MCG/ACT inhaler Inhale 2 puffs into the lungs 2 (two) times daily. 02/02/18  Yes [provider]    Physical Exam: Vitals:   02/01/19 1811 02/01/19 1920 02/01/19 2018 02/01/19 2103  BP: (!) 114/53 (!) 121/58 (!) 104/55 113/71  Pulse: 91 89 66 81  Resp: 16 16 17 18   Temp: 98.2 F (36.8 C)     TempSrc: Oral     SpO2: 100% 100% 99% 100%    Constitutional: NAD, calm, comfortable Eyes: PERRL, lids and conjunctivae normal ENMT: Mucous membranes are moist. Posterior pharynx clear of any exudate or lesions.Normal dentition.  Neck: normal,  supple, no masses, no thyromegaly Respiratory: clear to auscultation bilaterally, no wheezing, no crackles. Normal respiratory effort. No accessory muscle use.  Cardiovascular: Regular rate and rhythm, no murmurs / rubs / gallops. No extremity edema. 2+ pedal pulses. No carotid bruits.  Abdomen: no tenderness, no masses palpated. No hepatosplenomegaly. Bowel sounds positive.  Musculoskeletal: no clubbing / cyanosis. No joint deformity upper and lower extremities. Good ROM, no contractures. Normal muscle tone.  Skin: no rashes, lesions, ulcers. No induration Neurologic: CN 2-12 grossly intact. Sensation intact, DTR normal. Strength 5/5 in all 4.  Psychiatric: Normal judgment and insight. Alert and oriented x 3. Normal mood.    Labs on Admission: I have personally reviewed following labs and imaging studies  CBC: Recent Labs  Lab 02/01/19 1923  WBC 5.3  HGB 5.6*  HCT 19.6*  MCV 78.4*  PLT 287   Basic Metabolic Panel: Recent Labs  Lab 02/01/19 1923  NA 139  K 3.7  CL 100  CO2 27  GLUCOSE  141*  BUN 92*  CREATININE 3.83*  CALCIUM 9.4   GFR: CrCl cannot be calculated (Unknown ideal weight.). Liver Function Tests: Recent Labs  Lab 02/01/19 1923  AST 23  ALT 14  ALKPHOS 114  BILITOT 1.8*  PROT 7.5  ALBUMIN 3.7   No results for input(s): LIPASE, AMYLASE in the last 168 hours. No results for input(s): AMMONIA in the last 168 hours. Coagulation Profile: No results for input(s): INR, PROTIME in the last 168 hours. Cardiac Enzymes: No results for input(s): CKTOTAL, CKMB, CKMBINDEX, TROPONINI in the last 168 hours. BNP (last 3 results) No results for input(s): PROBNP in the last 8760 hours. HbA1C: No results for input(s): HGBA1C in the last 72 hours. CBG: No results for input(s): GLUCAP in the last 168 hours. Lipid Profile: No results for input(s): CHOL, HDL, LDLCALC, TRIG, CHOLHDL, LDLDIRECT in the last 72 hours. Thyroid Function Tests: No results for input(s): TSH, T4TOTAL, FREET4, T3FREE, THYROIDAB in the last 72 hours. Anemia Panel: No results for input(s): VITAMINB12, FOLATE, FERRITIN, TIBC, IRON, RETICCTPCT in the last 72 hours. Urine analysis:    Component Value Date/Time   COLORURINE STRAW (A) 09/11/2018 2023   APPEARANCEUR CLEAR 09/11/2018 2023   LABSPEC 1.005 09/11/2018 2023   PHURINE 7.0 09/11/2018 2023   GLUCOSEU NEGATIVE 09/11/2018 2023   HGBUR MODERATE (A) 09/11/2018 2023   BILIRUBINUR NEGATIVE 09/11/2018 2023   KETONESUR NEGATIVE 09/11/2018 2023   PROTEINUR 30 (A) 09/11/2018 2023   UROBILINOGEN 1.0 08/15/2015 0915   NITRITE NEGATIVE 09/11/2018 2023   LEUKOCYTESUR NEGATIVE 09/11/2018 2023    Radiological Exams on Admission: No results found.  EKG: Independently reviewed.  Assessment/Plan Principal Problem:   Anemia due to GI blood loss Active Problems:   Essential hypertension   Diabetes mellitus type 2, controlled (HCC)   CKD (chronic kidney disease) stage 4, GFR 15-29 ml/min (HCC)   Chronic diastolic CHF (congestive heart failure)  (HCC)   Atrial fibrillation, chronic   Acute on chronic renal failure ([place])    1. Anemia due to GI blood loss - probably bleeding AVMs again 1. Stop Eliquis 2. Transfuse 3u PRBC 3. Repeat CBC in AM 4. EDP calling Dr. Benson [place], call in AM unless he notes that he was able to speak with Dr. hung 5. Clear liquid diet for now, NPO after MN 6. PPI GTT 2. AKI on CKD stage 4 - 1. Likely due to GI bleed 2. Hold lasix 3. IVF: 3u PRBC transfusion 4. Repeat BMP in  AM 3. HTN - 1. Holding Bidil 4. A.Fib - 1. Continue metoprolol in AM (will hold tonight's dose for the moment given borderline low BPs). 2. Holding eliquis 5. DM2 - 1. Holding lantus 2. Sensitive scale SSI Q4H  DVT prophylaxis: SCDs Code Status: Full Family Communication: Family at bedside Disposition Plan: Home after admit Consults called: EDP calling Dr. Benson Norway Admission status: Admit to inpatient  Severity of Illness: The appropriate patient status for this patient is INPATIENT. Inpatient status is judged to be reasonable and necessary in order to provide the required intensity of service to ensure the patient's safety. The patient's presenting symptoms, physical exam findings, and initial radiographic and laboratory data in the context of their chronic comorbidities is felt to place them at high risk for further clinical deterioration. Furthermore, it is not anticipated that the patient will be medically stable for discharge from the hospital within 2 midnights of admission. The following factors support the patient status of inpatient.   "           The patient's presenting symptoms include SOB, DOE. "           The initial radiographic and laboratory data are worrisome because of HGB 5.6, creat now up to 3.8 from baseline 2.5 "           The chronic co-morbidities include CKD stage 4, A.Flutter on Eliquis, HTN, GI AVMs.   * I certify that at the point of admission it is my clinical judgment that the patient will require  inpatient hospital care spanning beyond 2 midnights from the point of admission due to high intensity of service, high risk for further deterioration and high frequency of surveillance required.*  GARDNER, JARED M. DO [facility]  How to contact the [facility] Attending or Consulting provider [place] or covering provider during after hours [place], for this patient?  1. Check the care team in [facility] and look for a) attending/consulting [facility] provider listed and b) the [facility] team listed 2. Log into [email or web address]  Amion Physician Scheduling and messaging for groups and whole hospitals  On call and physician scheduling software for group practices, residents, hospitalists and other medical providers for call, clinic, rotation and shift schedules. OnCall [employer] is a hospital-wide system for scheduling doctors and paging doctors on call. EasyPlot is for scientific plotting and data analysis.  [email or web address]  and use [place]'s universal password to access. If you do not have the password, please contact the hospital operator.  3. Locate the [facility] provider you are looking for under [facility] and page to a number that you can be directly reached. 4. If you still have difficulty reaching the provider, please page the [facility] (Director on Call) for the Hospitalists listed on amion for assistance.  02/01/2019, 9:15 PM

## 2019-02-01 NOTE — ED Notes (Signed)
Blood consent signed and at bedside. Possible side effects and transfusion reaction signs and symptoms reviewed with patient.

## 2019-02-01 NOTE — ED Notes (Signed)
Attempted IV access x2. Pt requesting someone else try. RN made aware.

## 2019-02-02 ENCOUNTER — Other Ambulatory Visit: Payer: Self-pay

## 2019-02-02 DIAGNOSIS — K922 Gastrointestinal hemorrhage, unspecified: Secondary | ICD-10-CM

## 2019-02-02 DIAGNOSIS — N179 Acute kidney failure, unspecified: Secondary | ICD-10-CM

## 2019-02-02 LAB — CBC
HCT: 26.6 % — ABNORMAL LOW (ref 36.0–46.0)
HCT: 25.8 % — ABNORMAL LOW (ref 36.0–46.0)
Hemoglobin: 8.2 g/dL — ABNORMAL LOW (ref 12.0–15.0)
Hemoglobin: 7.8 g/dL — ABNORMAL LOW (ref 12.0–15.0)
MCH: 24.6 pg — ABNORMAL LOW (ref 26.0–34.0)
MCH: 24.4 pg — ABNORMAL LOW (ref 26.0–34.0)
MCHC: 30.8 g/dL (ref 30.0–36.0)
MCHC: 30.2 g/dL (ref 30.0–36.0)
MCV: 79.9 fL — ABNORMAL LOW (ref 80.0–100.0)
MCV: 80.6 fL (ref 80.0–100.0)
Platelets: 136 10*3/uL — ABNORMAL LOW (ref 150–400)
Platelets: 133 10*3/uL — ABNORMAL LOW (ref 150–400)
RBC: 3.33 MIL/uL — ABNORMAL LOW (ref 3.87–5.11)
RBC: 3.2 MIL/uL — ABNORMAL LOW (ref 3.87–5.11)
RDW: 22.2 % — ABNORMAL HIGH (ref 11.5–15.5)
RDW: 22.1 % — ABNORMAL HIGH (ref 11.5–15.5)
WBC: 6.2 10*3/uL (ref 4.0–10.5)
WBC: 6.6 10*3/uL (ref 4.0–10.5)
nRBC: 0 % (ref 0.0–0.2)
nRBC: 0.3 % — ABNORMAL HIGH (ref 0.0–0.2)

## 2019-02-02 LAB — BASIC METABOLIC PANEL
Anion gap: 13 (ref 5–15)
BUN: 82 mg/dL — ABNORMAL HIGH (ref 8–23)
CHLORIDE: 102 mmol/L (ref 98–111)
CO2: 26 mmol/L (ref 22–32)
CREATININE: 3.59 mg/dL — AB (ref 0.44–1.00)
Calcium: 9.3 mg/dL (ref 8.9–10.3)
GFR calc Af Amer: 14 mL/min — ABNORMAL LOW (ref >60)
GFR calc non Af Amer: 12 mL/min — ABNORMAL LOW (ref >60)
Glucose, Bld: 87 mg/dL (ref 70–99)
Potassium: 3.4 mmol/L — ABNORMAL LOW (ref 3.5–5.1)
Sodium: 141 mmol/L (ref 135–145)

## 2019-02-02 LAB — GLUCOSE, CAPILLARY
Glucose-Capillary: 104 mg/dL — ABNORMAL HIGH (ref 70–99)
Glucose-Capillary: 99 mg/dL (ref 70–99)
Glucose-Capillary: 85 mg/dL (ref 70–99)
Glucose-Capillary: 83 mg/dL (ref 70–99)
Glucose-Capillary: 75 mg/dL (ref 70–99)

## 2019-02-02 MED ORDER — SODIUM CHLORIDE 0.9 % IV SOLN
INTRAVENOUS | Status: DC
  Administered 2019-02-02 – 2019-02-03 (×2): via INTRAVENOUS

## 2019-02-02 NOTE — H&P (View-Only) (Signed)
Reason for Consult: GI bleeding Referring Physician: [facility]  Kathleen [name] HPI: This is a 73 year old female with a PMH of bleeding proximal small bowel AVMs, CAD s/p DES and aflutter on Eliquis, and CKD admitted for a recurrent GI bleed.  She was last evaluated in January and underwent a repeat enteroscopy with findings of a couple of nonbleeding AVMs, which were ablated with APC.  She represents with dark stools for the past week.  When she was in dialysis she reported having the dark stools and she was admitted to the hospital.  The patient states that her stools were dark, but not as dark as her June and July 2019 stools.  Her HGB on admission was 5.6 g/dL and she did complain of fatigue and some dizziness.  She was also noted to have a lower SBP on admission.  Past Medical History:  Diagnosis Date  . Arthritis   . Blood transfusion    no side affects  . CHF (congestive heart failure) ([place])   . Coronary artery disease    Cypher stent to the RCA in 2003  . Diabetes mellitus    Borderline  . GERD (gastroesophageal reflux disease)   . Hypertension   . Shortness of breath   . Sleep apnea     Past Surgical History:  Procedure Laterality Date  . BIOPSY  06/06/2018   Procedure: BIOPSY;  Surgeon: Otis Brace, MD;  [facility];  Service: Gastroenterology;;  . CARDIOVERSION N/A 03/17/2015   Procedure: CARDIOVERSION;  Surgeon: Sueanne Margarita, MD;  [facility];  Service: Cardiovascular;  Laterality: N/A;  . CHOLECYSTECTOMY    . CORONARY ANGIOPLASTY WITH STENT PLACEMENT    . ENTEROSCOPY N/A 06/28/2018   Procedure: ENTEROSCOPY;  Surgeon: Carol Ada, MD;  [facility];  Service: Endoscopy;  Laterality: N/A;  . ENTEROSCOPY N/A 09/12/2018   Procedure: ENTEROSCOPY;  Surgeon: Carol Ada, MD;  [facility];  Service: Endoscopy;  Laterality: N/A;  . ENTEROSCOPY N/A 12/21/2018   Procedure: ENTEROSCOPY;  Surgeon: Carol Ada, MD;   [facility];  Service: Endoscopy;  Laterality: N/A;  . ESOPHAGOGASTRODUODENOSCOPY (EGD) WITH PROPOFOL N/A 06/06/2018   Procedure: ESOPHAGOGASTRODUODENOSCOPY (EGD) WITH PROPOFOL;  Surgeon: Otis Brace, MD;  [facility];  Service: Gastroenterology;  Laterality: N/A;  . GIVENS CAPSULE STUDY N/A 06/25/2018   Procedure: GIVENS CAPSULE STUDY;  Surgeon: Carol Ada, MD;  [facility];  Service: Endoscopy;  Laterality: N/A;  . HOT HEMOSTASIS N/A 06/06/2018   Procedure: HOT HEMOSTASIS (ARGON PLASMA COAGULATION/BICAP);  Surgeon: Otis Brace, MD;  Location[facility];  Service: Gastroenterology;  Laterality: N/A;  . HOT HEMOSTASIS N/A 06/28/2018   Procedure: HOT HEMOSTASIS (ARGON PLASMA COAGULATION/BICAP);  Surgeon: Carol Ada, MD;  Location[facility];  Service: Endoscopy;  Laterality: N/A;  . HOT HEMOSTASIS N/A 12/21/2018   Procedure: HOT HEMOSTASIS (ARGON PLASMA COAGULATION/BICAP);  Surgeon: Carol Ada, MD;  Location[facility];  Service: Endoscopy;  Laterality: N/A;  . TOTAL KNEE ARTHROPLASTY      Family History  Problem Relation Age of Onset  . Hypertension Father   . Heart disease Father   . Gout Father   . Arthritis Father   . Heart attack Father   . Heart attack Brother   . Hypertension Brother   . Hypertension Sister   . Stroke Neg Hx     Social History:  reports that she quit smoking about 5 years ago. Her smoking use included cigarettes. She has a  9.00 pack-year smoking history. She has never used smokeless tobacco. She reports that she does not drink alcohol or use drugs.  Allergies:  Allergies  Allergen Reactions  . Penicillins Hives and Shortness Of Breath    Tolerates Ceftin, Ceftriaxone, and Cefepime Has patient had a PCN reaction causing immediate rash, facial/tongue/throat swelling, SOB or lightheadedness with hypotension: Y Has patient had a PCN reaction causing severe rash involving mucus membranes or skin necrosis:  Y Has patient had a PCN reaction that required hospitalization: Y Has patient had a PCN reaction occurring within the last 10 years: N     Medications:  Scheduled: . allopurinol  100 mg Oral Daily  . insulin aspart  0-9 Units Subcutaneous Q4H  . metoprolol tartrate  25 mg Oral BID  . mometasone-formoterol  2 puff Inhalation BID  . [START ON 02/05/2019] pantoprazole  40 mg Intravenous Q12H  . pravastatin  80 mg Oral q1800   Continuous: . pantoprozole (PROTONIX) infusion      Results for orders placed or performed during the hospital encounter of 02/01/19 (from the past 24 hour(s))  Comprehensive metabolic panel     Status: Abnormal   Collection Time: 02/01/19  7:23 PM  Result Value Ref Range   Sodium 139 135 - 145 mmol/L   Potassium 3.7 3.5 - 5.1 mmol/L   Chloride 100 98 - 111 mmol/L   CO2 27 22 - 32 mmol/L   Glucose, Bld 141 (H) 70 - 99 mg/dL   BUN 92 (H) 8 - 23 mg/dL   Creatinine, Ser 3.83 (H) 0.44 - 1.00 mg/dL   Calcium 9.4 8.9 - 10.3 mg/dL   Total Protein 7.5 6.5 - 8.1 g/dL   Albumin 3.7 3.5 - 5.0 g/dL   AST 23 15 - 41 U/L   ALT 14 0 - 44 U/L   Alkaline Phosphatase 114 38 - 126 U/L   Total Bilirubin 1.8 (H) 0.3 - 1.2 mg/dL   GFR calc non Af Amer 11 (L) >60 mL/min   GFR calc Af Amer 13 (L) >60 mL/min   Anion gap 12 5 - 15  CBC     Status: Abnormal   Collection Time: 02/01/19  7:23 PM  Result Value Ref Range   WBC 5.3 4.0 - 10.5 K/uL   RBC 2.50 (L) 3.87 - 5.11 MIL/uL   Hemoglobin 5.6 (LL) 12.0 - 15.0 g/dL   HCT 19.6 (L) 36.0 - 46.0 %   MCV 78.4 (L) 80.0 - 100.0 fL   MCH 22.4 (L) 26.0 - 34.0 pg   MCHC 28.6 (L) 30.0 - 36.0 g/dL   RDW 27.2 (H) 11.5 - 15.5 %   Platelets 166 150 - 400 K/uL   nRBC 0.4 (H) 0.0 - 0.2 %  Type and screen [place]     Status: None (Preliminary result)   Collection Time: 02/01/19  7:53 PM  Result Value Ref Range   ABO/RH(D) O POS    Antibody Screen NEG    Sample Expiration 02/04/2019    Unit Number [id number]     Blood Component Type RED CELLS,LR    Unit division 00    Status of Unit ISSUED    Transfusion Status OK TO TRANSFUSE    Crossmatch Result Compatible    Unit Number [id number]    Blood Component Type RBC LR PHER1    Unit division 00    Status of Unit ISSUED    Transfusion Status OK TO TRANSFUSE    Crossmatch Result  Compatible    Unit Number [id number]    Blood Component Type RED CELLS,LR    Unit division 00    Status of Unit ISSUED    Transfusion Status OK TO TRANSFUSE    Crossmatch Result      Compatible Performed at [facility] [address], [place], [place] [address]   POC occult blood, ED     Status: Abnormal   Collection Time: 02/01/19  8:22 PM  Result Value Ref Range   Fecal Occult Bld POSITIVE (A) NEGATIVE  Prepare RBC     Status: None   Collection Time: 02/01/19  8:28 PM  Result Value Ref Range   Order Confirmation      ORDER PROCESSED BY BLOOD BANK Performed at [facility], [place] [address], [place], [place] [address]   CBG monitoring, ED     Status: Abnormal   Collection Time: 02/01/19  9:34 PM  Result Value Ref Range   Glucose-Capillary 113 (H) 70 - 99 mg/dL  Glucose, capillary     Status: Abnormal   Collection Time: 02/01/19 11:25 PM  Result Value Ref Range   Glucose-Capillary 119 (H) 70 - 99 mg/dL  Glucose, capillary     Status: None   Collection Time: 02/02/19  7:22 AM  Result Value Ref Range   Glucose-Capillary 99 70 - 99 mg/dL     No results found.  ROS:  As stated above in the HPI otherwise negative.  Blood pressure 113/66, pulse 69, temperature 98.2 F (36.8 C), temperature source Oral, resp. rate 17, height 5\' 4"  (1.626 m), weight 99.2 kg, SpO2 100 %.    PE: Gen: NAD, Alert and Oriented HEENT:  /AT, EOMI Neck: Supple, no LAD Lungs: CTA Bilaterally CV: RRR without M/G/R ABM: Soft, NTND, +BS Ext: No C/C/E  Assessment/Plan: 1) GI bleed. 2) Anemia. 3) Diverticula. 4) History of bleeding  AVMs.   She is feeling better with the blood transfusions.  Of note, they reported to me that the ER contacted me about using coag fact Xa recombinant [facility]), but I was never called.  I also did not authorize the use of this medication as I have no experience with the medication, but the family informed me that the ER stated I approved its use.  The rectal examination was significant for solid soft stool and it was dark.  There was no evidence of hematochezia to suggest a diverticular bleed.  Plan: 1) Enteroscopy tomorrow with Dr. Fuller Plan. 2) Continue with the blood transfusions.  Kathleen [name] D 02/02/2019, 11:27 AM

## 2019-02-02 NOTE — Consult Note (Signed)
Reason for Consult: GI bleeding Referring Physician: [facility]  [name] HPI: This is a 73 year old female with a PMH of bleeding proximal small bowel AVMs, CAD s/p DES and aflutter on Eliquis, and CKD admitted for a recurrent GI bleed.  She was last evaluated in January and underwent a repeat enteroscopy with findings of a couple of nonbleeding AVMs, which were ablated with APC.  She represents with dark stools for the past week.  When she was in dialysis she reported having the dark stools and she was admitted to the hospital.  The patient states that her stools were dark, but not as dark as her June and July 2019 stools.  Her HGB on admission was 5.6 g/dL and she did complain of fatigue and some dizziness.  She was also noted to have a lower SBP on admission.  Past Medical History:  Diagnosis Date  . Arthritis   . Blood transfusion    no side affects  . CHF (congestive heart failure) ([place])   . Coronary artery disease    Cypher stent to the RCA in 2003  . Diabetes mellitus    Borderline  . GERD (gastroesophageal reflux disease)   . Hypertension   . Shortness of breath   . Sleep apnea     Past Surgical History:  Procedure Laterality Date  . BIOPSY  06/06/2018   Procedure: BIOPSY;  Surgeon: Otis Brace, MD;  Location: [facility];  Service: Gastroenterology;;  . CARDIOVERSION N/A 03/17/2015   Procedure: CARDIOVERSION;  Surgeon: Sueanne Margarita, MD;  Location: [facility];  Service: Cardiovascular;  Laterality: N/A;  . CHOLECYSTECTOMY    . CORONARY ANGIOPLASTY WITH STENT PLACEMENT    . ENTEROSCOPY N/A 06/28/2018   Procedure: ENTEROSCOPY;  Surgeon: Carol Ada, MD;  Location: [facility];  Service: Endoscopy;  Laterality: N/A;  . ENTEROSCOPY N/A 09/12/2018   Procedure: ENTEROSCOPY;  Surgeon: Carol Ada, MD;  Location: [facility];  Service: Endoscopy;  Laterality: N/A;  . ENTEROSCOPY N/A 12/21/2018   Procedure: ENTEROSCOPY;  Surgeon: Carol Ada, MD;   Location: [facility];  Service: Endoscopy;  Laterality: N/A;  . ESOPHAGOGASTRODUODENOSCOPY (EGD) WITH PROPOFOL N/A 06/06/2018   Procedure: ESOPHAGOGASTRODUODENOSCOPY (EGD) WITH PROPOFOL;  Surgeon: Otis Brace, MD;  Location: [facility];  Service: Gastroenterology;  Laterality: N/A;  . GIVENS CAPSULE STUDY N/A 06/25/2018   Procedure: GIVENS CAPSULE STUDY;  Surgeon: Carol Ada, MD;  Location: [facility];  Service: Endoscopy;  Laterality: N/A;  . HOT HEMOSTASIS N/A 06/06/2018   Procedure: HOT HEMOSTASIS (ARGON PLASMA COAGULATION/BICAP);  Surgeon: Otis Brace, MD;  Location: [facility];  Service: Gastroenterology;  Laterality: N/A;  . HOT HEMOSTASIS N/A 06/28/2018   Procedure: HOT HEMOSTASIS (ARGON PLASMA COAGULATION/BICAP);  Surgeon: Carol Ada, MD;  Location: [facility];  Service: Endoscopy;  Laterality: N/A;  . HOT HEMOSTASIS N/A 12/21/2018   Procedure: HOT HEMOSTASIS (ARGON PLASMA COAGULATION/BICAP);  Surgeon: Carol Ada, MD;  Location: [facility];  Service: Endoscopy;  Laterality: N/A;  . TOTAL KNEE ARTHROPLASTY      Family History  Problem Relation Age of Onset  . Hypertension Father   . Heart disease Father   . Gout Father   . Arthritis Father   . Heart attack Father   . Heart attack Brother   . Hypertension Brother   . Hypertension Sister   . Stroke Neg Hx     Social History:  reports that she quit smoking about 5 years ago. Her smoking use included cigarettes. She has a  9.00 pack-year smoking history. She has never used smokeless tobacco. She reports that she does not drink alcohol or use drugs.  Allergies:  Allergies  Allergen Reactions  . Penicillins Hives and Shortness Of Breath    Tolerates Ceftin, Ceftriaxone, and Cefepime Has patient had a PCN reaction causing immediate rash, facial/tongue/throat swelling, SOB or lightheadedness with hypotension: Y Has patient had a PCN reaction causing severe rash involving mucus membranes or skin necrosis:  Y Has patient had a PCN reaction that required hospitalization: Y Has patient had a PCN reaction occurring within the last 10 years: N     Medications:  Scheduled: . allopurinol  100 mg Oral Daily  . insulin aspart  0-9 Units Subcutaneous Q4H  . metoprolol tartrate  25 mg Oral BID  . mometasone-formoterol  2 puff Inhalation BID  . [START ON 02/05/2019] pantoprazole  40 mg Intravenous Q12H  . pravastatin  80 mg Oral q1800   Continuous: . pantoprozole (PROTONIX) infusion      Results for orders placed or performed during the hospital encounter of 02/01/19 (from the past 24 hour(s))  Comprehensive metabolic panel     Status: Abnormal   Collection Time: 02/01/19  7:23 PM  Result Value Ref Range   Sodium 139 135 - 145 mmol/L   Potassium 3.7 3.5 - 5.1 mmol/L   Chloride 100 98 - 111 mmol/L   CO2 27 22 - 32 mmol/L   Glucose, Bld 141 (H) 70 - 99 mg/dL   BUN 92 (H) 8 - 23 mg/dL   Creatinine, Ser 3.83 (H) 0.44 - 1.00 mg/dL   Calcium 9.4 8.9 - 10.3 mg/dL   Total Protein 7.5 6.5 - 8.1 g/dL   Albumin 3.7 3.5 - 5.0 g/dL   AST 23 15 - 41 U/L   ALT 14 0 - 44 U/L   Alkaline Phosphatase 114 38 - 126 U/L   Total Bilirubin 1.8 (H) 0.3 - 1.2 mg/dL   GFR calc non Af Amer 11 (L) >60 mL/min   GFR calc Af Amer 13 (L) >60 mL/min   Anion gap 12 5 - 15  CBC     Status: Abnormal   Collection Time: 02/01/19  7:23 PM  Result Value Ref Range   WBC 5.3 4.0 - 10.5 K/uL   RBC 2.50 (L) 3.87 - 5.11 MIL/uL   Hemoglobin 5.6 (LL) 12.0 - 15.0 g/dL   HCT 19.6 (L) 36.0 - 46.0 %   MCV 78.4 (L) 80.0 - 100.0 fL   MCH 22.4 (L) 26.0 - 34.0 pg   MCHC 28.6 (L) 30.0 - 36.0 g/dL   RDW 27.2 (H) 11.5 - 15.5 %   Platelets 166 150 - 400 K/uL   nRBC 0.4 (H) 0.0 - 0.2 %  Type and screen [place]     Status: None (Preliminary result)   Collection Time: 02/01/19  7:53 PM  Result Value Ref Range   ABO/RH(D) O POS    Antibody Screen NEG    Sample Expiration 02/04/2019    Unit Number [id number]     Blood Component Type RED CELLS,LR    Unit division 00    Status of Unit ISSUED    Transfusion Status OK TO TRANSFUSE    Crossmatch Result Compatible    Unit Number [id number]    Blood Component Type RBC LR PHER1    Unit division 00    Status of Unit ISSUED    Transfusion Status OK TO TRANSFUSE    Crossmatch Result  Compatible    Unit Number [id number]    Blood Component Type RED CELLS,LR    Unit division 00    Status of Unit ISSUED    Transfusion Status OK TO TRANSFUSE    Crossmatch Result      Compatible Performed at [facility] [address] [place], [place] [address]   POC occult blood, ED     Status: Abnormal   Collection Time: 02/01/19  8:22 PM  Result Value Ref Range   Fecal Occult Bld POSITIVE (A) NEGATIVE  Prepare RBC     Status: None   Collection Time: 02/01/19  8:28 PM  Result Value Ref Range   Order Confirmation      ORDER PROCESSED BY BLOOD BANK Performed at [facility], [place] [address], [place], [place] [address]   CBG monitoring, ED     Status: Abnormal   Collection Time: 02/01/19  9:34 PM  Result Value Ref Range   Glucose-Capillary 113 (H) 70 - 99 mg/dL  Glucose, capillary     Status: Abnormal   Collection Time: 02/01/19 11:25 PM  Result Value Ref Range   Glucose-Capillary 119 (H) 70 - 99 mg/dL  Glucose, capillary     Status: None   Collection Time: 02/02/19  7:22 AM  Result Value Ref Range   Glucose-Capillary 99 70 - 99 mg/dL     No results found.  ROS:  As stated above in the HPI otherwise negative.  Blood pressure 113/66, pulse 69, temperature 98.2 F (36.8 C), temperature source Oral, resp. rate 17, height 5\' 4"  (1.626 m), weight 99.2 kg, SpO2 100 %.    PE: Gen: NAD, Alert and Oriented HEENT:  [place]/AT, EOMI Neck: Supple, no LAD Lungs: CTA Bilaterally CV: RRR without M/G/R ABM: Soft, NTND, +BS Ext: No C/C/E  Assessment/Plan: 1) GI bleed. 2) Anemia. 3) Diverticula. 4) History of bleeding  AVMs.   She is feeling better with the blood transfusions.  Of note, they reported to me that the ER contacted me about using coag fact Xa recombinant [facility]), but I was never called.  I also did not authorize the use of this medication as I have no experience with the medication, but the family informed me that the ER stated I approved its use.  The rectal examination was significant for solid soft stool and it was dark.  There was no evidence of hematochezia to suggest a diverticular bleed.  Plan: 1) Enteroscopy tomorrow with Dr. Fuller Plan. 2) Continue with the blood transfusions.  Magie Ciampa D 02/02/2019, 11:27 AM

## 2019-02-02 NOTE — Progress Notes (Signed)
  PROGRESS NOTE  Kathleen [name] GHW:[id number] DOB: Nov 27, 1946 DOA: 02/01/2019 PCP: Nolene Ebbs, MD  Brief History   73 year old woman PMH GI bleed secondary to AVM, diabetes mellitus type 2, CKD stage IV, atrial flutter on apixaban presented with dark stools at home, difficulty walking, dizziness.  Found to be profoundly anemic.  A & P  Acute blood loss anemia secondary to GI bleed.  History of AVM. --Eliquis stopped --Hemoglobin appropriately improved status post transfusion 3 units PRBC --PPI --I discussed case with Dr. Benson Norway (he was not contacted last night), appreciate his consultation  AKI superimposed on CKD stage IV --Likely secondary to GI bleed, volume depletion. --Lasix on hold --IV fluids.  Trend BMP.  Diabetes mellitus type 2 --Sliding scale insulin.  Resume Lantus when taking diet.  Atrial fibrillation --Appears stable.  Hold apixaban.  Continue metoprolol.  DVT prophylaxis: SCDs Code Status: Full Family Communication: daughter at bedside Disposition Plan: home    [name], MD  [facility] Direct contact: see [email or web address]  7PM-7AM contact night coverage as above 02/02/2019, 4:51 PM  LOS: 1 day   Consultants  . GI  Procedures  .   Antibiotics  .   Interval History/Subjective  Patient feels fine now.  Has noticed dark stools at home but no frank bleeding.  No chest pain.  Objective   Vitals:  Vitals:   02/02/19 1232 02/02/19 1403  BP: (!) 108/58 107/74  Pulse: 71 80  Resp: 18 19  Temp: 98.4 F (36.9 C) 98.7 F (37.1 C)  SpO2: 100% 100%    Exam:  Constitutional:  . Appears calm and comfortable Eyes:  . pupils and irises appear normal . Normal lids ENMT:  . grossly normal hearing  . Lips appear normal Respiratory:  . CTA bilaterally, no w/r/r.  . Respiratory effort normal.  Cardiovascular:  . RRR, no m/r/g . No LE extremity edema   Abdomen:  . Soft, nontender, nondistended Psychiatric:  . Mental  status o Mood, affect appropriate  I have personally reviewed the following:   Today's Data  . Hemoglobin 5.6 > 8.2  BUN improved, a 2, creatinine improved, 3.59  Lab Data  . Noted   Micro Data  .   Imaging  .   Cardiology Data  .   Other Data  .   Scheduled Meds: . allopurinol  100 mg Oral Daily  . insulin aspart  0-9 Units Subcutaneous Q4H  . metoprolol tartrate  25 mg Oral BID  . mometasone-formoterol  2 puff Inhalation BID  . [START ON 02/05/2019] pantoprazole  40 mg Intravenous Q12H  . pravastatin  80 mg Oral q1800   Continuous Infusions: . sodium chloride    . pantoprozole (PROTONIX) infusion 8 mg/hr (02/02/19 1258)    Principal Problem:   Anemia due to GI blood loss Active Problems:   Essential hypertension   Diabetes mellitus type 2, controlled (HCC)   CKD (chronic kidney disease) stage 4, GFR 15-29 ml/min (HCC)   Atrial fibrillation, chronic   AKI (acute kidney injury) ([place])   LOS: 1 day      Time spent 35 minutes, greater than 50% counseling and coordination of care.

## 2019-02-02 NOTE — Plan of Care (Signed)

## 2019-02-02 NOTE — ED Provider Notes (Signed)
[facility] Provider Note   CSN: [id number] Arrival date & time: 02/01/19  1757    History   Chief Complaint Chief Complaint  Patient presents with  . Hypotension  . dark stools    HPI Kathleen [name] is a 73 y.o. female.  HPI   72yF with lightheadedness and dark stools. Noticed black stools about a week ago. Began feeling tired and lightheaded a couple days ago. Wore with activity. Hx of proximal small bowel AVMs. She is also on eliquis. Denies any pain. No shortness of breath. No BRBPR. No n/v.  Past Medical History:  Diagnosis Date  . Arthritis   . Blood transfusion    no side affects  . CHF (congestive heart failure) ([place])   . Coronary artery disease    Cypher stent to the RCA in 2003  . Diabetes mellitus    Borderline  . GERD (gastroesophageal reflux disease)   . Hypertension   . Shortness of breath   . Sleep apnea     Patient Active Problem List   Diagnosis Date Noted  . Symptomatic anemia 12/19/2018  . GIB (gastrointestinal bleeding) 09/12/2018  . GI bleed 09/11/2018  . Malnutrition of moderate degree 06/26/2018  . Pressure injury of skin 06/26/2018  . Hypotension due to hypovolemia 06/22/2018  . Acute metabolic encephalopathy 56/43/3295  . Acute on chronic renal failure ([place]) 06/22/2018  . Transaminitis 06/22/2018  . Anemia due to GI blood loss 06/22/2018  . Epigastric pain 06/22/2018  . Decreased oral intake 06/22/2018  . Hypovolemic shock ([place]) 06/22/2018  . Duodenitis 06/07/2018  . Chronic gastritis 06/07/2018  . Abnormal LFTs   . Upper GI bleed   . Melena 06/04/2018  . Atrial fibrillation, chronic 06/04/2018  . SOB (shortness of breath) 03/08/2018  . SIRS (systemic inflammatory response syndrome) ([place]) 12/04/2017  . Hypothermia 12/04/2017  . Elevated LFTs 12/04/2017  . Sepsis ([place]) 12/04/2017  . Atrial flutter ([place]) 03/17/2015  . NSTEMI (non-ST elevated myocardial infarction) ([place])   . Chronic  diastolic CHF (congestive heart failure) ([place])   . Type 2 diabetes mellitus with hypoglycemia without coma ([place])   . Paroxysmal atrial fibrillation ([place]) 01/25/2015  . Non-ST elevation MI (NSTEMI) ([place]) 01/23/2015  . Diabetes mellitus type 2, controlled ([place]) 01/23/2015  . HLD (hyperlipidemia)   . CKD (chronic kidney disease) stage 4, GFR 15-29 ml/min ([facility])   . Acute congestive heart failure ([place]) 09/17/2014  . Asthma 03/26/2014  . GERD (gastroesophageal reflux disease) 03/26/2014  . Smoker 03/26/2014  . Postmenopausal bleeding 01/10/2014  . Carotid stenosis 01/04/2012  . Carpal tunnel syndrome on left 11/26/2011  . Transient ischemic attack on medication 11/02/2011  . Hyperlipidemia with target LDL less than 70 09/17/2008  . OBESITY 09/17/2008  . Essential hypertension 09/17/2008  . Coronary atherosclerosis 09/17/2008    Past Surgical History:  Procedure Laterality Date  . BIOPSY  06/06/2018   Procedure: BIOPSY;  Surgeon: Otis Brace, MD;  Location: [facility];  Service: Gastroenterology;;  . CARDIOVERSION N/A 03/17/2015   Procedure: CARDIOVERSION;  Surgeon: Sueanne Margarita, MD;  Location: [facility];  Service: Cardiovascular;  Laterality: N/A;  . CHOLECYSTECTOMY    . CORONARY ANGIOPLASTY WITH STENT PLACEMENT    . ENTEROSCOPY N/A 06/28/2018   Procedure: ENTEROSCOPY;  Surgeon: Carol Ada, MD;  Location: [facility];  Service: Endoscopy;  Laterality: N/A;  . ENTEROSCOPY N/A 09/12/2018   Procedure: ENTEROSCOPY;  Surgeon: Carol Ada, MD;  Location: [facility];  Service: Endoscopy;  Laterality: N/A;  . ENTEROSCOPY N/A 12/21/2018   Procedure: ENTEROSCOPY;  Surgeon: Carol Ada, MD;  Location: [facility];  Service: Endoscopy;  Laterality: N/A;  . ESOPHAGOGASTRODUODENOSCOPY (EGD) WITH PROPOFOL N/A 06/06/2018   Procedure: ESOPHAGOGASTRODUODENOSCOPY (EGD) WITH PROPOFOL;  Surgeon: Otis Brace, MD;  Location: [facility];  Service: Gastroenterology;  Laterality: N/A;  .  GIVENS CAPSULE STUDY N/A 06/25/2018   Procedure: GIVENS CAPSULE STUDY;  Surgeon: Carol Ada, MD;  Location: [facility];  Service: Endoscopy;  Laterality: N/A;  . HOT HEMOSTASIS N/A 06/06/2018   Procedure: HOT HEMOSTASIS (ARGON PLASMA COAGULATION/BICAP);  Surgeon: Otis Brace, MD;  Location: [facility];  Service: Gastroenterology;  Laterality: N/A;  . HOT HEMOSTASIS N/A 06/28/2018   Procedure: HOT HEMOSTASIS (ARGON PLASMA COAGULATION/BICAP);  Surgeon: Carol Ada, MD;  Location: [facility];  Service: Endoscopy;  Laterality: N/A;  . HOT HEMOSTASIS N/A 12/21/2018   Procedure: HOT HEMOSTASIS (ARGON PLASMA COAGULATION/BICAP);  Surgeon: Carol Ada, MD;  Location: [facility];  Service: Endoscopy;  Laterality: N/A;  . TOTAL KNEE ARTHROPLASTY       OB History    Gravida  9   Para  7   Term  5   Preterm  2   AB  2   Living  5     SAB  2   TAB  0   Ectopic  0   Multiple  0   Live Births  7            Home Medications    Prior to Admission medications   Medication Sig Start Date End Date Taking? Authorizing Provider  albuterol (PROVENTIL HFA;VENTOLIN HFA) 108 (90 Base) MCG/ACT inhaler Inhale 1-2 puffs into the lungs every 4 (four) hours as needed for wheezing or shortness of breath. 12/06/17  Yes Mariel Aloe, MD  albuterol (PROVENTIL) (2.5 MG/3ML) 0.083% nebulizer solution Take 2.5 mg by nebulization every 6 (six) hours as needed for wheezing or shortness of breath.   Yes [provider]  allopurinol (ZYLOPRIM) 100 MG tablet Take 100 mg by mouth daily.   Yes [provider]  apixaban (ELIQUIS) 5 MG TABS tablet Take 1 tablet (5 mg total) by mouth 2 (two) times daily. Please resume on 12/25/2018 12/25/18  Yes Bonnielee Haff, MD  colchicine 0.6 MG tablet Take 0.6 mg by mouth daily as needed (gout).   Yes [provider]  cyclobenzaprine (FLEXERIL) 10 MG tablet Take 10 mg by mouth 2 (two) times daily as needed for muscle spasms.    Yes  [provider]  famotidine (PEPCID) 20 MG tablet Take 20 mg by mouth at bedtime.  02/04/18  Yes [provider]  furosemide (LASIX) 80 MG tablet Take 0.5 tablets (40 mg total) by mouth 2 (two) times daily. Resume only on 12/26/2018 Patient taking differently: Take 40-80 mg by mouth as directed. 80 MG in AM and 40 MG in PM 12/26/18  Yes Bonnielee Haff, MD  isosorbide-hydrALAZINE (BIDIL) 20-37.5 MG tablet Take 1 tablet by mouth 3 (three) times daily. 06/10/18  Yes Eugenie Filler, MD  LANTUS SOLOSTAR 100 UNIT/ML Solostar Pen Inject 10 Units into the skin daily. Please take only if your blood glucose levels are greater than 180. 12/22/18  Yes Bonnielee Haff, MD  metoprolol tartrate (LOPRESSOR) 50 MG tablet Take 1 tablet (50 mg total) by mouth 2 (two) times daily. Patient taking differently: Take 25 mg by mouth 2 (two) times daily.  06/29/18  Yes Lavina Hamman, MD  pantoprazole ([place]) 40  MG tablet Take 1 tablet (40 mg total) by mouth daily. 06/07/18 06/07/19 Yes Eugenie Filler, MD  pravastatin (PRAVACHOL) 80 MG tablet Take 80 mg by mouth daily.   Yes [provider]  SYMBICORT 80-4.5 MCG/ACT inhaler Inhale 2 puffs into the lungs 2 (two) times daily. 02/02/18  Yes [provider]    Family History Family History  Problem Relation Age of Onset  . Hypertension Father   . Heart disease Father   . Gout Father   . Arthritis Father   . Heart attack Father   . Heart attack Brother   . Hypertension Brother   . Hypertension Sister   . Stroke Neg Hx     Social History Social History   Tobacco Use  . Smoking status: Former Smoker    Packs/day: 0.30    Years: 30.00    Pack years: 9.00    Types: Cigarettes    Last attempt to quit: 01/18/2014    Years since quitting: 5.0  . Smokeless tobacco: Never Used  . Tobacco comment: Smoking cessation requested   Substance Use Topics  . Alcohol use: No  . Drug use: No     Allergies   Penicillins   Review  of Systems Review of Systems  All systems reviewed and negative, other than as noted in HPI.  Physical Exam Updated Vital Signs BP (!) 98/59 (BP Location: Left Arm)   Pulse 72   Temp 98 F (36.7 C) (Oral)   Resp 20   SpO2 100%   Physical Exam Vitals signs and nursing note reviewed.  Constitutional:      General: She is not in acute distress.    Appearance: She is well-developed.  HENT:     Head: Normocephalic and atraumatic.  Eyes:     General:        Right eye: No discharge.        Left eye: No discharge.     Conjunctiva/sclera: Conjunctivae normal.  Neck:     Musculoskeletal: Neck supple.  Cardiovascular:     Rate and Rhythm: Normal rate and regular rhythm.     Heart sounds: Normal heart sounds. No murmur. No friction rub. No gallop.   Pulmonary:     Effort: Pulmonary effort is normal. No respiratory distress.     Breath sounds: Normal breath sounds.  Abdominal:     General: There is no distension.     Palpations: Abdomen is soft.     Tenderness: There is no abdominal tenderness.  Musculoskeletal:        General: No tenderness.  Skin:    General: Skin is warm and dry.  Neurological:     Mental Status: She is alert.  Psychiatric:        Behavior: Behavior normal.        Thought Content: Thought content normal.      ED Treatments / Results  Labs (all labs ordered are listed, but only abnormal results are displayed) Labs Reviewed  COMPREHENSIVE METABOLIC PANEL - Abnormal; Notable for the following components:      Result Value   Glucose, Bld 141 (*)    BUN 92 (*)    Creatinine, Ser 3.83 (*)    Total Bilirubin 1.8 (*)    GFR calc non Af Amer 11 (*)    GFR calc Af Amer 13 (*)    All other components within normal limits  CBC - Abnormal; Notable for the following components:   RBC 2.50 (*)  Hemoglobin 5.6 (*)    HCT 19.6 (*)    MCV 78.4 (*)    MCH 22.4 (*)    MCHC 28.6 (*)    RDW 27.2 (*)    nRBC 0.4 (*)    All other components within normal  limits  CBC - Abnormal; Notable for the following components:   RBC 3.33 (*)    Hemoglobin 8.2 (*)    HCT 26.6 (*)    MCV 79.9 (*)    MCH 24.6 (*)    RDW 22.2 (*)    Platelets 136 (*)    All other components within normal limits  BASIC METABOLIC PANEL - Abnormal; Notable for the following components:   Potassium 3.4 (*)    BUN 82 (*)    Creatinine, Ser 3.59 (*)    GFR calc non Af Amer 12 (*)    GFR calc Af Amer 14 (*)    All other components within normal limits  GLUCOSE, CAPILLARY - Abnormal; Notable for the following components:   Glucose-Capillary 119 (*)    All other components within normal limits  GLUCOSE, CAPILLARY - Abnormal; Notable for the following components:   Glucose-Capillary 104 (*)    All other components within normal limits  CBC - Abnormal; Notable for the following components:   RBC 3.20 (*)    Hemoglobin 7.8 (*)    HCT 25.8 (*)    MCH 24.4 (*)    RDW 22.1 (*)    Platelets 133 (*)    nRBC 0.3 (*)    All other components within normal limits  BASIC METABOLIC PANEL - Abnormal; Notable for the following components:   Potassium 3.3 (*)    BUN 75 (*)    Creatinine, Ser 3.42 (*)    GFR calc non Af Amer 13 (*)    GFR calc Af Amer 15 (*)    All other components within normal limits  CBC - Abnormal; Notable for the following components:   RBC 3.32 (*)    Hemoglobin 7.9 (*)    HCT 26.8 (*)    MCH 23.8 (*)    MCHC 29.5 (*)    RDW 22.4 (*)    Platelets 131 (*)    nRBC 0.3 (*)    All other components within normal limits  GLUCOSE, CAPILLARY - Abnormal; Notable for the following components:   Glucose-Capillary 66 (*)    All other components within normal limits  GLUCOSE, CAPILLARY - Abnormal; Notable for the following components:   Glucose-Capillary 108 (*)    All other components within normal limits  GLUCOSE, CAPILLARY - Abnormal; Notable for the following components:   Glucose-Capillary 143 (*)    All other components within normal limits  GLUCOSE,  CAPILLARY - Abnormal; Notable for the following components:   Glucose-Capillary 157 (*)    All other components within normal limits  BASIC METABOLIC PANEL - Abnormal; Notable for the following components:   Potassium 3.4 (*)    BUN 63 (*)    Creatinine, Ser 3.14 (*)    GFR calc non Af Amer 14 (*)    GFR calc Af Amer 16 (*)    All other components within normal limits  CBC - Abnormal; Notable for the following components:   RBC 3.51 (*)    Hemoglobin 8.4 (*)    HCT 28.9 (*)    MCH 23.9 (*)    MCHC 29.1 (*)    RDW 22.8 (*)    Platelets 136 (*)  nRBC 0.3 (*)    All other components within normal limits  GLUCOSE, CAPILLARY - Abnormal; Notable for the following components:   Glucose-Capillary 110 (*)    All other components within normal limits  GLUCOSE, CAPILLARY - Abnormal; Notable for the following components:   Glucose-Capillary 102 (*)    All other components within normal limits  GLUCOSE, CAPILLARY - Abnormal; Notable for the following components:   Glucose-Capillary 153 (*)    All other components within normal limits  GLUCOSE, CAPILLARY - Abnormal; Notable for the following components:   Glucose-Capillary 127 (*)    All other components within normal limits  GLUCOSE, CAPILLARY - Abnormal; Notable for the following components:   Glucose-Capillary 115 (*)    All other components within normal limits  GLUCOSE, CAPILLARY - Abnormal; Notable for the following components:   Glucose-Capillary 102 (*)    All other components within normal limits  POC OCCULT BLOOD, ED - Abnormal; Notable for the following components:   Fecal Occult Bld POSITIVE (*)    All other components within normal limits  CBG MONITORING, ED - Abnormal; Notable for the following components:   Glucose-Capillary 113 (*)    All other components within normal limits  GLUCOSE, CAPILLARY  GLUCOSE, CAPILLARY  GLUCOSE, CAPILLARY  GLUCOSE, CAPILLARY  GLUCOSE, CAPILLARY  GLUCOSE, CAPILLARY  GLUCOSE, CAPILLARY   GLUCOSE, CAPILLARY  GLUCOSE, CAPILLARY  GLUCOSE, CAPILLARY  TYPE AND SCREEN  PREPARE RBC (CROSSMATCH)    EKG None  Radiology No results found.  Procedures Procedures (including critical care time)  CRITICAL CARE Performed by: Virgel Manifold Total critical care time: 40 minutes Critical care time was exclusive of separately billable procedures and treating other patients. Critical care was necessary to treat or prevent imminent or life-threatening deterioration. Critical care was time spent personally by me on the following activities: development of treatment plan with patient and/or surrogate as well as nursing, discussions with consultants, evaluation of patient's response to treatment, examination of patient, obtaining history from patient or surrogate, ordering and performing treatments and interventions, ordering and review of laboratory studies, ordering and review of radiographic studies, pulse oximetry and re-evaluation of patient's condition.   Medications Ordered in ED Medications  0.9 %  sodium chloride infusion (Manually program via Guardrails IV Fluids) ( Intravenous New Bag/Given 02/02/19 0608)  coag fact Xa recombinant (ANDEXXA) low dose infusion 900 mg (900 mg Intravenous New Bag/Given 02/01/19 2146)  pantoprazole (PROTONIX) 80 mg in sodium chloride 0.9 % 100 mL IVPB (80 mg Intravenous New Bag/Given 02/02/19 0104)  dextrose 50 % solution 12.5 g (12.5 g Intravenous Given 02/03/19 0037)  dextrose 50 % solution 25 mL (25 mLs Intravenous Given 02/03/19 0750)  dicyclomine (BENTYL) capsule 10 mg (10 mg Oral Given 02/03/19 1249)  bisacodyl (DULCOLAX) suppository 10 mg (10 mg Rectal Given 02/05/19 1111)  senna (SENOKOT) tablet 8.6 mg (8.6 mg Oral Given 02/05/19 1029)     Initial Impression / Assessment and Plan / ED Course  I have reviewed the triage vital signs and the nursing notes.  Pertinent labs & imaging results that were available during my care of the patient were  reviewed by me and considered in my medical decision making (see chart for details).   72yF with melena. Hx of small bowel AVMs. She is on eliquis. Hemoglobin is 5.6 and she is symptomatic. Will reverse eliquis. Initially ordered kcentra but pharmacist feels andexxa would be more appropriate choice given the last ingestion. Transfuse PRBCs. PPI. GI consultation. Medicine admission.  Final Clinical Impressions(s) / ED Diagnoses   Final diagnoses:  Gastrointestinal hemorrhage, unspecified gastrointestinal hemorrhage type  Symptomatic anemia    ED Discharge Orders    None       Virgel Manifold, MD 02/06/19 [phone]

## 2019-02-03 ENCOUNTER — Inpatient Hospital Stay (HOSPITAL_COMMUNITY): Payer: Medicare Other | Admitting: Certified Registered Nurse Anesthetist

## 2019-02-03 ENCOUNTER — Encounter (HOSPITAL_COMMUNITY): Payer: Self-pay | Admitting: Anesthesiology

## 2019-02-03 ENCOUNTER — Encounter (HOSPITAL_COMMUNITY)
Admission: EM | Disposition: A | Discharge status: Home / Self Care | Payer: Self-pay | Source: Home / Self Care | Attending: Family Medicine

## 2019-02-03 DIAGNOSIS — K31811 Angiodysplasia of stomach and duodenum with bleeding: Secondary | ICD-10-CM

## 2019-02-03 DIAGNOSIS — K921 Melena: Secondary | ICD-10-CM

## 2019-02-03 DIAGNOSIS — K31819 Angiodysplasia of stomach and duodenum without bleeding: Secondary | ICD-10-CM

## 2019-02-03 HISTORY — PX: HOT HEMOSTASIS: SHX5433

## 2019-02-03 HISTORY — PX: ENTEROSCOPY: SHX5533

## 2019-02-03 LAB — GLUCOSE, CAPILLARY
GLUCOSE-CAPILLARY: 77 mg/dL (ref 70–99)
Glucose-Capillary: 108 mg/dL — ABNORMAL HIGH (ref 70–99)
Glucose-Capillary: 110 mg/dL — ABNORMAL HIGH (ref 70–99)
Glucose-Capillary: 143 mg/dL — ABNORMAL HIGH (ref 70–99)
Glucose-Capillary: 157 mg/dL — ABNORMAL HIGH (ref 70–99)
Glucose-Capillary: 66 mg/dL — ABNORMAL LOW (ref 70–99)
Glucose-Capillary: 74 mg/dL (ref 70–99)
Glucose-Capillary: 77 mg/dL (ref 70–99)
Glucose-Capillary: 92 mg/dL (ref 70–99)
Glucose-Capillary: 92 mg/dL (ref 70–99)

## 2019-02-03 LAB — BASIC METABOLIC PANEL
Anion gap: 11 (ref 5–15)
BUN: 75 mg/dL — ABNORMAL HIGH (ref 8–23)
CO2: 25 mmol/L (ref 22–32)
Calcium: 9.1 mg/dL (ref 8.9–10.3)
Chloride: 107 mmol/L (ref 98–111)
Creatinine, Ser: 3.42 mg/dL — ABNORMAL HIGH (ref 0.44–1.00)
GFR calc Af Amer: 15 mL/min — ABNORMAL LOW (ref >60)
GFR calc non Af Amer: 13 mL/min — ABNORMAL LOW (ref >60)
Glucose, Bld: 81 mg/dL (ref 70–99)
Potassium: 3.3 mmol/L — ABNORMAL LOW (ref 3.5–5.1)
Sodium: 143 mmol/L (ref 135–145)

## 2019-02-03 LAB — TYPE AND SCREEN
ABO/RH(D): O POS
Antibody Screen: NEGATIVE
Unit division: 0
Unit division: 0
Unit division: 0

## 2019-02-03 LAB — CBC
HCT: 26.8 % — ABNORMAL LOW (ref 36.0–46.0)
Hemoglobin: 7.9 g/dL — ABNORMAL LOW (ref 12.0–15.0)
MCH: 23.8 pg — ABNORMAL LOW (ref 26.0–34.0)
MCHC: 29.5 g/dL — ABNORMAL LOW (ref 30.0–36.0)
MCV: 80.7 fL (ref 80.0–100.0)
Platelets: 131 10*3/uL — ABNORMAL LOW (ref 150–400)
RBC: 3.32 MIL/uL — ABNORMAL LOW (ref 3.87–5.11)
RDW: 22.4 % — ABNORMAL HIGH (ref 11.5–15.5)
WBC: 6.4 10*3/uL (ref 4.0–10.5)
nRBC: 0.3 % — ABNORMAL HIGH (ref 0.0–0.2)

## 2019-02-03 LAB — BPAM RBC
Blood Product Expiration Date: 202003212359
Blood Product Expiration Date: 202003212359
Blood Product Expiration Date: 202003212359
ISSUE DATE / TIME: 202002210143
ISSUE DATE / TIME: 202002210558
ISSUE DATE / TIME: 202002210859
Unit Type and Rh: 5100
Unit Type and Rh: 5100
Unit Type and Rh: 5100

## 2019-02-03 SURGERY — ENTEROSCOPY
Anesthesia: Monitor Anesthesia Care

## 2019-02-03 MED ORDER — INSULIN ASPART 100 UNIT/ML ~~LOC~~ SOLN
0.0000 [IU] | Freq: Three times a day (TID) | SUBCUTANEOUS | Status: DC
  Administered 2019-02-03: 2 [IU] via SUBCUTANEOUS
  Administered 2019-02-03: 1 [IU] via SUBCUTANEOUS
  Administered 2019-02-04: 2 [IU] via SUBCUTANEOUS

## 2019-02-03 MED ORDER — DEXTROSE 50 % IV SOLN
INTRAVENOUS | Status: AC
  Filled 2019-02-03: qty 50

## 2019-02-03 MED ORDER — DEXTROSE 50 % IV SOLN
25.0000 mL | Freq: Once | INTRAVENOUS | Status: AC
  Administered 2019-02-03: 25 mL via INTRAVENOUS

## 2019-02-03 MED ORDER — SODIUM CHLORIDE 0.9 % IV SOLN
INTRAVENOUS | Status: DC
  Administered 2019-02-03: 08:00:00 via INTRAVENOUS

## 2019-02-03 MED ORDER — DICYCLOMINE HCL 10 MG PO CAPS
10.0000 mg | ORAL_CAPSULE | Freq: Once | ORAL | Status: AC
  Administered 2019-02-03: 10 mg via ORAL
  Filled 2019-02-03: qty 1

## 2019-02-03 MED ORDER — LACTATED RINGERS IV SOLN
INTRAVENOUS | Status: DC
  Administered 2019-02-03 – 2019-02-04 (×3): via INTRAVENOUS

## 2019-02-03 MED ORDER — DEXTROSE 50 % IV SOLN
12.5000 g | Freq: Once | INTRAVENOUS | Status: AC
  Administered 2019-02-03: 12.5 g via INTRAVENOUS
  Filled 2019-02-03: qty 50

## 2019-02-03 MED ORDER — PROPOFOL 500 MG/50ML IV EMUL
INTRAVENOUS | Status: DC | PRN
  Administered 2019-02-03: 100 ug/kg/min via INTRAVENOUS

## 2019-02-03 MED ORDER — PROPOFOL 10 MG/ML IV BOLUS
INTRAVENOUS | Status: DC | PRN
  Administered 2019-02-03 (×3): 30 mg via INTRAVENOUS
  Administered 2019-02-03: 20 mg via INTRAVENOUS

## 2019-02-03 MED ORDER — PROPOFOL 10 MG/ML IV BOLUS
INTRAVENOUS | Status: AC
  Filled 2019-02-03: qty 40

## 2019-02-03 NOTE — Anesthesia Preprocedure Evaluation (Addendum)
Anesthesia Evaluation  Patient identified by MRN, date of birth, ID band Patient awake    Reviewed: Allergy & Precautions, NPO status , Patient's Chart, lab work & pertinent test results  Airway Mallampati: I  TM Distance: >3 FB Neck ROM: Full    Dental  (+) Edentulous Upper, Edentulous Lower   Pulmonary asthma , sleep apnea , former smoker,     + decreased breath sounds      Cardiovascular hypertension, + CAD, + Past MI, + Cardiac Stents and +CHF  + dysrhythmias  Rhythm:Irregular Rate:Normal     Neuro/Psych negative neurological ROS  negative psych ROS   GI/Hepatic Neg liver ROS, GERD  ,  Endo/Other  diabetes  Renal/GU      Musculoskeletal  (+) Arthritis ,   Abdominal Normal abdominal exam  (+)   Peds  Hematology   Anesthesia Other Findings   Reproductive/Obstetrics negative OB ROS                            Anesthesia Physical Anesthesia Plan  ASA: III  Anesthesia Plan: MAC   Post-op Pain Management:    Induction: Intravenous  PONV Risk Score and Plan: 0 and Propofol infusion  Airway Management Planned: Simple Face Mask and Natural Airway  Additional Equipment:   Intra-op Plan:   Post-operative Plan:   Informed Consent: I have reviewed the patients History and Physical, chart, labs and discussed the procedure including the risks, benefits and alternatives for the proposed anesthesia with the patient or authorized representative who has indicated his/her understanding and acceptance.       Plan Discussed with: CRNA  Anesthesia Plan Comments:        Anesthesia Quick Evaluation

## 2019-02-03 NOTE — Op Note (Signed)
[facility] Patient Name: Kathleen [name] Procedure Date: 02/03/2019 MRN: [id number] Attending MD: Ladene Artist , MD Date of Birth: 05-23-1946 CSN: [id number] Age: 73 Admit Type: Inpatient Procedure:                Small bowel enteroscopy Indications:              Melena Providers:                Pricilla Riffle. Fuller Plan, MD, Elmer Ramp. Tilden Dome, RN,                            William Dalton, Technician Referring MD:             Carol Ada, MD Medicines:                Monitored Anesthesia Care Complications:            No immediate complications. Estimated Blood Loss:     Estimated blood loss: none. Procedure:                Pre-Anesthesia Assessment:                           - Prior to the procedure, a History and Physical                            was performed, and patient medications and                            allergies were reviewed. The patient's tolerance of                            previous anesthesia was also reviewed. The risks                            and benefits of the procedure and the sedation                            options and risks were discussed with the patient.                            All questions were answered, and informed consent                            was obtained. Prior Anticoagulants: The patient has                            taken Eliquis (apixaban), last dose was 2 days                            prior to procedure. ASA Grade Assessment: III - A                            patient with severe systemic disease. After  reviewing the risks and benefits, the patient was                            deemed in satisfactory condition to undergo the                            procedure.                           After obtaining informed consent, the endoscope was                            passed under direct vision. Throughout the                            procedure, the patient's blood pressure, pulse, and                             oxygen saturations were monitored continuously. The                            [id number]-[id number] ([id number] Olympus pediatric colonscope                            was introduced through the mouth and advanced to                            the proximal jejunum. The small bowel enteroscopy                            was accomplished without difficulty. The patient                            tolerated the procedure well. Scope In: Scope Out: Findings:      The examined esophagus was normal.      Two 4 mm non bleeding angiodysplastic lesions were found in the body of       the stomach. Coagulation for bleeding prevention using argon plasma was       successful.      The exam of the stomach was otherwise normal.      A single 3 mm angiodysplastic lesion with no bleeding was found in the       duodenal bulb. Coagulation for bleeding prevention using argon plasma       was successful.      There was no evidence of significant pathology in the second portion of       the duodenum, in the third portion of the duodenum and in the fourth       portion of the duodenum.      There was no evidence of significant pathology in the proximal jejunum. Impression:               - Normal esophagus.                           - Two non-bleeding angiodysplastic lesions in the  body of the stomach. Treated with argon plasma                            coagulation (APC).                           - A single non-bleeding angiodysplastic lesion in                            the duodenal bulb. Treated with argon plasma                            coagulation (APC).                           - Normal second portion of the duodenum, third                            portion of the duodenum and fourth portion of the                            duodenum.                           - The examined portion of the jejunum was normal.                           - No specimens  collected. Recommendation:           - Return patient to hospital ward for ongoing care.                           - Full liquid diet today.                           - Given AVMs with high likelihood of recurrent                            bleeding recommend primary service consider dose                            reduction of Eliquis to 2.5 mg po bid or an                            alternative OAC.                           - OK to resume anticoagulants in 2 days as                            indicated.                           - Avoid ASA/NSAIDs long term.                           -  GI follow up with Carol Ada, MD. Procedure Code(s):        --- Professional ---                           [id number], Small intestinal endoscopy, enteroscopy                            beyond second portion of duodenum, not including                            ileum; with control of bleeding (eg, injection,                            bipolar cautery, unipolar cautery, laser, heater                            probe, stapler, plasma coagulator) Diagnosis Code(s):        --- Professional ---                           K31.819, Angiodysplasia of stomach and duodenum                            without bleeding                           K92.1, Melena (includes Hematochezia) CPT copyright 2018 [employer]. All rights reserved. The codes documented in this report are preliminary and upon coder review may  be revised to meet current compliance requirements. Ladene Artist, MD 02/03/2019 8:46:16 AM This report has been signed electronically. Number of Addenda: 0

## 2019-02-03 NOTE — Progress Notes (Signed)
PROGRESS NOTE  Kathleen [name] MWU:[id number] DOB: Jan 21, 1946 DOA: 02/01/2019 PCP: Nolene Ebbs, MD  Brief History   73 year old woman PMH GI bleed secondary to AVM, diabetes mellitus type 2, CKD stage IV, atrial flutter on apixaban presented with dark stools at home, difficulty walking, dizziness.  Found to be profoundly anemic.  A & P  Acute blood loss anemia secondary to GI bleed, probably angiodysplastic lesions of the stomach and duodenal bulb.  History of AVM. --Apixaban stopped.  Bleeding appears to have stopped.  Hemoglobin remained stable status post 3 units PRBC earlier in hospitalization. --Status post small bowel enteroscopy today with findings as below, showing nonbleeding AVM which were treated with APC. --Full liquid diet per GI today --Continue PPI with transition to oral 2/24 --Can resume anticoagulation in 48 hours.  However, given recurrent bleeding, consider lower dose of apixaban of 2.5 mg twice daily or alternative therapy. --Likely home 2/24. Will discuss with cardiologist on Monday to discuss risk/benefit to guide ultimate decision  AKI superimposed on CKD stage IV.  Secondary to GI bleed, volume depletion. --Lasix on hold, renal function appears to be improving, trending towards probable baseline of 2.5-3.  Diabetes mellitus type 2 --One episode of hypoglycemia at midnight.  Fasting blood sugar 77. --Monitor CBG today, start Lantus once taking consistent diet.  Atrial fibrillation --Stable.  Continue metoprolol.  Hold anticoagulation.  DVT prophylaxis: SCDs Code Status: Full Family Communication: Daughter at bedside. Disposition Plan: home likely 2/24.   [name], MD  [facility] Direct contact: see [email or web address]  7PM-7AM contact night coverage as above 02/03/2019, 1:19 PM  LOS: 2 days   Consultants  . GI  Procedures  . Small bowel enteroscopy Impression:               - Normal esophagus.                           - Two non-bleeding  angiodysplastic lesions in the                            body of the stomach. Treated with argon plasma                            coagulation (APC).                           - A single non-bleeding angiodysplastic lesion in                            the duodenal bulb. Treated with argon plasma                            coagulation (APC).                           - Normal second portion of the duodenum, third                            portion of the duodenum and fourth portion of the  duodenum.                           - The examined portion of the jejunum was normal.                           - No specimens collected. Recommendation:           - Return patient to hospital ward for ongoing care.                           - Full liquid diet today.                           - Given AVMs with high likelihood of recurrent                            bleeding recommend primary service consider dose                            reduction of Eliquis to 2.5 mg po bid or an                            alternative OAC.                           - OK to resume anticoagulants in 2 days as                            indicated.                           - Avoid ASA/NSAIDs long term.                           - GI follow up with Carol Ada, MD.  Antibiotics  .   Interval History/Subjective  Feels fine today.  No complaints at this time.  Wonders if the "band" she has felt around her abdomen for the last several years may be related to stress.  RN later reported that the patient had some abdominal cramping after a bowel movement.  Objective   Vitals:  Vitals:   02/03/19 1143 02/03/19 1147  BP:    Pulse:    Resp:    Temp:    SpO2: 99% 99%    Exam:  Constitutional:   . Appears calm and comfortable Eyes:  . pupils and irises appear normal ENMT:  . grossly normal hearing  Respiratory:  . CTA bilaterally, no w/r/r.  . Respiratory effort normal.    Cardiovascular:  . Irregular, normal rate, no m/r/g . No LE extremity edema   Psychiatric:  . Mental status o Mood, affect appropriate  I have personally reviewed the following:   Today's Data   CBG stable  BUN slightly improved, 75.  Creatinine slightly improved, 3.42.  Hemoglobin stable 7.9.  Platelets stable at 131.  Lab Data  . Noted   Micro Data  .   Imaging  .   Cardiology Data  .   Other Data  .   Scheduled Meds: .  allopurinol  100 mg Oral Daily  . insulin aspart  0-9 Units Subcutaneous TID AC & HS  . metoprolol tartrate  25 mg Oral BID  . mometasone-formoterol  2 puff Inhalation BID  . [START ON 02/05/2019] pantoprazole  40 mg Intravenous Q12H  . pravastatin  80 mg Oral q1800   Continuous Infusions: . lactated ringers 125 mL/hr at 02/03/19 1038  . pantoprozole (PROTONIX) infusion 8 mg/hr (02/03/19 1253)    Principal Problem:   Anemia due to GI blood loss Active Problems:   Essential hypertension   Diabetes mellitus type 2, controlled (HCC)   CKD (chronic kidney disease) stage 4, GFR 15-29 ml/min (HCC)   Atrial fibrillation, chronic   AKI (acute kidney injury) ([place])   AVM (arteriovenous malformation) of duodenum, acquired   AVM (arteriovenous malformation) of stomach, acquired with hemorrhage   LOS: 2 days

## 2019-02-03 NOTE — Anesthesia Postprocedure Evaluation (Signed)
Anesthesia Post Note  Patient: NAVIL KOLE  Procedure(s) Performed: ENTEROSCOPY (N/A ) HOT HEMOSTASIS (ARGON PLASMA COAGULATION/BICAP) (N/A )     Patient location during evaluation: PACU Anesthesia Type: MAC Level of consciousness: awake and alert Pain management: pain level controlled Vital Signs Assessment: post-procedure vital signs reviewed and stable Respiratory status: spontaneous breathing, nonlabored ventilation, respiratory function stable and patient connected to nasal cannula oxygen Cardiovascular status: stable and blood pressure returned to baseline Postop Assessment: no apparent nausea or vomiting Anesthetic complications: no    Last Vitals:  Vitals:   02/03/19 0855 02/03/19 0900  BP: (!) 109/50 (!) 123/46  Pulse: 92 82  Resp: 20 17  Temp:    SpO2: 95% 98%    Last Pain:  Vitals:   02/03/19 0855  TempSrc:   PainSc: 0-No pain                 Effie Berkshire

## 2019-02-03 NOTE — Progress Notes (Signed)
Hypoglycemic Event  CBG: 66  Treatment: D50 25 mL (12.5 gm)  Symptoms: None  Follow-up CBG: Time: 0055 CBG Result: 108  Possible Reasons for Event: Inadequate meal intake  Comments/MD notified:    Kathleen [name]

## 2019-02-03 NOTE — Transfer of Care (Signed)
Immediate Anesthesia Transfer of Care Note  Patient: Kathleen [name]  Procedure(s) Performed: ENTEROSCOPY (N/A ) HOT HEMOSTASIS (ARGON PLASMA COAGULATION/BICAP) (N/A )  Patient Location: Endoscopy Unit  Anesthesia Type:MAC  Level of Consciousness: awake and patient cooperative  Airway & Oxygen Therapy: Patient Spontanous Breathing and Patient connected to nasal cannula oxygen  Post-op Assessment: Report given to RN and Post -op Vital signs reviewed and stable  Post vital signs: Reviewed and stable  Last Vitals:  Vitals Value Taken Time  BP    Temp    Pulse    Resp    SpO2      Last Pain:  Vitals:   02/03/19 0728  TempSrc: Oral  PainSc: 0-No pain      Patients Stated Pain Goal: 3 (50/09/38 1829)  Complications: No apparent anesthesia complications

## 2019-02-03 NOTE — Progress Notes (Signed)
CBG-74 this am on arrival to endo unit. Dr. Smith Robert made aware and order to give 25cc of 50%dextrose given. Will reassess.

## 2019-02-03 NOTE — Interval H&P Note (Signed)
History and Physical Interval Note:  02/03/2019 8:07 AM  Kathleen [name]  has presented today for surgery, with the diagnosis of GI bleed  The various methods of treatment have been discussed with the patient and family. After consideration of risks, benefits and other options for treatment, the patient has consented to  Procedure(s): ENTEROSCOPY (N/A) as a surgical intervention .  The patient's history has been reviewed, patient examined, no change in status, stable for surgery.  I have reviewed the patient's chart and labs.  Questions were answered to the patient's satisfaction.     Pricilla Riffle. Fuller Plan

## 2019-02-04 LAB — CBC
HCT: 28.9 % — ABNORMAL LOW (ref 36.0–46.0)
Hemoglobin: 8.4 g/dL — ABNORMAL LOW (ref 12.0–15.0)
MCH: 23.9 pg — AB (ref 26.0–34.0)
MCHC: 29.1 g/dL — ABNORMAL LOW (ref 30.0–36.0)
MCV: 82.3 fL (ref 80.0–100.0)
Platelets: 136 10*3/uL — ABNORMAL LOW (ref 150–400)
RBC: 3.51 MIL/uL — ABNORMAL LOW (ref 3.87–5.11)
RDW: 22.8 % — ABNORMAL HIGH (ref 11.5–15.5)
WBC: 8 10*3/uL (ref 4.0–10.5)
nRBC: 0.3 % — ABNORMAL HIGH (ref 0.0–0.2)

## 2019-02-04 LAB — BASIC METABOLIC PANEL
Anion gap: 10 (ref 5–15)
BUN: 63 mg/dL — ABNORMAL HIGH (ref 8–23)
CO2: 25 mmol/L (ref 22–32)
Calcium: 9.1 mg/dL (ref 8.9–10.3)
Chloride: 107 mmol/L (ref 98–111)
Creatinine, Ser: 3.14 mg/dL — ABNORMAL HIGH (ref 0.44–1.00)
GFR calc Af Amer: 16 mL/min — ABNORMAL LOW (ref >60)
GFR, EST NON AFRICAN AMERICAN: 14 mL/min — AB (ref >60)
GLUCOSE: 96 mg/dL (ref 70–99)
Potassium: 3.4 mmol/L — ABNORMAL LOW (ref 3.5–5.1)
Sodium: 142 mmol/L (ref 135–145)

## 2019-02-04 LAB — GLUCOSE, CAPILLARY
Glucose-Capillary: 102 mg/dL — ABNORMAL HIGH (ref 70–99)
Glucose-Capillary: 153 mg/dL — ABNORMAL HIGH (ref 70–99)
Glucose-Capillary: 127 mg/dL — ABNORMAL HIGH (ref 70–99)

## 2019-02-04 MED ORDER — PANTOPRAZOLE SODIUM 40 MG PO TBEC
40.0000 mg | DELAYED_RELEASE_TABLET | Freq: Every day | ORAL | Status: DC
  Administered 2019-02-04 – 2019-02-05 (×2): 40 mg via ORAL
  Filled 2019-02-04 (×2): qty 1

## 2019-02-04 NOTE — Progress Notes (Addendum)
    GI Progress Note Covering for Dr. Carol Ada    Subjective  No recurrent bleeding. Hungry.    Objective  Vital signs in last 24 hours: Temp:  [97.6 F (36.4 C)-98.9 F (37.2 C)] 98.2 F (36.8 C) (02/23 0542) Pulse Rate:  [75-98] 75 (02/23 0542) Resp:  [17-21] 18 (02/23 0542) BP: (92-123)/(38-78) 120/64 (02/23 0542) SpO2:  [95 %-100 %] 95 % (02/23 0542) Weight:  [104.8 kg] 104.8 kg (02/23 0542) Last BM Date: 02/03/19  General: Alert, well-developed, in NAD Heart:  Regular rate and rhythm; no murmurs Chest: Clear to ascultation bilaterally Abdomen:  Soft, nontender and nondistended. Normal bowel sounds, without guarding, and without rebound.   Extremities:  Without edema. Neurologic:  Alert and  oriented x4; grossly normal neurologically. Psych:  Alert and cooperative. Normal mood and affect.  Intake/Output from previous day: 02/22 0701 - 02/23 0700 In: 2369 [P.O.:240; I.V.:2129] Out: 1000 [Urine:1000] Intake/Output this shift: No intake/output data recorded.  Lab Results: Recent Labs    02/02/19 1855 02/03/19 0517 02/04/19 0527  WBC 6.6 6.4 8.0  HGB 7.8* 7.9* 8.4*  HCT 25.8* 26.8* 28.9*  PLT 133* 131* 136*   BMET Recent Labs    02/02/19 1404 02/03/19 0517 02/04/19 0527  NA 141 143 142  K 3.4* 3.3* 3.4*  CL 102 107 107  CO2 26 25 25   GLUCOSE 87 81 96  BUN 82* 75* 63*  CREATININE 3.59* 3.42* 3.14*  CALCIUM 9.3 9.1 9.1   LFT Recent Labs    02/01/19 1923  PROT 7.5  ALBUMIN 3.7  AST 23  ALT 14  ALKPHOS 114  BILITOT 1.8*      Assessment & Recommendations   1. UGI bleed secondary to AVMs. ABL anemia. No recurrent bleeding. Hgb 8.4. Advance diet and consider discharge. Protonix 40 mg po qam for 1 month for ulcerations at cautery sites. Avoid ASA/NSAIDs if possible. GI follow up in 1 month with Dr. Carol Ada.   2 Anticoagulation. OK to resume tomorrow as indicated. Consider lower dose Eliquis, 2.5 mg po bid, or another OAC to help reduce the  risk of recurrent GI bleeding. Decision per primary service and Cardiology.    GI signing off.    LOS: 3 days   Norberto Sorenson T. Fuller Plan MD 02/04/2019, 8:25 AM

## 2019-02-04 NOTE — Progress Notes (Signed)
PROGRESS NOTE  Kathleen [name] PTW:[id number] DOB: 08-21-1946 DOA: 02/01/2019 PCP: Nolene Ebbs, MD  Brief History   73 year old woman PMH GI bleed secondary to AVM, diabetes mellitus type 2, CKD stage IV, atrial flutter on apixaban presented with dark stools at home, difficulty walking, dizziness.  Found to be profoundly anemic.  A & P  Acute blood loss anemia secondary to angiodysplastic lesions of the stomach and duodenal bulb.  History of AVM.  Apixaban stopped.  Hemoglobin stable status post 3 units PRBC earlier in hospitalization. Status post small bowel enteroscopy today with findings as below, showing nonbleeding AVM which were treated with APC. --Advance diet per GI, Protonix 40 mg daily for 1 month, avoid NSAIDs, aspirin.  Follow-up with Dr. Almyra Free in 1 month. --Can resume anticoagulation in 48 hours.  However, given recurrent bleeding, consider lower dose of apixaban of 2.5 mg twice daily or alternative therapy. --Will discuss with cardiologist on Monday to discuss risk/benefit to guide ultimate decision  AKI superimposed on CKD stage IV.  Secondary to GI bleed, volume depletion. --Creatinine continues to trend towards baseline, Lasix remains on hold.  Diabetes mellitus type 2 --no recurrent hypoglycemia.  Start Lantus once taking consistent diet.  Atrial fibrillation --Remained stable.  Continue metoprolol.  Hold anticoagulation.  DVT prophylaxis: SCDs Code Status: Full Family Communication: . Disposition Plan: Anticipate discharge home 2/24   [name], MD  [facility] Direct contact: see [email or web address]  7PM-7AM contact night coverage as above 02/04/2019, 12:48 PM  LOS: 3 days   Consultants  . GI  Procedures  . Small bowel enteroscopy Impression:               - Normal esophagus.                           - Two non-bleeding angiodysplastic lesions in the                            body of the stomach. Treated with argon plasma                             coagulation (APC).                           - A single non-bleeding angiodysplastic lesion in                            the duodenal bulb. Treated with argon plasma                            coagulation (APC).                           - Normal second portion of the duodenum, third                            portion of the duodenum and fourth portion of the                            duodenum.                           -  The examined portion of the jejunum was normal.                           - No specimens collected. Recommendation:           - Return patient to hospital ward for ongoing care.                           - Full liquid diet today.                           - Given AVMs with high likelihood of recurrent                            bleeding recommend primary service consider dose                            reduction of Eliquis to 2.5 mg po bid or an                            alternative OAC.                           - OK to resume anticoagulants in 2 days as                            indicated.                           - Avoid ASA/NSAIDs long term.                           - GI follow up with Carol Ada, MD.  Antibiotics  .   Interval History/Subjective  Throat is sore and having some difficulty swallowing meat which has been going on for a while.  No bleeding.  Otherwise feels okay.  Objective   Vitals:  Vitals:   02/04/19 0542 02/04/19 0902  BP: 120/64   Pulse: 75   Resp: 18   Temp: 98.2 F (36.8 C)   SpO2: 95% 95%    Exam:  Constitutional:   . Appears calm and comfortable Eyes:  . pupils and irises appear normal ENMT:  . grossly normal hearing  . Oropharynx appears unremarkable Respiratory:  . CTA bilaterally, no w/r/r.  . Respiratory effort normal.  Cardiovascular:  . Irregular, normal rate, no m/r/g . No LE extremity edema   Abdomen:  . Soft Psychiatric:  . Mental status o Mood, affect appropriate  I have personally reviewed  the following:   Today's Data   CBG stable  Hemoglobin stable at 8.5, creatinine continues to improve, 3.14 today.  BUN trending down as well, 63.  Hemoglobin stable at 8.4.  Platelets stable at 136.  Lab Data  . Noted   Micro Data  .   Imaging  .   Cardiology Data  .   Other Data  .   Scheduled Meds: . allopurinol  100 mg Oral Daily  . insulin aspart  0-9 Units Subcutaneous TID AC & HS  . metoprolol tartrate  25 mg Oral BID  . mometasone-formoterol  2 puff Inhalation BID  . pantoprazole  40 mg Oral Q0600  . pravastatin  80 mg Oral q1800   Continuous Infusions: . lactated ringers 50 mL/hr at 02/04/19 0409    Principal Problem:   Anemia due to GI blood loss Active Problems:   Essential hypertension   Diabetes mellitus type 2, controlled (HCC)   CKD (chronic kidney disease) stage 4, GFR 15-29 ml/min (HCC)   Atrial fibrillation, chronic   AKI (acute kidney injury) ([place])   AVM (arteriovenous malformation) of duodenum, acquired   AVM (arteriovenous malformation) of stomach, acquired with hemorrhage   LOS: 3 days

## 2019-02-05 ENCOUNTER — Encounter (HOSPITAL_COMMUNITY): Payer: Self-pay | Admitting: Gastroenterology

## 2019-02-05 DIAGNOSIS — K31819 Angiodysplasia of stomach and duodenum without bleeding: Secondary | ICD-10-CM

## 2019-02-05 DIAGNOSIS — K31811 Angiodysplasia of stomach and duodenum with bleeding: Principal | ICD-10-CM

## 2019-02-05 LAB — GLUCOSE, CAPILLARY
Glucose-Capillary: 102 mg/dL — ABNORMAL HIGH (ref 70–99)
Glucose-Capillary: 86 mg/dL (ref 70–99)
Glucose-Capillary: 115 mg/dL — ABNORMAL HIGH (ref 70–99)

## 2019-02-05 MED ORDER — POLYETHYLENE GLYCOL 3350 17 G PO PACK
17.0000 g | PACK | Freq: Two times a day (BID) | ORAL | Status: DC
  Administered 2019-02-05: 17 g via ORAL
  Filled 2019-02-05: qty 1

## 2019-02-05 MED ORDER — SENNA 8.6 MG PO TABS
1.0000 | ORAL_TABLET | Freq: Once | ORAL | Status: AC
  Administered 2019-02-05: 8.6 mg via ORAL
  Filled 2019-02-05: qty 1

## 2019-02-05 MED ORDER — METOPROLOL TARTRATE 50 MG PO TABS: 25.0000 mg | ORAL_TABLET | Freq: Two times a day (BID) | ORAL | Status: DC

## 2019-02-05 MED ORDER — BISACODYL 10 MG RE SUPP
10.0000 mg | Freq: Once | RECTAL | Status: AC
  Administered 2019-02-05: 10 mg via RECTAL
  Filled 2019-02-05: qty 1

## 2019-02-05 NOTE — Care Management Important Message (Signed)
Important Message  Patient Details  Name: RAESHAWN TAFOLLA MRN: [id number] Date of Birth: 12-Jan-1946   Medicare Important Message Given:  Yes    Kerin Salen 02/05/2019, 12:22 [place] Message  Patient Details  Name: DONYETTA OGLETREE MRN: [id number] Date of Birth: 07-May-1946   Medicare Important Message Given:  Yes    Kerin Salen 02/05/2019, 12:22 PM

## 2019-02-05 NOTE — Progress Notes (Signed)
AVS reviewed with patient and patient's daughter.  Verbalized understanding of discharge instructions, physician follow-up, medications.  Patient's IV removed.  Site WNL. Patient transported by wheelchair to main entrance at discharge.  Patient stable at time of discharge.

## 2019-02-05 NOTE — Discharge Summary (Signed)
Physician Discharge Summary  Kathleen [name] YNW:[id number] DOB: April 01, 1946 DOA: 02/01/2019  PCP: Nolene Ebbs, MD  Admit date: 02/01/2019 Discharge date: 02/05/2019  Recommendations for Outpatient Follow-up:   Acute blood loss anemia secondary to angiodysplastic lesions of the stomach and duodenal bulb.   --Protonix 40 mg daily for 1 month, avoid NSAIDs, aspirin.  Follow-up with Dr. Benson Norway in 1 month. --Per my discussion with Dr. Percival Spanish, we will stop anticoagulation (atrial fibrillation) for now.  Patient agrees.  Outpatient follow-up with cardiology will be arranged to further discuss.  AKI superimposed on CKD stage IV.  Secondary to GI bleed, volume depletion. --Creatinine continues to trend towards baseline, expect spontaneous resolution.  Recommend routine follow-up with nephrology as an outpatient.  Follow-up Information    Carol Ada, MD. Schedule an appointment as soon as possible for a visit in 1 month(s).   Specialty:  Gastroenterology Contact information: [place], [place] [address] [phone]        Nolene Ebbs, MD. Schedule an appointment as soon as possible for a visit in 1 week(s).   Specialty:  Internal Medicine Contact information: [address] [place] [address] [phone]          Discharge Diagnoses: Principal diagnosis is #1 1. Acute blood loss anemia secondary to angiodysplastic lesions of the stomach and duodenal bulb 2. AKI superimposed on CKD stage IV 3. Diabetes mellitus type 2 4. Atrial fibrillation  Discharge Condition: improved Disposition: home  Diet recommendation: heart healthy, diabetic diet  Filed Weights   02/02/19 0158 02/03/19 0728 02/04/19 0542  Weight: 99.2 kg 99.2 kg 104.8 kg    History of present illness:  73 year old woman PMH GI bleed secondary to AVM, diabetes mellitus type 2, CKD stage IV, atrial flutter on apixaban presented with dark stools at home, difficulty walking,  dizziness.  Found to be profoundly anemic.  Hospital Course:  Patient was transfused 3 units PRBC with stabilization of hemoglobin.  She was seen by GI and underwent small bowel enteroscopy revealing nonbleeding AVM which were treated with APC.  CBC remained stable postprocedure and GI cleared for discharge.  Recommendations as below.  Acute kidney injury improved with transfusion and IV fluids and spontaneous resolution as expected.  I discussed the case with her cardiologist by telephone 2/24 and at this point we will stop anticoagulation.  He has been kind enough to arrange outpatient follow-up with the patient.  Acute blood loss anemia secondary to angiodysplastic lesions of the stomach and duodenal bulb.  History of AVM.  Apixaban stopped.  Hemoglobin stable status post 3 units PRBC earlier in hospitalization. Status post small bowel enteroscopy today with findings as below, showing nonbleeding AVM which were treated with APC. --Advance diet per GI, Protonix 40 mg daily for 1 month, avoid NSAIDs, aspirin.  Follow-up with Dr. Benson Norway in 1 month. --Per my discussion with Dr. Percival Spanish, we will stop anticoagulation for now.  Patient agrees.  Outpatient follow-up with cardiology will be arranged to further discuss.  AKI superimposed on CKD stage IV.  Secondary to GI bleed, volume depletion. --Creatinine continues to trend towards baseline, expect spontaneous resolution.  Resume Lasix 2/25.  Diabetes mellitus type 2 --Stable here in the hospital setting.  Atrial fibrillation --Remained stable.  Continue metoprolol.  Hold anticoagulation.  Consultants   GI  Procedures   Small bowel enteroscopy Impression: - Normal esophagus. - Two non-bleeding angiodysplastic lesions in the  body of the stomach. Treated with argon plasma  coagulation (APC). - A single  non-bleeding  angiodysplastic lesion in  the duodenal bulb. Treated with argon plasma  coagulation (APC). - Normal second portion of the duodenum, third  portion of the duodenum and fourth portion of the  duodenum. - The examined portion of the jejunum was normal. - No specimens collected. Recommendation: - Return patient to hospital ward for ongoing care. - Full liquid diet today. - Given AVMs with high likelihood of recurrent  bleeding recommend primary service consider dose  reduction of Eliquis to 2.5 mg po bid or an  alternative OAC. - OK to resume anticoagulants in 2 days as  indicated. - Avoid ASA/NSAIDs long term. - GI follow up with Carol Ada, MD.  Today's assessment: S: Overall feels okay, does report constipation which is a chronic issue, she usually takes a laxative at home and has a bowel movement.  Reports some left arm swelling after IV infiltration yesterday, this is improved today. O: Vitals:  Vitals:   02/05/19 0724 02/05/19 1027  BP:  139/62  Pulse:  77  Resp:    Temp:    SpO2: 96% 100%    Constitutional:  . Appears calm and comfortable Respiratory:  . CTA bilaterally, no w/r/r.  . Respiratory effort normal.  Cardiovascular:  . Irregular, normal rate, no m/r/g . No significant LE extremity edema   Abdomen:  . Soft, nontender, nondistended Musculoskeletal:  . Left arm . No significant edema or erythema.  Nontender to palpation. Skin:  . Skin on the left arm appears unremarkable Psychiatric:  . Mental status o Mood, affect appropriate  CBG  stable  Discharge Instructions  Discharge Instructions    Diet - low sodium heart healthy   Complete by:  As directed    Diet Carb Modified   Complete by:  As directed    Discharge instructions   Complete by:  As directed    Call your physician or seek immediate medical attention for bleeding, vomiting, dark stools, weakness, passing out, dizziness or worsening of condition.   Increase activity slowly   Complete by:  As directed      Allergies as of 02/05/2019      Reactions   Penicillins Hives, Shortness Of Breath   Tolerates Ceftin, Ceftriaxone, and Cefepime Has patient had a PCN reaction causing immediate rash, facial/tongue/throat swelling, SOB or lightheadedness with hypotension: Y Has patient had a PCN reaction causing severe rash involving mucus membranes or skin necrosis: Y Has patient had a PCN reaction that required hospitalization: Y Has patient had a PCN reaction occurring within the last 10 years: N      Medication List    STOP taking these medications   apixaban 5 MG Tabs tablet Commonly known as:  ELIQUIS     TAKE these medications   albuterol (2.5 MG/3ML) 0.083% nebulizer solution Commonly known as:  PROVENTIL Take 2.5 mg by nebulization every 6 (six) hours as needed for wheezing or shortness of breath.   albuterol 108 (90 Base) MCG/ACT inhaler Commonly known as:  PROVENTIL HFA;VENTOLIN HFA Inhale 1-2 puffs into the lungs every 4 (four) hours as needed for wheezing or shortness of breath.   allopurinol 100 MG tablet Commonly known as:  ZYLOPRIM Take 100 mg by mouth daily.   colchicine 0.6 MG tablet Take 0.6 mg by mouth daily as needed (gout).   cyclobenzaprine 10 MG tablet Commonly known as:  FLEXERIL Take 10 mg by mouth 2 (two) times daily as needed for muscle spasms.   famotidine 20 MG tablet Commonly known  as:  PEPCID Take 20 mg by mouth at bedtime.   furosemide 80 MG tablet Commonly known as:  LASIX Take 0.5 tablets (40 mg total) by mouth  2 (two) times daily. Resume only on 12/26/2018 What changed:    how much to take  when to take this  additional instructions   isosorbide-hydrALAZINE 20-37.5 MG tablet Commonly known as:  BIDIL Take 1 tablet by mouth 3 (three) times daily.   LANTUS SOLOSTAR 100 UNIT/ML Solostar Pen Generic drug:  Insulin Glargine Inject 10 Units into the skin daily. Please take only if your blood glucose levels are greater than 180.   metoprolol tartrate 50 MG tablet Commonly known as:  LOPRESSOR Take 0.5 tablets (25 mg total) by mouth 2 (two) times daily.   pantoprazole 40 MG tablet Commonly known as:  PROTONIX Take 1 tablet (40 mg total) by mouth daily.   pravastatin 80 MG tablet Commonly known as:  PRAVACHOL Take 80 mg by mouth daily.   SYMBICORT 80-4.5 MCG/ACT inhaler Generic drug:  budesonide-formoterol Inhale 2 puffs into the lungs 2 (two) times daily.      Allergies  Allergen Reactions  . Penicillins Hives and Shortness Of Breath    Tolerates Ceftin, Ceftriaxone, and Cefepime Has patient had a PCN reaction causing immediate rash, facial/tongue/throat swelling, SOB or lightheadedness with hypotension: Y Has patient had a PCN reaction causing severe rash involving mucus membranes or skin necrosis: Y Has patient had a PCN reaction that required hospitalization: Y Has patient had a PCN reaction occurring within the last 10 years: N     The results of significant diagnostics from this hospitalization (including imaging, microbiology, ancillary and laboratory) are listed below for reference.    Labs: Basic Metabolic Panel: Recent Labs  Lab 02/01/19 1923 02/02/19 1404 02/03/19 0517 02/04/19 0527  NA 139 141 143 142  K 3.7 3.4* 3.3* 3.4*  CL 100 102 107 107  CO2 27 26 25 25   GLUCOSE 141* 87 81 96  BUN 92* 82* 75* 63*  CREATININE 3.83* 3.59* 3.42* 3.14*  CALCIUM 9.4 9.3 9.1 9.1   Liver Function Tests: Recent Labs  Lab 02/01/19 1923  AST 23  ALT 14  ALKPHOS 114    BILITOT 1.8*  PROT 7.5  ALBUMIN 3.7   CBC: Recent Labs  Lab 02/01/19 1923 02/02/19 1404 02/02/19 1855 02/03/19 0517 02/04/19 0527  WBC 5.3 6.2 6.6 6.4 8.0  HGB 5.6* 8.2* 7.8* 7.9* 8.4*  HCT 19.6* 26.6* 25.8* 26.8* 28.9*  MCV 78.4* 79.9* 80.6 80.7 82.3  PLT 166 136* 133* 131* 136*   CBG: Recent Labs  Lab 02/04/19 1153 02/04/19 1647 02/04/19 1959 02/05/19 0734 02/05/19 1145  GLUCAP 102* 153* 127* 86 115*    Principal Problem:   Anemia due to GI blood loss Active Problems:   Essential hypertension   Diabetes mellitus type 2, controlled (HCC)   CKD (chronic kidney disease) stage 4, GFR 15-29 ml/min (HCC)   Atrial fibrillation, chronic   AKI (acute kidney injury) ([place])   AVM (arteriovenous malformation) of duodenum, acquired   AVM (arteriovenous malformation) of stomach, acquired with hemorrhage   Time coordinating discharge: 35 minutes  Signed:  [name], MD  [facility]  02/05/2019, 1:24 PM

## 2019-02-05 NOTE — Progress Notes (Addendum)
Patient up to bedside commode with assistance.  Patient had bowel movement.  Reports she, "feels better."  Patient up to chair, call-bell in reach, chair alarm in place.

## 2019-02-05 NOTE — Progress Notes (Signed)
Patient took Miralax and Senna. Dulcolax suppository also ordered.  Patient eating breakfast and wanted to wait until after breakfast.  Stated she will notify me when ready.  Daughter at bedside.  Will continue to monitor.

## 2019-03-05 ENCOUNTER — Telehealth: Payer: Self-pay | Admitting: Cardiology

## 2019-03-05 NOTE — Telephone Encounter (Signed)
   Primary Cardiologist:  Minus Breeding, MD   Patient contacted and left voice message to cancel appointment unless return call with cardiac symptoms such as chest pain, SOB or palpitations that have changed since last seen.  History reviewed. We will be postponing this appointment for Kathleen [name].  If symptoms change, she has been instructed to contact our office.  Routing to C19 CANCEL pool for tracking (P CV DIV CV19 CANCEL) and assigning priority ( 2 = 6-12 wks).   Kathyrn Drown, NP  03/05/2019 2:22 PM         .

## 2019-03-09 ENCOUNTER — Ambulatory Visit: Payer: Medicare Other | Admitting: Cardiology

## 2019-03-21 ENCOUNTER — Telehealth: Payer: Self-pay

## 2019-03-21 NOTE — Telephone Encounter (Signed)
Patient returning your call.

## 2019-03-21 NOTE — Telephone Encounter (Signed)
schedule f/u appt

## 2019-03-21 NOTE — Telephone Encounter (Signed)
-  S/w pt she states that she would like to schedule a telephone visit. She has BP cuff and scale and she will have values ready for pre-visit charting and medication review. She does not have mychart or current email as we will mail her AVS after the visit. Notified will be ready when we call  In the setting of the current Covid19 crisis, you are scheduled for a -telephone visit with youram provider on -03-22-2019 at 11am.  Just as we do with many in-office visits, in order for you to participate in this visit, we must obtain consent.  If you'd like, I can send this to your mychart (if signed up) or email for you to review.  Otherwise, I can obtain your verbal consent now.  All virtual visits are billed to your insurance company just like a normal visit would be.  By agreeing to a virtual visit, we'd like you to understand that the technology does not allow for your provider to perform an examination, and thus may limit your provider's ability to fully assess your condition.  Finally, though the technology is pretty good, we cannot assure that it will always work on either your or our end, and in the setting of a video visit, we may have to convert it to a phone-only visit.  In either situation, we cannot ensure that we have a secure connection.  Are you willing to proceed?  Verbalized consent.

## 2019-03-21 NOTE — Telephone Encounter (Signed)
F/U Message         Patient returned Michelle's call and would like a call back.

## 2019-03-22 ENCOUNTER — Telehealth (INDEPENDENT_AMBULATORY_CARE_PROVIDER_SITE_OTHER): Payer: Medicare Other | Admitting: Adult Health

## 2019-03-22 VITALS — BP 104/70 | HR 70 | Temp 97.6°F | Ht 64.0 in | Wt 204.6 lb

## 2019-03-22 DIAGNOSIS — I4891 Unspecified atrial fibrillation: Secondary | ICD-10-CM | POA: Diagnosis not present

## 2019-03-22 DIAGNOSIS — I2510 Atherosclerotic heart disease of native coronary artery without angina pectoris: Secondary | ICD-10-CM | POA: Diagnosis not present

## 2019-03-22 DIAGNOSIS — I5032 Chronic diastolic (congestive) heart failure: Secondary | ICD-10-CM

## 2019-03-22 DIAGNOSIS — I739 Peripheral vascular disease, unspecified: Secondary | ICD-10-CM | POA: Diagnosis not present

## 2019-03-22 DIAGNOSIS — I10 Essential (primary) hypertension: Secondary | ICD-10-CM

## 2019-03-22 NOTE — Progress Notes (Signed)
Virtual Visit via Telephone Note   This visit type was conducted due to national recommendations for restrictions regarding the COVID-19 Pandemic (e.g. social distancing) in an effort to limit this patient's exposure and mitigate transmission in our community.  Due to her co-morbid illnesses, this patient is at least at moderate risk for complications without adequate follow up.  This format is felt to be most appropriate for this patient at this time.  The patient did not have access to video technology/had technical difficulties with video requiring transitioning to audio format only (telephone).  All issues noted in this document were discussed and addressed.  No physical exam could be performed with this format.  Please refer to the patient's chart for her  consent to telehealth for [facility].   Evaluation Performed:  Follow-up visit  This visit type was conducted due to national recommendations for restrictions regarding the COVID-19 Pandemic (e.g. social distancing).  This format is felt to be most appropriate for this patient at this time.  All issues noted in this document were discussed and addressed.  No physical exam was performed (except for noted visual exam findings with Telehealth visits).  See MyChart message from today for the patient's consent to telehealth for [facility].  Patient has given permission to conduct this visit via virtual appointment and to bill insurance, obtained verbally 03/22/2019.   Date:  03/22/2019   ID:  Kathleen [name], DOB May 17, 1946, MRN [id number]  Patient Location:  [address] [place] [address]   Provider location:  [place], -Home office   PCP:  Nolene Ebbs, MD  Cardiologist:  Minus Breeding, MD  Electrophysiologist:  None  PV: Arida   Chief Complaint:  Follow up  History of Present Illness:    Kathleen [name] is a 73 y.o. female who presents via audio/video conferencing for a telehealth visit today for ongoing  assessment and management of atrial fib, HTN, chronic diastolic CHF, CAD s/p RCA stent in 2003, HL, with other history of Type II diabetes, CKD.Kathleen [name]   She also has PAD with non-invasive studies showing ABI of 0.67 on the right and 0.76 on the left. Duplex of the lower extremities revealed significant right SFA stenosis with peak velocity of 400; left had significant disease in the mid and distal SFA. There was mild to moderate femoral artery disease. She was seen by Dr. Fletcher Anon on 08/22/2018. She reported mild intermittent claudication symptoms. Therefore she was elected to be treated medically   She was recently discharged from [facility] on 02/05/2019 after admission for acute blood loss anemia, secondary to angiodysplastic lesions of the stomach and duodenal bulb. Discussion with Dr. Percival Spanish by Hospitalist made the decision to stop anticoagulation.Kathleen [name] She was to follow up with nephrology. She was to be placed on Protonix 40 mg for one month, to avoid NSAID's, and see Dr. Benson Norway her PCP on follow up.   She has no complaints other than baseline DOE. She denies increased pain in her legs with walking, chest pain or fatigue. She is medically compliant.   The patient does not symptoms concerning for COVID-19 infection (fever, chills, cough, or new SHORTNESS OF BREATH).    Prior CV studies:   The following studies were reviewed today:  Echocardiogram 03/24/2018  Left ventricle: The cavity size was normal. Systolic function was   normal. The estimated ejection fraction was in the range of 60%   to 65%. Wall motion was normal; there were no regional wall   motion  abnormalities. Doppler parameters are consistent with high   ventricular filling pressure. - Aortic valve: Trileaflet; mildly thickened, mildly calcified   leaflets. There was mild regurgitation. - Mitral valve: Calcified annulus. There was moderate regurgitation   directed eccentrically and toward the free wall. Mean gradient   (D): 4 mm Hg. Valve area  by pressure half-time: 2.86 cm^2. Valve   area by continuity equation (using LVOT flow): 1.26 cm^2. - Left atrium: The atrium was moderately dilated. - Right ventricle: Systolic function was mildly reduced. - Right atrium: The atrium was moderately dilated. - Pulmonary arteries: PA peak pressure: 38 mm Hg (S).  Impressions:  - The right ventricular systolic pressure was increased consistent   with mild pulmonary hypertension.  Past Medical History:  Diagnosis Date  . Arthritis   . Blood transfusion    no side affects  . CHF (congestive heart failure) ([place])   . Coronary artery disease    Cypher stent to the RCA in 2003  . Diabetes mellitus    Borderline  . GERD (gastroesophageal reflux disease)   . Hypertension   . Shortness of breath   . Sleep apnea    Past Surgical History:  Procedure Laterality Date  . BIOPSY  06/06/2018   Procedure: BIOPSY;  Surgeon: Otis Brace, MD;  Location: [facility] ENDOSCOPY;  Service: Gastroenterology;;  . CARDIOVERSION N/A 03/17/2015   Procedure: CARDIOVERSION;  Surgeon: Sueanne Margarita, MD;  Location: [facility] ENDOSCOPY;  Service: Cardiovascular;  Laterality: N/A;  . CHOLECYSTECTOMY    . CORONARY ANGIOPLASTY WITH STENT PLACEMENT    . ENTEROSCOPY N/A 06/28/2018   Procedure: ENTEROSCOPY;  Surgeon: Carol Ada, MD;  Location: [facility] ENDOSCOPY;  Service: Endoscopy;  Laterality: N/A;  . ENTEROSCOPY N/A 09/12/2018   Procedure: ENTEROSCOPY;  Surgeon: Carol Ada, MD;  Location: [facility] ENDOS[facility];  Service: Endoscopy;  Laterality: N/A;  . ENTEROSCOPY N/A 12/21/2018   Procedure: ENTEROSCOPY;  Surgeon: Carol Ada, MD;  Location: [facility] ENDOSCOPY;  Service: Endoscopy;  Laterality: N/A;  . ENTEROSCOPY N/A 02/03/2019   Procedure: ENTEROSCOPY;  Surgeon: Ladene Artist, MD;  Location: [facility]OSCOPY;  Service: Endoscopy;  Laterality: N/A;  . ESOPHAGOGASTRODUODENOSCOPY (EGD) WITH PROPOFOL N/A 06/06/2018   Procedure: ESOPHAGOGASTRODUODENOSCOPY (EGD) WITH PROPOFOL;  Surgeon:  Otis Brace, MD;  Location: [facility]OS[facility];  Service: Gastroenterology;  Laterality: N/A;  . GIVENS CAPSULE STUDY N/A 06/25/2018   Procedure: GIVENS CAPSULE STUDY;  Surgeon: Carol Ada, MD;  Location: [facility];  Service: Endoscopy;  Laterality: N/A;  . HOT HEMOSTASIS N/A 06/06/2018   Procedure: HOT HEMOSTASIS (ARGON PLASMA COAGULATION/BICAP);  Surgeon: Otis Brace, MD;  Location: [facility];  Service: Gastroenterology;  Laterality: N/A;  . HOT HEMOSTASIS N/A 06/28/2018   Procedure: HOT HEMOSTASIS (ARGON PLASMA COAGULATION/BICAP);  Surgeon: Carol Ada, MD;  Location: [facility];  Service: Endoscopy;  Laterality: N/A;  . HOT HEMOSTASIS N/A 12/21/2018   Procedure: HOT HEMOSTASIS (ARGON PLASMA COAGULATION/BICAP);  Surgeon: Carol Ada, MD;  Location: [facility];  Service: Endoscopy;  Laterality: N/A;  . HOT HEMOSTASIS N/A 02/03/2019   Procedure: HOT HEMOSTASIS (ARGON PLASMA COAGULATION/BICAP);  Surgeon: Ladene Artist, MD;  Location: [facility];  Service: Endoscopy;  Laterality: N/A;  . TOTAL KNEE ARTHROPLASTY       Current Meds  Medication Sig  . albuterol (PROVENTIL HFA;VENTOLIN HFA) 108 (90 Base) MCG/ACT inhaler Inhale 1-2 puffs into the lungs every 4 (four) hours as needed for wheezing or shortness of breath.  Kathleen [name] albuterol (PROVENTIL) (2.5 MG/3ML) 0.083% nebulizer solution Take 2.5 mg by nebulization  every 6 (six) hours as needed for wheezing or shortness of breath.  . allopurinol (ZYLOPRIM) 100 MG tablet Take 100 mg by mouth daily.  . colchicine 0.6 MG tablet Take 0.6 mg by mouth daily as needed (gout).  . cyclobenzaprine (FLEXERIL) 10 MG tablet Take 10 mg by mouth 2 (two) times daily as needed for muscle spasms.   . famotidine (PEPCID) 20 MG tablet Take 20 mg by mouth at bedtime.   . furosemide (LASIX) 80 MG tablet Take 0.5 tablets (40 mg total) by mouth 2 (two) times daily. Resume only on 12/26/2018 (Patient taking differently: Take 40-80 mg by mouth as directed. 80  MG in AM and 40 MG in PM)  . isosorbide-hydrALAZINE (BIDIL) 20-37.5 MG tablet Take 1 tablet by mouth 3 (three) times daily.  Kathleen [name] LANTUS SOLOSTAR 100 UNIT/ML Solostar Pen Inject 10 Units into the skin daily. Please take only if your blood glucose levels are greater than 180.  . metoprolol tartrate (LOPRESSOR) 50 MG tablet Take 0.5 tablets (25 mg total) by mouth 2 (two) times daily.  . pantoprazole (PROTONIX) 40 MG tablet Take 1 tablet (40 mg total) by mouth daily.  . pravastatin (PRAVACHOL) 80 MG tablet Take 80 mg by mouth daily.  . SYMBICORT 80-4.5 MCG/ACT inhaler Inhale 2 puffs into the lungs 2 (two) times daily.     Allergies:   Penicillins   Social History   Tobacco Use  . Smoking status: Former Smoker    Packs/day: 0.30    Years: 30.00    Pack years: 9.00    Types: Cigarettes    Last attempt to quit: 01/18/2014    Years since quitting: 5.1  . Smokeless tobacco: Never Used  . Tobacco comment: Smoking cessation requested   Substance Use Topics  . Alcohol use: No  . Drug use: No     Family Hx: The patient's family history includes Arthritis in her father; Gout in her father; Heart attack in her brother and father; Heart disease in her father; Hypertension in her brother, father, and sister. There is no history of Stroke.  ROS:   Please see the history of present illness.     All other systems reviewed and are negative.   Labs/Other Tests and Data Reviewed:    Recent Labs: 09/12/2018: Magnesium 1.8 12/19/2018: B Natriuretic Peptide 863.6 02/01/2019: ALT 14 02/04/2019: BUN 63; Creatinine, Ser 3.14; Hemoglobin 8.4; Platelets 136; Potassium 3.4; Sodium 142   Recent Lipid Panel Lab Results  Component Value Date/Time   CHOL 158 01/25/2015 03:34 AM   TRIG 77 01/25/2015 03:34 AM   HDL 46 01/25/2015 03:34 AM   CHOLHDL 3.4 01/25/2015 03:34 AM   LDLCALC 97 01/25/2015 03:34 AM    Wt Readings from Last 3 Encounters:  03/22/19 204 lb 9.6 oz (92.8 kg)  02/04/19 231 lb 1.6 oz (104.8  kg)  12/19/18 223 lb 15.8 oz (101.6 kg)     Exam:    Vital Signs:  BP 104/70 (BP Location: Left Arm)   Pulse 70   Temp 97.6 F (36.4 C)   Ht 5\' 4"  (1.626 m)   Wt 204 lb 9.6 oz (92.8 kg)   BMI 35.12 kg/m    Difficult to assess via phone. No dyspnea when speaking, she understands and responds appropriately to questions and verbalizes understanding.   ASSESSMENT & PLAN:    1. CAD: No cardiac complaints, breathing is at baseline. She will continue secondary prevention, continue metoprolol.  2. Atrial fib: She has been taken  off of the anticoagulation therapy due to GI bleed. Heart rate is well controlled.   3. Hypertension: Low normal today. She will continue isosorbide-hydralazine 20-37.5 mg TID. Follow up labs are being drawn per PCP tomorrow.  4. Chronic Diastolic CHF: She remains on lasix.80 mg in the am and 60 mg in the pm. No cramping or palpitations.. Needs to have BMET. She says that she sees her PCP tomorrow.    COVID-19 Education: The signs and symptoms of COVID-19 were discussed with the patient and how to seek care for testing (follow up with PCP or arrange E-visit).  The importance of social distancing was discussed today.  Patient Risk:   After full review of this patients clinical status, I feel that they are at least moderate risk at this time.  Time:   Today, I have spent 10 minutes with the patient with telehealth technology discussing above     Medication Adjustments/Labs and Tests Ordered: Current medicines are reviewed at length with the patient today.  Concerns regarding medicines are outlined above.   Tests Ordered: No orders of the defined types were placed in this encounter.  Medication Changes: No orders of the defined types were placed in this encounter.   Disposition: 3-4 months   Signed, Phill Myron. West Pugh, ANP, AACC  03/22/2019 11:17 AM    [place] [place], [place], [place]  [address] Phone: [phone]; Fax: [phone]

## 2019-03-22 NOTE — Patient Instructions (Addendum)
Follow-Up: You will need a follow up appointment in 3-4 months.  Please call our office 2 months in advance to schedule this appointment.  You may see Minus Breeding, MD or one of the following   advanced Practice Providers on your designated Care Team:  Rosaria Ferries, PA-C  Jory Sims, DNP, ANP       Medication Instructions:  NO CHANGES- Your physician recommends that you continue on your current medications as directed. Please refer to the Current Medication list given to you today. If you need a refill on your cardiac medications before your next appointment, please call your pharmacy. Labwork: When you have labs (blood work) and your tests are completely normal, you will receive your results ONLY by [place] (if you have MyChart) -OR- A paper copy in the mail.  At [facility], you and your health needs are our priority.  As part of our continuing mission to provide you with exceptional heart care, we have created designated Provider Care Teams.  These Care Teams include your primary Cardiologist (physician) and Advanced Practice Providers (APPs -  Physician Assistants and Nurse Practitioners) who all work together to provide you with the care you need, when you need it.  Thank you for choosing [facility] at [facility]!!

## 2019-03-30 NOTE — Telephone Encounter (Signed)
Patient has virtual visit with Arnold Long on 4/9

## 2019-04-26 ENCOUNTER — Other Ambulatory Visit: Payer: Self-pay

## 2019-04-26 ENCOUNTER — Ambulatory Visit: Payer: Medicare Other | Admitting: Podiatry

## 2019-04-26 ENCOUNTER — Ambulatory Visit (INDEPENDENT_AMBULATORY_CARE_PROVIDER_SITE_OTHER): Payer: Medicare Other | Admitting: Orthotics

## 2019-04-26 DIAGNOSIS — M2012 Hallux valgus (acquired), left foot: Secondary | ICD-10-CM | POA: Diagnosis not present

## 2019-04-26 DIAGNOSIS — E1151 Type 2 diabetes mellitus with diabetic peripheral angiopathy without gangrene: Secondary | ICD-10-CM | POA: Diagnosis not present

## 2019-04-26 DIAGNOSIS — Q828 Other specified congenital malformations of skin: Secondary | ICD-10-CM

## 2019-04-26 DIAGNOSIS — M79674 Pain in right toe(s): Secondary | ICD-10-CM

## 2019-04-26 DIAGNOSIS — M79675 Pain in left toe(s): Secondary | ICD-10-CM

## 2019-04-26 DIAGNOSIS — M2041 Other hammer toe(s) (acquired), right foot: Secondary | ICD-10-CM

## 2019-04-26 DIAGNOSIS — B351 Tinea unguium: Secondary | ICD-10-CM

## 2019-04-26 DIAGNOSIS — M2011 Hallux valgus (acquired), right foot: Secondary | ICD-10-CM

## 2019-04-26 DIAGNOSIS — M2042 Other hammer toe(s) (acquired), left foot: Secondary | ICD-10-CM

## 2019-04-26 DIAGNOSIS — L84 Corns and callosities: Secondary | ICD-10-CM

## 2019-04-26 NOTE — Progress Notes (Signed)

## 2019-04-27 ENCOUNTER — Ambulatory Visit: Payer: Medicare Other | Admitting: Podiatry

## 2019-05-16 ENCOUNTER — Telehealth: Payer: Self-pay | Admitting: *Deleted

## 2019-05-16 NOTE — Telephone Encounter (Signed)
I called to schedule Kathleen [name] her follow up visit for September, she stated I will not be in [place] in September.

## 2019-05-22 ENCOUNTER — Ambulatory Visit (INDEPENDENT_AMBULATORY_CARE_PROVIDER_SITE_OTHER): Payer: Medicare Other | Admitting: Podiatry

## 2019-05-22 ENCOUNTER — Encounter: Payer: Self-pay | Admitting: Podiatry

## 2019-05-22 ENCOUNTER — Other Ambulatory Visit: Payer: Self-pay

## 2019-05-22 VITALS — Temp 97.5°F

## 2019-05-22 DIAGNOSIS — E119 Type 2 diabetes mellitus without complications: Secondary | ICD-10-CM | POA: Diagnosis not present

## 2019-05-22 DIAGNOSIS — M79675 Pain in left toe(s): Secondary | ICD-10-CM

## 2019-05-22 DIAGNOSIS — Z794 Long term (current) use of insulin: Secondary | ICD-10-CM

## 2019-05-22 DIAGNOSIS — B351 Tinea unguium: Secondary | ICD-10-CM | POA: Diagnosis not present

## 2019-05-22 DIAGNOSIS — M79674 Pain in right toe(s): Secondary | ICD-10-CM | POA: Diagnosis not present

## 2019-05-22 NOTE — Patient Instructions (Signed)
Diabetes Mellitus and Foot Care  Foot care is an important part of your health, especially when you have diabetes. Diabetes may cause you to have problems because of poor blood flow (circulation) to your feet and legs, which can cause your skin to:   Become thinner and drier.   Break more easily.   Heal more slowly.   Peel and crack.  You may also have nerve damage (neuropathy) in your legs and feet, causing decreased feeling in them. This means that you may not notice minor injuries to your feet that could lead to more serious problems. Noticing and addressing any potential problems early is the best way to prevent future foot problems.  How to care for your feet  Foot hygiene   Wash your feet daily with warm water and mild soap. Do not use hot water. Then, pat your feet and the areas between your toes until they are completely dry. Do not soak your feet as this can dry your skin.   Trim your toenails straight across. Do not dig under them or around the cuticle. File the edges of your nails with an emery board or nail file.   Apply a moisturizing lotion or petroleum jelly to the skin on your feet and to dry, brittle toenails. Use lotion that does not contain alcohol and is unscented. Do not apply lotion between your toes.  Shoes and socks   Wear clean socks or stockings every day. Make sure they are not too tight. Do not wear knee-high stockings since they may decrease blood flow to your legs.   Wear shoes that fit properly and have enough cushioning. Always look in your shoes before you put them on to be sure there are no objects inside.   To break in new shoes, wear them for just a few hours a day. This prevents injuries on your feet.  Wounds, scrapes, corns, and calluses   Check your feet daily for blisters, cuts, bruises, sores, and redness. If you cannot see the bottom of your feet, use a mirror or ask someone for help.   Do not cut corns or calluses or try to remove them with medicine.   If you  find a minor scrape, cut, or break in the skin on your feet, keep it and the skin around it clean and dry. You may clean these areas with mild soap and water. Do not clean the area with peroxide, alcohol, or iodine.   If you have a wound, scrape, corn, or callus on your foot, look at it several times a day to make sure it is healing and not infected. Check for:  ? Redness, swelling, or pain.  ? Fluid or blood.  ? Warmth.  ? Pus or a bad smell.  General instructions   Do not cross your legs. This may decrease blood flow to your feet.   Do not use heating pads or hot water bottles on your feet. They may burn your skin. If you have lost feeling in your feet or legs, you may not know this is happening until it is too late.   Protect your feet from hot and cold by wearing shoes, such as at the beach or on hot pavement.   Schedule a complete foot exam at least once a year (annually) or more often if you have foot problems. If you have foot problems, report any cuts, sores, or bruises to your health care provider immediately.  Contact a health care provider if:     You have a medical condition that increases your risk of infection and you have any cuts, sores, or bruises on your feet.   You have an injury that is not healing.   You have redness on your legs or feet.   You feel burning or tingling in your legs or feet.   You have pain or cramps in your legs and feet.   Your legs or feet are numb.   Your feet always feel cold.   You have pain around a toenail.  Get help right away if:   You have a wound, scrape, corn, or callus on your foot and:  ? You have pain, swelling, or redness that gets worse.  ? You have fluid or blood coming from the wound, scrape, corn, or callus.  ? Your wound, scrape, corn, or callus feels warm to the touch.  ? You have pus or a bad smell coming from the wound, scrape, corn, or callus.  ? You have a fever.  ? You have a red line going up your leg.  Summary   Check your feet every day  for cuts, sores, red spots, swelling, and blisters.   Moisturize feet and legs daily.   Wear shoes that fit properly and have enough cushioning.   If you have foot problems, report any cuts, sores, or bruises to your health care provider immediately.   Schedule a complete foot exam at least once a year (annually) or more often if you have foot problems.  This information is not intended to replace advice given to you by your health care provider. Make sure you discuss any questions you have with your health care provider.  Document Released: 11/26/2000 Document Revised: 01/11/2018 Document Reviewed: 12/31/2016  Elsevier Interactive Patient Education  2019 [employer]

## 2019-05-29 ENCOUNTER — Telehealth: Payer: Self-pay | Admitting: *Deleted

## 2019-05-29 NOTE — Telephone Encounter (Signed)
Removed recall, Kathleen [name] said she is moving from [place] this month.(June 2020)

## 2019-05-30 ENCOUNTER — Other Ambulatory Visit: Payer: Self-pay

## 2019-05-30 ENCOUNTER — Emergency Department (HOSPITAL_COMMUNITY): Payer: Medicare Other

## 2019-05-30 ENCOUNTER — Encounter (HOSPITAL_COMMUNITY): Payer: Self-pay

## 2019-05-30 ENCOUNTER — Inpatient Hospital Stay (HOSPITAL_COMMUNITY)
Admission: EM | Admit: 2019-05-30 | Discharge: 2019-06-14 | DRG: 438 | Disposition: A | Discharge status: Hospice | Payer: Medicare Other | Attending: Internal Medicine | Admitting: Internal Medicine

## 2019-05-30 ENCOUNTER — Observation Stay (HOSPITAL_COMMUNITY): Payer: Medicare Other

## 2019-05-30 DIAGNOSIS — I130 Hypertensive heart and chronic kidney disease with heart failure and stage 1 through stage 4 chronic kidney disease, or unspecified chronic kidney disease: Secondary | ICD-10-CM | POA: Diagnosis present

## 2019-05-30 DIAGNOSIS — I2510 Atherosclerotic heart disease of native coronary artery without angina pectoris: Secondary | ICD-10-CM | POA: Diagnosis present

## 2019-05-30 DIAGNOSIS — R1310 Dysphagia, unspecified: Secondary | ICD-10-CM | POA: Diagnosis not present

## 2019-05-30 DIAGNOSIS — I482 Chronic atrial fibrillation, unspecified: Secondary | ICD-10-CM | POA: Diagnosis present

## 2019-05-30 DIAGNOSIS — K8500 Idiopathic acute pancreatitis without necrosis or infection: Secondary | ICD-10-CM

## 2019-05-30 DIAGNOSIS — R17 Unspecified jaundice: Secondary | ICD-10-CM | POA: Diagnosis present

## 2019-05-30 DIAGNOSIS — Z7951 Long term (current) use of inhaled steroids: Secondary | ICD-10-CM

## 2019-05-30 DIAGNOSIS — E785 Hyperlipidemia, unspecified: Secondary | ICD-10-CM | POA: Diagnosis present

## 2019-05-30 DIAGNOSIS — E11649 Type 2 diabetes mellitus with hypoglycemia without coma: Secondary | ICD-10-CM | POA: Diagnosis present

## 2019-05-30 DIAGNOSIS — M109 Gout, unspecified: Secondary | ICD-10-CM | POA: Diagnosis present

## 2019-05-30 DIAGNOSIS — K429 Umbilical hernia without obstruction or gangrene: Secondary | ICD-10-CM | POA: Diagnosis present

## 2019-05-30 DIAGNOSIS — E876 Hypokalemia: Secondary | ICD-10-CM | POA: Diagnosis present

## 2019-05-30 DIAGNOSIS — E870 Hyperosmolality and hypernatremia: Secondary | ICD-10-CM | POA: Diagnosis present

## 2019-05-30 DIAGNOSIS — E162 Hypoglycemia, unspecified: Secondary | ICD-10-CM | POA: Diagnosis not present

## 2019-05-30 DIAGNOSIS — Z880 Allergy status to penicillin: Secondary | ICD-10-CM

## 2019-05-30 DIAGNOSIS — R4781 Slurred speech: Secondary | ICD-10-CM | POA: Diagnosis present

## 2019-05-30 DIAGNOSIS — D72829 Elevated white blood cell count, unspecified: Secondary | ICD-10-CM

## 2019-05-30 DIAGNOSIS — L89152 Pressure ulcer of sacral region, stage 2: Secondary | ICD-10-CM | POA: Diagnosis present

## 2019-05-30 DIAGNOSIS — Z7901 Long term (current) use of anticoagulants: Secondary | ICD-10-CM

## 2019-05-30 DIAGNOSIS — Z6839 Body mass index (BMI) 39.0-39.9, adult: Secondary | ICD-10-CM

## 2019-05-30 DIAGNOSIS — J811 Chronic pulmonary edema: Secondary | ICD-10-CM

## 2019-05-30 DIAGNOSIS — I5032 Chronic diastolic (congestive) heart failure: Secondary | ICD-10-CM | POA: Diagnosis present

## 2019-05-30 DIAGNOSIS — R188 Other ascites: Secondary | ICD-10-CM | POA: Diagnosis present

## 2019-05-30 DIAGNOSIS — Z8261 Family history of arthritis: Secondary | ICD-10-CM

## 2019-05-30 DIAGNOSIS — K922 Gastrointestinal hemorrhage, unspecified: Secondary | ICD-10-CM | POA: Diagnosis present

## 2019-05-30 DIAGNOSIS — I340 Nonrheumatic mitral (valve) insufficiency: Secondary | ICD-10-CM | POA: Diagnosis present

## 2019-05-30 DIAGNOSIS — N184 Chronic kidney disease, stage 4 (severe): Secondary | ICD-10-CM | POA: Diagnosis present

## 2019-05-30 DIAGNOSIS — I5033 Acute on chronic diastolic (congestive) heart failure: Secondary | ICD-10-CM

## 2019-05-30 DIAGNOSIS — D631 Anemia in chronic kidney disease: Secondary | ICD-10-CM | POA: Diagnosis present

## 2019-05-30 DIAGNOSIS — I959 Hypotension, unspecified: Secondary | ICD-10-CM

## 2019-05-30 DIAGNOSIS — R945 Abnormal results of liver function studies: Secondary | ICD-10-CM | POA: Diagnosis present

## 2019-05-30 DIAGNOSIS — Z79899 Other long term (current) drug therapy: Secondary | ICD-10-CM

## 2019-05-30 DIAGNOSIS — K5732 Diverticulitis of large intestine without perforation or abscess without bleeding: Secondary | ICD-10-CM | POA: Diagnosis present

## 2019-05-30 DIAGNOSIS — I4819 Other persistent atrial fibrillation: Secondary | ICD-10-CM | POA: Diagnosis present

## 2019-05-30 DIAGNOSIS — R627 Adult failure to thrive: Secondary | ICD-10-CM | POA: Diagnosis present

## 2019-05-30 DIAGNOSIS — Z8249 Family history of ischemic heart disease and other diseases of the circulatory system: Secondary | ICD-10-CM

## 2019-05-30 DIAGNOSIS — Z794 Long term (current) use of insulin: Secondary | ICD-10-CM

## 2019-05-30 DIAGNOSIS — K529 Noninfective gastroenteritis and colitis, unspecified: Secondary | ICD-10-CM | POA: Diagnosis present

## 2019-05-30 DIAGNOSIS — E538 Deficiency of other specified B group vitamins: Secondary | ICD-10-CM | POA: Diagnosis present

## 2019-05-30 DIAGNOSIS — Z1159 Encounter for screening for other viral diseases: Secondary | ICD-10-CM

## 2019-05-30 DIAGNOSIS — I4891 Unspecified atrial fibrillation: Secondary | ICD-10-CM | POA: Clinically undetermined

## 2019-05-30 DIAGNOSIS — M1990 Unspecified osteoarthritis, unspecified site: Secondary | ICD-10-CM | POA: Diagnosis present

## 2019-05-30 DIAGNOSIS — E1122 Type 2 diabetes mellitus with diabetic chronic kidney disease: Secondary | ICD-10-CM

## 2019-05-30 DIAGNOSIS — G4730 Sleep apnea, unspecified: Secondary | ICD-10-CM | POA: Diagnosis present

## 2019-05-30 DIAGNOSIS — K219 Gastro-esophageal reflux disease without esophagitis: Secondary | ICD-10-CM | POA: Diagnosis present

## 2019-05-30 DIAGNOSIS — J440 Chronic obstructive pulmonary disease with acute lower respiratory infection: Secondary | ICD-10-CM | POA: Diagnosis present

## 2019-05-30 DIAGNOSIS — Z955 Presence of coronary angioplasty implant and graft: Secondary | ICD-10-CM

## 2019-05-30 DIAGNOSIS — E669 Obesity, unspecified: Secondary | ICD-10-CM | POA: Diagnosis present

## 2019-05-30 DIAGNOSIS — J189 Pneumonia, unspecified organism: Secondary | ICD-10-CM | POA: Diagnosis present

## 2019-05-30 DIAGNOSIS — R000 Tachycardia, unspecified: Secondary | ICD-10-CM | POA: Diagnosis present

## 2019-05-30 DIAGNOSIS — Y95 Nosocomial condition: Secondary | ICD-10-CM | POA: Diagnosis present

## 2019-05-30 DIAGNOSIS — R7989 Other specified abnormal findings of blood chemistry: Secondary | ICD-10-CM | POA: Diagnosis present

## 2019-05-30 DIAGNOSIS — Z87891 Personal history of nicotine dependence: Secondary | ICD-10-CM

## 2019-05-30 DIAGNOSIS — Z9181 History of falling: Secondary | ICD-10-CM

## 2019-05-30 DIAGNOSIS — Z9049 Acquired absence of other specified parts of digestive tract: Secondary | ICD-10-CM

## 2019-05-30 DIAGNOSIS — R0902 Hypoxemia: Secondary | ICD-10-CM | POA: Diagnosis present

## 2019-05-30 DIAGNOSIS — G9341 Metabolic encephalopathy: Secondary | ICD-10-CM | POA: Diagnosis present

## 2019-05-30 DIAGNOSIS — E872 Acidosis: Secondary | ICD-10-CM | POA: Diagnosis present

## 2019-05-30 DIAGNOSIS — E860 Dehydration: Secondary | ICD-10-CM | POA: Diagnosis present

## 2019-05-30 DIAGNOSIS — E2740 Unspecified adrenocortical insufficiency: Secondary | ICD-10-CM | POA: Diagnosis present

## 2019-05-30 DIAGNOSIS — K5733 Diverticulitis of large intestine without perforation or abscess with bleeding: Secondary | ICD-10-CM

## 2019-05-30 DIAGNOSIS — D61818 Other pancytopenia: Secondary | ICD-10-CM | POA: Diagnosis present

## 2019-05-30 DIAGNOSIS — E119 Type 2 diabetes mellitus without complications: Secondary | ICD-10-CM

## 2019-05-30 DIAGNOSIS — N179 Acute kidney failure, unspecified: Secondary | ICD-10-CM | POA: Diagnosis present

## 2019-05-30 DIAGNOSIS — I272 Pulmonary hypertension, unspecified: Secondary | ICD-10-CM | POA: Diagnosis present

## 2019-05-30 DIAGNOSIS — I10 Essential (primary) hypertension: Secondary | ICD-10-CM | POA: Diagnosis present

## 2019-05-30 DIAGNOSIS — I252 Old myocardial infarction: Secondary | ICD-10-CM

## 2019-05-30 DIAGNOSIS — D509 Iron deficiency anemia, unspecified: Secondary | ICD-10-CM | POA: Diagnosis present

## 2019-05-30 HISTORY — DX: Chronic kidney disease, stage 4 (severe): N18.4

## 2019-05-30 LAB — CBC WITH DIFFERENTIAL/PLATELET
Abs Immature Granulocytes: 0.03 10*3/uL (ref 0.00–0.07)
Basophils Absolute: 0 10*3/uL (ref 0.0–0.1)
Basophils Relative: 0 %
Eosinophils Absolute: 0 10*3/uL (ref 0.0–0.5)
Eosinophils Relative: 0 %
HCT: 36.2 % (ref 36.0–46.0)
Hemoglobin: 10.9 g/dL — ABNORMAL LOW (ref 12.0–15.0)
Immature Granulocytes: 1 %
Lymphocytes Relative: 19 %
Lymphs Abs: 1 10*3/uL (ref 0.7–4.0)
MCH: 24 pg — ABNORMAL LOW (ref 26.0–34.0)
MCHC: 30.1 g/dL (ref 30.0–36.0)
MCV: 79.7 fL — ABNORMAL LOW (ref 80.0–100.0)
Monocytes Absolute: 0.6 10*3/uL (ref 0.1–1.0)
Monocytes Relative: 11 %
Neutro Abs: 3.5 10*3/uL (ref 1.7–7.7)
Neutrophils Relative %: 69 %
Platelets: 139 10*3/uL — ABNORMAL LOW (ref 150–400)
RBC: 4.54 MIL/uL (ref 3.87–5.11)
RDW: 26 % — ABNORMAL HIGH (ref 11.5–15.5)
WBC: 5.1 10*3/uL (ref 4.0–10.5)
nRBC: 0.6 % — ABNORMAL HIGH (ref 0.0–0.2)

## 2019-05-30 LAB — SARS CORONAVIRUS 2 BY RT PCR (HOSPITAL ORDER, PERFORMED IN ~~LOC~~ HOSPITAL LAB): SARS Coronavirus 2: NEGATIVE

## 2019-05-30 LAB — COMPREHENSIVE METABOLIC PANEL
ALT: 40 U/L (ref 0–44)
AST: 66 U/L — ABNORMAL HIGH (ref 15–41)
Albumin: 4.1 g/dL (ref 3.5–5.0)
Alkaline Phosphatase: 91 U/L (ref 38–126)
Anion gap: 20 — ABNORMAL HIGH (ref 5–15)
BUN: 85 mg/dL — ABNORMAL HIGH (ref 8–23)
CO2: 22 mmol/L (ref 22–32)
Calcium: 10 mg/dL (ref 8.9–10.3)
Chloride: 103 mmol/L (ref 98–111)
Creatinine, Ser: 3.45 mg/dL — ABNORMAL HIGH (ref 0.44–1.00)
GFR calc Af Amer: 14 mL/min — ABNORMAL LOW (ref >60)
GFR calc non Af Amer: 12 mL/min — ABNORMAL LOW (ref >60)
Glucose, Bld: 76 mg/dL (ref 70–99)
Potassium: 3.7 mmol/L (ref 3.5–5.1)
Sodium: 145 mmol/L (ref 135–145)
Total Bilirubin: 5.6 mg/dL — ABNORMAL HIGH (ref 0.3–1.2)
Total Protein: 7.7 g/dL (ref 6.5–8.1)

## 2019-05-30 LAB — CBG MONITORING, ED
Glucose-Capillary: 66 mg/dL — ABNORMAL LOW (ref 70–99)
Glucose-Capillary: 69 mg/dL — ABNORMAL LOW (ref 70–99)
Glucose-Capillary: 142 mg/dL — ABNORMAL HIGH (ref 70–99)

## 2019-05-30 LAB — BRAIN NATRIURETIC PEPTIDE: B Natriuretic Peptide: 1478.3 pg/mL — ABNORMAL HIGH (ref 0.0–100.0)

## 2019-05-30 LAB — GLUCOSE, CAPILLARY
Glucose-Capillary: 118 mg/dL — ABNORMAL HIGH (ref 70–99)
Glucose-Capillary: 115 mg/dL — ABNORMAL HIGH (ref 70–99)
Glucose-Capillary: 83 mg/dL (ref 70–99)
Glucose-Capillary: 80 mg/dL (ref 70–99)

## 2019-05-30 LAB — TROPONIN I: Troponin I: 0.06 ng/mL (ref <0.03)

## 2019-05-30 LAB — LIPASE, BLOOD: Lipase: 127 U/L — ABNORMAL HIGH (ref 11–51)

## 2019-05-30 MED ORDER — DEXTROSE 50 % IV SOLN
1.0000 | Freq: Once | INTRAVENOUS | Status: AC
  Administered 2019-05-30: 06:00:00 50 mL via INTRAVENOUS
  Filled 2019-05-30: qty 50

## 2019-05-30 MED ORDER — ONDANSETRON HCL 4 MG/2ML IJ SOLN
4.0000 mg | Freq: Four times a day (QID) | INTRAMUSCULAR | Status: DC | PRN
  Administered 2019-05-30 – 2019-06-02 (×2): 4 mg via INTRAVENOUS
  Filled 2019-05-30 (×2): qty 2

## 2019-05-30 MED ORDER — MOMETASONE FURO-FORMOTEROL FUM 100-5 MCG/ACT IN AERO
2.0000 | INHALATION_SPRAY | Freq: Two times a day (BID) | RESPIRATORY_TRACT | Status: DC
  Administered 2019-05-30 – 2019-06-12 (×25): 2 via RESPIRATORY_TRACT
  Filled 2019-05-30 (×2): qty 8.8

## 2019-05-30 MED ORDER — INSULIN ASPART 100 UNIT/ML ~~LOC~~ SOLN
0.0000 [IU] | SUBCUTANEOUS | Status: DC
  Administered 2019-06-02 (×4): 1 [IU] via SUBCUTANEOUS
  Administered 2019-06-03: 2 [IU] via SUBCUTANEOUS
  Administered 2019-06-04 – 2019-06-06 (×10): 1 [IU] via SUBCUTANEOUS
  Administered 2019-06-06 (×2): 2 [IU] via SUBCUTANEOUS
  Administered 2019-06-06 – 2019-06-14 (×18): 1 [IU] via SUBCUTANEOUS

## 2019-05-30 MED ORDER — FAMOTIDINE IN NACL 20-0.9 MG/50ML-% IV SOLN
20.0000 mg | INTRAVENOUS | Status: DC
  Administered 2019-05-30 – 2019-06-03 (×5): 20 mg via INTRAVENOUS
  Filled 2019-05-30 (×5): qty 50

## 2019-05-30 MED ORDER — LORAZEPAM 2 MG/ML IJ SOLN
0.5000 mg | Freq: Once | INTRAMUSCULAR | Status: AC
  Administered 2019-05-30: 0.5 mg via INTRAVENOUS
  Filled 2019-05-30: qty 1

## 2019-05-30 MED ORDER — SODIUM CHLORIDE 0.9% FLUSH
3.0000 mL | INTRAVENOUS | Status: DC | PRN
  Administered 2019-06-03: 3 mL via INTRAVENOUS
  Filled 2019-05-30: qty 3

## 2019-05-30 MED ORDER — FAMOTIDINE 20 MG PO TABS
20.0000 mg | ORAL_TABLET | Freq: Every day | ORAL | Status: DC
  Filled 2019-05-30: qty 1

## 2019-05-30 MED ORDER — PANTOPRAZOLE SODIUM 40 MG PO TBEC
40.0000 mg | DELAYED_RELEASE_TABLET | Freq: Every day | ORAL | Status: DC
  Filled 2019-05-30: qty 1

## 2019-05-30 MED ORDER — METOPROLOL TARTRATE 25 MG PO TABS
25.0000 mg | ORAL_TABLET | Freq: Two times a day (BID) | ORAL | Status: DC
  Filled 2019-05-30: qty 1

## 2019-05-30 MED ORDER — INSULIN ASPART 100 UNIT/ML ~~LOC~~ SOLN: 0.0000 [IU] | Freq: Three times a day (TID) | SUBCUTANEOUS | Status: DC

## 2019-05-30 MED ORDER — FUROSEMIDE 10 MG/ML IJ SOLN
40.0000 mg | Freq: Once | INTRAMUSCULAR | Status: AC
  Administered 2019-05-30: 40 mg via INTRAVENOUS
  Filled 2019-05-30: qty 4

## 2019-05-30 MED ORDER — FUROSEMIDE 40 MG PO TABS: 40.0000 mg | ORAL_TABLET | Freq: Two times a day (BID) | ORAL | Status: DC

## 2019-05-30 MED ORDER — PRAVASTATIN SODIUM 40 MG PO TABS
80.0000 mg | ORAL_TABLET | Freq: Every day | ORAL | Status: DC
  Filled 2019-05-30: qty 2

## 2019-05-30 MED ORDER — ALBUTEROL SULFATE (2.5 MG/3ML) 0.083% IN NEBU: 2.5000 mg | INHALATION_SOLUTION | Freq: Four times a day (QID) | RESPIRATORY_TRACT | Status: DC | PRN

## 2019-05-30 MED ORDER — ALLOPURINOL 100 MG PO TABS
100.0000 mg | ORAL_TABLET | Freq: Every day | ORAL | Status: DC
  Administered 2019-06-04 – 2019-06-09 (×6): 100 mg via ORAL
  Filled 2019-05-30 (×9): qty 1

## 2019-05-30 MED ORDER — ISOSORB DINITRATE-HYDRALAZINE 20-37.5 MG PO TABS
1.0000 | ORAL_TABLET | Freq: Three times a day (TID) | ORAL | Status: DC
  Filled 2019-05-30 (×4): qty 1

## 2019-05-30 MED ORDER — SODIUM CHLORIDE 0.9 % IV SOLN
INTRAVENOUS | Status: DC
  Administered 2019-05-30 (×2): via INTRAVENOUS

## 2019-05-30 MED ORDER — SODIUM CHLORIDE 0.9% FLUSH
3.0000 mL | Freq: Two times a day (BID) | INTRAVENOUS | Status: DC
  Administered 2019-05-30 – 2019-06-11 (×13): 3 mL via INTRAVENOUS

## 2019-05-30 MED ORDER — ALBUTEROL SULFATE HFA 108 (90 BASE) MCG/ACT IN AERS: 1.0000 | INHALATION_SPRAY | RESPIRATORY_TRACT | Status: DC | PRN

## 2019-05-30 MED ORDER — SODIUM CHLORIDE 0.9 % IV SOLN
250.0000 mL | INTRAVENOUS | Status: DC | PRN
  Administered 2019-06-08 – 2019-06-09 (×2): 250 mL via INTRAVENOUS

## 2019-05-30 MED ORDER — MORPHINE SULFATE (PF) 2 MG/ML IV SOLN
2.0000 mg | INTRAVENOUS | Status: DC | PRN
  Administered 2019-05-30: 2 mg via INTRAVENOUS
  Filled 2019-05-30: qty 1

## 2019-05-30 NOTE — ED Notes (Signed)
Pt stated she only took one sip of orange juice that was given at bedside. Stated it hurts her throat for her to swallow

## 2019-05-30 NOTE — H&P (Addendum)
History and Physical    JAMETTA MOOREHEAD JSE:[id number] DOB: March 28, 1946 DOA: 05/30/2019  PCP: Nolene Ebbs, MD   Patient coming from: Home    Chief Complaint:   HPI: Kathleen [name] is a 73 y.o. female with medical history significant of hypertension, diastolic congestive heart failure, CKD stage IV, coronary artery disease, diabetes mellitus, paroxysmal A. fib, GI bleed who was brought to the emergency department from home for the evaluation of persistent nausea, vomiting, abdominal pain and diarrhea.  Patient is a poor historian. Patient states she has been having above symptoms for last 2 weeks.  She has been unable to swallow both solid and liquid foods.  She gags when she attempts to take the food by mouth.  Patient lives by herself but her family live nearby.  Her sister was attempting to feed her with a spoon for last few days because she was not able to swallow.  She was seen by Dr. Benson Norway on 6/12 and was planned for endoscopy and colonoscopy in 6/90.  She also has history of GI bleed. Patient seen and examined the bedside.  Currently she is hemodynamically stable.  Appears weak.  She has not vomited since admission.  Complains of generalized abdominal pain. She denied any chest pain, shortness of breath, palpitations, dysuria, fever, chills or cough.  COVID-19 screening test negative.  ED Course: She was found to be hypoglycemic on arrival with blood glucose of 66.  Lipase was mildly elevated.  Bilirubin elevated at 5.6.  Elevated BNP.  Chest x-ray showed cardiomegaly with increase in mild edema  Review of Systems: As per HPI otherwise 10 point review of systems negative.    Past Medical History:  Diagnosis Date  . Arthritis   . Blood transfusion    no side affects  . CHF (congestive heart failure) ([place])   . Coronary artery disease    Cypher stent to the RCA in 2003  . Diabetes mellitus    Borderline  . GERD (gastroesophageal reflux disease)   . Hypertension   . Shortness  of breath   . Sleep apnea     Past Surgical History:  Procedure Laterality Date  . BIOPSY  06/06/2018   Procedure: BIOPSY;  Surgeon: Otis Brace, MD;  Location: [facility];  Service: Gastroenterology;;  . CARDIOVERSION N/A 03/17/2015   Procedure: CARDIOVERSION;  Surgeon: Sueanne Margarita, MD;  Location: [facility];  Service: Cardiovascular;  Laterality: N/A;  . CHOLECYSTECTOMY    . CORONARY ANGIOPLASTY WITH STENT PLACEMENT    . ENTEROSCOPY N/A 06/28/2018   Procedure: ENTEROSCOPY;  Surgeon: Carol Ada, MD;  Location: [facility];  Service: Endoscopy;  Laterality: N/A;  . ENTEROSCOPY N/A 09/12/2018   Procedure: ENTEROSCOPY;  Surgeon: Carol Ada, MD;  Location: [facility];  Service: Endoscopy;  Laterality: N/A;  . ENTEROSCOPY N/A 12/21/2018   Procedure: ENTEROSCOPY;  Surgeon: Carol Ada, MD;  Location: [facility];  Service: Endoscopy;  Laterality: N/A;  . ENTEROSCOPY N/A 02/03/2019   Procedure: ENTEROSCOPY;  Surgeon: Ladene Artist, MD;  Location: [facility];  Service: Endoscopy;  Laterality: N/A;  . ESOPHAGOGASTRODUODENOSCOPY (EGD) WITH PROPOFOL N/A 06/06/2018   Procedure: ESOPHAGOGASTRODUODENOSCOPY (EGD) WITH PROPOFOL;  Surgeon: Otis Brace, MD;  Location: [facility];  Service: Gastroenterology;  Laterality: N/A;  . GIVENS CAPSULE STUDY N/A 06/25/2018   Procedure: GIVENS CAPSULE STUDY;  Surgeon: Carol Ada, MD;  Location: [facility];  Service: Endoscopy;  Laterality: N/A;  . HOT HEMOSTASIS N/A 06/06/2018   Procedure: HOT HEMOSTASIS (ARGON PLASMA COAGULATION/BICAP);  Surgeon: Otis Brace, MD;  Location: [facility];  Service: Gastroenterology;  Laterality: N/A;  . HOT HEMOSTASIS N/A 06/28/2018   Procedure: HOT HEMOSTASIS (ARGON PLASMA COAGULATION/BICAP);  Surgeon: Carol Ada, MD;  Location: [facility];  Service: Endoscopy;  Laterality: N/A;  . HOT HEMOSTASIS N/A 12/21/2018   Procedure: HOT HEMOSTASIS (ARGON PLASMA COAGULATION/BICAP);  Surgeon: Carol Ada, MD;  Location: [facility];  Service: Endoscopy;  Laterality: N/A;  . HOT HEMOSTASIS N/A 02/03/2019   Procedure: HOT HEMOSTASIS (ARGON PLASMA COAGULATION/BICAP);  Surgeon: Ladene Artist, MD;  Location: [facility];  Service: Endoscopy;  Laterality: N/A;  . TOTAL KNEE ARTHROPLASTY       reports that she quit smoking about 5 years ago. Her smoking use included cigarettes. She has a 9.00 pack-year smoking history. She has never used smokeless tobacco. She reports that she does not drink alcohol or use drugs.  Allergies  Allergen Reactions  . Penicillins Hives and Shortness Of Breath    Tolerates Ceftin, Ceftriaxone, and Cefepime Has patient had a PCN reaction causing immediate rash, facial/tongue/throat swelling, SOB or lightheadedness with hypotension: Y Has patient had a PCN reaction causing severe rash involving mucus membranes or skin necrosis: Y Has patient had a PCN reaction that required hospitalization: Y Has patient had a PCN reaction occurring within the last 10 years: N     Family History  Problem Relation Age of Onset  . Hypertension Father   . Heart disease Father   . Gout Father   . Arthritis Father   . Heart attack Father   . Heart attack Brother   . Hypertension Brother   . Hypertension Sister   . Stroke Neg Hx      Prior to Admission medications   Medication Sig Start Date End Date Taking? Authorizing Provider  albuterol (PROVENTIL HFA;VENTOLIN HFA) 108 (90 Base) MCG/ACT inhaler Inhale 1-2 puffs into the lungs every 4 (four) hours as needed for wheezing or shortness of breath. 12/06/17   Mariel Aloe, MD  albuterol (PROVENTIL) (2.5 MG/3ML) 0.083% nebulizer solution Take 2.5 mg by nebulization every 6 (six) hours as needed for wheezing or shortness of breath.    [provider]  allopurinol (ZYLOPRIM) 100 MG tablet Take 100 mg by mouth daily.    [provider]  colchicine 0.6 MG tablet Take 0.6 mg by mouth daily as needed (gout).     [provider]  cyclobenzaprine (FLEXERIL) 10 MG tablet Take 10 mg by mouth 2 (two) times daily as needed for muscle spasms.     [provider]  famotidine (PEPCID) 20 MG tablet Take 20 mg by mouth at bedtime.  02/04/18   [provider]  furosemide (LASIX) 80 MG tablet Take 0.5 tablets (40 mg total) by mouth 2 (two) times daily. Resume only on 12/26/2018 Patient taking differently: Take 40-80 mg by mouth as directed. 80 MG in AM and 40 MG in PM 12/26/18   Bonnielee Haff, MD  isosorbide-hydrALAZINE (BIDIL) 20-37.5 MG tablet Take 1 tablet by mouth 3 (three) times daily. 06/10/18   Eugenie Filler, MD  LANTUS SOLOSTAR 100 UNIT/ML Solostar Pen Inject 10 Units into the skin daily. Please take only if your blood glucose levels are greater than 180. 12/22/18   Bonnielee Haff, MD  metoprolol tartrate (LOPRESSOR) 50 MG tablet Take 0.5 tablets (25 mg total) by mouth 2 (two) times daily. 02/05/19   Samuella Cota, MD  pantoprazole (PROTONIX) 40 MG tablet Take 1 tablet (40 mg total)  by mouth daily. 06/07/18 06/07/19  Eugenie Filler, MD  pravastatin (PRAVACHOL) 80 MG tablet Take 80 mg by mouth daily.    [provider]  SYMBICORT 80-4.5 MCG/ACT inhaler Inhale 2 puffs into the lungs 2 (two) times daily. 02/02/18   [provider]    Physical Exam: Vitals:   05/30/19 0600 05/30/19 0630 05/30/19 0726 05/30/19 0755  BP: 120/66 125/68 124/75 135/80  Pulse: (!) 114 (!) 118  (!) 104  Resp: 18 (!) 22 (!) 21 16  Temp:    97.7 F (36.5 C)  TempSrc:    Oral  SpO2: 96% 98%  100%  Height:        Constitutional: weak appearing,not in acute distress Vitals:   05/30/19 0600 05/30/19 0630 05/30/19 0726 05/30/19 0755  BP: 120/66 125/68 124/75 135/80  Pulse: (!) 114 (!) 118  (!) 104  Resp: 18 (!) 22 (!) 21 16  Temp:    97.7 F (36.5 C)  TempSrc:    Oral  SpO2: 96% 98%  100%  Height:       Eyes: PERRL, lids and conjunctivae normal ENMT: Mucous membranes are  dry. Posterior pharynx clear of any exudate or lesions.Normal dentition.  Neck: normal, supple, no masses, no thyromegaly Respiratory: clear to auscultation bilaterally, no wheezing, no crackles. Normal respiratory effort. No accessory muscle use.  Cardiovascular: A. fib, no murmurs / rubs / gallops. No extremity edema. 2+ pedal pulses. No carotid bruits. Elevated JVD Abdomen: mild generalized tenderness, no masses palpated. No hepatosplenomegaly. Bowel sounds positive.  Musculoskeletal: no clubbing / cyanosis. No joint deformity upper and lower extremities. Good ROM, no contractures. Normal muscle tone.  Skin: no rashes, lesions, ulcers. No induration Neurologic: CN 2-12 grossly intact. Sensation intact, DTR normal. Strength 5/5 in all 4.  Psychiatric: Normal judgment and insight. Alert and oriented x 3. Normal mood.   Foley Catheter:None  Labs on Admission: I have personally reviewed following labs and imaging studies  CBC: Recent Labs  Lab 05/30/19 0417  WBC 5.1  NEUTROABS 3.5  HGB 10.9*  HCT 36.2  MCV 79.7*  PLT 885*   Basic Metabolic Panel: Recent Labs  Lab 05/30/19 0417  NA 145  K 3.7  CL 103  CO2 22  GLUCOSE 76  BUN 85*  CREATININE 3.45*  CALCIUM 10.0   GFR: CrCl cannot be calculated (Unknown ideal weight.). Liver Function Tests: Recent Labs  Lab 05/30/19 0417  AST 66*  ALT 40  ALKPHOS 91  BILITOT 5.6*  PROT 7.7  ALBUMIN 4.1   Recent Labs  Lab 05/30/19 0417  LIPASE 127*   No results for input(s): AMMONIA in the last 168 hours. Coagulation Profile: No results for input(s): INR, PROTIME in the last 168 hours. Cardiac Enzymes: Recent Labs  Lab 05/30/19 0417  TROPONINI 0.06*   BNP (last 3 results) No results for input(s): PROBNP in the last 8760 hours. HbA1C: No results for input(s): HGBA1C in the last 72 hours. CBG: Recent Labs  Lab 05/30/19 0424 05/30/19 0603 05/30/19 0700  GLUCAP 66* 69* 142*   Lipid Profile: No results for input(s):  CHOL, HDL, LDLCALC, TRIG, CHOLHDL, LDLDIRECT in the last 72 hours. Thyroid Function Tests: No results for input(s): TSH, T4TOTAL, FREET4, T3FREE, THYROIDAB in the last 72 hours. Anemia Panel: No results for input(s): VITAMINB12, FOLATE, FERRITIN, TIBC, IRON, RETICCTPCT in the last 72 hours. Urine analysis:    Component Value Date/Time   COLORURINE STRAW (A) 09/11/2018 2023   APPEARANCEUR CLEAR 09/11/2018 2023  LABSPEC 1.005 09/11/2018 2023   PHURINE 7.0 09/11/2018 2023   GLUCOSEU NEGATIVE 09/11/2018 2023   HGBUR MODERATE (A) 09/11/2018 2023   BILIRUBINUR NEGATIVE 09/11/2018 2023   KETONESUR NEGATIVE 09/11/2018 2023   PROTEINUR 30 (A) 09/11/2018 2023   UROBILINOGEN 1.0 08/15/2015 0915   NITRITE NEGATIVE 09/11/2018 2023   LEUKOCYTESUR NEGATIVE 09/11/2018 2023    Radiological Exams on Admission: Dg Chest Portable 1 View  Result Date: 05/30/2019 CLINICAL DATA:  Cough and shortness of breath. EXAM: PORTABLE CHEST 1 VIEW COMPARISON:  Two-view chest x-ray the heart is enlarged. Atherosclerotic changes 12/19/2018 are FINDINGS: Noted at the aortic arch. Pulmonary arteries are enlarged. Mild edema is present. No significant effusions are evident. There is no significant airspace consolidation. IMPRESSION: 1. Cardiomegaly with increase in mild edema. Findings are consistent with congestive heart failure. 2. Aortic atherosclerosis. Electronically Signed   By: San Morelle M.D.   On: 05/30/2019 04:50     Assessment/Plan Principal Problem:   Dysphagia Active Problems:   Essential hypertension   Diabetes mellitus type 2, controlled (HCC)   HLD (hyperlipidemia)   CKD (chronic kidney disease) stage 4, GFR 15-29 ml/min (HCC)   Chronic diastolic CHF (congestive heart failure) ([facility])   Atrial fibrillation, chronic   GI bleed  Dysphagia: She has not tolerated solid or liquid food and was persistently vomiting.  Also complaining of generalized abdominal pain. She follows with Dr. Benson Norway, GI  and was planned for colonoscopy and endoscopy on 6/19.  I will request for GI consultation with Dr. Benson Norway.  She will also be evaluated by speech therapy.  Persistent nausea and vomiting, diarrhea: Continue antiemetics.  Check GI pathogen panel  Generalized abdominal pain/elevated bilirubin/elevated lipase: Mildly elevated lipase.  Will get CT abdomen/pelvis without contrast.  H/O GI bleed:She has history of angiodysplastic lesions of the stomach and duodenal bulb.  Continue Protonix.   Has history of acute blood loss anemia.  She has been transfused in the past due to GI bleed.  Currently hemoglobin is stable.  CKD stage IV: Follows with [facility].  Currently kidney function on baseline.  Continue to monitor BMP.  Chronic diastolic CHF: Elevated BNP on presentation .Chest x-ray showed mild pulmonary congestion.  She has mild bilateral lower extremity edema, elevated JVD.  She was given a dose of IV Lasix in the emergency department.  Continue to monitor input and output.  Daily weight.  She follows with Dr. Percival Spanish, cardiology. Echocardiogram done on 03/2018 shows ejection fraction 66 -65%, high ventricular filling pressure.  Elevated troponin: Mild.No chest pain.Most likely associated with her underlying CHF.No ischemic changes on her EKG.  Hypertension: Currently blood pressure stable.  Continue her home meds  Diabetes type 2: Continue sliding scale insulin only.  On Lantus at home.  Was hypoglycemic on presentation most likely due to decreased oral intake.  Hyperlipidemia: Continue statin  History of gout: Continue allopurinol  Paroxysmal A. fib: Currently heart rate is controlled.  On metoprolol for rate control.  Not on anticoagulation due to history of GI bleed.  She was on anticoagulation with Eliquis  in the past.   Addendum: CT abdomen/pelvis shows mild acute pancreatitis.  Though she has congestive heart failure, she looks intravascularly depleted.  I will start her on  gentle IV fluids which we we will be cautious with.Will keep her NPO   Severity of Illness: The appropriate patient status for this patient is OBSERVATION.    DVT prophylaxis: SCD Code Status: Full Family Communication: None pain at  the bedside Consults called: Dr. Benson Norway, GI     Shelly Coss MD [facility] Pager [phone]  If 7PM-7AM, please contact night-coverage [email or web address] Password [facility]  05/30/2019, 8:19 AM

## 2019-05-30 NOTE — ED Provider Notes (Signed)
[facility] DEPT Provider Note   CSN: [id number] Arrival date & time: 05/30/19  0347    History   Chief Complaint Chief Complaint  Patient presents with  . Emesis  . Abdominal Pain    HPI Kathleen [name] is a 73 y.o. female with a history of HTN, and STEMI, diabetes mellitus type 2, CKD stage IV, chronic diastolic heart failure, chronic atrial fibrillation, and arteriovenous malformation of the duodenum who presents to the emergency department with a chief complaint of vomiting.  The patient reports she had 3 episodes of posttussive emesis earlier tonight.  She reports that she has been having upper abdominal pain for the last 4 days.  She reports the pain is severe and constant.  She is unable to characterize the pain.  She reports associated diarrhea, but reports this is been ongoing for the last 2 weeks.  Reports that she has had some swelling in her legs over the last few days. She denies shortness of breath or cheat pain. She denies urinary symptoms, constipation, fever, chills.   Patient is a poor historian.   Spoke with the patient's sister, Martin Majestic, he reports that the patient has had diarrhea over the last 2 weeks.  She reports that the patient has developed "gagging" with both solids and liquids.  She reports that she has been able to spoon feed her a couple of bites of very finely crushed ice over the last few days, but she has been able to swallow any water.  She reports that she did tolerate 1-2 spoonfuls of minestrone soup broth several days ago, but otherwise has not tolerated food.  She reports that she also attempted to give her Pedialyte to avoid dehydration, but she gagged and was unable to swallow it. She was seen by Dr. Benson Norway on 6/12 and was told that her bilirubin was elevated.  She is scheduled for an outpatient endoscopy and colonoscopy on 6/19.   Sister does report that the patient has had a productive cough and has mentioned within the  last few days that she has felt short of breath.  She denies chest pain.  She reports that the episodes of "vomitting" tonight occurred after a coughing episode and contained "green sputum", which concerned family and brought her to the ER tonight.      The history is provided by the patient. No language interpreter was used.    Past Medical History:  Diagnosis Date  . Arthritis   . Blood transfusion    no side affects  . CHF (congestive heart failure) ([place])   . Coronary artery disease    Cypher stent to the RCA in 2003  . Diabetes mellitus    Borderline  . GERD (gastroesophageal reflux disease)   . Hypertension   . Shortness of breath   . Sleep apnea     Patient Active Problem List   Diagnosis Date Noted  . Dysphagia 05/30/2019  . AVM (arteriovenous malformation) of duodenum, acquired   . AVM (arteriovenous malformation) of stomach, acquired with hemorrhage   . AKI (acute kidney injury) ([place]) 02/02/2019  . Symptomatic anemia 12/19/2018  . GIB (gastrointestinal bleeding) 09/12/2018  . GI bleed 09/11/2018  . Malnutrition of moderate degree 06/26/2018  . Pressure injury of skin 06/26/2018  . Hypotension due to hypovolemia 06/22/2018  . Acute metabolic encephalopathy 54/65/6812  . Transaminitis 06/22/2018  . Anemia due to GI blood loss 06/22/2018  . Epigastric pain 06/22/2018  . Decreased oral intake 06/22/2018  .  Hypovolemic shock ([place]) 06/22/2018  . Duodenitis 06/07/2018  . Chronic gastritis 06/07/2018  . Abnormal LFTs   . Upper GI bleed   . Melena 06/04/2018  . Atrial fibrillation, chronic 06/04/2018  . SOB (shortness of breath) 03/08/2018  . SIRS (systemic inflammatory response syndrome) ([place]) 12/04/2017  . Hypothermia 12/04/2017  . Elevated LFTs 12/04/2017  . Sepsis ([place]) 12/04/2017  . Atrial flutter ([place]) 03/17/2015  . NSTEMI (non-ST elevated myocardial infarction) ([place])   . Chronic diastolic CHF (congestive heart failure) ([place])   . Type 2 diabetes mellitus  with hypoglycemia without coma ([place])   . Paroxysmal atrial fibrillation ([place]) 01/25/2015  . Non-ST elevation MI (NSTEMI) ([place]) 01/23/2015  . Diabetes mellitus type 2, controlled ([place]) 01/23/2015  . HLD (hyperlipidemia)   . CKD (chronic kidney disease) stage 4, GFR 15-29 ml/min (HCC)   . Acute congestive heart failure ([place]) 09/17/2014  . Asthma 03/26/2014  . GERD (gastroesophageal reflux disease) 03/26/2014  . Smoker 03/26/2014  . Postmenopausal bleeding 01/10/2014  . Carotid stenosis 01/04/2012  . Carpal tunnel syndrome on left 11/26/2011  . Transient ischemic attack on medication 11/02/2011  . Hyperlipidemia with target LDL less than 70 09/17/2008  . OBESITY 09/17/2008  . Essential hypertension 09/17/2008  . Coronary atherosclerosis 09/17/2008    Past Surgical History:  Procedure Laterality Date  . BIOPSY  06/06/2018   Procedure: BIOPSY;  Surgeon: Otis Brace, MD;  Location: [facility];  Service: Gastroenterology;;  . CARDIOVERSION N/A 03/17/2015   Procedure: CARDIOVERSION;  Surgeon: Sueanne Margarita, MD;  Location: [facility];  Service: Cardiovascular;  Laterality: N/A;  . CHOLECYSTECTOMY    . CORONARY ANGIOPLASTY WITH STENT PLACEMENT    . ENTEROSCOPY N/A 06/28/2018   Procedure: ENTEROSCOPY;  Surgeon: Carol Ada, MD;  Location: [facility];  Service: Endoscopy;  Laterality: N/A;  . ENTEROSCOPY N/A 09/12/2018   Procedure: ENTEROSCOPY;  Surgeon: Carol Ada, MD;  Location: [facility];  Service: Endoscopy;  Laterality: N/A;  . ENTEROSCOPY N/A 12/21/2018   Procedure: ENTEROSCOPY;  Surgeon: Carol Ada, MD;  Location: [facility];  Service: Endoscopy;  Laterality: N/A;  . ENTEROSCOPY N/A 02/03/2019   Procedure: ENTEROSCOPY;  Surgeon: Ladene Artist, MD;  Location: [facility];  Service: Endoscopy;  Laterality: N/A;  . ESOPHAGOGASTRODUODENOSCOPY (EGD) WITH PROPOFOL N/A 06/06/2018   Procedure: ESOPHAGOGASTRODUODENOSCOPY (EGD) WITH PROPOFOL;  Surgeon: Otis Brace,  MD;  Location: [facility];  Service: Gastroenterology;  Laterality: N/A;  . GIVENS CAPSULE STUDY N/A 06/25/2018   Procedure: GIVENS CAPSULE STUDY;  Surgeon: Carol Ada, MD;  Location: [facility];  Service: Endoscopy;  Laterality: N/A;  . HOT HEMOSTASIS N/A 06/06/2018   Procedure: HOT HEMOSTASIS (ARGON PLASMA COAGULATION/BICAP);  Surgeon: Otis Brace, MD;  Location: [facility];  Service: Gastroenterology;  Laterality: N/A;  . HOT HEMOSTASIS N/A 06/28/2018   Procedure: HOT HEMOSTASIS (ARGON PLASMA COAGULATION/BICAP);  Surgeon: Carol Ada, MD;  Location: [facility];  Service: Endoscopy;  Laterality: N/A;  . HOT HEMOSTASIS N/A 12/21/2018   Procedure: HOT HEMOSTASIS (ARGON PLASMA COAGULATION/BICAP);  Surgeon: Carol Ada, MD;  Location: [facility];  Service: Endoscopy;  Laterality: N/A;  . HOT HEMOSTASIS N/A 02/03/2019   Procedure: HOT HEMOSTASIS (ARGON PLASMA COAGULATION/BICAP);  Surgeon: Ladene Artist, MD;  Location: [facility];  Service: Endoscopy;  Laterality: N/A;  . TOTAL KNEE ARTHROPLASTY       OB History    Gravida  9   Para  7   Term  5   Preterm  2   AB  2  Living  5     SAB  2   TAB  0   Ectopic  0   Multiple  0   Live Births  7            Home Medications    Prior to Admission medications   Medication Sig Start Date End Date Taking? Authorizing Provider  albuterol (PROVENTIL HFA;VENTOLIN HFA) 108 (90 Base) MCG/ACT inhaler Inhale 1-2 puffs into the lungs every 4 (four) hours as needed for wheezing or shortness of breath. 12/06/17   Mariel Aloe, MD  albuterol (PROVENTIL) (2.5 MG/3ML) 0.083% nebulizer solution Take 2.5 mg by nebulization every 6 (six) hours as needed for wheezing or shortness of breath.    [provider]  allopurinol (ZYLOPRIM) 100 MG tablet Take 100 mg by mouth daily.    [provider]  colchicine 0.6 MG tablet Take 0.6 mg by mouth daily as needed (gout).    [provider]   cyclobenzaprine (FLEXERIL) 10 MG tablet Take 10 mg by mouth 2 (two) times daily as needed for muscle spasms.     [provider]  famotidine (PEPCID) 20 MG tablet Take 20 mg by mouth at bedtime.  02/04/18   [provider]  furosemide (LASIX) 80 MG tablet Take 0.5 tablets (40 mg total) by mouth 2 (two) times daily. Resume only on 12/26/2018 Patient taking differently: Take 40-80 mg by mouth as directed. 80 MG in AM and 40 MG in PM 12/26/18   Bonnielee Haff, MD  isosorbide-hydrALAZINE (BIDIL) 20-37.5 MG tablet Take 1 tablet by mouth 3 (three) times daily. 06/10/18   Eugenie Filler, MD  LANTUS SOLOSTAR 100 UNIT/ML Solostar Pen Inject 10 Units into the skin daily. Please take only if your blood glucose levels are greater than 180. 12/22/18   Bonnielee Haff, MD  metoprolol tartrate (LOPRESSOR) 50 MG tablet Take 0.5 tablets (25 mg total) by mouth 2 (two) times daily. 02/05/19   Samuella Cota, MD  pantoprazole (PROTONIX) 40 MG tablet Take 1 tablet (40 mg total) by mouth daily. 06/07/18 06/07/19  Eugenie Filler, MD  pravastatin (PRAVACHOL) 80 MG tablet Take 80 mg by mouth daily.    [provider]  SYMBICORT 80-4.5 MCG/ACT inhaler Inhale 2 puffs into the lungs 2 (two) times daily. 02/02/18   [provider]    Family History Family History  Problem Relation Age of Onset  . Hypertension Father   . Heart disease Father   . Gout Father   . Arthritis Father   . Heart attack Father   . Heart attack Brother   . Hypertension Brother   . Hypertension Sister   . Stroke Neg Hx     Social History Social History   Tobacco Use  . Smoking status: Former Smoker    Packs/day: 0.30    Years: 30.00    Pack years: 9.00    Types: Cigarettes    Quit date: 01/18/2014    Years since quitting: 5.3  . Smokeless tobacco: Never Used  . Tobacco comment: Smoking cessation requested   Substance Use Topics  . Alcohol use: No  . Drug use: No     Allergies   Penicillins    Review of Systems Review of Systems  Constitutional: Negative for activity change and fever.  HENT: Positive for trouble swallowing. Negative for congestion.   Eyes: Negative for visual disturbance.  Respiratory: Positive for cough and shortness of breath. Negative for wheezing.   Cardiovascular: Positive  for leg swelling. Negative for chest pain and palpitations.  Gastrointestinal: Positive for abdominal pain, diarrhea, nausea and vomiting. Negative for blood in stool and constipation.  Genitourinary: Negative for dysuria, frequency, urgency and vaginal bleeding.  Musculoskeletal: Negative for back pain and myalgias.  Skin: Negative for rash.  Allergic/Immunologic: Negative for immunocompromised state.  Neurological: Negative for dizziness, syncope, weakness, numbness and headaches.  Psychiatric/Behavioral: Negative for confusion.     Physical Exam Updated Vital Signs BP 124/75   Pulse (!) 118   Temp (!) 97.5 F (36.4 C) (Rectal)   Resp (!) 21   Ht 5\' 4"  (1.626 m)   SpO2 98%   BMI 35.12 kg/m   Physical Exam Vitals signs and nursing note reviewed.  Constitutional:      General: She is not in acute distress.    Appearance: She is not toxic-appearing.  HENT:     Head: Normocephalic.     Mouth/Throat:     Mouth: Mucous membranes are moist.     Pharynx: No oropharyngeal exudate or posterior oropharyngeal erythema.  Eyes:     Conjunctiva/sclera: Conjunctivae normal.  Neck:     Musculoskeletal: Neck supple.  Cardiovascular:     Rate and Rhythm: Tachycardia present. Rhythm irregularly irregular.     Heart sounds: No murmur. No friction rub. No gallop.   Pulmonary:     Effort: Pulmonary effort is normal. No respiratory distress.     Breath sounds: No stridor. No wheezing, rhonchi or rales.  Chest:     Chest wall: No tenderness.  Abdominal:     General: Bowel sounds are normal. There is no distension.     Palpations: Abdomen is soft. There is no mass.     Tenderness:  There is abdominal tenderness in the right upper quadrant, epigastric area and left upper quadrant. There is no right CVA tenderness, left CVA tenderness, guarding or rebound. Negative signs include Murphy's sign, Rovsing's sign and psoas sign.     Hernia: No hernia is present.  Musculoskeletal:     Right lower leg: Edema present.     Left lower leg: Edema present.     Comments: Trace bilateral lower extremity pitting edema.  Skin:    General: Skin is warm.     Findings: No rash.  Neurological:     Mental Status: She is alert.  Psychiatric:        Behavior: Behavior normal.      ED Treatments / Results  Labs (all labs ordered are listed, but only abnormal results are displayed) Labs Reviewed  CBC WITH DIFFERENTIAL/PLATELET - Abnormal; Notable for the following components:      Result Value   Hemoglobin 10.9 (*)    MCV 79.7 (*)    MCH 24.0 (*)    RDW 26.0 (*)    Platelets 139 (*)    nRBC 0.6 (*)    All other components within normal limits  COMPREHENSIVE METABOLIC PANEL - Abnormal; Notable for the following components:   BUN 85 (*)    Creatinine, Ser 3.45 (*)    AST 66 (*)    Total Bilirubin 5.6 (*)    GFR calc non Af Amer 12 (*)    GFR calc Af Amer 14 (*)    Anion gap 20 (*)    All other components within normal limits  TROPONIN I - Abnormal; Notable for the following components:   Troponin I 0.06 (*)    All other components within normal limits  LIPASE, BLOOD -  Abnormal; Notable for the following components:   Lipase 127 (*)    All other components within normal limits  BRAIN NATRIURETIC PEPTIDE - Abnormal; Notable for the following components:   B Natriuretic Peptide 1,478.3 (*)    All other components within normal limits  CBG MONITORING, ED - Abnormal; Notable for the following components:   Glucose-Capillary 66 (*)    All other components within normal limits  CBG MONITORING, ED - Abnormal; Notable for the following components:   Glucose-Capillary 69 (*)     All other components within normal limits  CBG MONITORING, ED - Abnormal; Notable for the following components:   Glucose-Capillary 142 (*)    All other components within normal limits  SARS CORONAVIRUS 2 (HOSPITAL ORDER, [facility])  CBG MONITORING, ED    EKG EKG Interpretation  Date/Time:  Wednesday May 30 2019 04:42:53 EDT Ventricular Rate:  107 PR Interval:    QRS Duration: 87 QT Interval:  371 QTC Calculation: 495 R Axis:   92 Text Interpretation:  Atrial fibrillation Ventricular premature complex Aberrant conduction of SV complex(es) Anterior infarct, old Borderline repolarization abnormality Baseline wander in lead(s) V2 Confirmed by Randal Buba, April ([id number]) on 05/30/2019 5:01:45 AM   Radiology Dg Chest Portable 1 View  Result Date: 05/30/2019 CLINICAL DATA:  Cough and shortness of breath. EXAM: PORTABLE CHEST 1 VIEW COMPARISON:  Two-view chest x-ray the heart is enlarged. Atherosclerotic changes 12/19/2018 are FINDINGS: Noted at the aortic arch. Pulmonary arteries are enlarged. Mild edema is present. No significant effusions are evident. There is no significant airspace consolidation. IMPRESSION: 1. Cardiomegaly with increase in mild edema. Findings are consistent with congestive heart failure. 2. Aortic atherosclerosis. Electronically Signed   By: San Morelle M.D.   On: 05/30/2019 04:50    Procedures Procedures (including critical care time)  Medications Ordered in ED Medications  sodium chloride flush (NS) 0.9 % injection 3 mL (has no administration in time range)  sodium chloride flush (NS) 0.9 % injection 3 mL (has no administration in time range)  0.9 %  sodium chloride infusion (has no administration in time range)  furosemide (LASIX) injection 40 mg (has no administration in time range)  LORazepam (ATIVAN) injection 0.5 mg (0.5 mg Intravenous Given 05/30/19 0452)  dextrose 50 % solution 50 mL (50 mLs Intravenous Given 05/30/19 4696)      Initial Impression / Assessment and Plan / ED Course  I have reviewed the triage vital signs and the nursing notes.  Pertinent labs & imaging results that were available during my care of the patient were reviewed by me and considered in my medical decision making (see chart for details).        73 year old female with a history of HTN, and STEMI, diabetes mellitus type 2, CKD stage IV, chronic diastolic heart failure, chronic atrial fibrillation, and arteriovenous malformation of the duodenum presenting with vomiting (?) x3 earlier tonight.  Somewhat unclear if the patient had posttussive emesis versus gagging with productive cough versus vomiting.  While in the room, the patient had a coughing episode where she began to gag, but did not vomit.  The patient's sister reports that she had a coughing episode earlier tonight followed by "vomiting" that produced green mucous.  Although the patient denies shortness of breath, family reports that she was complaining of shortness of breath over the last few days.  She has had no chest pain or constitutional symptoms.  She has had 2 weeks of diarrhea and  developed upper abdominal pain 4 days ago.  In addition, she has been having gagging and dysphagia.  Family reports that she cannot even swallow water and has not been able to eat for days.  Patient's CBG is 66 on arrival, likely secondary to decreased p.o. intake.  D50 given for hypoglycemia as patient is not able to tolerate p.o.   She is followed by Dr. Benson Norway with gastroenterology and is scheduled for an endoscopy and colonoscopy later this week. Total bilirubin is 5.6 today and lipase is mildly elevated at 127.  She is having no melena or hematochezia and has had prior cholecystectomy.  Will defer abdominal imaging at this time.  Work-up is also notable for chest x-ray with cardiomegaly with mild increase and edema consistent with congestive heart failure. Troponin is acutely evelated at 0.06,  likely secondary to heart failure she continues to deny chest pain at this time EKG with A. fib.  Rate is controlled and she is not anticoagulated given history of GI bleeds.  BNP is pending.  IV Lasix ordered.  Consulted hospitalist for admission given hypoglycemia in the setting of inability to tolerate p.o. as well as acute on chronic CHF exacerbation. She will likely require GI consult. Dr. Myna Hidalgo has accepted the patient for admission. The patient appears reasonably stabilized for admission considering the current resources, flow, and capabilities available in the ED at this time, and I doubt any other [facility] requiring further screening and/or treatment in the ED prior to admission.  Final Clinical Impressions(s) / ED Diagnoses   Final diagnoses:  Acute on chronic diastolic congestive heart failure (HCC)  Hypoglycemia  Dysphagia, unspecified type    ED Discharge Orders    None       Joanne Gavel, PA-C 05/30/19 0731    Palumbo, April, MD 06/03/19 2329

## 2019-05-30 NOTE — Plan of Care (Signed)
Call bell. Phone in place. Bed alarm set. Pt is aware to call for assistance

## 2019-05-30 NOTE — Progress Notes (Addendum)
Patient complains of pain in back. Pt reports history of back pain. Provider paged to request pain medication. V.O received to given IV Morphine 2 mg every 4 hours prn moderate-severe pain

## 2019-05-30 NOTE — ED Notes (Signed)
ED TO INPATIENT HANDOFF REPORT  ED Nurse Name and Phone #: jon wled   S Name/Age/Gender Kathleen [name] 73 y.o. female Room/Bed: WA09/WA09  Code Status   Code Status: Prior  Home/SNF/Other Home Patient oriented to: self, place, time and situation Is this baseline? Yes   Triage Complete: Triage complete  Chief Complaint Emesis; Trouble Swallowing  Triage Note Pt arrives with abdominal pain that started a week ago, stating she has had 4 episodes of vomiting tonight. Pt stating she has at 3 episodes of diarrhea. Pt states she is scheduled for an endoscopy later this week.    Allergies Allergies  Allergen Reactions  . Penicillins Hives and Shortness Of Breath    Tolerates Ceftin, Ceftriaxone, and Cefepime Has patient had a PCN reaction causing immediate rash, facial/tongue/throat swelling, SOB or lightheadedness with hypotension: Y Has patient had a PCN reaction causing severe rash involving mucus membranes or skin necrosis: Y Has patient had a PCN reaction that required hospitalization: Y Has patient had a PCN reaction occurring within the last 10 years: N     Level of Care/Admitting Diagnosis ED Disposition    ED Disposition Condition [place]: [place] [[id number]]  Level of Care: Telemetry [5]  Admit to tele based on following criteria: Monitor for Ischemic changes  Covid Evaluation: Screening Protocol (No Symptoms)  Diagnosis: Dysphagia [093818]  Admitting Physician: Vianne Bulls [[id number]]  Attending Physician: Vianne Bulls [[id number]]  PT Class (Do Not Modify): Observation [104]  PT Acc Code (Do Not Modify): Observation [10022]       B Medical/Surgery History Past Medical History:  Diagnosis Date  . Arthritis   . Blood transfusion    no side affects  . CHF (congestive heart failure) ([place])   . Coronary artery disease    Cypher stent to the RCA in 2003  . Diabetes mellitus    Borderline  . GERD  (gastroesophageal reflux disease)   . Hypertension   . Shortness of breath   . Sleep apnea    Past Surgical History:  Procedure Laterality Date  . BIOPSY  06/06/2018   Procedure: BIOPSY;  Surgeon: Otis Brace, MD;  Location: [facility];  Service: Gastroenterology;;  . CARDIOVERSION N/A 03/17/2015   Procedure: CARDIOVERSION;  Surgeon: Sueanne Margarita, MD;  Location: [facility];  Service: Cardiovascular;  Laterality: N/A;  . CHOLECYSTECTOMY    . CORONARY ANGIOPLASTY WITH STENT PLACEMENT    . ENTEROSCOPY N/A 06/28/2018   Procedure: ENTEROSCOPY;  Surgeon: Carol Ada, MD;  Location: [facility];  Service: Endoscopy;  Laterality: N/A;  . ENTEROSCOPY N/A 09/12/2018   Procedure: ENTEROSCOPY;  Surgeon: Carol Ada, MD;  Location: [facility];  Service: Endoscopy;  Laterality: N/A;  . ENTEROSCOPY N/A 12/21/2018   Procedure: ENTEROSCOPY;  Surgeon: Carol Ada, MD;  Location: [facility];  Service: Endoscopy;  Laterality: N/A;  . ENTEROSCOPY N/A 02/03/2019   Procedure: ENTEROSCOPY;  Surgeon: Ladene Artist, MD;  Location: [facility];  Service: Endoscopy;  Laterality: N/A;  . ESOPHAGOGASTRODUODENOSCOPY (EGD) WITH PROPOFOL N/A 06/06/2018   Procedure: ESOPHAGOGASTRODUODENOSCOPY (EGD) WITH PROPOFOL;  Surgeon: Otis Brace, MD;  Location: [facility];  Service: Gastroenterology;  Laterality: N/A;  . GIVENS CAPSULE STUDY N/A 06/25/2018   Procedure: GIVENS CAPSULE STUDY;  Surgeon: Carol Ada, MD;  Location: [facility];  Service: Endoscopy;  Laterality: N/A;  . HOT HEMOSTASIS N/A 06/06/2018   Procedure: HOT HEMOSTASIS (ARGON PLASMA COAGULATION/BICAP);  Surgeon: Otis Brace, MD;  Location: [facility];  Service: Gastroenterology;  Laterality: N/A;  . HOT HEMOSTASIS N/A 06/28/2018   Procedure: HOT HEMOSTASIS (ARGON PLASMA COAGULATION/BICAP);  Surgeon: Carol Ada, MD;  Location: [facility];  Service: Endoscopy;  Laterality: N/A;  . HOT HEMOSTASIS N/A 12/21/2018   Procedure: HOT  HEMOSTASIS (ARGON PLASMA COAGULATION/BICAP);  Surgeon: Carol Ada, MD;  Location: [facility];  Service: Endoscopy;  Laterality: N/A;  . HOT HEMOSTASIS N/A 02/03/2019   Procedure: HOT HEMOSTASIS (ARGON PLASMA COAGULATION/BICAP);  Surgeon: Ladene Artist, MD;  Location: [facility];  Service: Endoscopy;  Laterality: N/A;  . TOTAL KNEE ARTHROPLASTY       A IV Location/Drains/Wounds Patient Lines/Drains/Airways Status   Active Line/Drains/Airways    Name:   Placement date:   Placement time:   Site:   Days:   Peripheral IV 05/30/19 Right Arm   05/30/19    0429    Arm   less than 1   External Urinary Catheter   02/01/19    2211    -   118   Pressure Injury 06/22/18 Stage II -  Partial thickness loss of dermis presenting as a shallow open ulcer with a red, pink wound bed without slough.   06/22/18    2030     342          Intake/Output Last 24 hours No intake or output data in the 24 hours ending 05/30/19 0650  Labs/Imaging Results for orders placed or performed during the hospital encounter of 05/30/19 (from the past 48 hour(s))  CBC with Differential     Status: Abnormal   Collection Time: 05/30/19  4:17 AM  Result Value Ref Range   WBC 5.1 4.0 - 10.5 K/uL   RBC 4.54 3.87 - 5.11 MIL/uL   Hemoglobin 10.9 (L) 12.0 - 15.0 g/dL   HCT 36.2 36.0 - 46.0 %   MCV 79.7 (L) 80.0 - 100.0 fL   MCH 24.0 (L) 26.0 - 34.0 pg   MCHC 30.1 30.0 - 36.0 g/dL   RDW 26.0 (H) 11.5 - 15.5 %   Platelets 139 (L) 150 - 400 K/uL   nRBC 0.6 (H) 0.0 - 0.2 %   Neutrophils Relative % 69 %   Neutro Abs 3.5 1.7 - 7.7 K/uL   Lymphocytes Relative 19 %   Lymphs Abs 1.0 0.7 - 4.0 K/uL   Monocytes Relative 11 %   Monocytes Absolute 0.6 0.1 - 1.0 K/uL   Eosinophils Relative 0 %   Eosinophils Absolute 0.0 0.0 - 0.5 K/uL   Basophils Relative 0 %   Basophils Absolute 0.0 0.0 - 0.1 K/uL   Immature Granulocytes 1 %   Abs Immature Granulocytes 0.03 0.00 - 0.07 K/uL   Target Cells PRESENT     Comment: Performed  at [facility], [place] [address] [place], [place] [address]  Comprehensive metabolic panel     Status: Abnormal   Collection Time: 05/30/19  4:17 AM  Result Value Ref Range   Sodium 145 135 - 145 mmol/L   Potassium 3.7 3.5 - 5.1 mmol/L   Chloride 103 98 - 111 mmol/L   CO2 22 22 - 32 mmol/L   Glucose, Bld 76 70 - 99 mg/dL   BUN 85 (H) 8 - 23 mg/dL   Creatinine, Ser 3.45 (H) 0.44 - 1.00 mg/dL   Calcium 10.0 8.9 - 10.3 mg/dL   Total Protein 7.7 6.5 - 8.1 g/dL   Albumin 4.1 3.5 - 5.0 g/dL   AST 66 (H) 15 -  41 U/L   ALT 40 0 - 44 U/L   Alkaline Phosphatase 91 38 - 126 U/L   Total Bilirubin 5.6 (H) 0.3 - 1.2 mg/dL   GFR calc non Af Amer 12 (L) >60 mL/min   GFR calc Af Amer 14 (L) >60 mL/min   Anion gap 20 (H) 5 - 15    Comment: Performed at [facility], [place] [address], [place], [place] [address]  Troponin I - ONCE - STAT     Status: Abnormal   Collection Time: 05/30/19  4:17 AM  Result Value Ref Range   Troponin I 0.06 (HH) <0.03 ng/mL    Comment: CRITICAL RESULT CALLED TO, READ BACK BY AND VERIFIED WITHHulda Marin RN [id number] 05/30/19 A NAVARRO Performed at [facility], [place] [address] [place], [place] [address]   Lipase, blood     Status: Abnormal   Collection Time: 05/30/19  4:17 AM  Result Value Ref Range   Lipase 127 (H) 11 - 51 U/L    Comment: Performed at [facility], [place] [address] [place], [place] [address]  Brain natriuretic peptide     Status: Abnormal   Collection Time: 05/30/19  4:17 AM  Result Value Ref Range   B Natriuretic Peptide 1,478.3 (H) 0.0 - 100.0 pg/mL    Comment: Performed at [facility], [place] [address], [place], [place] [address]  POC CBG, ED     Status: Abnormal   Collection Time: 05/30/19  4:24 AM  Result Value Ref Range   Glucose-Capillary 66 (L) 70 - 99 mg/dL  CBG monitoring, ED (now and then every hour for 3 hours)     Status: Abnormal   Collection  Time: 05/30/19  6:03 AM  Result Value Ref Range   Glucose-Capillary 69 (L) 70 - 99 mg/dL   Dg Chest Portable 1 View  Result Date: 05/30/2019 CLINICAL DATA:  Cough and shortness of breath. EXAM: PORTABLE CHEST 1 VIEW COMPARISON:  Two-view chest x-ray the heart is enlarged. Atherosclerotic changes 12/19/2018 are FINDINGS: Noted at the aortic arch. Pulmonary arteries are enlarged. Mild edema is present. No significant effusions are evident. There is no significant airspace consolidation. IMPRESSION: 1. Cardiomegaly with increase in mild edema. Findings are consistent with congestive heart failure. 2. Aortic atherosclerosis. Electronically Signed   By: San Morelle M.D.   On: 05/30/2019 04:50    Pending Labs Unresulted Labs (From admission, onward)    Start     Ordered   05/30/19 0536  SARS Coronavirus 2 (CEPHEID - Performed in [place] hospital lab), Hosp Order  (Asymptomatic Patients Labs)  Once,   STAT    Question:  Rule Out  Answer:  Yes   05/30/19 0535          Vitals/Pain Today's Vitals   05/30/19 0404 05/30/19 0438 05/30/19 0530 05/30/19 0600  BP:   118/68 120/66  Pulse:   (!) 118 (!) 114  Resp:   19 18  Temp:  (!) 97.5 F (36.4 C)    TempSrc:  Rectal    SpO2:   99% 96%  Height: 5\' 4"  (1.626 m)     PainSc: 10-Worst pain ever       Isolation Precautions No active isolations  Medications Medications  sodium chloride flush (NS) 0.9 % injection 3 mL (has no administration in time range)  sodium chloride flush (NS) 0.9 % injection 3 mL (has no administration in time range)  0.9 %  sodium chloride infusion (has no administration in time range)  LORazepam (ATIVAN) injection 0.5 mg (0.5 mg Intravenous Given 05/30/19 0452)  dextrose 50 % solution 50 mL (50 mLs Intravenous Given 05/30/19 3276)    Mobility walks with device Low fall risk   Focused Assessments Cardiac Assessment Handoff:    Lab Results  Component Value Date   CKTOTAL 108 11/03/2011   CKMB 2.2  11/03/2011   TROPONINI 0.06 ([place]) 05/30/2019   No results found for: DDIMER Does the Patient currently have chest pain? No , Pulmonary Assessment Handoff:  Lung sounds: clear        R Recommendations: See Admitting Provider Note  Report given to:   Additional Notes:

## 2019-05-30 NOTE — Evaluation (Signed)
Physical Therapy Evaluation Patient Details Name: Kathleen [name] MRN: [id number] DOB: August 26, 1946 Today's Date: 05/30/2019   History of Present Illness  73 y.o. female with medical history significant of hypertension, diastolic congestive heart failure, CKD stage IV, coronary artery disease, diabetes mellitus, paroxysmal A. fib, GI bleed who was brought to the emergency department from home for the evaluation of persistent nausea, vomiting, abdominal pain and diarrhea. CT abdomen/pelvis shows mild acute pancreatitis  Clinical Impression  Pt admitted with above diagnosis. Pt currently with functional limitations due to the deficits listed below (see PT Problem List).  Pt limited this date by weakness, N/V. Will continue to follow. Recommend HHPT at this time, pt has aide 7days/wk 2 hrs per day to assist with ADLs as needed Pt will benefit from skilled PT to increase their independence and safety with mobility to allow discharge to the venue listed below.       Follow Up Recommendations Home health PT    Equipment Recommendations  None recommended by PT    Recommendations for Other Services       Precautions / Restrictions Precautions Precautions: Fall Restrictions Weight Bearing Restrictions: No      Mobility  Bed Mobility Overal bed mobility: Needs Assistance Bed Mobility: Supine to Sit;Sit to Supine     Supine to sit: Min guard;HOB elevated Sit to supine: Min assist   General bed mobility comments: reliant on rail, incr time, assist to lift LEs onto bed  Transfers Overall transfer level: Needs assistance Equipment used: 1 person hand held assist;2 person hand held assist Transfers: Sit to/from Stand Sit to Stand: Min assist         General transfer comment: assist to rise and stabilize, incr time needed; lateral steps along EOB; further activity/amb limited by N/V  Ambulation/Gait                Stairs            Wheelchair Mobility    Modified  Rankin (Stroke Patients Only)       Balance Overall balance assessment: Needs assistance Sitting-balance support: Feet supported;No upper extremity supported Sitting balance-Leahy Scale: Fair       Standing balance-Leahy Scale: Poor Standing balance comment: reliant on UEs                             Pertinent Vitals/Pain Pain Assessment: No/denies pain    Home Living Family/patient expects to be discharged to:: Private residence Living Arrangements: Alone Available Help at Discharge: Personal care attendant;Family(7 days/wk 2 hrs per day) Type of Home: House Home Access: Level entry     Home Layout: One level Home Equipment: [place] - 4 wheels;Cane - single point;Shower seat      Prior Function Level of Independence: Independent with assistive device(s)   Gait / Transfers Assistance Needed: amb with rollator     Comments: aide assistst with meals, laundry, ADLs prn     Hand Dominance        Extremity/Trunk Assessment   Upper Extremity Assessment Upper Extremity Assessment: Overall WFL for tasks assessed    Lower Extremity Assessment Lower Extremity Assessment: Generalized weakness       Communication   Communication: No difficulties  Cognition Arousal/Alertness: Awake/alert Behavior During Therapy: WFL for tasks assessed/performed Overall Cognitive Status: Within Functional Limits for tasks assessed  General Comments      Exercises     Assessment/Plan    PT Assessment Patient needs continued PT services  PT Problem List Decreased activity tolerance;Decreased balance;Decreased mobility       PT Treatment Interventions DME instruction;Therapeutic exercise;Gait training;Functional mobility training;Therapeutic activities;Patient/family education    PT Goals (Current goals can be found in the Care Plan section)  Acute Rehab PT Goals PT Goal Formulation: With patient Time For  Goal Achievement: 06/13/19 Potential to Achieve Goals: Good    Frequency Min 3X/week   Barriers to discharge        Co-evaluation               AM-PAC PT "6 Clicks" Mobility  Outcome Measure Help needed turning from your back to your side while in a flat bed without using bedrails?: A Little Help needed moving from lying on your back to sitting on the side of a flat bed without using bedrails?: A Little Help needed moving to and from a bed to a chair (including a wheelchair)?: A Little Help needed standing up from a chair using your arms (e.g., wheelchair or bedside chair)?: A Little Help needed to walk in hospital room?: A Lot Help needed climbing 3-5 steps with a railing? : A Lot 6 Click Score: 16    End of Session   Activity Tolerance: Other (comment)(N/V) Patient left: in bed;with call bell/phone within reach;with family/visitor present   PT Visit Diagnosis: Unsteadiness on feet (R26.81)    Time: 5041-3643 PT Time Calculation (min) (ACUTE ONLY): 19 min   Charges:   PT Evaluation $PT Eval Low Complexity: 1 Low          Kenyon Ana, PT  Pager: [phone] Acute Rehab Dept [facility]): [phone]   05/30/2019   [facility] 05/30/2019, 2:36 PM

## 2019-05-30 NOTE — Consult Note (Signed)
Reason for Consult: Dysphagia and diarrhea Referring Physician: [facility]  Kathleen [name] HPI: This is a 73 year old female with multiple medical problems admitted for complaints of dysphagia, vomiting, and diarrhea.  She was evaluated in the office two days ago for these complaints.  In fact, her diarrheal complaints were greater than the dysphagia complaints.  Per the patient she started to have acute diarrhea for the past 1-2 weeks and during this time period she lost 10 lbs, which was confirmed with the office scales.  The diarrhea can wake her up at night.  There is no evidence of hematochezia, melena, or abdominal pain.  There is a history of upper GI AVMs that were cauterized on multiple occassions for her IDA.  Her current blood work shows that the anemia has markedly improved.  The plan was for her to undergo further work up of her diarrhea with an EGD/Colonoscopy this Friday.  As for her dysphagia complaints, she states that she has a "gagging" sensation.  It was difficult for her to tolerate PO and her symptoms worsened.  She started to have vomiting, but there was no report of hematemesis.  Past Medical History:  Diagnosis Date  . Arthritis   . Blood transfusion    no side affects  . CHF (congestive heart failure) ([place])   . Coronary artery disease    Cypher stent to the RCA in 2003  . Diabetes mellitus    Borderline  . GERD (gastroesophageal reflux disease)   . Hypertension   . Shortness of breath   . Sleep apnea     Past Surgical History:  Procedure Laterality Date  . BIOPSY  06/06/2018   Procedure: BIOPSY;  Surgeon: Otis Brace, MD;  Location: [facility];  Service: Gastroenterology;;  . CARDIOVERSION N/A 03/17/2015   Procedure: CARDIOVERSION;  Surgeon: Sueanne Margarita, MD;  Location: [facility];  Service: Cardiovascular;  Laterality: N/A;  . CHOLECYSTECTOMY    . CORONARY ANGIOPLASTY WITH STENT PLACEMENT    . ENTEROSCOPY N/A 06/28/2018   Procedure:  ENTEROSCOPY;  Surgeon: Carol Ada, MD;  Location: [facility];  Service: Endoscopy;  Laterality: N/A;  . ENTEROSCOPY N/A 09/12/2018   Procedure: ENTEROSCOPY;  Surgeon: Carol Ada, MD;  Location: [facility];  Service: Endoscopy;  Laterality: N/A;  . ENTEROSCOPY N/A 12/21/2018   Procedure: ENTEROSCOPY;  Surgeon: Carol Ada, MD;  Location: [facility];  Service: Endoscopy;  Laterality: N/A;  . ENTEROSCOPY N/A 02/03/2019   Procedure: ENTEROSCOPY;  Surgeon: Ladene Artist, MD;  Location: [facility];  Service: Endoscopy;  Laterality: N/A;  . ESOPHAGOGASTRODUODENOSCOPY (EGD) WITH PROPOFOL N/A 06/06/2018   Procedure: ESOPHAGOGASTRODUODENOSCOPY (EGD) WITH PROPOFOL;  Surgeon: Otis Brace, MD;  Location: [facility];  Service: Gastroenterology;  Laterality: N/A;  . GIVENS CAPSULE STUDY N/A 06/25/2018   Procedure: GIVENS CAPSULE STUDY;  Surgeon: Carol Ada, MD;  Location: [facility];  Service: Endoscopy;  Laterality: N/A;  . HOT HEMOSTASIS N/A 06/06/2018   Procedure: HOT HEMOSTASIS (ARGON PLASMA COAGULATION/BICAP);  Surgeon: Otis Brace, MD;  Location: [facility];  Service: Gastroenterology;  Laterality: N/A;  . HOT HEMOSTASIS N/A 06/28/2018   Procedure: HOT HEMOSTASIS (ARGON PLASMA COAGULATION/BICAP);  Surgeon: Carol Ada, MD;  Location: [facility];  Service: Endoscopy;  Laterality: N/A;  . HOT HEMOSTASIS N/A 12/21/2018   Procedure: HOT HEMOSTASIS (ARGON PLASMA COAGULATION/BICAP);  Surgeon: Carol Ada, MD;  Location: [facility];  Service: Endoscopy;  Laterality: N/A;  . HOT HEMOSTASIS N/A 02/03/2019   Procedure: HOT HEMOSTASIS (ARGON PLASMA COAGULATION/BICAP);  Surgeon: Ladene Artist, MD;  Location: [facility];  Service: Endoscopy;  Laterality: N/A;  . TOTAL KNEE ARTHROPLASTY      Family History  Problem Relation Age of Onset  . Hypertension Father   . Heart disease Father   . Gout Father   . Arthritis Father   . Heart attack Father   . Heart attack Brother    . Hypertension Brother   . Hypertension Sister   . Stroke Neg Hx     Social History:  reports that she quit smoking about 5 years ago. Her smoking use included cigarettes. She has a 9.00 pack-year smoking history. She has never used smokeless tobacco. She reports that she does not drink alcohol or use drugs.  Allergies:  Allergies  Allergen Reactions  . Penicillins Hives and Shortness Of Breath    Tolerates Ceftin, Ceftriaxone, and Cefepime Has patient had a PCN reaction causing immediate rash, facial/tongue/throat swelling, SOB or lightheadedness with hypotension: Y Has patient had a PCN reaction causing severe rash involving mucus membranes or skin necrosis: Y Has patient had a PCN reaction that required hospitalization: Y Has patient had a PCN reaction occurring within the last 10 years: N     Medications:  Scheduled: . allopurinol  100 mg Oral Daily  . famotidine  20 mg Oral QHS  . insulin aspart  0-9 Units Subcutaneous TID WC  . isosorbide-hydrALAZINE  1 tablet Oral TID  . metoprolol tartrate  25 mg Oral BID  . mometasone-formoterol  2 puff Inhalation BID  . pantoprazole  40 mg Oral Daily  . pravastatin  80 mg Oral Daily  . sodium chloride flush  3 mL Intravenous Q12H   Continuous: . sodium chloride    . sodium chloride      Results for orders placed or performed during the hospital encounter of 05/30/19 (from the past 24 hour(s))  CBC with Differential     Status: Abnormal   Collection Time: 05/30/19  4:17 AM  Result Value Ref Range   WBC 5.1 4.0 - 10.5 K/uL   RBC 4.54 3.87 - 5.11 MIL/uL   Hemoglobin 10.9 (L) 12.0 - 15.0 g/dL   HCT 36.2 36.0 - 46.0 %   MCV 79.7 (L) 80.0 - 100.0 fL   MCH 24.0 (L) 26.0 - 34.0 pg   MCHC 30.1 30.0 - 36.0 g/dL   RDW 26.0 (H) 11.5 - 15.5 %   Platelets 139 (L) 150 - 400 K/uL   nRBC 0.6 (H) 0.0 - 0.2 %   Neutrophils Relative % 69 %   Neutro Abs 3.5 1.7 - 7.7 K/uL   Lymphocytes Relative 19 %   Lymphs Abs 1.0 0.7 - 4.0 K/uL    Monocytes Relative 11 %   Monocytes Absolute 0.6 0.1 - 1.0 K/uL   Eosinophils Relative 0 %   Eosinophils Absolute 0.0 0.0 - 0.5 K/uL   Basophils Relative 0 %   Basophils Absolute 0.0 0.0 - 0.1 K/uL   Immature Granulocytes 1 %   Abs Immature Granulocytes 0.03 0.00 - 0.07 K/uL   Target Cells PRESENT   Comprehensive metabolic panel     Status: Abnormal   Collection Time: 05/30/19  4:17 AM  Result Value Ref Range   Sodium 145 135 - 145 mmol/L   Potassium 3.7 3.5 - 5.1 mmol/L   Chloride 103 98 - 111 mmol/L   CO2 22 22 - 32 mmol/L   Glucose, Bld 76 70 - 99 mg/dL   BUN 85 (H)  8 - 23 mg/dL   Creatinine, Ser 3.45 (H) 0.44 - 1.00 mg/dL   Calcium 10.0 8.9 - 10.3 mg/dL   Total Protein 7.7 6.5 - 8.1 g/dL   Albumin 4.1 3.5 - 5.0 g/dL   AST 66 (H) 15 - 41 U/L   ALT 40 0 - 44 U/L   Alkaline Phosphatase 91 38 - 126 U/L   Total Bilirubin 5.6 (H) 0.3 - 1.2 mg/dL   GFR calc non Af Amer 12 (L) >60 mL/min   GFR calc Af Amer 14 (L) >60 mL/min   Anion gap 20 (H) 5 - 15  Troponin I - ONCE - STAT     Status: Abnormal   Collection Time: 05/30/19  4:17 AM  Result Value Ref Range   Troponin I 0.06 (HH) <0.03 ng/mL  Lipase, blood     Status: Abnormal   Collection Time: 05/30/19  4:17 AM  Result Value Ref Range   Lipase 127 (H) 11 - 51 U/L  Brain natriuretic peptide     Status: Abnormal   Collection Time: 05/30/19  4:17 AM  Result Value Ref Range   B Natriuretic Peptide 1,478.3 (H) 0.0 - 100.0 pg/mL  POC CBG, ED     Status: Abnormal   Collection Time: 05/30/19  4:24 AM  Result Value Ref Range   Glucose-Capillary 66 (L) 70 - 99 mg/dL  CBG monitoring, ED (now and then every hour for 3 hours)     Status: Abnormal   Collection Time: 05/30/19  6:03 AM  Result Value Ref Range   Glucose-Capillary 69 (L) 70 - 99 mg/dL  SARS Coronavirus 2 (CEPHEID - Performed in [place] hospital lab), Hosp Order     Status: None   Collection Time: 05/30/19  6:17 AM   Specimen: Nasopharyngeal Swab  Result Value Ref  Range   SARS Coronavirus 2 NEGATIVE NEGATIVE  CBG monitoring, ED (now and then every hour for 3 hours)     Status: Abnormal   Collection Time: 05/30/19  7:00 AM  Result Value Ref Range   Glucose-Capillary 142 (H) 70 - 99 mg/dL  Glucose, capillary     Status: Abnormal   Collection Time: 05/30/19  9:04 AM  Result Value Ref Range   Glucose-Capillary 118 (H) 70 - 99 mg/dL  Glucose, capillary     Status: Abnormal   Collection Time: 05/30/19 11:43 AM  Result Value Ref Range   Glucose-Capillary 115 (H) 70 - 99 mg/dL     Ct Abdomen Pelvis Wo Contrast  Result Date: 05/30/2019 CLINICAL DATA:  Persistent nausea, vomiting, abdominal pain and diarrhea. Symptoms 2 weeks. History of GI bleed. Hemodynamically stable. EXAM: CT ABDOMEN AND PELVIS WITHOUT CONTRAST TECHNIQUE: Multidetector CT imaging of the abdomen and pelvis was performed following the standard protocol without IV contrast. COMPARISON:  09/11/2018, 08/15/2015 FINDINGS: Lower chest: Subtle patchy density in the lung bases which may be due to atelectasis versus early infection. Atelectasis over the lingula. Stable cardiomegaly. Calcified plaque over the coronary arteries. Mild calcified plaque over the thoracic aorta. Hepatobiliary: Previous cholecystectomy. Stable linear calcification over the left lobe of the liver. Biliary tree is normal. Pancreas: Mild stranding of the fat adjacent the tail of pancreas. Spleen: Normal. Adrenals/Urinary Tract: Stable prominence of the adrenal glands. Kidneys at the lower limits of normal in size with a few stable right renal cysts. No hydronephrosis. Ureters and bladder are unremarkable. Stomach/Bowel: Stomach and small bowel are normal. Appendix is normal. Diverticulosis of the distal descending and sigmoid  colon. Very subtle stranding of the fat adjacent the distal descending colon in the left lower quadrant which may be within normal although could be due to mild acute diverticulitis. Short segment of transverse  colon extends into a moderate size midline umbilical hernia. Vascular/Lymphatic: Moderate calcified plaque over the abdominal aorta. No adenopathy. Reproductive: Normal. Other: Moderate size midline umbilical hernia containing peritoneal fat and short segment of transverse colon. No evidence of bowel obstruction. Small amount of free fluid in the pelvis. Partially visualized catheter on the most inferior image over the anterior pelvis/perineum. Musculoskeletal: Degenerative change of the spine and hips. Stable grade 1 anterolisthesis of L4 on L5. IMPRESSION: 1. Stranding of the peripancreatic fat adjacent the tail the pancreas which could be seen due to mild acute pancreatitis. Recommend clinical correlation. 2. Diverticulosis of the descending colon with subtle adjacent stranding of the pericolonic fat as findings may be within normal although could be seen due to mild acute diverticulitis. 3. Moderate size umbilical hernia containing peritoneal fat and short segment of transverse colon. No evidence of bowel obstruction. 4. Subtle patchy opacification over the lung bases which may be due to atelectasis or early infection. Lingular atelectasis. 5.  Cardiomegaly.  Atherosclerotic coronary artery disease. 6.  Aortic Atherosclerosis (ICD10-I70.0). 7.  Slightly small kidneys.  Several stable right renal cysts. Electronically Signed   By: Marin Olp M.D.   On: 05/30/2019 11:07   Dg Chest Portable 1 View  Result Date: 05/30/2019 CLINICAL DATA:  Cough and shortness of breath. EXAM: PORTABLE CHEST 1 VIEW COMPARISON:  Two-view chest x-ray the heart is enlarged. Atherosclerotic changes 12/19/2018 are FINDINGS: Noted at the aortic arch. Pulmonary arteries are enlarged. Mild edema is present. No significant effusions are evident. There is no significant airspace consolidation. IMPRESSION: 1. Cardiomegaly with increase in mild edema. Findings are consistent with congestive heart failure. 2. Aortic atherosclerosis.  Electronically Signed   By: San Morelle M.D.   On: 05/30/2019 04:50    ROS:  As stated above in the HPI otherwise negative.  Blood pressure (!) 105/92, pulse 96, temperature (!) 97.5 F (36.4 C), temperature source Oral, resp. rate 18, height 5\' 4"  (1.626 m), SpO2 100 %.    PE: Gen: NAD, Alert and Oriented, weak appearing HEENT:  [place]/AT, EOMI Neck: Supple, no LAD Lungs: CTA Bilaterally CV: RRR without M/G/R ABM: Soft, NTND, +BS, mild diffuse abdominal tenderness, no acute abdomen Ext: No C/C/E  Assessment/Plan: 1) Diarrhea. 2) Dysphagia. 3) CKD. 4) Weight loss.   It is not clear why she is having issues with vomiting and dysphagia.  The last enteroscopy was on 02/03/2019 and the esophagus was normal.  Her last colonoscopy was 03/06/2013.  The plan to perform the EGD and colonoscopy will proceed for Friday, but it is unclear if she will be able to prep for the procedure with her vomiting complaints.  If she cannot prep, an EGD will be performed.  I am unable to perform the procedure tomorrow as a result of a scheduling conflict and anesthesia assistance is required for her procedures.  Plan: 1) EGD/Colonoscopy this Friday. 2) Continue with pantoprazole. Aizley Stenseth D 05/30/2019, 12:43 PM

## 2019-05-30 NOTE — ED Triage Notes (Signed)
Pt arrives with abdominal pain that started a week ago, stating she has had 4 episodes of vomiting tonight. Pt stating she has at 3 episodes of diarrhea. Pt states she is scheduled for an endoscopy later this week.

## 2019-05-30 NOTE — Progress Notes (Signed)
SLP Cancellation Note  Patient Details Name: FAELYN SIGLER MRN: [id number] DOB: 1946-10-12   Cancelled treatment:       Reason Eval/Treat Not Completed: Patient not medically ready. Patient appears to be suffering from a primary esophageal swallowing problem. Plan is for her to have EGD on Friday. Patient and RN both report that patient has been gagging even when taking small sips of water. SLP will f/u with patient after EGD on Friday. Thank you!   Nadara Mode Tarrell 05/30/2019, 3:42 PM   Sonia Baller, MA, CCC-SLP Speech Therapy [facility]

## 2019-05-30 NOTE — Progress Notes (Signed)
Patient has not been unable to tolerate PO's. Pt complains of nausea. Pt was unable to tolerate sips of water. Unable to give am medications.

## 2019-05-30 NOTE — Progress Notes (Signed)
Patient has not been unable to tolerate PO's. Pt complains of nausea. Sips of clear liquids were attempted but unsuccessful. Will continue to monitor. Provider updated.

## 2019-05-31 ENCOUNTER — Inpatient Hospital Stay (HOSPITAL_COMMUNITY): Payer: Medicare Other

## 2019-05-31 ENCOUNTER — Observation Stay (HOSPITAL_COMMUNITY): Payer: Medicare Other

## 2019-05-31 DIAGNOSIS — N184 Chronic kidney disease, stage 4 (severe): Secondary | ICD-10-CM | POA: Diagnosis present

## 2019-05-31 DIAGNOSIS — I5032 Chronic diastolic (congestive) heart failure: Secondary | ICD-10-CM | POA: Diagnosis not present

## 2019-05-31 DIAGNOSIS — I959 Hypotension, unspecified: Secondary | ICD-10-CM | POA: Diagnosis present

## 2019-05-31 DIAGNOSIS — D61818 Other pancytopenia: Secondary | ICD-10-CM | POA: Diagnosis present

## 2019-05-31 DIAGNOSIS — R188 Other ascites: Secondary | ICD-10-CM | POA: Diagnosis present

## 2019-05-31 DIAGNOSIS — E872 Acidosis: Secondary | ICD-10-CM | POA: Diagnosis present

## 2019-05-31 DIAGNOSIS — J440 Chronic obstructive pulmonary disease with acute lower respiratory infection: Secondary | ICD-10-CM | POA: Diagnosis present

## 2019-05-31 DIAGNOSIS — K5733 Diverticulitis of large intestine without perforation or abscess with bleeding: Secondary | ICD-10-CM | POA: Diagnosis not present

## 2019-05-31 DIAGNOSIS — G9341 Metabolic encephalopathy: Secondary | ICD-10-CM | POA: Diagnosis present

## 2019-05-31 DIAGNOSIS — I4819 Other persistent atrial fibrillation: Secondary | ICD-10-CM | POA: Diagnosis present

## 2019-05-31 DIAGNOSIS — Z6839 Body mass index (BMI) 39.0-39.9, adult: Secondary | ICD-10-CM | POA: Diagnosis not present

## 2019-05-31 DIAGNOSIS — I482 Chronic atrial fibrillation, unspecified: Secondary | ICD-10-CM | POA: Diagnosis not present

## 2019-05-31 DIAGNOSIS — R1310 Dysphagia, unspecified: Secondary | ICD-10-CM | POA: Diagnosis present

## 2019-05-31 DIAGNOSIS — Z1159 Encounter for screening for other viral diseases: Secondary | ICD-10-CM | POA: Diagnosis not present

## 2019-05-31 DIAGNOSIS — I5033 Acute on chronic diastolic (congestive) heart failure: Secondary | ICD-10-CM | POA: Diagnosis present

## 2019-05-31 DIAGNOSIS — Y95 Nosocomial condition: Secondary | ICD-10-CM | POA: Diagnosis present

## 2019-05-31 DIAGNOSIS — I4891 Unspecified atrial fibrillation: Secondary | ICD-10-CM | POA: Diagnosis not present

## 2019-05-31 DIAGNOSIS — E2740 Unspecified adrenocortical insufficiency: Secondary | ICD-10-CM | POA: Diagnosis present

## 2019-05-31 DIAGNOSIS — I130 Hypertensive heart and chronic kidney disease with heart failure and stage 1 through stage 4 chronic kidney disease, or unspecified chronic kidney disease: Secondary | ICD-10-CM | POA: Diagnosis present

## 2019-05-31 DIAGNOSIS — E785 Hyperlipidemia, unspecified: Secondary | ICD-10-CM | POA: Diagnosis present

## 2019-05-31 DIAGNOSIS — E1122 Type 2 diabetes mellitus with diabetic chronic kidney disease: Secondary | ICD-10-CM | POA: Diagnosis present

## 2019-05-31 DIAGNOSIS — J189 Pneumonia, unspecified organism: Secondary | ICD-10-CM | POA: Diagnosis present

## 2019-05-31 DIAGNOSIS — K529 Noninfective gastroenteritis and colitis, unspecified: Secondary | ICD-10-CM | POA: Diagnosis present

## 2019-05-31 DIAGNOSIS — R17 Unspecified jaundice: Secondary | ICD-10-CM | POA: Diagnosis present

## 2019-05-31 DIAGNOSIS — K8500 Idiopathic acute pancreatitis without necrosis or infection: Secondary | ICD-10-CM | POA: Diagnosis present

## 2019-05-31 DIAGNOSIS — R627 Adult failure to thrive: Secondary | ICD-10-CM | POA: Diagnosis not present

## 2019-05-31 DIAGNOSIS — E669 Obesity, unspecified: Secondary | ICD-10-CM | POA: Diagnosis present

## 2019-05-31 DIAGNOSIS — I361 Nonrheumatic tricuspid (valve) insufficiency: Secondary | ICD-10-CM | POA: Diagnosis not present

## 2019-05-31 DIAGNOSIS — I340 Nonrheumatic mitral (valve) insufficiency: Secondary | ICD-10-CM | POA: Diagnosis not present

## 2019-05-31 DIAGNOSIS — E870 Hyperosmolality and hypernatremia: Secondary | ICD-10-CM | POA: Diagnosis present

## 2019-05-31 DIAGNOSIS — K5732 Diverticulitis of large intestine without perforation or abscess without bleeding: Secondary | ICD-10-CM | POA: Diagnosis present

## 2019-05-31 DIAGNOSIS — N179 Acute kidney failure, unspecified: Secondary | ICD-10-CM | POA: Diagnosis present

## 2019-05-31 DIAGNOSIS — I9589 Other hypotension: Secondary | ICD-10-CM | POA: Diagnosis not present

## 2019-05-31 DIAGNOSIS — R945 Abnormal results of liver function studies: Secondary | ICD-10-CM | POA: Diagnosis not present

## 2019-05-31 DIAGNOSIS — E162 Hypoglycemia, unspecified: Secondary | ICD-10-CM | POA: Diagnosis present

## 2019-05-31 LAB — CBC
HCT: 35.1 % — ABNORMAL LOW (ref 36.0–46.0)
Hemoglobin: 10.5 g/dL — ABNORMAL LOW (ref 12.0–15.0)
MCH: 24 pg — ABNORMAL LOW (ref 26.0–34.0)
MCHC: 29.9 g/dL — ABNORMAL LOW (ref 30.0–36.0)
MCV: 80.3 fL (ref 80.0–100.0)
Platelets: 99 10*3/uL — ABNORMAL LOW (ref 150–400)
RBC: 4.37 MIL/uL (ref 3.87–5.11)
RDW: 25.7 % — ABNORMAL HIGH (ref 11.5–15.5)
WBC: 3.3 10*3/uL — ABNORMAL LOW (ref 4.0–10.5)
nRBC: 0.9 % — ABNORMAL HIGH (ref 0.0–0.2)

## 2019-05-31 LAB — HEPATIC FUNCTION PANEL
ALT: 38 U/L (ref 0–44)
AST: 50 U/L — ABNORMAL HIGH (ref 15–41)
Albumin: 3.6 g/dL (ref 3.5–5.0)
Alkaline Phosphatase: 82 U/L (ref 38–126)
Bilirubin, Direct: 3.1 mg/dL — ABNORMAL HIGH (ref 0.0–0.2)
Indirect Bilirubin: 2.2 mg/dL — ABNORMAL HIGH (ref 0.3–0.9)
Total Bilirubin: 5.3 mg/dL — ABNORMAL HIGH (ref 0.3–1.2)
Total Protein: 7 g/dL (ref 6.5–8.1)

## 2019-05-31 LAB — BASIC METABOLIC PANEL
Anion gap: 19 — ABNORMAL HIGH (ref 5–15)
Anion gap: 18 — ABNORMAL HIGH (ref 5–15)
BUN: 83 mg/dL — ABNORMAL HIGH (ref 8–23)
BUN: 87 mg/dL — ABNORMAL HIGH (ref 8–23)
CO2: 19 mmol/L — ABNORMAL LOW (ref 22–32)
CO2: 20 mmol/L — ABNORMAL LOW (ref 22–32)
Calcium: 9.2 mg/dL (ref 8.9–10.3)
Calcium: 9.2 mg/dL (ref 8.9–10.3)
Chloride: 109 mmol/L (ref 98–111)
Chloride: 109 mmol/L (ref 98–111)
Creatinine, Ser: 3.37 mg/dL — ABNORMAL HIGH (ref 0.44–1.00)
Creatinine, Ser: 3.54 mg/dL — ABNORMAL HIGH (ref 0.44–1.00)
GFR calc Af Amer: 15 mL/min — ABNORMAL LOW (ref >60)
GFR calc Af Amer: 14 mL/min — ABNORMAL LOW (ref >60)
GFR calc non Af Amer: 13 mL/min — ABNORMAL LOW (ref >60)
GFR calc non Af Amer: 12 mL/min — ABNORMAL LOW (ref >60)
Glucose, Bld: 104 mg/dL — ABNORMAL HIGH (ref 70–99)
Glucose, Bld: 94 mg/dL (ref 70–99)
Potassium: 2.8 mmol/L — ABNORMAL LOW (ref 3.5–5.1)
Potassium: 3.2 mmol/L — ABNORMAL LOW (ref 3.5–5.1)
Sodium: 147 mmol/L — ABNORMAL HIGH (ref 135–145)
Sodium: 147 mmol/L — ABNORMAL HIGH (ref 135–145)

## 2019-05-31 LAB — URINALYSIS, ROUTINE W REFLEX MICROSCOPIC
Bilirubin Urine: NEGATIVE
Glucose, UA: NEGATIVE mg/dL
Hgb urine dipstick: NEGATIVE
Ketones, ur: NEGATIVE mg/dL
Leukocytes,Ua: NEGATIVE
Nitrite: NEGATIVE
Protein, ur: NEGATIVE mg/dL
Specific Gravity, Urine: 1.012 (ref 1.005–1.030)
pH: 5 (ref 5.0–8.0)

## 2019-05-31 LAB — GLUCOSE, CAPILLARY
Glucose-Capillary: 130 mg/dL — ABNORMAL HIGH (ref 70–99)
Glucose-Capillary: 65 mg/dL — ABNORMAL LOW (ref 70–99)
Glucose-Capillary: 72 mg/dL (ref 70–99)
Glucose-Capillary: 86 mg/dL (ref 70–99)
Glucose-Capillary: 96 mg/dL (ref 70–99)
Glucose-Capillary: 113 mg/dL — ABNORMAL HIGH (ref 70–99)

## 2019-05-31 LAB — BLOOD GAS, ARTERIAL
Acid-base deficit: 4.5 mmol/L — ABNORMAL HIGH (ref 0.0–2.0)
Bicarbonate: 19.9 mmol/L — ABNORMAL LOW (ref 20.0–28.0)
Drawn by: 336831
FIO2: 21
O2 Saturation: 94.5 %
Patient temperature: 37
pCO2 arterial: 36.4 mmHg (ref 32.0–48.0)
pH, Arterial: 7.356 (ref 7.350–7.450)
pO2, Arterial: 78.1 mmHg — ABNORMAL LOW (ref 83.0–108.0)

## 2019-05-31 LAB — AMMONIA: Ammonia: 35 umol/L (ref 9–35)

## 2019-05-31 LAB — LIPASE, BLOOD: Lipase: 579 U/L — ABNORMAL HIGH (ref 11–51)

## 2019-05-31 MED ORDER — LACTULOSE 10 GM/15ML PO SOLN
30.0000 g | Freq: Three times a day (TID) | ORAL | Status: DC
  Filled 2019-05-31: qty 60

## 2019-05-31 MED ORDER — PANTOPRAZOLE SODIUM 40 MG IV SOLR
40.0000 mg | Freq: Two times a day (BID) | INTRAVENOUS | Status: DC
  Administered 2019-05-31 – 2019-06-04 (×9): 40 mg via INTRAVENOUS
  Filled 2019-05-31 (×9): qty 40

## 2019-05-31 MED ORDER — POTASSIUM CHLORIDE 10 MEQ/100ML IV SOLN
10.0000 meq | INTRAVENOUS | Status: AC
  Administered 2019-05-31 (×3): 10 meq via INTRAVENOUS
  Filled 2019-05-31 (×3): qty 100

## 2019-05-31 MED ORDER — CIPROFLOXACIN IN D5W 400 MG/200ML IV SOLN
400.0000 mg | INTRAVENOUS | Status: DC
  Administered 2019-05-31 – 2019-06-05 (×6): 400 mg via INTRAVENOUS
  Filled 2019-05-31 (×6): qty 200

## 2019-05-31 MED ORDER — MORPHINE SULFATE (PF) 2 MG/ML IV SOLN: 0.5000 mg | INTRAVENOUS | Status: DC | PRN

## 2019-05-31 MED ORDER — DEXTROSE 5 % IV SOLN
INTRAVENOUS | Status: DC
  Administered 2019-05-31 – 2019-06-02 (×3): via INTRAVENOUS

## 2019-05-31 MED ORDER — DEXTROSE 50 % IV SOLN
INTRAVENOUS | Status: AC
  Filled 2019-05-31: qty 50

## 2019-05-31 MED ORDER — METOPROLOL TARTRATE 5 MG/5ML IV SOLN
5.0000 mg | Freq: Three times a day (TID) | INTRAVENOUS | Status: DC
  Administered 2019-05-31: 5 mg via INTRAVENOUS
  Filled 2019-05-31: qty 5

## 2019-05-31 MED ORDER — METOPROLOL TARTRATE 5 MG/5ML IV SOLN
5.0000 mg | Freq: Four times a day (QID) | INTRAVENOUS | Status: DC
  Administered 2019-05-31 – 2019-06-03 (×5): 5 mg via INTRAVENOUS
  Filled 2019-05-31 (×7): qty 5

## 2019-05-31 MED ORDER — THIAMINE HCL 100 MG/ML IJ SOLN
100.0000 mg | Freq: Every day | INTRAMUSCULAR | Status: DC
  Administered 2019-05-31 – 2019-06-04 (×5): 100 mg via INTRAVENOUS
  Filled 2019-05-31 (×5): qty 2

## 2019-05-31 MED ORDER — LIP MEDEX EX OINT
TOPICAL_OINTMENT | CUTANEOUS | Status: AC
  Administered 2019-05-31: 12:00:00
  Filled 2019-05-31: qty 7

## 2019-05-31 MED ORDER — MORPHINE SULFATE (PF) 2 MG/ML IV SOLN: 1.0000 mg | INTRAVENOUS | Status: DC | PRN

## 2019-05-31 MED ORDER — DEXTROSE 50 % IV SOLN
25.0000 mL | Freq: Once | INTRAVENOUS | Status: AC
  Administered 2019-05-31: 25 mL via INTRAVENOUS

## 2019-05-31 MED ORDER — METRONIDAZOLE IN NACL 5-0.79 MG/ML-% IV SOLN
500.0000 mg | Freq: Three times a day (TID) | INTRAVENOUS | Status: DC
  Administered 2019-05-31 – 2019-06-06 (×17): 500 mg via INTRAVENOUS
  Filled 2019-05-31 (×16): qty 100

## 2019-05-31 MED ORDER — PEG 3350-KCL-NA BICARB-NACL 420 G PO SOLR: 4000.0000 mL | Freq: Once | ORAL | Status: DC

## 2019-05-31 MED ORDER — POTASSIUM CHLORIDE 10 MEQ/100ML IV SOLN
10.0000 meq | INTRAVENOUS | Status: AC
  Administered 2019-05-31 (×2): 10 meq via INTRAVENOUS
  Filled 2019-05-31 (×2): qty 100

## 2019-05-31 MED ORDER — METRONIDAZOLE IN NACL 5-0.79 MG/ML-% IV SOLN
500.0000 mg | Freq: Three times a day (TID) | INTRAVENOUS | Status: DC
  Filled 2019-05-31: qty 100

## 2019-05-31 MED ORDER — LACTULOSE ENEMA
300.0000 mL | Freq: Every day | ORAL | Status: DC
  Administered 2019-05-31: 300 mL via RECTAL
  Filled 2019-05-31 (×2): qty 300

## 2019-05-31 NOTE — Progress Notes (Signed)
Initial Nutrition Assessment  RD working remotely.   DOCUMENTATION CODES:   Obesity unspecified  INTERVENTION:  - diet advancement as medically feasible. - will provide interventions/recommendations at follow-up dependent on medical course.    NUTRITION DIAGNOSIS:   Inadequate oral intake related to inability to eat as evidenced by NPO status.  GOAL:   Patient will meet greater than or equal to 90% of their needs  MONITOR:   Diet advancement, Labs, Weight trends, I & O's  REASON FOR ASSESSMENT:   Malnutrition Screening Tool  ASSESSMENT:   73 year old female with past medical history of arthritis, CHF, CAD, DM, GERD, HTN, and sleep apnea. She presented to the ED with complaints of dysphagia, vomiting, and diarrhea. She reported diarrhea began 1-2 weeks ago and that she lost 10 lb during that time. No associated abdominal pain, although diarrhea sometimes wakes her up at night.  Patient has been NPO since admission. She reports that dysphagia began around the same time as onset of diarrhea (1-2 weeks ago). Prior to that time she was able to consume a regular diet without issue. Since that time, she has been experiencing a "gagging" sensation with all intakes, including fluids, and feels like she cannot fully swallow items without them seemingly getting stuck in her throat. Due to this and ongoing diarrhea, she has had a poor appetite over the past 1-2 weeks and reports that even prior to that she has had a poor appetite "for a long time."   Another RD saw patient in July and at that time patient had reported poor appetite which had been ongoing for a long time prior that. At that time, patient was identified as meeting criteria for moderate malnutrition in the context of chronic illness as evidenced by mild fat and mild muscle depletion.  Per chart review, current weight is 191 lb and weight on 4/9 was 204 lb. This indicates 13 lb weight loss (6.4% body weight) in the past 2  months. No weight hx available between April and now. Patient reports that in the past 2 weeks she has lost 10 lb.   Per notes: plan is for EGD tomorrow (6/19). SLP was unable to attempt to work with patient today as RN had reported to SLP that patient was gagging even with small sips of water.    Medications reviewed; sliding scale novolog, 20 mg IV pepcid/day, 40 mg IV protonix BID, 10 mEq IV KCl x3 runs 6/18, 100 mg IV thiamine/day.  Labs reviewed; CBGs: 65, 130, and 72 mg/dl today, Na: 147 mmol/l, K: 2.8 mmol/l, BUN: 83 mg/dl, creatinine: 3.37 mg/dl, GFR: 15 ml/min.  IVF; D5 @ 75 ml/hr (306 kcal).      NUTRITION - FOCUSED PHYSICAL EXAM:  unable to complete at this time.   Diet Order:   Diet Order            Diet NPO time specified  Diet effective now              EDUCATION NEEDS:   Not appropriate for education at this time  Skin:  Skin Assessment: Reviewed RN Assessment  Last BM:  6/17  Height:   Ht Readings from Last 1 Encounters:  05/30/19 5\' 4"  (1.626 m)    Weight:   Wt Readings from Last 1 Encounters:  05/30/19 86.8 kg    Ideal Body Weight:  54.5 kg  BMI:  Body mass index is 32.85 kg/m.  Estimated Nutritional Needs:   Kcal:  1740-1910 kcal  Protein:  80-90 grams  Fluid:  >/= 2.5 L/day      Jarome Matin, MS, RD, LDN, [facility] Inpatient Clinical Dietitian Pager # [phone] After hours/weekend pager # [phone]

## 2019-05-31 NOTE — Progress Notes (Signed)
Subjective: No acute events.  Objective: Vital signs in last 24 hours: Temp:  [97.6 F (36.4 C)-97.7 F (36.5 C)] 97.7 F (36.5 C) (06/18 1226) Pulse Rate:  [61-121] 121 (06/18 1226) Resp:  [16-20] 16 (06/18 1226) BP: (110-132)/(50-72) 117/68 (06/18 1226) SpO2:  [97 %-100 %] 100 % (06/18 1226) Last BM Date: 05/30/19  Intake/Output from previous day: 06/17 0701 - 06/18 0700 In: 1041.6 [I.V.:991.5; IV Piggyback:50.1] Out: 200 [Urine:200] Intake/Output this shift: Total I/O In: 695.7 [I.V.:445.7; IV Piggyback:250] Out: 50 [Urine:50]  General appearance: arousable Resp: clear to auscultation bilaterally Cardio: irregularly irregular rhythm GI: tender in the upper and mid abdomen, no rebound or any evidence of an acute abdomen Extremities: + asterixis  Lab Results: Recent Labs    05/30/19 0417 05/31/19 0409  WBC 5.1 3.3*  HGB 10.9* 10.5*  HCT 36.2 35.1*  PLT 139* 99*   BMET Recent Labs    05/30/19 0417 05/31/19 0409 05/31/19 1207  NA 145 147* 147*  K 3.7 2.8* 3.2*  CL 103 109 109  CO2 22 19* 20*  GLUCOSE 76 104* 94  BUN 85* 83* 87*  CREATININE 3.45* 3.37* 3.54*  CALCIUM 10.0 9.2 9.2   LFT Recent Labs    05/31/19 1207  PROT 7.0  ALBUMIN 3.6  AST 50*  ALT 38  ALKPHOS 82  BILITOT 5.3*  BILIDIR 3.1*  IBILI 2.2*   PT/INR No results for input(s): LABPROT, INR in the last 72 hours. Hepatitis Panel No results for input(s): HEPBSAG, HCVAB, HEPAIGM, HEPBIGM in the last 72 hours. C-Diff No results for input(s): CDIFFTOX in the last 72 hours. Fecal Lactopherrin No results for input(s): FECLLACTOFRN in the last 72 hours.  Studies/Results: Ct Abdomen Pelvis Wo Contrast  Result Date: 05/30/2019 CLINICAL DATA:  Persistent nausea, vomiting, abdominal pain and diarrhea. Symptoms 2 weeks. History of GI bleed. Hemodynamically stable. EXAM: CT ABDOMEN AND PELVIS WITHOUT CONTRAST TECHNIQUE: Multidetector CT imaging of the abdomen and pelvis was performed following  the standard protocol without IV contrast. COMPARISON:  09/11/2018, 08/15/2015 FINDINGS: Lower chest: Subtle patchy density in the lung bases which may be due to atelectasis versus early infection. Atelectasis over the lingula. Stable cardiomegaly. Calcified plaque over the coronary arteries. Mild calcified plaque over the thoracic aorta. Hepatobiliary: Previous cholecystectomy. Stable linear calcification over the left lobe of the liver. Biliary tree is normal. Pancreas: Mild stranding of the fat adjacent the tail of pancreas. Spleen: Normal. Adrenals/Urinary Tract: Stable prominence of the adrenal glands. Kidneys at the lower limits of normal in size with a few stable right renal cysts. No hydronephrosis. Ureters and bladder are unremarkable. Stomach/Bowel: Stomach and small bowel are normal. Appendix is normal. Diverticulosis of the distal descending and sigmoid colon. Very subtle stranding of the fat adjacent the distal descending colon in the left lower quadrant which may be within normal although could be due to mild acute diverticulitis. Short segment of transverse colon extends into a moderate size midline umbilical hernia. Vascular/Lymphatic: Moderate calcified plaque over the abdominal aorta. No adenopathy. Reproductive: Normal. Other: Moderate size midline umbilical hernia containing peritoneal fat and short segment of transverse colon. No evidence of bowel obstruction. Small amount of free fluid in the pelvis. Partially visualized catheter on the most inferior image over the anterior pelvis/perineum. Musculoskeletal: Degenerative change of the spine and hips. Stable grade 1 anterolisthesis of L4 on L5. IMPRESSION: 1. Stranding of the peripancreatic fat adjacent the tail the pancreas which could be seen due to mild acute pancreatitis. Recommend clinical  correlation. 2. Diverticulosis of the descending colon with subtle adjacent stranding of the pericolonic fat as findings may be within normal although  could be seen due to mild acute diverticulitis. 3. Moderate size umbilical hernia containing peritoneal fat and short segment of transverse colon. No evidence of bowel obstruction. 4. Subtle patchy opacification over the lung bases which may be due to atelectasis or early infection. Lingular atelectasis. 5.  Cardiomegaly.  Atherosclerotic coronary artery disease. 6.  Aortic Atherosclerosis (ICD10-I70.0). 7.  Slightly small kidneys.  Several stable right renal cysts. Electronically Signed   By: Marin Olp M.[name].   On: 05/30/2019 11:07   Ct Head Wo Contrast  Result Date: 05/31/2019 CLINICAL DATA:  73 year old female with acute altered level of consciousness. EXAM: CT HEAD WITHOUT CONTRAST TECHNIQUE: Contiguous axial images were obtained from the base of the skull through the vertex without intravenous contrast. COMPARISON:  11/02/2011 CT and 11/03/2011 MR FINDINGS: Brain: No evidence of acute infarction, hemorrhage, hydrocephalus, extra-axial collection or mass lesion/mass effect. Vascular: Carotid atherosclerotic calcifications noted Skull: Normal. Negative for fracture or focal lesion. Sinuses/Orbits: No acute finding. Other: None. IMPRESSION: No evidence of acute intracranial abnormality. Electronically Signed   By: Margarette Canada M.[name].   On: 05/31/2019 13:58   Dg Chest Portable 1 View  Result Date: 05/30/2019 CLINICAL DATA:  Cough and shortness of breath. EXAM: PORTABLE CHEST 1 VIEW COMPARISON:  Two-view chest x-ray the heart is enlarged. Atherosclerotic changes 12/19/2018 are FINDINGS: Noted at the aortic arch. Pulmonary arteries are enlarged. Mild edema is present. No significant effusions are evident. There is no significant airspace consolidation. IMPRESSION: 1. Cardiomegaly with increase in mild edema. Findings are consistent with congestive heart failure. 2. Aortic atherosclerosis. Electronically Signed   By: San Morelle M.[name].   On: 05/30/2019 04:50    Medications:  Scheduled: . allopurinol   100 mg Oral Daily  . insulin aspart  0-9 Units Subcutaneous Q4H  . lactulose  30 g Oral TID  . metoprolol tartrate  5 mg Intravenous Q6H  . mometasone-formoterol  2 puff Inhalation BID  . pantoprazole (PROTONIX) IV  40 mg Intravenous Q12H  . sodium chloride flush  3 mL Intravenous Q12H  . thiamine injection  100 mg Intravenous Daily   Continuous: . sodium chloride    . ciprofloxacin    . dextrose 100 mL/hr at 05/31/19 1800  . famotidine (PEPCID) IV Stopped (05/31/19 1748)  . metronidazole      Assessment/Plan: 1) Encephalopathy. 2) ? Pancreatitis. 3) ? Diverticulitis. 4) History of diastolic CHF. 5) Elevated TB and mildly elevated liver enzymes. 6) CKD.   The patient's mentation is far from her baseline.  The patient is well-known to me.  Her current clinic presentation is very complex.  Nursing does not report any diarrhea since admission.  The abdominal CT scan is suggestive of a mild pancreatitis in the tail of the pancreas and possibly a mild descending colon diverticulitis.  Her lipase is elevated, but it is difficult to interpret with her renal failure.  There is no evidence of any fever, but she is leukopenic.  Her UA, CXR, and head CT are all negative for any overt evidence of infection.  Her BNP is elevated and Dr. Tyrell Antonio feels that she is dehydrated, which is consistent with her history of diarrhea.  The physical examination was negative for any evidence of hepatojugular reflux to explain the hyperbilirubinemia.  There was some evidence of asterixis with her brief ability to keep her hands out streteched.  The case was discussed in detail with Dr. Tyrell Antonio and the family was updated.  Plan: 1) Start Cipro/Flagyl for possible diverticulitis. 2) Start lactulose - oral and enema. 3) I will reassess in the AM.  With her current mentation, she will not undergo any endoscopic procedures.  LOS: 0 days   Kathleen [name] 05/31/2019, 6:31 PM

## 2019-05-31 NOTE — Progress Notes (Signed)
Hypoglycemic Event  CBG: 65  Treatment: D50% IV 12.5g  Symptoms: Not symptomatic  Follow-up CBG: Time:0324 CBG Result: 130  Possible Reasons for Event: No oral intake, pt NPO    Comments/MD notified: On call, Tylene Fantasia, notified via txt/page    Mancel Bale Dunkelberger

## 2019-05-31 NOTE — Progress Notes (Addendum)
PROGRESS NOTE    Kathleen [name]  PNT:[id number] DOB: 1946-08-13 DOA: 05/30/2019 PCP: Nolene Ebbs, MD    Brief Narrative: 73 year old with past medical history significant for hypertension, diastolic heart failure, chronic kidney disease a stage IV, coronary artery disease, diabetes, paroxysmal A. fib history of GI bleed who presented emergency department for evaluation of persistent nausea vomiting abdominal pain and diarrhea.  Patient has been having above symptoms for the last 2 weeks prior to admission.  She has been having trouble swallowing both solid and liquid food.  She was evaluated as an outpatient by Dr. Carlean Jews plan was to proceed with endoscopy colonoscopy.   Assessment & Plan:   Principal Problem:   Dysphagia Active Problems:   Essential hypertension   Diabetes mellitus type 2, controlled (HCC)   HLD (hyperlipidemia)   CKD (chronic kidney disease) stage 4, GFR 15-29 ml/min (HCC)   Chronic diastolic CHF (congestive heart failure) (HCC)   Atrial fibrillation, chronic   GI bleed   Acute metabolic encephalopathy;  Patient sleepy this am, subsequently wakeup a little more. She is currently protecting her airway. She has some right side upper arm weakness, unclear chronicity, per patient have for a while. Family was not aware.  -She received morphine overnight. Stop Narcotics.  -Correct hypernatremia.  -check CT head. If abnormal will proceed with MRI.  -Check ammonia level.   Dysphagia, Nausea, vomiting;  IV fluids.  Plan for endoscopy tomorrow.   Diarrhea;  GI pathogen ordered.   Pancreatitis;  Mild.  Continue with IV fluids.  NPO.  Careful with pain medication and oversedation.   Hypernatremia; change IV fluid to D 5.   Hypokalemia; replete IV.   Metabolic acidosis;  Increase IV fluids.  Check UA evaluate for infection.   Hyperbilirubinemia; Liver enzymes, not significantly elevated.  CT liver without significant abnormality. S/P cholecystectomy.   Check hepatitis panel and trend bili.  GI following.   Pancytopenia;  Hb stable.  Might be related to acute illness.   Pressure Injury stage II POA sacrum;  Local care.   Chronic A fib;  Change metoprolol to IV.   Chronic Kidney diseases stage IV;  Cr baseline 3.1---3.8 Monitor renal function.   Chronic diastolic Heart failure; Appears dehydrated.  Monitor on IV fluids.   Diabetes type 2:; Holding Lantus due to poor oral intake  History of gout continue with allopurinol  Hyperlipidemia hold statins  H/O GI bleed:She has history of angiodysplastic lesions of the stomach and duodenal bulb.  Continue Protonix Pressure Injury 06/22/18 Stage II -  Partial thickness loss of dermis presenting as a shallow open ulcer with a red, pink wound bed without slough. (Active)  06/22/18 2030  Location: Sacrum  Location Orientation: Medial  Staging: Stage II -  Partial thickness loss of dermis presenting as a shallow open ulcer with a red, pink wound bed without slough.  Wound Description (Comments):   Present on Admission: Yes       Estimated body mass index is 32.85 kg/m as calculated from the following:   Height as of this encounter: 5\' 4"  (1.626 m).   Weight as of this encounter: 86.8 kg.   DVT prophylaxis: scd.  Code Status: full code Family Communication: Sister over phone Disposition Plan: remain in the hospital for treatment of hypernatremia, dysphagia, inability to tolerates oral.    Consultants:   GI, Dr Benson Norway   Procedures:      Antimicrobials:  none   Subjective: Patient sleepy, would open eyes to voice,  she was able to say her name. Report mild abdominal discomfort. She is very weak, speech soft, mumble at times.  Unable to tolerate oral intake.   Objective: Vitals:   05/30/19 1825 05/30/19 1944 05/30/19 2200 05/31/19 0455  BP:   (!) 110/50 132/72  Pulse:   85 61  Resp:   20 18  Temp:   97.6 F (36.4 C) 97.6 F (36.4 C)  TempSrc:   Oral Oral   SpO2:  100% 99% 100%  Weight: 86.8 kg     Height: 5\' 4"  (1.626 m)       Intake/Output Summary (Last 24 hours) at 05/31/2019 0727 Last data filed at 05/31/2019 0200 Gross per 24 hour  Intake 1041.63 ml  Output 200 ml  Net 841.63 ml   Filed Weights   05/30/19 1825  Weight: 86.8 kg    Examination:  General exam: Appears calm and comfortable , sleepy Respiratory system: Clear to auscultation. Respiratory effort normal. Cardiovascular system: S1 & S2 heard, RRR. No JVD, murmurs, rubs, gallops or clicks. No pedal edema. Gastrointestinal system: Abdomen is nondistended, soft and nontender. No organomegaly or masses felt. Normal bowel sounds heard. Central nervous system: sleepy, able to follow some command, she is generalized weak, more weakness notice right arm, per patient she has weakness right arm for " a while " Extremities: Symmetric 5 x 5 power. Skin: No rashes, lesions or ulcers    Data Reviewed: I have personally reviewed following labs and imaging studies  CBC: Recent Labs  Lab 05/30/19 0417 05/31/19 0409  WBC 5.1 3.3*  NEUTROABS 3.5  --   HGB 10.9* 10.5*  HCT 36.2 35.1*  MCV 79.7* 80.3  PLT 139* 99*   Basic Metabolic Panel: Recent Labs  Lab 05/30/19 0417 05/31/19 0409  NA 145 147*  K 3.7 2.8*  CL 103 109  CO2 22 19*  GLUCOSE 76 104*  BUN 85* 83*  CREATININE 3.45* 3.37*  CALCIUM 10.0 9.2   GFR: Estimated Creatinine Clearance: 15.6 mL/min (A) (by C-G formula based on SCr of 3.37 mg/dL (H)). Liver Function Tests: Recent Labs  Lab 05/30/19 0417  AST 66*  ALT 40  ALKPHOS 91  BILITOT 5.6*  PROT 7.7  ALBUMIN 4.1   Recent Labs  Lab 05/30/19 0417  LIPASE 127*   No results for input(s): AMMONIA in the last 168 hours. Coagulation Profile: No results for input(s): INR, PROTIME in the last 168 hours. Cardiac Enzymes: Recent Labs  Lab 05/30/19 0417  TROPONINI 0.06*   BNP (last 3 results) No results for input(s): PROBNP in the last 8760 hours.  HbA1C: No results for input(s): HGBA1C in the last 72 hours. CBG: Recent Labs  Lab 05/30/19 1143 05/30/19 1649 05/30/19 2154 05/31/19 0232 05/31/19 0325  GLUCAP 115* 83 80 65* 130*   Lipid Profile: No results for input(s): CHOL, HDL, LDLCALC, TRIG, CHOLHDL, LDLDIRECT in the last 72 hours. Thyroid Function Tests: No results for input(s): TSH, T4TOTAL, FREET4, T3FREE, THYROIDAB in the last 72 hours. Anemia Panel: No results for input(s): VITAMINB12, FOLATE, FERRITIN, TIBC, IRON, RETICCTPCT in the last 72 hours. Sepsis Labs: No results for input(s): PROCALCITON, LATICACIDVEN in the last 168 hours.  Recent Results (from the past 240 hour(s))  SARS Coronavirus 2 (CEPHEID - Performed in [place] hospital lab), Hosp Order     Status: None   Collection Time: 05/30/19  6:17 AM   Specimen: Nasopharyngeal Swab  Result Value Ref Range Status   SARS Coronavirus 2 NEGATIVE NEGATIVE  Final    Comment: (NOTE) If result is NEGATIVE SARS-CoV-2 target nucleic acids are NOT DETECTED. The SARS-CoV-2 RNA is generally detectable in upper and lower  respiratory specimens during the acute phase of infection. The lowest  concentration of SARS-CoV-2 viral copies this assay can detect is 250  copies / mL. A negative result does not preclude SARS-CoV-2 infection  and should not be used as the sole basis for treatment or other  patient management decisions.  A negative result may occur with  improper specimen collection / handling, submission of specimen other  than nasopharyngeal swab, presence of viral mutation(s) within the  areas targeted by this assay, and inadequate number of viral copies  (<250 copies / mL). A negative result must be combined with clinical  observations, patient history, and epidemiological information. If result is POSITIVE SARS-CoV-2 target nucleic acids are DETECTED. The SARS-CoV-2 RNA is generally detectable in upper and lower  respiratory specimens dur ing the acute  phase of infection.  Positive  results are indicative of active infection with SARS-CoV-2.  Clinical  correlation with patient history and other diagnostic information is  necessary to determine patient infection status.  Positive results do  not rule out bacterial infection or co-infection with other viruses. If result is PRESUMPTIVE POSTIVE SARS-CoV-2 nucleic acids MAY BE PRESENT.   A presumptive positive result was obtained on the submitted specimen  and confirmed on repeat testing.  While 2019 novel coronavirus  (SARS-CoV-2) nucleic acids may be present in the submitted sample  additional confirmatory testing may be necessary for epidemiological  and / or clinical management purposes  to differentiate between  SARS-CoV-2 and other Sarbecovirus currently known to infect humans.  If clinically indicated additional testing with an alternate test  methodology [phone]) is advised. The SARS-CoV-2 RNA is generally  detectable in upper and lower respiratory sp ecimens during the acute  phase of infection. The expected result is Negative. Fact Sheet for Patients:  StrictlyIdeas.no Fact Sheet for Healthcare Providers: BankingDealers.co.za This test is not yet approved or cleared by the [place] FDA and has been authorized for detection and/or diagnosis of SARS-CoV-2 by FDA under an Emergency Use Authorization (EUA).  This EUA will remain in effect (meaning this test can be used) for the duration of the COVID-19 declaration under Section 564(b)(1) of the Act, 21 U.S.C. section 360bbb-3(b)(1), unless the authorization is terminated or revoked sooner. Performed at [facility], [place] [address] [place], [place] [address]          Radiology Studies: Ct Abdomen Pelvis Wo Contrast  Result Date: 05/30/2019 CLINICAL DATA:  Persistent nausea, vomiting, abdominal pain and diarrhea. Symptoms 2 weeks. History of GI bleed.  Hemodynamically stable. EXAM: CT ABDOMEN AND PELVIS WITHOUT CONTRAST TECHNIQUE: Multidetector CT imaging of the abdomen and pelvis was performed following the standard protocol without IV contrast. COMPARISON:  09/11/2018, 08/15/2015 FINDINGS: Lower chest: Subtle patchy density in the lung bases which may be due to atelectasis versus early infection. Atelectasis over the lingula. Stable cardiomegaly. Calcified plaque over the coronary arteries. Mild calcified plaque over the thoracic aorta. Hepatobiliary: Previous cholecystectomy. Stable linear calcification over the left lobe of the liver. Biliary tree is normal. Pancreas: Mild stranding of the fat adjacent the tail of pancreas. Spleen: Normal. Adrenals/Urinary Tract: Stable prominence of the adrenal glands. Kidneys at the lower limits of normal in size with a few stable right renal cysts. No hydronephrosis. Ureters and bladder are unremarkable. Stomach/Bowel: Stomach and small bowel are normal.  Appendix is normal. Diverticulosis of the distal descending and sigmoid colon. Very subtle stranding of the fat adjacent the distal descending colon in the left lower quadrant which may be within normal although could be due to mild acute diverticulitis. Short segment of transverse colon extends into a moderate size midline umbilical hernia. Vascular/Lymphatic: Moderate calcified plaque over the abdominal aorta. No adenopathy. Reproductive: Normal. Other: Moderate size midline umbilical hernia containing peritoneal fat and short segment of transverse colon. No evidence of bowel obstruction. Small amount of free fluid in the pelvis. Partially visualized catheter on the most inferior image over the anterior pelvis/perineum. Musculoskeletal: Degenerative change of the spine and hips. Stable grade 1 anterolisthesis of L4 on L5. IMPRESSION: 1. Stranding of the peripancreatic fat adjacent the tail the pancreas which could be seen due to mild acute pancreatitis. Recommend clinical  correlation. 2. Diverticulosis of the descending colon with subtle adjacent stranding of the pericolonic fat as findings may be within normal although could be seen due to mild acute diverticulitis. 3. Moderate size umbilical hernia containing peritoneal fat and short segment of transverse colon. No evidence of bowel obstruction. 4. Subtle patchy opacification over the lung bases which may be due to atelectasis or early infection. Lingular atelectasis. 5.  Cardiomegaly.  Atherosclerotic coronary artery disease. 6.  Aortic Atherosclerosis (ICD10-I70.0). 7.  Slightly small kidneys.  Several stable right renal cysts. Electronically Signed   By: Marin Olp M.D.   On: 05/30/2019 11:07   Dg Chest Portable 1 View  Result Date: 05/30/2019 CLINICAL DATA:  Cough and shortness of breath. EXAM: PORTABLE CHEST 1 VIEW COMPARISON:  Two-view chest x-ray the heart is enlarged. Atherosclerotic changes 12/19/2018 are FINDINGS: Noted at the aortic arch. Pulmonary arteries are enlarged. Mild edema is present. No significant effusions are evident. There is no significant airspace consolidation. IMPRESSION: 1. Cardiomegaly with increase in mild edema. Findings are consistent with congestive heart failure. 2. Aortic atherosclerosis. Electronically Signed   By: San Morelle M.D.   On: 05/30/2019 04:50        Scheduled Meds: . allopurinol  100 mg Oral Daily  . dextrose      . insulin aspart  0-9 Units Subcutaneous Q4H  . isosorbide-hydrALAZINE  1 tablet Oral TID  . metoprolol tartrate  25 mg Oral BID  . mometasone-formoterol  2 puff Inhalation BID  . pantoprazole  40 mg Oral Daily  . pravastatin  80 mg Oral Daily  . sodium chloride flush  3 mL Intravenous Q12H   Continuous Infusions: . sodium chloride    . sodium chloride 75 mL/hr at 05/31/19 0200  . famotidine (PEPCID) IV Stopped (05/30/19 1828)     LOS: 0 days    Time spent: 35 minutes.     Elmarie Shiley, MD [facility] Pager  [phone]  If 7PM-7AM, please contact night-coverage [email or web address] Password [facility] 05/31/2019, 7:27 AM

## 2019-06-01 ENCOUNTER — Inpatient Hospital Stay (HOSPITAL_COMMUNITY): Payer: Medicare Other

## 2019-06-01 ENCOUNTER — Encounter (HOSPITAL_COMMUNITY)
Admission: EM | Disposition: A | Discharge status: Hospice | Payer: Self-pay | Source: Home / Self Care | Attending: Internal Medicine

## 2019-06-01 LAB — LIPASE, BLOOD: Lipase: 175 U/L — ABNORMAL HIGH (ref 11–51)

## 2019-06-01 LAB — HEPATIC FUNCTION PANEL
ALT: 35 U/L (ref 0–44)
AST: 44 U/L — ABNORMAL HIGH (ref 15–41)
Albumin: 3.1 g/dL — ABNORMAL LOW (ref 3.5–5.0)
Alkaline Phosphatase: 73 U/L (ref 38–126)
Bilirubin, Direct: 2.9 mg/dL — ABNORMAL HIGH (ref 0.0–0.2)
Indirect Bilirubin: 1.5 mg/dL — ABNORMAL HIGH (ref 0.3–0.9)
Total Bilirubin: 4.4 mg/dL — ABNORMAL HIGH (ref 0.3–1.2)
Total Protein: 6.3 g/dL — ABNORMAL LOW (ref 6.5–8.1)

## 2019-06-01 LAB — GLUCOSE, CAPILLARY
Glucose-Capillary: 106 mg/dL — ABNORMAL HIGH (ref 70–99)
Glucose-Capillary: 97 mg/dL (ref 70–99)
Glucose-Capillary: 120 mg/dL — ABNORMAL HIGH (ref 70–99)
Glucose-Capillary: 103 mg/dL — ABNORMAL HIGH (ref 70–99)
Glucose-Capillary: 110 mg/dL — ABNORMAL HIGH (ref 70–99)

## 2019-06-01 LAB — BASIC METABOLIC PANEL
Anion gap: 15 (ref 5–15)
BUN: 86 mg/dL — ABNORMAL HIGH (ref 8–23)
CO2: 20 mmol/L — ABNORMAL LOW (ref 22–32)
Calcium: 8.5 mg/dL — ABNORMAL LOW (ref 8.9–10.3)
Chloride: 109 mmol/L (ref 98–111)
Creatinine, Ser: 3.77 mg/dL — ABNORMAL HIGH (ref 0.44–1.00)
GFR calc Af Amer: 13 mL/min — ABNORMAL LOW (ref >60)
GFR calc non Af Amer: 11 mL/min — ABNORMAL LOW (ref >60)
Glucose, Bld: 108 mg/dL — ABNORMAL HIGH (ref 70–99)
Potassium: 3.3 mmol/L — ABNORMAL LOW (ref 3.5–5.1)
Sodium: 144 mmol/L (ref 135–145)

## 2019-06-01 LAB — CBC
HCT: 34 % — ABNORMAL LOW (ref 36.0–46.0)
Hemoglobin: 10.7 g/dL — ABNORMAL LOW (ref 12.0–15.0)
MCH: 25 pg — ABNORMAL LOW (ref 26.0–34.0)
MCHC: 31.5 g/dL (ref 30.0–36.0)
MCV: 79.4 fL — ABNORMAL LOW (ref 80.0–100.0)
Platelets: 86 10*3/uL — ABNORMAL LOW (ref 150–400)
RBC: 4.28 MIL/uL (ref 3.87–5.11)
RDW: 26.2 % — ABNORMAL HIGH (ref 11.5–15.5)
WBC: 4 10*3/uL (ref 4.0–10.5)
nRBC: 1 % — ABNORMAL HIGH (ref 0.0–0.2)

## 2019-06-01 LAB — URINE CULTURE: Culture: NO GROWTH

## 2019-06-01 LAB — HEPATITIS PANEL, ACUTE
HCV Ab: 0.2 s/co ratio (ref 0.0–0.9)
Hep A IgM: NEGATIVE
Hep B C IgM: NEGATIVE
Hepatitis B Surface Ag: NEGATIVE

## 2019-06-01 LAB — PATHOLOGIST SMEAR REVIEW

## 2019-06-01 SURGERY — ESOPHAGOGASTRODUODENOSCOPY (EGD) WITH PROPOFOL
Anesthesia: Monitor Anesthesia Care

## 2019-06-01 MED ORDER — LACTULOSE ENEMA
300.0000 mL | Freq: Every day | ORAL | Status: DC
  Administered 2019-06-01: 300 mL via RECTAL
  Filled 2019-06-01 (×2): qty 300

## 2019-06-01 MED ORDER — SODIUM CHLORIDE 0.9 % IV BOLUS
500.0000 mL | Freq: Once | INTRAVENOUS | Status: AC
  Administered 2019-06-01: 03:00:00 500 mL via INTRAVENOUS

## 2019-06-01 MED ORDER — DILTIAZEM HCL-DEXTROSE 100-5 MG/100ML-% IV SOLN (PREMIX)
5.0000 mg/h | INTRAVENOUS | Status: DC
  Filled 2019-06-01: qty 100

## 2019-06-01 MED ORDER — METOPROLOL TARTRATE 5 MG/5ML IV SOLN
2.5000 mg | Freq: Once | INTRAVENOUS | Status: AC
  Administered 2019-06-01: 2.5 mg via INTRAVENOUS
  Filled 2019-06-01: qty 5

## 2019-06-01 MED ORDER — DILTIAZEM HCL 100 MG IV SOLR
5.0000 mg/h | INTRAVENOUS | Status: DC
  Administered 2019-06-01: 10 mg/h via INTRAVENOUS
  Administered 2019-06-01: 5 mg/h via INTRAVENOUS
  Administered 2019-06-02: 10 mg/h via INTRAVENOUS
  Filled 2019-06-01 (×5): qty 100

## 2019-06-01 NOTE — Progress Notes (Signed)
Pt's HR maintaining in the 110-120's. Urine output 145ml this shift. Bladder scan performed showing max retention of 79ml. On call notified via text/page, and orders for 500cc bolus given. Pt comfortable without complaints at this time.

## 2019-06-01 NOTE — Progress Notes (Signed)
SLP Cancellation Note  Patient Details Name: Kathleen [name] MRN: [id number] DOB: March 08, 1946   Cancelled treatment:       Reason Eval/Treat Not Completed: Patient not medically ready. Planned EGD was cancelled today as patient's mentation is not adequate. Spoke with MD and SLP will need to hold off on swallow assessment at this time. Will follow for patient readiness.    Nadara Mode Tarrell 06/01/2019, 10:01 AM   Sonia Baller, MA, CCC-SLP Speech Therapy [facility] Pager: [phone]

## 2019-06-01 NOTE — Progress Notes (Signed)
Pt HR sustaining between 110-120 with occasional leaps into the 130's. On call notified. See new orders.

## 2019-06-01 NOTE — Progress Notes (Addendum)
Subjective: No acute events.  Objective: Vital signs in last 24 hours: Temp:  [97.5 F (36.4 C)-98.3 F (36.8 C)] 98.3 F (36.8 C) (06/19 0507) Pulse Rate:  [75-134] 134 (06/19 0507) Resp:  [16-18] 18 (06/18 2051) BP: (110-134)/(68-77) 134/74 (06/19 0507) SpO2:  [97 %-100 %] 100 % (06/19 0507) Last BM Date: 05/31/19  Intake/Output from previous day: 06/18 0701 - 06/19 0700 In: 2172.4 [I.V.:1075; IV Piggyback:1097.4] Out: 150 [Urine:150] Intake/Output this shift: No intake/output data recorded.  General appearance: much more alert and responsive GI: tender in the upper abdomen Extremities: unable to assess for asterixis  Lab Results: Recent Labs    05/30/19 0417 05/31/19 0409 06/01/19 0406  WBC 5.1 3.3* 4.0  HGB 10.9* 10.5* 10.7*  HCT 36.2 35.1* 34.0*  PLT 139* 99* 86*   BMET Recent Labs    05/31/19 0409 05/31/19 1207 06/01/19 0406  NA 147* 147* 144  K 2.8* 3.2* 3.3*  CL 109 109 109  CO2 19* 20* 20*  GLUCOSE 104* 94 108*  BUN 83* 87* 86*  CREATININE 3.37* 3.54* 3.77*  CALCIUM 9.2 9.2 8.5*   LFT Recent Labs    06/01/19 0406  PROT 6.3*  ALBUMIN 3.1*  AST 44*  ALT 35  ALKPHOS 73  BILITOT 4.4*  BILIDIR 2.9*  IBILI 1.5*   PT/INR No results for input(s): LABPROT, INR in the last 72 hours. Hepatitis Panel No results for input(s): HEPBSAG, HCVAB, HEPAIGM, HEPBIGM in the last 72 hours. C-Diff No results for input(s): CDIFFTOX in the last 72 hours. Fecal Lactopherrin No results for input(s): FECLLACTOFRN in the last 72 hours.  Studies/Results: Ct Abdomen Pelvis Wo Contrast  Result Date: 05/30/2019 CLINICAL DATA:  Persistent nausea, vomiting, abdominal pain and diarrhea. Symptoms 2 weeks. History of GI bleed. Hemodynamically stable. EXAM: CT ABDOMEN AND PELVIS WITHOUT CONTRAST TECHNIQUE: Multidetector CT imaging of the abdomen and pelvis was performed following the standard protocol without IV contrast. COMPARISON:  09/11/2018, 08/15/2015 FINDINGS:  Lower chest: Subtle patchy density in the lung bases which may be due to atelectasis versus early infection. Atelectasis over the lingula. Stable cardiomegaly. Calcified plaque over the coronary arteries. Mild calcified plaque over the thoracic aorta. Hepatobiliary: Previous cholecystectomy. Stable linear calcification over the left lobe of the liver. Biliary tree is normal. Pancreas: Mild stranding of the fat adjacent the tail of pancreas. Spleen: Normal. Adrenals/Urinary Tract: Stable prominence of the adrenal glands. Kidneys at the lower limits of normal in size with a few stable right renal cysts. No hydronephrosis. Ureters and bladder are unremarkable. Stomach/Bowel: Stomach and small bowel are normal. Appendix is normal. Diverticulosis of the distal descending and sigmoid colon. Very subtle stranding of the fat adjacent the distal descending colon in the left lower quadrant which may be within normal although could be due to mild acute diverticulitis. Short segment of transverse colon extends into a moderate size midline umbilical hernia. Vascular/Lymphatic: Moderate calcified plaque over the abdominal aorta. No adenopathy. Reproductive: Normal. Other: Moderate size midline umbilical hernia containing peritoneal fat and short segment of transverse colon. No evidence of bowel obstruction. Small amount of free fluid in the pelvis. Partially visualized catheter on the most inferior image over the anterior pelvis/perineum. Musculoskeletal: Degenerative change of the spine and hips. Stable grade 1 anterolisthesis of L4 on L5. IMPRESSION: 1. Stranding of the peripancreatic fat adjacent the tail the pancreas which could be seen due to mild acute pancreatitis. Recommend clinical correlation. 2. Diverticulosis of the descending colon with subtle adjacent stranding of the  pericolonic fat as findings may be within normal although could be seen due to mild acute diverticulitis. 3. Moderate size umbilical hernia containing  peritoneal fat and short segment of transverse colon. No evidence of bowel obstruction. 4. Subtle patchy opacification over the lung bases which may be due to atelectasis or early infection. Lingular atelectasis. 5.  Cardiomegaly.  Atherosclerotic coronary artery disease. 6.  Aortic Atherosclerosis (ICD10-I70.0). 7.  Slightly small kidneys.  Several stable right renal cysts. Electronically Signed   By: Marin Olp M.D.   On: 05/30/2019 11:07   Ct Head Wo Contrast  Result Date: 05/31/2019 CLINICAL DATA:  73 year old female with acute altered level of consciousness. EXAM: CT HEAD WITHOUT CONTRAST TECHNIQUE: Contiguous axial images were obtained from the base of the skull through the vertex without intravenous contrast. COMPARISON:  11/02/2011 CT and 11/03/2011 MR FINDINGS: Brain: No evidence of acute infarction, hemorrhage, hydrocephalus, extra-axial collection or mass lesion/mass effect. Vascular: Carotid atherosclerotic calcifications noted Skull: Normal. Negative for fracture or focal lesion. Sinuses/Orbits: No acute finding. Other: None. IMPRESSION: No evidence of acute intracranial abnormality. Electronically Signed   By: Margarette Canada M.D.   On: 05/31/2019 13:58   Mr Brain Wo Contrast  Result Date: 05/31/2019 CLINICAL DATA:  73 y/o  F; altered mental status. EXAM: MRI HEAD WITHOUT CONTRAST TECHNIQUE: Multiplanar, multiecho pulse sequences of the brain and surrounding structures were obtained without intravenous contrast. COMPARISON:  05/31/2019 CT head FINDINGS: Brain: Motion degradation of several sequences. No acute infarction, hemorrhage, hydrocephalus, extra-axial collection or mass lesion. Vascular: Normal flow voids. Skull and upper cervical spine: Normal marrow signal. Sinuses/Orbits: Negative. Other: None. IMPRESSION: No acute intracranial abnormality. Unremarkable MRI of the brain for age. Electronically Signed   By: Kristine Garbe M.D.   On: 05/31/2019 22:08    Medications:   Scheduled: . allopurinol  100 mg Oral Daily  . insulin aspart  0-9 Units Subcutaneous Q4H  . lactulose  30 g Oral TID  . lactulose  300 mL Rectal Daily  . metoprolol tartrate  5 mg Intravenous Q6H  . mometasone-formoterol  2 puff Inhalation BID  . pantoprazole (PROTONIX) IV  40 mg Intravenous Q12H  . sodium chloride flush  3 mL Intravenous Q12H  . thiamine injection  100 mg Intravenous Daily   Continuous: . sodium chloride    . ciprofloxacin Stopped (05/31/19 2259)  . dextrose 75 mL/hr at 06/01/19 0600  . famotidine (PEPCID) IV Stopped (05/31/19 1748)  . metronidazole Stopped (06/01/19 0021)    Assessment/Plan: 1) Encephalopathy - improved. 2) Presumed pancreatitis. 3) ? Diverticulitis. 4) CKD. 5) Tachycardia/afib.   Mentally she is much improved, but still far from her baseline.  She is able to converse with me and she questioning why she was feeling poorly.  She denies any "gagging" sensation or diarrhea.  Her lipase value has dropped significantly, even though her creatinine has increased.  With the lipase, her upper abdominal pain, and the suggestion of pancreatitis on the CT scan, it appears that she does have a mild pancreatitis.  Hopefully with continued supportive care her clinical status will continue to improve.  Plan: 1) Continue with lactulose.  She was only able to obtain the lactulose enema, but she may be able to tolerate PO today. 2) Continue with antibiotics. 3) IV hydration and Cardizem per Dr. Tyrell Antonio. 4) Cancel the EGD.   LOS: 1 day   Ceirra Belli D 06/01/2019, 7:29 AM

## 2019-06-01 NOTE — Progress Notes (Signed)
PT Cancellation Note  Patient Details Name: Kathleen [name] MRN: [id number] DOB: November 04, 1946   Cancelled Treatment:     unable to arouse enough to participate in am then unable to return in PM.  Pt has ben evaluated with rec for [facility] PT also needing family support as pt lives home alone.    Rica Koyanagi  PTA Acute  Rehabilitation Services Pager      [phone] Office      [phone]

## 2019-06-01 NOTE — Consult Note (Signed)
[facility]  HISTORY AND PHYSICAL  Kathleen [name] is an 73 y.o. female.    Chief Complaint: abd pain, n/v  HPI: Pt is a 15F with a PMH significant for HTN, HLD, CKD IV, chronic diastolic CHF, Afib, and prior history of GI bleed who is now seen at the request of Dr. Tyrell Antonio for evaluation and recommendations surrounding AKI on CKD and oliguria.    Pt history is obtained from chart review and from pt's sister primarily.  Pt can't give a good history.  She has had 2 weeks of n/v and inability to tolerate PO.  Was seen by Dr. Benson Norway 6/12 and the plan was to have EGD and c-scope.  Initially, CXR was indicative of pulm edema and was started on lasix. She was also found to have pancreatitis and has been treated with supportive care.  EGD and c-scope are on hold.  Cr baseline is 2.8-3.0 and is up to 3.77 today.  She has ~150-200 of urine output and in this setting we are asked to see.  Bladder scans have revealed   She notes copious diarrhea for which she is requiring Flexiseal.  650 mL charted already today.  No real nephrotoxic meds.  No episodes of IV contrast.  Hospital course has been complicated by encephalopathy for which CT scan and MRI were negative.  Narcotics have been minimized.  Mild hypernatremia is being treated with hypotonic fluids.  Had episode of Afib last night which is being treated with a dilt gtt and is s/p NS bolus.  On cipro/flagyl for colitis and lactulose for possible hepatic encephalopathy.  Sister Martin Majestic is at bedside.  She helps provide history.  Pt reports she's not as uncomfortable as prior.     PMH: Past Medical History:  Diagnosis Date  . Arthritis   . Blood transfusion    no side affects  . CHF (congestive heart failure) ([place])   . Coronary artery disease    Cypher stent to the RCA in 2003  . Diabetes mellitus    Borderline  . GERD (gastroesophageal reflux disease)   . Hypertension   . Shortness of breath   . Sleep apnea    PSH: Past Surgical  History:  Procedure Laterality Date  . BIOPSY  06/06/2018   Procedure: BIOPSY;  Surgeon: Otis Brace, MD;  Location: [facility];  Service: Gastroenterology;;  . CARDIOVERSION N/A 03/17/2015   Procedure: CARDIOVERSION;  Surgeon: Sueanne Margarita, MD;  Location: [facility];  Service: Cardiovascular;  Laterality: N/A;  . CHOLECYSTECTOMY    . CORONARY ANGIOPLASTY WITH STENT PLACEMENT    . ENTEROSCOPY N/A 06/28/2018   Procedure: ENTEROSCOPY;  Surgeon: Carol Ada, MD;  Location: [facility];  Service: Endoscopy;  Laterality: N/A;  . ENTEROSCOPY N/A 09/12/2018   Procedure: ENTEROSCOPY;  Surgeon: Carol Ada, MD;  Location: [facility];  Service: Endoscopy;  Laterality: N/A;  . ENTEROSCOPY N/A 12/21/2018   Procedure: ENTEROSCOPY;  Surgeon: Carol Ada, MD;  Location: [facility];  Service: Endoscopy;  Laterality: N/A;  . ENTEROSCOPY N/A 02/03/2019   Procedure: ENTEROSCOPY;  Surgeon: Ladene Artist, MD;  Location: [facility];  Service: Endoscopy;  Laterality: N/A;  . ESOPHAGOGASTRODUODENOSCOPY (EGD) WITH PROPOFOL N/A 06/06/2018   Procedure: ESOPHAGOGASTRODUODENOSCOPY (EGD) WITH PROPOFOL;  Surgeon: Otis Brace, MD;  [facility];  Service: Gastroenterology;  Laterality: N/A;  . GIVENS CAPSULE STUDY N/A 06/25/2018   Procedure: GIVENS CAPSULE STUDY;  Surgeon: Carol Ada, MD;  [facility];  Service: Endoscopy;  Laterality: N/A;  .  HOT HEMOSTASIS N/A 06/06/2018   Procedure: HOT HEMOSTASIS (ARGON PLASMA COAGULATION/BICAP);  Surgeon: Otis Brace, MD;  Location: [facility];  Service: Gastroenterology;  Laterality: N/A;  . HOT HEMOSTASIS N/A 06/28/2018   Procedure: HOT HEMOSTASIS (ARGON PLASMA COAGULATION/BICAP);  Surgeon: Carol Ada, MD;  Location: [facility];  Service: Endoscopy;  Laterality: N/A;  . HOT HEMOSTASIS N/A 12/21/2018   Procedure: HOT HEMOSTASIS (ARGON PLASMA COAGULATION/BICAP);  Surgeon: Carol Ada, MD;  Location: [facility];  Service:  Endoscopy;  Laterality: N/A;  . HOT HEMOSTASIS N/A 02/03/2019   Procedure: HOT HEMOSTASIS (ARGON PLASMA COAGULATION/BICAP);  Surgeon: Ladene Artist, MD;  Location: [facility];  Service: Endoscopy;  Laterality: N/A;  . TOTAL KNEE ARTHROPLASTY       Past Medical History:  Diagnosis Date  . Arthritis   . Blood transfusion    no side affects  . CHF (congestive heart failure) ([place])   . Coronary artery disease    Cypher stent to the RCA in 2003  . Diabetes mellitus    Borderline  . GERD (gastroesophageal reflux disease)   . Hypertension   . Shortness of breath   . Sleep apnea     Medications:   Scheduled: . allopurinol  100 mg Oral Daily  . insulin aspart  0-9 Units Subcutaneous Q4H  . lactulose  30 g Oral TID  . lactulose  300 mL Rectal Daily  . metoprolol tartrate  5 mg Intravenous Q6H  . mometasone-formoterol  2 puff Inhalation BID  . pantoprazole (PROTONIX) IV  40 mg Intravenous Q12H  . sodium chloride flush  3 mL Intravenous Q12H  . thiamine injection  100 mg Intravenous Daily    Medications Prior to Admission  Medication Sig Dispense Refill  . albuterol (PROVENTIL HFA;VENTOLIN HFA) 108 (90 Base) MCG/ACT inhaler Inhale 1-2 puffs into the lungs every 4 (four) hours as needed for wheezing or shortness of breath. 1 Inhaler 0  . albuterol (PROVENTIL) (2.5 MG/3ML) 0.083% nebulizer solution Take 2.5 mg by nebulization every 6 (six) hours as needed for wheezing or shortness of breath.    . allopurinol (ZYLOPRIM) 100 MG tablet Take 100 mg by mouth daily.    . colchicine 0.6 MG tablet Take 0.6 mg by mouth daily as needed (gout).    . cyclobenzaprine (FLEXERIL) 10 MG tablet Take 10 mg by mouth 2 (two) times daily as needed for muscle spasms.     . famotidine (PEPCID) 20 MG tablet Take 20 mg by mouth at bedtime.     . furosemide (LASIX) 80 MG tablet Take 0.5 tablets (40 mg total) by mouth 2 (two) times daily. Resume only on 12/26/2018 (Patient taking differently: Take 40-80 mg by  mouth See admin instructions. Take 80 mg in the morning and 40 mg at bedtime)    . isosorbide-hydrALAZINE (BIDIL) 20-37.5 MG tablet Take 1 tablet by mouth 3 (three) times daily. 90 tablet 0  . LANTUS SOLOSTAR 100 UNIT/ML Solostar Pen Inject 10 Units into the skin daily. Please take only if your blood glucose levels are greater than 180. (Patient taking differently: Inject 10 Units into the skin daily as needed (BS >180). ) 15 mL 11  . metoprolol tartrate (LOPRESSOR) 50 MG tablet Take 0.5 tablets (25 mg total) by mouth 2 (two) times daily. (Patient taking differently: Take 50 mg by mouth daily. )    . pantoprazole (PROTONIX) 40 MG tablet Take 1 tablet (40 mg total) by mouth daily. 30 tablet 1  . pravastatin (PRAVACHOL) 80 MG tablet  Take 80 mg by mouth daily.    . SYMBICORT 80-4.5 MCG/ACT inhaler Inhale 2 puffs into the lungs 2 (two) times daily.  0    ALLERGIES:   Allergies  Allergen Reactions  . Penicillins Hives and Shortness Of Breath    Tolerates Ceftin, Ceftriaxone, and Cefepime Has patient had a PCN reaction causing immediate rash, facial/tongue/throat swelling, SOB or lightheadedness with hypotension: Y Has patient had a PCN reaction causing severe rash involving mucus membranes or skin necrosis: Y Has patient had a PCN reaction that required hospitalization: Y Has patient had a PCN reaction occurring within the last 10 years: N     FAM HX: Family History  Problem Relation Age of Onset  . Hypertension Father   . Heart disease Father   . Gout Father   . Arthritis Father   . Heart attack Father   . Heart attack Brother   . Hypertension Brother   . Hypertension Sister   . Stroke Neg Hx     Social History:   reports that she quit smoking about 5 years ago. Her smoking use included cigarettes. She has a 9.00 pack-year smoking history. She has never used smokeless tobacco. She reports that she does not drink alcohol or use drugs.  ROS: ROS: not able to be obtained d/t acute  encephalopathy  Blood pressure (!) 110/58, pulse 97, temperature 98 F (36.7 C), temperature source Oral, resp. rate 17, height 5\' 4"  (1.626 m), weight 86.8 kg, SpO2 100 %. PHYSICAL EXAM: Physical Exam  GEN: older woman, sitting in bed, sleeping and arousable to voice and especially responds when sister in the room HEENT EOMI PERRL, very dry MM NECK no frank JVD at 30 degrees PULM normal WOB, clear anteriorly, poor air movement so can't really assess posteriorly very well CV RRR no m/r/g ABD hypoactive bowel sounds, mildly diffusely tender  EXT no LE edema NEURO sleeping, arousable, intermittently confused SKIN poor skin turgor MSK no effusions   Results for orders placed or performed during the hospital encounter of 05/30/19 (from the past 48 hour(s))  Glucose, capillary     Status: None   Collection Time: 05/30/19  4:49 PM  Result Value Ref Range   Glucose-Capillary 83 70 - 99 mg/dL  Glucose, capillary     Status: None   Collection Time: 05/30/19  9:54 PM  Result Value Ref Range   Glucose-Capillary 80 70 - 99 mg/dL  Glucose, capillary     Status: Abnormal   Collection Time: 05/31/19  2:32 AM  Result Value Ref Range   Glucose-Capillary 65 (L) 70 - 99 mg/dL  Glucose, capillary     Status: Abnormal   Collection Time: 05/31/19  3:25 AM  Result Value Ref Range   Glucose-Capillary 130 (H) 70 - 99 mg/dL  Basic metabolic panel     Status: Abnormal   Collection Time: 05/31/19  4:09 AM  Result Value Ref Range   Sodium 147 (H) 135 - 145 mmol/L   Potassium 2.8 (L) 3.5 - 5.1 mmol/L    Comment: DELTA CHECK NOTED   Chloride 109 98 - 111 mmol/L   CO2 19 (L) 22 - 32 mmol/L   Glucose, Bld 104 (H) 70 - 99 mg/dL   BUN 83 (H) 8 - 23 mg/dL   Creatinine, Ser 3.37 (H) 0.44 - 1.00 mg/dL   Calcium 9.2 8.9 - 10.3 mg/dL   GFR calc non Af Amer 13 (L) >60 mL/min   GFR calc Af Amer 15 (L) >60  mL/min   Anion gap 19 (H) 5 - 15    Comment: Performed at [facility], [place]  [address], [place], [place] [address]  CBC     Status: Abnormal   Collection Time: 05/31/19  4:09 AM  Result Value Ref Range   WBC 3.3 (L) 4.0 - 10.5 K/uL   RBC 4.37 3.87 - 5.11 MIL/uL   Hemoglobin 10.5 (L) 12.0 - 15.0 g/dL   HCT 35.1 (L) 36.0 - 46.0 %   MCV 80.3 80.0 - 100.0 fL   MCH 24.0 (L) 26.0 - 34.0 pg   MCHC 29.9 (L) 30.0 - 36.0 g/dL   RDW 25.7 (H) 11.5 - 15.5 %   Platelets 99 (L) 150 - 400 K/uL    Comment: REPEATED TO VERIFY PLATELET COUNT CONFIRMED BY SMEAR SPECIMEN CHECKED FOR CLOTS Immature Platelet Fraction may be clinically indicated, consider ordering this additional test [id number]    nRBC 0.9 (H) 0.0 - 0.2 %    Comment: Performed at [facility], [place] [address] [place], [place] [address]  Glucose, capillary     Status: None   Collection Time: 05/31/19  8:03 AM  Result Value Ref Range   Glucose-Capillary 72 70 - 99 mg/dL  Ammonia     Status: None   Collection Time: 05/31/19 12:07 PM  Result Value Ref Range   Ammonia 35 9 - 35 umol/L    Comment: Performed at [facility], [place] [address] [place], [place] [address]  Hepatic function panel     Status: Abnormal   Collection Time: 05/31/19 12:07 PM  Result Value Ref Range   Total Protein 7.0 6.5 - 8.1 g/dL   Albumin 3.6 3.5 - 5.0 g/dL   AST 50 (H) 15 - 41 U/L   ALT 38 0 - 44 U/L   Alkaline Phosphatase 82 38 - 126 U/L   Total Bilirubin 5.3 (H) 0.3 - 1.2 mg/dL   Bilirubin, Direct 3.1 (H) 0.0 - 0.2 mg/dL   Indirect Bilirubin 2.2 (H) 0.3 - 0.9 mg/dL    Comment: Performed at [facility], [place] [address], [place], [place] [address]  Hepatitis panel, acute     Status: None   Collection Time: 05/31/19 12:07 PM  Result Value Ref Range   Hepatitis B Surface Ag Negative Negative   HCV Ab 0.2 0.0 - 0.9 s/co ratio    Comment: (NOTE)                                  Negative:     < 0.8                             Indeterminate: 0.8 - 0.9                                   Positive:     > 0.9 The CDC recommends that a positive HCV antibody result be followed up with a HCV Nucleic Acid Amplification test ([id number]). Performed At: [facility] [place], [place] [id number] Rush Farmer MD [id number]    Hep A IgM Negative Negative   Hep B C IgM Negative Negative  Basic metabolic panel     Status: Abnormal   Collection Time: 05/31/19 12:07 PM  Result Value  Ref Range   Sodium 147 (H) 135 - 145 mmol/L   Potassium 3.2 (L) 3.5 - 5.1 mmol/L   Chloride 109 98 - 111 mmol/L   CO2 20 (L) 22 - 32 mmol/L   Glucose, Bld 94 70 - 99 mg/dL   BUN 87 (H) 8 - 23 mg/dL   Creatinine, Ser 3.54 (H) 0.44 - 1.00 mg/dL   Calcium 9.2 8.9 - 10.3 mg/dL   GFR calc non Af Amer 12 (L) >60 mL/min   GFR calc Af Amer 14 (L) >60 mL/min   Anion gap 18 (H) 5 - 15    Comment: Performed at [facility], [place] [address], [place], [place] [address]  Lipase, blood     Status: Abnormal   Collection Time: 05/31/19 12:22 PM  Result Value Ref Range   Lipase 579 (H) 11 - 51 U/L    Comment: RESULTS CONFIRMED BY MANUAL DILUTION Performed at [facility], [place] [address] [place], [place] [address]   Glucose, capillary     Status: None   Collection Time: 05/31/19 12:24 PM  Result Value Ref Range   Glucose-Capillary 86 70 - 99 mg/dL  Urinalysis, Routine w reflex microscopic     Status: Abnormal   Collection Time: 05/31/19  2:26 PM  Result Value Ref Range   Color, Urine AMBER (A) YELLOW    Comment: BIOCHEMICALS MAY BE AFFECTED BY COLOR   APPearance CLEAR CLEAR   Specific Gravity, Urine 1.012 1.005 - 1.030   pH 5.0 5.0 - 8.0   Glucose, UA NEGATIVE NEGATIVE mg/dL   Hgb urine dipstick NEGATIVE NEGATIVE   Bilirubin Urine NEGATIVE NEGATIVE   Ketones, ur NEGATIVE NEGATIVE mg/dL   Protein, ur NEGATIVE NEGATIVE mg/dL   Nitrite NEGATIVE NEGATIVE   Leukocytes,Ua NEGATIVE NEGATIVE    Comment: Performed at [facility],  [place] [address], [place], [place] [address]  Urine Culture     Status: None   Collection Time: 05/31/19  2:26 PM   Specimen: Urine, Catheterized  Result Value Ref Range   Specimen Description      URINE, CATHETERIZED Performed at [place] [address] [place], [place] [address]    Special Requests      NONE Performed at [facility], [place] [address] [place], [place] [address]    Culture      NO GROWTH Performed at [facility], [place] [address], [place],  [address]    Report Status 06/01/2019 FINAL   Glucose, capillary     Status: None   Collection Time: 05/31/19  4:26 PM  Result Value Ref Range   Glucose-Capillary 96 70 - 99 mg/dL   Comment 1 Notify RN    Comment 2 Document in Chart   Blood gas, arterial     Status: Abnormal   Collection Time: 05/31/19  6:33 PM  Result Value Ref Range   FIO2 21.00    pH, Arterial 7.356 7.350 - 7.450   pCO2 arterial 36.4 32.0 - 48.0 mmHg   pO2, Arterial 78.1 (L) 83.0 - 108.0 mmHg   Bicarbonate 19.9 (L) 20.0 - 28.0 mmol/L   Acid-base deficit 4.5 (H) 0.0 - 2.0 mmol/L   O2 Saturation 94.5 %   Patient temperature 37.0    Collection site LEFT BRACHIAL    Drawn by [id number]    Sample type ARTERIAL DRAW     Comment: Performed at [facility], [place] [address] [place],  [place] [address]  Glucose, capillary     Status: Abnormal   Collection Time: 05/31/19  8:18 PM  Result Value Ref Range   Glucose-Capillary 113 (H) 70 - 99 mg/dL  Glucose, capillary     Status: Abnormal   Collection Time: 06/01/19  1:17 AM  Result Value Ref Range   Glucose-Capillary 106 (H) 70 - 99 mg/dL  Hepatic function panel     Status: Abnormal   Collection Time: 06/01/19  4:06 AM  Result Value Ref Range   Total Protein 6.3 (L) 6.5 - 8.1 g/dL   Albumin 3.1 (L) 3.5 - 5.0 g/dL   AST 44 (H) 15 - 41 U/L   ALT 35 0 - 44 U/L   Alkaline Phosphatase 73 38 - 126 U/L   Total Bilirubin 4.4 (H) 0.3 - 1.2  mg/dL   Bilirubin, Direct 2.9 (H) 0.0 - 0.2 mg/dL   Indirect Bilirubin 1.5 (H) 0.3 - 0.9 mg/dL    Comment: Performed at [facility], [place] [address], [place], [place] [address]  CBC     Status: Abnormal   Collection Time: 06/01/19  4:06 AM  Result Value Ref Range   WBC 4.0 4.0 - 10.5 K/uL   RBC 4.28 3.87 - 5.11 MIL/uL   Hemoglobin 10.7 (L) 12.0 - 15.0 g/dL   HCT 34.0 (L) 36.0 - 46.0 %   MCV 79.4 (L) 80.0 - 100.0 fL   MCH 25.0 (L) 26.0 - 34.0 pg   MCHC 31.5 30.0 - 36.0 g/dL   RDW 26.2 (H) 11.5 - 15.5 %   Platelets 86 (L) 150 - 400 K/uL    Comment: Immature Platelet Fraction may be clinically indicated, consider ordering this additional test [id number] CONSISTENT WITH PREVIOUS RESULT    nRBC 1.0 (H) 0.0 - 0.2 %    Comment: Performed at [facility], [place] [address], [place], [place] [address]  Basic metabolic panel     Status: Abnormal   Collection Time: 06/01/19  4:06 AM  Result Value Ref Range   Sodium 144 135 - 145 mmol/L   Potassium 3.3 (L) 3.5 - 5.1 mmol/L   Chloride 109 98 - 111 mmol/L   CO2 20 (L) 22 - 32 mmol/L   Glucose, Bld 108 (H) 70 - 99 mg/dL   BUN 86 (H) 8 - 23 mg/dL   Creatinine, Ser 3.77 (H) 0.44 - 1.00 mg/dL   Calcium 8.5 (L) 8.9 - 10.3 mg/dL   GFR calc non Af Amer 11 (L) >60 mL/min   GFR calc Af Amer 13 (L) >60 mL/min   Anion gap 15 5 - 15    Comment: Performed at [facility], [place] [address], [place], [place] [address]  Lipase, blood     Status: Abnormal   Collection Time: 06/01/19  4:06 AM  Result Value Ref Range   Lipase 175 (H) 11 - 51 U/L    Comment: Performed at [facility] [place] [address], [place], [place] [address]  Glucose, capillary     Status: None   Collection Time: 06/01/19  5:54 AM  Result Value Ref Range   Glucose-Capillary 97 70 - 99 mg/dL  Glucose, capillary     Status: Abnormal   Collection Time: 06/01/19  7:39 AM  Result Value Ref Range   Glucose-Capillary  120 (H) 70 - 99 mg/dL  Glucose, capillary     Status: Abnormal   Collection Time: 06/01/19 12:12 PM  Result Value Ref Range  Glucose-Capillary 103 (H) 70 - 99 mg/dL    Ct Head Wo Contrast  Result Date: 05/31/2019 CLINICAL DATA:  73 year old female with acute altered level of consciousness. EXAM: CT HEAD WITHOUT CONTRAST TECHNIQUE: Contiguous axial images were obtained from the base of the skull through the vertex without intravenous contrast. COMPARISON:  11/02/2011 CT and 11/03/2011 MR FINDINGS: Brain: No evidence of acute infarction, hemorrhage, hydrocephalus, extra-axial collection or mass lesion/mass effect. Vascular: Carotid atherosclerotic calcifications noted Skull: Normal. Negative for fracture or focal lesion. Sinuses/Orbits: No acute finding. Other: None. IMPRESSION: No evidence of acute intracranial abnormality. Electronically Signed   By: Margarette Canada M.D.   On: 05/31/2019 13:58   Mr Brain Wo Contrast  Result Date: 05/31/2019 CLINICAL DATA:  73 y/o  F; altered mental status. EXAM: MRI HEAD WITHOUT CONTRAST TECHNIQUE: Multiplanar, multiecho pulse sequences of the brain and surrounding structures were obtained without intravenous contrast. COMPARISON:  05/31/2019 CT head FINDINGS: Brain: Motion degradation of several sequences. No acute infarction, hemorrhage, hydrocephalus, extra-axial collection or mass lesion. Vascular: Normal flow voids. Skull and upper cervical spine: Normal marrow signal. Sinuses/Orbits: Negative. Other: None. IMPRESSION: No acute intracranial abnormality. Unremarkable MRI of the brain for age. Electronically Signed   By: Kristine Garbe M.D.   On: 05/31/2019 22:08    Assessment/Plan  1. Acute on chronic kidney injury: Oliguric.  Given history of poor PO intake and now copious stool output, suspect intravascular depletion.  Clincially she appears dry.  CXR with pulm edema 6/17 muddies the picture.  I'm going to increase D5W to 75/ hr and follow. Repeat CXR  to assess pulm edema.  There is no absolute indication for HD at present.  I agree with holding Lasix at present.  UA negative 6/18.  2.  Afib: on dilt gtt.    3.  Acute encephalopathy: CT and MRI neg, on lactulose, have minimized narcs.   4.  Possible colitis: on cipro/ flagyl  5.  Dysphagia, n/v: EGD on hold.   Madelon Lips 06/01/2019, 3:25 PM

## 2019-06-01 NOTE — Progress Notes (Signed)
PROGRESS NOTE    Kathleen [name]  ZJI:[id number] DOB: 28-Nov-1946 DOA: 05/30/2019 PCP: Nolene Ebbs, MD    Brief Narrative: 73 year old with past medical history significant for hypertension, diastolic heart failure, chronic kidney disease a stage IV, coronary artery disease, diabetes, paroxysmal A. fib history of GI bleed who presented emergency department for evaluation of persistent nausea vomiting abdominal pain and diarrhea.  Patient has been having above symptoms for the last 2 weeks prior to admission.  She has been having trouble swallowing both solid and liquid food.  She was evaluated as an outpatient by Dr. Carlean Jews plan was to proceed with endoscopy colonoscopy.   Assessment & Plan:   Principal Problem:   Dysphagia Active Problems:   Essential hypertension   Diabetes mellitus type 2, controlled (HCC)   HLD (hyperlipidemia)   CKD (chronic kidney disease) stage 4, GFR 15-29 ml/min (HCC)   Chronic diastolic CHF (congestive heart failure) (HCC)   Atrial fibrillation, chronic   GI bleed   Acute metabolic encephalopathy;  Patient sleepy this am, subsequently wakeup a little more. She is currently protecting her airway. She has some right side upper arm weakness, unclear chronicity, per patient have for a while. Family was not aware.  -She received morphine overnight. Stop Narcotics.  -electrolytes abnormalities improved.  -CT head negative. MRI negative. ABG with only mild hypoxemia.  -Ammonia level normal. Discussed with Dr Benson Norway ammonia level doesn't necessary correlate with hepatic encephalopathy. Continue with lactulose.  patient is more alert, but no close to baseline.   A fib RVR; acute on chronic A fib  Start Cardizem Gtt.  On IV metoprolol.  Not on anticoagulation due to risk for bleeding.   Dysphagia, Nausea, vomiting;  IV fluids.  Endoscopy on hold due to AMS.   Diarrhea;  GI pathogen ordered.   Pancreatitis;  Mild.  Continue with IV fluids.  NPO.    Careful with pain medication and oversedation.   Acute on Chronic Kidney diseases stage IV; oliguric.  Cr baseline 3.1---3.8 Monitor renal function.  No significant urine out put.  On low rate IV fluids. Nephrology consulted to help with fluids in patient that is oliguric.  Defer to nephrology Lasix.    Hypernatremia; change IV fluid to D 5. Decrease rate to avoid volume overload.   Hypokalemia; replete IV.   Metabolic acidosis;  On IV fluids.  On IV antibiotics.   Mild colitis;  CT showed possible mild colitis.  Started on Ciprofloxacin and flagyl. Continue.    Hyperbilirubinemia; Liver enzymes, not significantly elevated.  CT liver without significant abnormality. S/P cholecystectomy.  Check hepatitis panel and trend bili.  GI following.   Pancytopenia;  Hb stable.  Might be related to acute illness.  Check peripheral smear.   Pressure Injury stage II POA sacrum;  Local care.     Chronic diastolic Heart failure; Appears dehydrated.  Monitor on IV fluids.   Diabetes type 2:; Holding Lantus due to poor oral intake  History of gout continue with allopurinol  Hyperlipidemia hold statins  H/O GI bleed:She has history of angiodysplastic lesions of the stomach and duodenal bulb.  Continue Protonix Pressure Injury 06/22/18 Stage II -  Partial thickness loss of dermis presenting as a shallow open ulcer with a red, pink wound bed without slough. (Active)  06/22/18 2030  Location: Sacrum  Location Orientation: Medial  Staging: Stage II -  Partial thickness loss of dermis presenting as a shallow open ulcer with a red, pink wound bed without slough.  Wound Description (Comments):   Present on Admission: Yes     Nutrition Problem: Inadequate oral intake Etiology: inability to eat Estimated body mass index is 32.85 kg/m as calculated from the following:   Height as of this encounter: 5\' 4"  (1.626 m).   Weight as of this encounter: 86.8 kg.   DVT prophylaxis:  scd.  Code Status: full code Family Communication: Sister over phone 6-19 Disposition Plan: remain in the hospital for treatment of hypernatremia, dysphagia, inability to tolerates oral.    Consultants:   GI, Dr Benson Norway   Procedures:      Antimicrobials:  none   Subjective: Patient is more alert today, following more command but still weak and speech soft.  She is complaining of abdominal pain.    Objective: Vitals:   05/31/19 1226 05/31/19 1950 05/31/19 2051 06/01/19 0507  BP: 117/68  110/77 134/74  Pulse: (!) 121  75 (!) 134  Resp: 16  18   Temp: 97.7 F (36.5 C)  (!) 97.5 F (36.4 C) 98.3 F (36.8 C)  TempSrc: Oral  Oral Axillary  SpO2: 100% 99% 98% 100%  Weight:      Height:        Intake/Output Summary (Last 24 hours) at 06/01/2019 0737 Last data filed at 06/01/2019 0600 Gross per 24 hour  Intake 2172.36 ml  Output 150 ml  Net 2022.36 ml   Filed Weights   05/30/19 1825  Weight: 86.8 kg    Examination:  General exam: NAD, less lethargic Respiratory system: No wheezing, mild crackles.  Cardiovascular system: S 1, S 2 IRR Gastrointestinal system: BS present, soft, mild tenderness Central nervous system: more alert, but still generalized weak, follow some command.   Extremities: Symmetric power.  Skin: no rashes    Data Reviewed: I have personally reviewed following labs and imaging studies  CBC: Recent Labs  Lab 05/30/19 0417 05/31/19 0409 06/01/19 0406  WBC 5.1 3.3* 4.0  NEUTROABS 3.5  --   --   HGB 10.9* 10.5* 10.7*  HCT 36.2 35.1* 34.0*  MCV 79.7* 80.3 79.4*  PLT 139* 99* 86*   Basic Metabolic Panel: Recent Labs  Lab 05/30/19 0417 05/31/19 0409 05/31/19 1207 06/01/19 0406  NA 145 147* 147* 144  K 3.7 2.8* 3.2* 3.3*  CL 103 109 109 109  CO2 22 19* 20* 20*  GLUCOSE 76 104* 94 108*  BUN 85* 83* 87* 86*  CREATININE 3.45* 3.37* 3.54* 3.77*  CALCIUM 10.0 9.2 9.2 8.5*   GFR: Estimated Creatinine Clearance: 14 mL/min (A) (by  C-G formula based on SCr of 3.77 mg/dL (H)). Liver Function Tests: Recent Labs  Lab 05/30/19 0417 05/31/19 1207 06/01/19 0406  AST 66* 50* 44*  ALT 40 38 35  ALKPHOS 91 82 73  BILITOT 5.6* 5.3* 4.4*  PROT 7.7 7.0 6.3*  ALBUMIN 4.1 3.6 3.1*   Recent Labs  Lab 05/30/19 0417 05/31/19 1222 06/01/19 0406  LIPASE 127* 579* 175*   Recent Labs  Lab 05/31/19 1207  AMMONIA 35   Coagulation Profile: No results for input(s): INR, PROTIME in the last 168 hours. Cardiac Enzymes: Recent Labs  Lab 05/30/19 0417  TROPONINI 0.06*   BNP (last 3 results) No results for input(s): PROBNP in the last 8760 hours. HbA1C: No results for input(s): HGBA1C in the last 72 hours. CBG: Recent Labs  Lab 05/31/19 1224 05/31/19 1626 05/31/19 2018 06/01/19 0117 06/01/19 0554  GLUCAP 86 96 113* 106* 97   Lipid Profile: No results for  input(s): CHOL, HDL, LDLCALC, TRIG, CHOLHDL, LDLDIRECT in the last 72 hours. Thyroid Function Tests: No results for input(s): TSH, T4TOTAL, FREET4, T3FREE, THYROIDAB in the last 72 hours. Anemia Panel: No results for input(s): VITAMINB12, FOLATE, FERRITIN, TIBC, IRON, RETICCTPCT in the last 72 hours. Sepsis Labs: No results for input(s): PROCALCITON, LATICACIDVEN in the last 168 hours.  Recent Results (from the past 240 hour(s))  SARS Coronavirus 2 (CEPHEID - Performed in [place] hospital lab), Hosp Order     Status: None   Collection Time: 05/30/19  6:17 AM   Specimen: Nasopharyngeal Swab  Result Value Ref Range Status   SARS Coronavirus 2 NEGATIVE NEGATIVE Final    Comment: (NOTE) If result is NEGATIVE SARS-CoV-2 target nucleic acids are NOT DETECTED. The SARS-CoV-2 RNA is generally detectable in upper and lower  respiratory specimens during the acute phase of infection. The lowest  concentration of SARS-CoV-2 viral copies this assay can detect is 250  copies / mL. A negative result does not preclude SARS-CoV-2 infection  and should not be used as  the sole basis for treatment or other  patient management decisions.  A negative result may occur with  improper specimen collection / handling, submission of specimen other  than nasopharyngeal swab, presence of viral mutation(s) within the  areas targeted by this assay, and inadequate number of viral copies  (<250 copies / mL). A negative result must be combined with clinical  observations, patient history, and epidemiological information. If result is POSITIVE SARS-CoV-2 target nucleic acids are DETECTED. The SARS-CoV-2 RNA is generally detectable in upper and lower  respiratory specimens dur ing the acute phase of infection.  Positive  results are indicative of active infection with SARS-CoV-2.  Clinical  correlation with patient history and other diagnostic information is  necessary to determine patient infection status.  Positive results do  not rule out bacterial infection or co-infection with other viruses. If result is PRESUMPTIVE POSTIVE SARS-CoV-2 nucleic acids MAY BE PRESENT.   A presumptive positive result was obtained on the submitted specimen  and confirmed on repeat testing.  While 2019 novel coronavirus  (SARS-CoV-2) nucleic acids may be present in the submitted sample  additional confirmatory testing may be necessary for epidemiological  and / or clinical management purposes  to differentiate between  SARS-CoV-2 and other Sarbecovirus currently known to infect humans.  If clinically indicated additional testing with an alternate test  methodology [phone]) is advised. The SARS-CoV-2 RNA is generally  detectable in upper and lower respiratory sp ecimens during the acute  phase of infection. The expected result is Negative. Fact Sheet for Patients:  StrictlyIdeas.no Fact Sheet for Healthcare Providers: BankingDealers.co.za This test is not yet approved or cleared by the [place] FDA and has been authorized for  detection and/or diagnosis of SARS-CoV-2 by FDA under an Emergency Use Authorization (EUA).  This EUA will remain in effect (meaning this test can be used) for the duration of the COVID-19 declaration under Section 564(b)(1) of the Act, 21 U.S.C. section 360bbb-3(b)(1), unless the authorization is terminated or revoked sooner. Performed at [facility], [place] [address] [place], [place] [address]          Radiology Studies: Ct Abdomen Pelvis Wo Contrast  Result Date: 05/30/2019 CLINICAL DATA:  Persistent nausea, vomiting, abdominal pain and diarrhea. Symptoms 2 weeks. History of GI bleed. Hemodynamically stable. EXAM: CT ABDOMEN AND PELVIS WITHOUT CONTRAST TECHNIQUE: Multidetector CT imaging of the abdomen and pelvis was performed following the standard protocol  without IV contrast. COMPARISON:  09/11/2018, 08/15/2015 FINDINGS: Lower chest: Subtle patchy density in the lung bases which may be due to atelectasis versus early infection. Atelectasis over the lingula. Stable cardiomegaly. Calcified plaque over the coronary arteries. Mild calcified plaque over the thoracic aorta. Hepatobiliary: Previous cholecystectomy. Stable linear calcification over the left lobe of the liver. Biliary tree is normal. Pancreas: Mild stranding of the fat adjacent the tail of pancreas. Spleen: Normal. Adrenals/Urinary Tract: Stable prominence of the adrenal glands. Kidneys at the lower limits of normal in size with a few stable right renal cysts. No hydronephrosis. Ureters and bladder are unremarkable. Stomach/Bowel: Stomach and small bowel are normal. Appendix is normal. Diverticulosis of the distal descending and sigmoid colon. Very subtle stranding of the fat adjacent the distal descending colon in the left lower quadrant which may be within normal although could be due to mild acute diverticulitis. Short segment of transverse colon extends into a moderate size midline umbilical hernia.  Vascular/Lymphatic: Moderate calcified plaque over the abdominal aorta. No adenopathy. Reproductive: Normal. Other: Moderate size midline umbilical hernia containing peritoneal fat and short segment of transverse colon. No evidence of bowel obstruction. Small amount of free fluid in the pelvis. Partially visualized catheter on the most inferior image over the anterior pelvis/perineum. Musculoskeletal: Degenerative change of the spine and hips. Stable grade 1 anterolisthesis of L4 on L5. IMPRESSION: 1. Stranding of the peripancreatic fat adjacent the tail the pancreas which could be seen due to mild acute pancreatitis. Recommend clinical correlation. 2. Diverticulosis of the descending colon with subtle adjacent stranding of the pericolonic fat as findings may be within normal although could be seen due to mild acute diverticulitis. 3. Moderate size umbilical hernia containing peritoneal fat and short segment of transverse colon. No evidence of bowel obstruction. 4. Subtle patchy opacification over the lung bases which may be due to atelectasis or early infection. Lingular atelectasis. 5.  Cardiomegaly.  Atherosclerotic coronary artery disease. 6.  Aortic Atherosclerosis (ICD10-I70.0). 7.  Slightly small kidneys.  Several stable right renal cysts. Electronically Signed   By: Marin Olp M.D.   On: 05/30/2019 11:07   Ct Head Wo Contrast  Result Date: 05/31/2019 CLINICAL DATA:  73 year old female with acute altered level of consciousness. EXAM: CT HEAD WITHOUT CONTRAST TECHNIQUE: Contiguous axial images were obtained from the base of the skull through the vertex without intravenous contrast. COMPARISON:  11/02/2011 CT and 11/03/2011 MR FINDINGS: Brain: No evidence of acute infarction, hemorrhage, hydrocephalus, extra-axial collection or mass lesion/mass effect. Vascular: Carotid atherosclerotic calcifications noted Skull: Normal. Negative for fracture or focal lesion. Sinuses/Orbits: No acute finding. Other:  None. IMPRESSION: No evidence of acute intracranial abnormality. Electronically Signed   By: Margarette Canada M.D.   On: 05/31/2019 13:58   Mr Brain Wo Contrast  Result Date: 05/31/2019 CLINICAL DATA:  73 y/o  F; altered mental status. EXAM: MRI HEAD WITHOUT CONTRAST TECHNIQUE: Multiplanar, multiecho pulse sequences of the brain and surrounding structures were obtained without intravenous contrast. COMPARISON:  05/31/2019 CT head FINDINGS: Brain: Motion degradation of several sequences. No acute infarction, hemorrhage, hydrocephalus, extra-axial collection or mass lesion. Vascular: Normal flow voids. Skull and upper cervical spine: Normal marrow signal. Sinuses/Orbits: Negative. Other: None. IMPRESSION: No acute intracranial abnormality. Unremarkable MRI of the brain for age. Electronically Signed   By: Kristine Garbe M.D.   On: 05/31/2019 22:08        Scheduled Meds:  allopurinol  100 mg Oral Daily   insulin aspart  0-9 Units Subcutaneous  Q4H   lactulose  30 g Oral TID   lactulose  300 mL Rectal Daily   metoprolol tartrate  5 mg Intravenous Q6H   mometasone-formoterol  2 puff Inhalation BID   pantoprazole (PROTONIX) IV  40 mg Intravenous Q12H   sodium chloride flush  3 mL Intravenous Q12H   thiamine injection  100 mg Intravenous Daily   Continuous Infusions:  sodium chloride     ciprofloxacin Stopped (05/31/19 2259)   dextrose 75 mL/hr at 06/01/19 0600   diltiazem (CARDIZEM) infusion     famotidine (PEPCID) IV Stopped (05/31/19 1748)   metronidazole Stopped (06/01/19 0021)     LOS: 1 day    Time spent: 35 minutes.     Elmarie Shiley, MD [facility] Pager [phone]  If 7PM-7AM, please contact night-coverage [email or web address] Password [email or web address] 06/01/2019, 7:36 AM

## 2019-06-02 DIAGNOSIS — K8500 Idiopathic acute pancreatitis without necrosis or infection: Principal | ICD-10-CM

## 2019-06-02 DIAGNOSIS — K5733 Diverticulitis of large intestine without perforation or abscess with bleeding: Secondary | ICD-10-CM

## 2019-06-02 LAB — COMPREHENSIVE METABOLIC PANEL
ALT: 34 U/L (ref 0–44)
AST: 38 U/L (ref 15–41)
Albumin: 2.7 g/dL — ABNORMAL LOW (ref 3.5–5.0)
Alkaline Phosphatase: 63 U/L (ref 38–126)
Anion gap: 14 (ref 5–15)
BUN: 90 mg/dL — ABNORMAL HIGH (ref 8–23)
CO2: 20 mmol/L — ABNORMAL LOW (ref 22–32)
Calcium: 8.1 mg/dL — ABNORMAL LOW (ref 8.9–10.3)
Chloride: 105 mmol/L (ref 98–111)
Creatinine, Ser: 3.69 mg/dL — ABNORMAL HIGH (ref 0.44–1.00)
GFR calc Af Amer: 13 mL/min — ABNORMAL LOW (ref >60)
GFR calc non Af Amer: 11 mL/min — ABNORMAL LOW (ref >60)
Glucose, Bld: 210 mg/dL — ABNORMAL HIGH (ref 70–99)
Potassium: 3.1 mmol/L — ABNORMAL LOW (ref 3.5–5.1)
Sodium: 139 mmol/L (ref 135–145)
Total Bilirubin: 4.6 mg/dL — ABNORMAL HIGH (ref 0.3–1.2)
Total Protein: 5.8 g/dL — ABNORMAL LOW (ref 6.5–8.1)

## 2019-06-02 LAB — CBC
HCT: 31.6 % — ABNORMAL LOW (ref 36.0–46.0)
Hemoglobin: 9.8 g/dL — ABNORMAL LOW (ref 12.0–15.0)
MCH: 24.6 pg — ABNORMAL LOW (ref 26.0–34.0)
MCHC: 31 g/dL (ref 30.0–36.0)
MCV: 79.2 fL — ABNORMAL LOW (ref 80.0–100.0)
Platelets: 70 10*3/uL — ABNORMAL LOW (ref 150–400)
RBC: 3.99 MIL/uL (ref 3.87–5.11)
RDW: 26.1 % — ABNORMAL HIGH (ref 11.5–15.5)
WBC: 4.4 10*3/uL (ref 4.0–10.5)
nRBC: 0.7 % — ABNORMAL HIGH (ref 0.0–0.2)

## 2019-06-02 LAB — GASTROINTESTINAL PANEL BY PCR, STOOL (REPLACES STOOL CULTURE)

## 2019-06-02 LAB — BILIRUBIN, FRACTIONATED(TOT/DIR/INDIR)
Bilirubin, Direct: 3.1 mg/dL — ABNORMAL HIGH (ref 0.0–0.2)
Indirect Bilirubin: 1.7 mg/dL — ABNORMAL HIGH (ref 0.3–0.9)
Total Bilirubin: 4.8 mg/dL — ABNORMAL HIGH (ref 0.3–1.2)

## 2019-06-02 LAB — GLUCOSE, CAPILLARY
Glucose-Capillary: 110 mg/dL — ABNORMAL HIGH (ref 70–99)
Glucose-Capillary: 112 mg/dL — ABNORMAL HIGH (ref 70–99)
Glucose-Capillary: 122 mg/dL — ABNORMAL HIGH (ref 70–99)
Glucose-Capillary: 133 mg/dL — ABNORMAL HIGH (ref 70–99)
Glucose-Capillary: 138 mg/dL — ABNORMAL HIGH (ref 70–99)

## 2019-06-02 LAB — MAGNESIUM: Magnesium: 1.9 mg/dL (ref 1.7–2.4)

## 2019-06-02 MED ORDER — POTASSIUM CHLORIDE 10 MEQ/50ML IV SOLN
10.0000 meq | INTRAVENOUS | Status: DC
  Filled 2019-06-02 (×3): qty 50

## 2019-06-02 MED ORDER — SODIUM CHLORIDE 0.9 % IV SOLN
INTRAVENOUS | Status: AC
  Administered 2019-06-02: 14:00:00 via INTRAVENOUS

## 2019-06-02 MED ORDER — POTASSIUM CHLORIDE 10 MEQ/100ML IV SOLN
10.0000 meq | INTRAVENOUS | Status: AC
  Administered 2019-06-02 (×3): 10 meq via INTRAVENOUS
  Filled 2019-06-02 (×3): qty 100

## 2019-06-02 MED ORDER — MAGNESIUM SULFATE 2 GM/50ML IV SOLN
2.0000 g | Freq: Once | INTRAVENOUS | Status: AC
  Administered 2019-06-02: 2 g via INTRAVENOUS
  Filled 2019-06-02: qty 50

## 2019-06-02 NOTE — Progress Notes (Addendum)
Gastroenterology Progress Note covering for Drs. Mann & Benson Norway   CC:  Vomiting, dysphagia and diarrhea   Subjective: She states feeling much better today. Speech is clear. She is answering questions appropriately. Her sister is at the bed side she confirms patient is more alert and no longer confused. She is tolerating a clear liquid diet. No vomiting. Less diarrhea.  Objective:  Vital signs in last 24 hours: Temp:  [97.4 F (36.3 C)-97.5 F (36.4 C)] 97.5 F (36.4 C) (06/20 1331) Pulse Rate:  [59-95] 61 (06/20 1331) Resp:  [16-20] 16 (06/20 1331) BP: (92-123)/(45-88) 94/50 (06/20 1331) SpO2:  [99 %-100 %] 100 % (06/20 1331) Last BM Date: 06/01/19 General:   Alert, fatigued appearing in NAD. Heart: RRR, systolic murmur. Pulm: Lungs clear throughout. Abdomen: Soft, nontender, nondistended, umbilical hernia, + BS x 4 quads. Extremities:  Without edema. Neurologic:  Alert and  oriented x3;  Moves all extremities. Psych:  Alert and cooperative. Normal mood and affect.  Intake/Output from previous day: 06/19 0701 - 06/20 0700 In: 1402 [I.V.:802; IV Piggyback:600] Out: 1450 [Urine:500; Stool:950] Intake/Output this shift: No intake/output data recorded.   Flexi Seal with 200cc golden liquid.  Lab Results: Recent Labs    05/31/19 0409 06/01/19 0406 06/02/19 0737  WBC 3.3* 4.0 4.4  HGB 10.5* 10.7* 9.8*  HCT 35.1* 34.0* 31.6*  PLT 99* 86* 70*   BMET Recent Labs    05/31/19 1207 06/01/19 0406 06/02/19 0737  NA 147* 144 139  K 3.2* 3.3* 3.1*  CL 109 109 105  CO2 20* 20* 20*  GLUCOSE 94 108* 210*  BUN 87* 86* 90*  CREATININE 3.54* 3.77* 3.69*  CALCIUM 9.2 8.5* 8.1*   LFT Recent Labs    06/01/19 0406 06/02/19 0737  PROT 6.3* 5.8*  ALBUMIN 3.1* 2.7*  AST 44* 38  ALT 35 34  ALKPHOS 73 63  BILITOT 4.4* 4.6*  BILIDIR 2.9*  --   IBILI 1.5*  --    PT/INR No results for input(s): LABPROT, INR in the last 72 hours. Hepatitis Panel Recent Labs     05/31/19 1207  HEPBSAG Negative  HCVAB 0.2  HEPAIGM Negative  HEPBIGM Negative    Dg Chest 1 View  Result Date: 06/01/2019 CLINICAL DATA:  Hypertension EXAM: CHEST  1 VIEW COMPARISON:  05/30/2019, 12/19/2018 FINDINGS: Cardiomegaly. Enlarged central pulmonary arteries consistent with pulmonary hypertension. Aortic atherosclerosis. Patchy airspace disease at the left base. No pneumothorax. IMPRESSION: 1. Cardiomegaly.  Patchy atelectasis or infiltrate at the left base. 2. Enlarged appearing central pulmonary vessels suggesting arterial hypertension Electronically Signed   By: Donavan Foil M.D.   On: 06/01/2019 20:31   Mr Brain Wo Contrast  Result Date: 05/31/2019 CLINICAL DATA:  73 y/o  F; altered mental status. EXAM: MRI HEAD WITHOUT CONTRAST TECHNIQUE: Multiplanar, multiecho pulse sequences of the brain and surrounding structures were obtained without intravenous contrast. COMPARISON:  05/31/2019 CT head FINDINGS: Brain: Motion degradation of several sequences. No acute infarction, hemorrhage, hydrocephalus, extra-axial collection or mass lesion. Vascular: Normal flow voids. Skull and upper cervical spine: Normal marrow signal. Sinuses/Orbits: Negative. Other: None. IMPRESSION: No acute intracranial abnormality. Unremarkable MRI of the brain for age. Electronically Signed   By: Kristine Garbe M.D.   On: 05/31/2019 22:08    Assessment / Plan: 1. Mild Pancreatitis per abd/pelvic CT. Lipase 175 << 579. No further vomiting. No N/V. T. Bili 4.6. AST 38. ALT 34. -continue clear liquids for now -Zofran PRN   2. Diarrhea, possible diverticulitis  distal descending colon per CTAP on Cipro and Flagyl IV. C. Diff and GI panel pending. Flexi Seal with 200cc golden liquid stool past 24 hrs.  3. Acute encephalopathy of unclear etiology improving, patient is conversing, alert and oriented today. Not sure if she needs to continue Lactulose with improved mental status with ongoing diarrhea. Ammonia  35 6/18. Further recommendations per Dr. Fuller Plan.   4. Dysphagia/vomiting. Will need eventual EGD, per Dr. Benson Norway  6. Anemia Hg 9.8. HCT 10.7. MCV 79.2. -eventual EGD/colonoscopy with Dr. Benson Norway  5. Atrial fibrillation stable  6. CKD. Cr 3.69 down from 3.77.   Further recommendation per Dr. Fuller Plan      LOS: 2 days   Noralyn Pick NP 06/02/2019, 1:51 PM      Attending Physician Note   I have taken an interval history, reviewed the chart and examined the patient. I agree with the Advanced Practitioner's note, impression and recommendations.   Mild pancreatitis, resolving. Advance to clear liquids  Hyperbilirubinemia, fractionate. Possible Gilbert's syndrome.  Diarrhea, etiology unclear. GI pathogen panel negative. Check C. diff  Mild diverticulitis, continue IV antibiotics  Mental status improved and no clear evidence of HE so lactulose was stopped Umbilical hernia containing short segment of transvesre colon  Gastroduodenal and jejunal AVMs Dr. Benson Norway to resume GI care on Monday  Lucio Edward, MD [facility] [phone]

## 2019-06-02 NOTE — Progress Notes (Signed)
Bp and pulse remains low. Pt. Asymptomatic. MD notified. Orders received.

## 2019-06-02 NOTE — Plan of Care (Signed)
  Problem: Education: Goal: Knowledge of General Education information will improve Description: Including pain rating scale, medication(s)/side effects and non-pharmacologic comfort measures Outcome: Progressing   Problem: Clinical Measurements: Goal: Ability to maintain clinical measurements within normal limits will improve Outcome: Progressing Goal: Will remain free from infection Outcome: Progressing Goal: Diagnostic test results will improve Outcome: Progressing Goal: Respiratory complications will improve Outcome: Progressing Goal: Cardiovascular complication will be avoided Outcome: Progressing   Problem: Elimination: Goal: Will not experience complications related to urinary retention Outcome: Progressing   Problem: Pain Managment: Goal: General experience of comfort will improve Outcome: Progressing   Problem: Safety: Goal: Ability to remain free from injury will improve Outcome: Progressing   Problem: Skin Integrity: Goal: Risk for impaired skin integrity will decrease Outcome: Progressing

## 2019-06-02 NOTE — Progress Notes (Signed)
Verbal order received from Dr. Tyrell Antonio to titrate IV cardizem to 7.5mg /hr. for decreased HR.

## 2019-06-02 NOTE — Progress Notes (Signed)
[facility] Progress Note    Assessment/ Plan:   1. Acute on chronic kidney injury: Oliguric.  Given history of poor PO intake and now copious stool output, suspect intravascular depletion.  Clincially she appears dry.  CXR with pulm edema 6/17 muddies the picture.  Repeat CXR 06-29-2023 notes some prominent pulmonary vasculature suggesting pHTN  and LLL infiltrate vs atalectasis.  Urine output improved after increasing D5W to 75/ hr.  Will continue gentle fluids for 1 more day and if stool output decreases, can likely trial off fluids.  TTE in 2019 noted mildly dilated RV so we'll have to follow closely.  2,  Hypernatremia: Resolved, will switch fluids to NS.    3  Afib: on dilt gtt.    4.  Acute encephalopathy: CT and MRI neg, on lactulose, have minimized narcs.   5  Possible colitis: on cipro/ flagyl  6.  Dysphagia, n/v: EGD on hold.   Subjective:    Per sister, more awake and alert today.  Pt says she's feeling better.  950 mL stool charted yesterday with 500 mL urine output.   Objective:   BP (!) 94/50 (BP Location: Left Arm)   Pulse 61   Temp (!) 97.5 F (36.4 C) (Oral)   Resp 16   Ht 5\' 4"  (1.626 m)   Wt 86.8 kg   SpO2 100%   BMI 32.85 kg/m   Intake/Output Summary (Last 24 hours) at 06/02/2019 1341 Last data filed at 06/02/2019 6606 Gross per 24 hour  Intake 1401.96 ml  Output 1450 ml  Net -48.04 ml   Weight change:   Physical Exam: GEN: older woman, lying in bed, NAD.  More alert and talkative than yesterday. HEENT EOMI PERRL, very dry MM but slightly improved. NECK slight JVD at 30 degrees PULM normal WOB, clear anteriorly, poor air movement so can't really assess posteriorly very well CV RRR no m/r/g ABD hypoactive bowel sounds, tenderness improved EXT no LE edema with wrinkling of the skin NEURO alert and talkative today SKIN improving skin turgor MSK no effusions  Imaging: Dg Chest 1 View  Result Date: 06/29/19 CLINICAL DATA:   Hypertension EXAM: CHEST  1 VIEW COMPARISON:  05/30/2019, 12/19/2018 FINDINGS: Cardiomegaly. Enlarged central pulmonary arteries consistent with pulmonary hypertension. Aortic atherosclerosis. Patchy airspace disease at the left base. No pneumothorax. IMPRESSION: 1. Cardiomegaly.  Patchy atelectasis or infiltrate at the left base. 2. Enlarged appearing central pulmonary vessels suggesting arterial hypertension Electronically Signed   By: Donavan Foil M.D.   On: 06/29/2019 20:31   Mr Brain Wo Contrast  Result Date: 05/31/2019 CLINICAL DATA:  73 y/o  F; altered mental status. EXAM: MRI HEAD WITHOUT CONTRAST TECHNIQUE: Multiplanar, multiecho pulse sequences of the brain and surrounding structures were obtained without intravenous contrast. COMPARISON:  05/31/2019 CT head FINDINGS: Brain: Motion degradation of several sequences. No acute infarction, hemorrhage, hydrocephalus, extra-axial collection or mass lesion. Vascular: Normal flow voids. Skull and upper cervical spine: Normal marrow signal. Sinuses/Orbits: Negative. Other: None. IMPRESSION: No acute intracranial abnormality. Unremarkable MRI of the brain for age. Electronically Signed   By: Kristine Garbe M.D.   On: 05/31/2019 22:08    Labs: BMET Recent Labs  Lab 05/30/19 0417 05/31/19 0409 05/31/19 1207 2019/06/29 0406 06/02/19 0737  NA 145 147* 147* 144 139  K 3.7 2.8* 3.2* 3.3* 3.1*  CL 103 109 109 109 105  CO2 22 19* 20* 20* 20*  GLUCOSE 76 104* 94 108* 210*  BUN 85* 83* 87* 86*  90*  CREATININE 3.45* 3.37* 3.54* 3.77* 3.69*  CALCIUM 10.0 9.2 9.2 8.5* 8.1*   CBC Recent Labs  Lab 05/30/19 0417 05/31/19 0409 06/01/19 0406 06/02/19 0737  WBC 5.1 3.3* 4.0 4.4  NEUTROABS 3.5  --   --   --   HGB 10.9* 10.5* 10.7* 9.8*  HCT 36.2 35.1* 34.0* 31.6*  MCV 79.7* 80.3 79.4* 79.2*  PLT 139* 99* 86* 70*    Medications:    . allopurinol  100 mg Oral Daily  . insulin aspart  0-9 Units Subcutaneous Q4H  . lactulose  30 g Oral  TID  . lactulose  300 mL Rectal Daily  . metoprolol tartrate  5 mg Intravenous Q6H  . mometasone-formoterol  2 puff Inhalation BID  . pantoprazole (PROTONIX) IV  40 mg Intravenous Q12H  . sodium chloride flush  3 mL Intravenous Q12H  . thiamine injection  100 mg Intravenous Daily      Madelon Lips MD [facility] pgr [phone] 06/02/2019, 1:41 PM

## 2019-06-02 NOTE — Progress Notes (Signed)
PROGRESS NOTE    Kathleen [name]  HQR:[id number] DOB: [birth year] DOA: 05/30/2019 PCP: Nolene Ebbs, MD    Brief Narrative: 73 year old with past medical history significant for hypertension, diastolic heart failure, chronic kidney disease a stage IV, coronary artery disease, diabetes, paroxysmal A. fib history of GI bleed who presented emergency department for evaluation of persistent nausea vomiting abdominal pain and diarrhea.  Patient has been having above symptoms for the last 2 weeks prior to admission.  She has been having trouble swallowing both solid and liquid food.  She was evaluated as an outpatient by Dr. Carlean Jews plan was to proceed with endoscopy colonoscopy.   Assessment & Plan:   Principal Problem:   Dysphagia Active Problems:   Essential hypertension   Diabetes mellitus type 2, controlled (HCC)   HLD (hyperlipidemia)   CKD (chronic kidney disease) stage 4, GFR 15-29 ml/min (HCC)   Chronic diastolic CHF (congestive heart failure) (HCC)   Atrial fibrillation, chronic   GI bleed   Acute metabolic encephalopathy;  Patient sleepy this am, subsequently wakeup a little more. She is currently protecting her airway. She has some right side upper arm weakness, unclear chronicity, per patient have for a while. Family was not aware.  -She received morphine overnight. Stop Narcotics.  -electrolytes abnormalities improved.  -CT head negative. MRI negative. ABG with only mild hypoxemia.  -Ammonia level normal. Discussed with Dr Benson Norway ammonia level doesn't necessary correlate with hepatic encephalopathy. Continue with lactulose.  Patient is more alert, but no close to baseline. She is following command today  A fib RVR; acute on chronic A fib  Start Cardizem Gtt.  On IV metoprolol.  Not on anticoagulation due to risk for bleeding.   Dysphagia, Nausea, vomiting;  IV fluids.  Endoscopy on hold due to AMS.   Diarrhea;  GI pathogen ordered.   Pancreatitis;  Continue with IV  fluids.  NPO.  Careful with pain medication and oversedation.  Report improvement of abdominal pain.   Acute on Chronic Kidney diseases stage IV; oliguric.  Cr baseline 3.1---3.8 Monitor renal function.  On low rate IV fluids. Nephrology consulted to help with fluids in patient that is oliguric.  Defer to nephrology Lasix.  Urine out put improved yesterday 500 cc.  On IV fluids.   Hypernatremia; change IV fluid to D 5. Decrease rate to avoid volume overload.   Hypokalemia; replete IV.   Metabolic acidosis;  On IV fluids.  On IV antibiotics.   Mild colitis;  CT showed possible mild colitis.  Continue with Ciprofloxacin and flagyl. Continue.    Hyperbilirubinemia; Liver enzymes, not significantly elevated.  CT liver without significant abnormality. S/P cholecystectomy.  hepatitis panel negative and trend bili.  GI following.   Pancytopenia;  Hb stable.  Might be related to acute illness.  Peripheral smear no significant schistocyte.  thrombocytopenia suspect related to infection. Monitor.   Pressure Injury stage II POA sacrum;  Local care.   Chronic diastolic Heart failure; Appears dehydrated.  Monitor on IV fluids.   Diabetes type 2:; Holding Lantus due to poor oral intake  History of gout continue with allopurinol  Hyperlipidemia hold statins  H/O GI bleed:She has history of angiodysplastic lesions of the stomach and duodenal bulb.  Continue Protonix   Pressure Injury 06/22/18 Stage II -  Partial thickness loss of dermis presenting as a shallow open ulcer with a red, pink wound bed without slough. (Active)  06/22/18 2030  Location: Sacrum  Location Orientation: Medial  Staging: Stage II -  Partial thickness loss of dermis presenting as a shallow open ulcer with a red, pink wound bed without slough.  Wound Description (Comments):   Present on Admission: Yes     Nutrition Problem: Inadequate oral intake Etiology: inability to eat Estimated body mass  index is 32.85 kg/m as calculated from the following:   Height as of this encounter: 5\' 4"  (1.626 m).   Weight as of this encounter: 86.8 kg.   DVT prophylaxis: scd.  Code Status: full code Family Communication: Sister over phone 6-20 Disposition Plan: remain in the hospital for treatment of hypernatremia, dysphagia, inability to tolerates oral.    Consultants:   GI, Dr Benson Norway   Procedures:      Antimicrobials:  none   Subjective: She is more alert, report abdominal pain have improved.    Objective: Vitals:   06/02/19 0623 06/02/19 0809 06/02/19 1303 06/02/19 1331  BP: (!) 115/55  (!) 92/45 (!) 94/50  Pulse: 69  (!) 59 61  Resp: 20   16  Temp: (!) 97.4 F (36.3 C)   (!) 97.5 F (36.4 C)  TempSrc: Oral   Oral  SpO2: 100% 100%  100%  Weight:      Height:        Intake/Output Summary (Last 24 hours) at 06/02/2019 1337 Last data filed at 06/02/2019 1155 Gross per 24 hour  Intake 1401.96 ml  Output 1450 ml  Net -48.04 ml   Filed Weights   05/30/19 1825  Weight: 86.8 kg    Examination:  General exam: Alert. Sick appearing.  Respiratory system: no wheezing.  Cardiovascular system; S 1, S 2 IRR Gastrointestinal system: BS present, soft, mild tender, distended Central nervous system: alert, follows command Extremities: symmetric power.  Skin: no rashes    Data Reviewed: I have personally reviewed following labs and imaging studies  CBC: Recent Labs  Lab 05/30/19 0417 05/31/19 0409 06/01/19 0406 06/02/19 0737  WBC 5.1 3.3* 4.0 4.4  NEUTROABS 3.5  --   --   --   HGB 10.9* 10.5* 10.7* 9.8*  HCT 36.2 35.1* 34.0* 31.6*  MCV 79.7* 80.3 79.4* 79.2*  PLT 139* 99* 86* 70*   Basic Metabolic Panel: Recent Labs  Lab 05/30/19 0417 05/31/19 0409 05/31/19 1207 06/01/19 0406 06/02/19 0737  NA 145 147* 147* 144 139  K 3.7 2.8* 3.2* 3.3* 3.1*  CL 103 109 109 109 105  CO2 22 19* 20* 20* 20*  GLUCOSE 76 104* 94 108* 210*  BUN 85* 83* 87* 86* 90*   CREATININE 3.45* 3.37* 3.54* 3.77* 3.69*  CALCIUM 10.0 9.2 9.2 8.5* 8.1*  MG  --   --   --   --  1.9   GFR: Estimated Creatinine Clearance: 14.3 mL/min (A) (by C-G formula based on SCr of 3.69 mg/dL (H)). Liver Function Tests: Recent Labs  Lab 05/30/19 0417 05/31/19 1207 06/01/19 0406 06/02/19 0737  AST 66* 50* 44* 38  ALT 40 38 35 34  ALKPHOS 91 82 73 63  BILITOT 5.6* 5.3* 4.4* 4.6*  PROT 7.7 7.0 6.3* 5.8*  ALBUMIN 4.1 3.6 3.1* 2.7*   Recent Labs  Lab 05/30/19 0417 05/31/19 1222 06/01/19 0406  LIPASE 127* 579* 175*   Recent Labs  Lab 05/31/19 1207  AMMONIA 35   Coagulation Profile: No results for input(s): INR, PROTIME in the last 168 hours. Cardiac Enzymes: Recent Labs  Lab 05/30/19 0417  TROPONINI 0.06*   BNP (last 3 results) No results for input(s): PROBNP in the  last 8760 hours. HbA1C: No results for input(s): HGBA1C in the last 72 hours. CBG: Recent Labs  Lab 06/01/19 0739 06/01/19 1212 06/01/19 1616 06/02/19 0102 06/02/19 0621  GLUCAP 120* 103* 110* 112* 138*   Lipid Profile: No results for input(s): CHOL, HDL, LDLCALC, TRIG, CHOLHDL, LDLDIRECT in the last 72 hours. Thyroid Function Tests: No results for input(s): TSH, T4TOTAL, FREET4, T3FREE, THYROIDAB in the last 72 hours. Anemia Panel: No results for input(s): VITAMINB12, FOLATE, FERRITIN, TIBC, IRON, RETICCTPCT in the last 72 hours. Sepsis Labs: No results for input(s): PROCALCITON, LATICACIDVEN in the last 168 hours.  Recent Results (from the past 240 hour(s))  SARS Coronavirus 2 (CEPHEID - Performed in [place] hospital lab), Hosp Order     Status: None   Collection Time: 05/30/19  6:17 AM   Specimen: Nasopharyngeal Swab  Result Value Ref Range Status   SARS Coronavirus 2 NEGATIVE NEGATIVE Final    Comment: (NOTE) If result is NEGATIVE SARS-CoV-2 target nucleic acids are NOT DETECTED. The SARS-CoV-2 RNA is generally detectable in upper and lower  respiratory specimens during  the acute phase of infection. The lowest  concentration of SARS-CoV-2 viral copies this assay can detect is 250  copies / mL. A negative result does not preclude SARS-CoV-2 infection  and should not be used as the sole basis for treatment or other  patient management decisions.  A negative result may occur with  improper specimen collection / handling, submission of specimen other  than nasopharyngeal swab, presence of viral mutation(s) within the  areas targeted by this assay, and inadequate number of viral copies  (<250 copies / mL). A negative result must be combined with clinical  observations, patient history, and epidemiological information. If result is POSITIVE SARS-CoV-2 target nucleic acids are DETECTED. The SARS-CoV-2 RNA is generally detectable in upper and lower  respiratory specimens dur ing the acute phase of infection.  Positive  results are indicative of active infection with SARS-CoV-2.  Clinical  correlation with patient history and other diagnostic information is  necessary to determine patient infection status.  Positive results do  not rule out bacterial infection or co-infection with other viruses. If result is PRESUMPTIVE POSTIVE SARS-CoV-2 nucleic acids MAY BE PRESENT.   A presumptive positive result was obtained on the submitted specimen  and confirmed on repeat testing.  While 2019 novel coronavirus  (SARS-CoV-2) nucleic acids may be present in the submitted sample  additional confirmatory testing may be necessary for epidemiological  and / or clinical management purposes  to differentiate between  SARS-CoV-2 and other Sarbecovirus currently known to infect humans.  If clinically indicated additional testing with an alternate test  methodology [phone]) is advised. The SARS-CoV-2 RNA is generally  detectable in upper and lower respiratory sp ecimens during the acute  phase of infection. The expected result is Negative. Fact Sheet for Patients:   StrictlyIdeas.no Fact Sheet for Healthcare Providers: BankingDealers.co.za This test is not yet approved or cleared by the [place] FDA and has been authorized for detection and/or diagnosis of SARS-CoV-2 by FDA under an Emergency Use Authorization (EUA).  This EUA will remain in effect (meaning this test can be used) for the duration of the COVID-19 declaration under Section 564(b)(1) of the Act, 21 U.S.C. section 360bbb-3(b)(1), unless the authorization is terminated or revoked sooner. Performed at [facility], [place] [address], [place], [place] [address]   Urine Culture     Status: None   Collection Time: 05/31/19  2:26 PM  Specimen: Urine, Catheterized  Result Value Ref Range Status   Specimen Description   Final    URINE, CATHETERIZED Performed at [place] [address], [place], [place] [address]    Special Requests   Final    NONE Performed at [facility], [place] [address] [place], [place] [address]    Culture   Final    NO GROWTH Performed at [facility], [place] [address], [place], [place] [address]    Report Status 06/01/2019 FINAL  Final         Radiology Studies: Dg Chest 1 View  Result Date: 06/01/2019 CLINICAL DATA:  Hypertension EXAM: CHEST  1 VIEW COMPARISON:  05/30/2019, 12/19/2018 FINDINGS: Cardiomegaly. Enlarged central pulmonary arteries consistent with pulmonary hypertension. Aortic atherosclerosis. Patchy airspace disease at the left base. No pneumothorax. IMPRESSION: 1. Cardiomegaly.  Patchy atelectasis or infiltrate at the left base. 2. Enlarged appearing central pulmonary vessels suggesting arterial hypertension Electronically Signed   By: Donavan Foil M.D.   On: 06/01/2019 20:31   Mr Brain Wo Contrast  Result Date: 05/31/2019 CLINICAL DATA:  73 y/o  F; altered mental status. EXAM: MRI HEAD WITHOUT CONTRAST TECHNIQUE: Multiplanar,  multiecho pulse sequences of the brain and surrounding structures were obtained without intravenous contrast. COMPARISON:  05/31/2019 CT head FINDINGS: Brain: Motion degradation of several sequences. No acute infarction, hemorrhage, hydrocephalus, extra-axial collection or mass lesion. Vascular: Normal flow voids. Skull and upper cervical spine: Normal marrow signal. Sinuses/Orbits: Negative. Other: None. IMPRESSION: No acute intracranial abnormality. Unremarkable MRI of the brain for age. Electronically Signed   By: Kristine Garbe M.D.   On: 05/31/2019 22:08        Scheduled Meds: . allopurinol  100 mg Oral Daily  . insulin aspart  0-9 Units Subcutaneous Q4H  . lactulose  30 g Oral TID  . lactulose  300 mL Rectal Daily  . metoprolol tartrate  5 mg Intravenous Q6H  . mometasone-formoterol  2 puff Inhalation BID  . pantoprazole (PROTONIX) IV  40 mg Intravenous Q12H  . sodium chloride flush  3 mL Intravenous Q12H  . thiamine injection  100 mg Intravenous Daily   Continuous Infusions: . sodium chloride    . ciprofloxacin 400 mg (06/01/19 2114)  . dextrose 75 mL/hr at 06/02/19 0008  . diltiazem (CARDIZEM) infusion 10 mg/hr (06/02/19 0432)  . famotidine (PEPCID) IV 20 mg (06/01/19 1812)  . magnesium sulfate bolus IVPB 2 g (06/02/19 1251)  . metronidazole 500 mg (06/02/19 1024)  . potassium chloride       LOS: 2 days    Time spent: 35 minutes.     Elmarie Shiley, MD [facility] Pager [phone]  If 7PM-7AM, please contact night-coverage [email or web address] Password [email or web address] 06/02/2019, 1:37 PM

## 2019-06-03 DIAGNOSIS — R1310 Dysphagia, unspecified: Secondary | ICD-10-CM

## 2019-06-03 LAB — COMPREHENSIVE METABOLIC PANEL
ALT: 34 U/L (ref 0–44)
AST: 32 U/L (ref 15–41)
Albumin: 2.6 g/dL — ABNORMAL LOW (ref 3.5–5.0)
Alkaline Phosphatase: 62 U/L (ref 38–126)
Anion gap: 15 (ref 5–15)
BUN: 85 mg/dL — ABNORMAL HIGH (ref 8–23)
CO2: 19 mmol/L — ABNORMAL LOW (ref 22–32)
Calcium: 8 mg/dL — ABNORMAL LOW (ref 8.9–10.3)
Chloride: 107 mmol/L (ref 98–111)
Creatinine, Ser: 3.48 mg/dL — ABNORMAL HIGH (ref 0.44–1.00)
GFR calc Af Amer: 14 mL/min — ABNORMAL LOW (ref >60)
GFR calc non Af Amer: 12 mL/min — ABNORMAL LOW (ref >60)
Glucose, Bld: 110 mg/dL — ABNORMAL HIGH (ref 70–99)
Potassium: 3.4 mmol/L — ABNORMAL LOW (ref 3.5–5.1)
Sodium: 141 mmol/L (ref 135–145)
Total Bilirubin: 5.1 mg/dL — ABNORMAL HIGH (ref 0.3–1.2)
Total Protein: 5.6 g/dL — ABNORMAL LOW (ref 6.5–8.1)

## 2019-06-03 LAB — CBC
HCT: 31.3 % — ABNORMAL LOW (ref 36.0–46.0)
Hemoglobin: 10 g/dL — ABNORMAL LOW (ref 12.0–15.0)
MCH: 24.7 pg — ABNORMAL LOW (ref 26.0–34.0)
MCHC: 31.9 g/dL (ref 30.0–36.0)
MCV: 77.3 fL — ABNORMAL LOW (ref 80.0–100.0)
Platelets: 70 10*3/uL — ABNORMAL LOW (ref 150–400)
RBC: 4.05 MIL/uL (ref 3.87–5.11)
RDW: 25.7 % — ABNORMAL HIGH (ref 11.5–15.5)
WBC: 4.9 10*3/uL (ref 4.0–10.5)
nRBC: 0.8 % — ABNORMAL HIGH (ref 0.0–0.2)

## 2019-06-03 LAB — GLUCOSE, CAPILLARY
Glucose-Capillary: 103 mg/dL — ABNORMAL HIGH (ref 70–99)
Glucose-Capillary: 99 mg/dL (ref 70–99)
Glucose-Capillary: 106 mg/dL — ABNORMAL HIGH (ref 70–99)
Glucose-Capillary: 103 mg/dL — ABNORMAL HIGH (ref 70–99)
Glucose-Capillary: 151 mg/dL — ABNORMAL HIGH (ref 70–99)

## 2019-06-03 MED ORDER — SODIUM BICARBONATE 8.4 % IV SOLN
INTRAVENOUS | Status: DC
  Administered 2019-06-03: 15:00:00 via INTRAVENOUS
  Filled 2019-06-03: qty 150

## 2019-06-03 MED ORDER — POTASSIUM CHLORIDE 10 MEQ/100ML IV SOLN
10.0000 meq | INTRAVENOUS | Status: AC
  Administered 2019-06-03 (×2): 10 meq via INTRAVENOUS
  Filled 2019-06-03: qty 100

## 2019-06-03 MED ORDER — METOPROLOL TARTRATE 5 MG/5ML IV SOLN
5.0000 mg | Freq: Three times a day (TID) | INTRAVENOUS | Status: DC
  Administered 2019-06-03 – 2019-06-04 (×2): 5 mg via INTRAVENOUS
  Filled 2019-06-03 (×2): qty 5

## 2019-06-03 NOTE — Progress Notes (Signed)
[facility] Progress Note    Assessment/ Plan:   1. Acute on chronic kidney injury: Oliguric initially.  Baseline Cr 3.0  Given history of poor PO intake and now copious stool output, suspect intravascular depletion.  Clincially she appears dry.  CXR with pulm edema 6/17 muddies the picture.  Repeat CXR June 12, 2023 notes some prominent pulmonary vasculature suggesting pHTN  and LLL infiltrate vs atalectasis.  Urine output improved after increasing D5W to 75/ hr. TTE in 2019 noted mildly dilated RV so we'll have to follow closely.  Still NPO so will put on IVFs for 12 more hrs--> stool output decreasing so hopefully we can trial off IVFs soon.  Will put some bicarb in fluid for mild metabolic acidosis  2,  Hypernatremia: Resolved  3  Afib: s/p dilt gtt, now on IV metop    4.  Acute encephalopathy: CT and MRI neg, on lactulose, have minimized narcs.  Resolving  5  Possible colitis/ diverticulitis: on cipro/ flagyl, GI following.  6.  Mild pancreatitis- supportive care  7.  Hypokalemia: repleting prn  8.  Dysphagia, n/v: EGD on hold.   9.  Dispo: pending improvement.  Subjective:    Uncharted stool and urine yesterday.  Pt feeling better today, abdomen less tender.     Objective:   BP (!) 102/53 (BP Location: Left Arm)   Pulse (!) 108   Temp 97.9 F (36.6 C) (Oral)   Resp 16   Ht 5\' 4"  (1.626 m)   Wt 88.5 kg   SpO2 100%   BMI 33.49 kg/m   Intake/Output Summary (Last 24 hours) at 06/03/2019 1254 Last data filed at 06/03/2019 1247 Gross per 24 hour  Intake 2181.65 ml  Output 300 ml  Net 1881.65 ml   Weight change:   Physical Exam: GEN: older woman, lying in bed, NAD. Has glasses on  HEENT EOMI PERRL, MM slightly improving NECK slight JVD at 30 degrees PULM normal WOB, clear anteriorly, poor air movement so can't really assess posteriorly very well CV RRR no m/r/g ABD hypoactive bowel sounds, tenderness much improved EXT no LE edema with wrinkling of the  skin NEURO alert and talkative today SKIN improving skin turgor MSK no effusions  Imaging: Dg Chest 1 View  Result Date: 06-12-19 CLINICAL DATA:  Hypertension EXAM: CHEST  1 VIEW COMPARISON:  05/30/2019, 12/19/2018 FINDINGS: Cardiomegaly. Enlarged central pulmonary arteries consistent with pulmonary hypertension. Aortic atherosclerosis. Patchy airspace disease at the left base. No pneumothorax. IMPRESSION: 1. Cardiomegaly.  Patchy atelectasis or infiltrate at the left base. 2. Enlarged appearing central pulmonary vessels suggesting arterial hypertension Electronically Signed   By: Donavan Foil M.D.   On: 2019-06-12 20:31    Labs: BMET Recent Labs  Lab 05/30/19 0417 05/31/19 0409 05/31/19 1207 06/12/2019 0406 06/02/19 0737 06/03/19 0423  NA 145 147* 147* 144 139 141  K 3.7 2.8* 3.2* 3.3* 3.1* 3.4*  CL 103 109 109 109 105 107  CO2 22 19* 20* 20* 20* 19*  GLUCOSE 76 104* 94 108* 210* 110*  BUN 85* 83* 87* 86* 90* 85*  CREATININE 3.45* 3.37* 3.54* 3.77* 3.69* 3.48*  CALCIUM 10.0 9.2 9.2 8.5* 8.1* 8.0*   CBC Recent Labs  Lab 05/30/19 0417 05/31/19 0409 06-12-2019 0406 06/02/19 0737 06/03/19 0423  WBC 5.1 3.3* 4.0 4.4 4.9  NEUTROABS 3.5  --   --   --   --   HGB 10.9* 10.5* 10.7* 9.8* 10.0*  HCT 36.2 35.1* 34.0* 31.6* 31.3*  MCV  79.7* 80.3 79.4* 79.2* 77.3*  PLT 139* 99* 86* 70* 70*    Medications:    . allopurinol  100 mg Oral Daily  . insulin aspart  0-9 Units Subcutaneous Q4H  . metoprolol tartrate  5 mg Intravenous Q8H  . mometasone-formoterol  2 puff Inhalation BID  . pantoprazole (PROTONIX) IV  40 mg Intravenous Q12H  . sodium chloride flush  3 mL Intravenous Q12H  . thiamine injection  100 mg Intravenous Daily      Madelon Lips MD [facility] pgr [phone] 06/03/2019, 12:54 PM

## 2019-06-03 NOTE — Plan of Care (Signed)
  Problem: Education: Goal: Knowledge of General Education information will improve Description: Including pain rating scale, medication(s)/side effects and non-pharmacologic comfort measures Outcome: Progressing   Problem: Health Behavior/Discharge Planning: Goal: Ability to manage health-related needs will improve Outcome: Progressing   Problem: Clinical Measurements: Goal: Will remain free from infection Outcome: Progressing   Problem: Activity: Goal: Risk for activity intolerance will decrease Outcome: Progressing   Problem: Nutrition: Goal: Adequate nutrition will be maintained Outcome: Not Progressing   Problem: Elimination: Goal: Will not experience complications related to bowel motility Outcome: Progressing   Problem: Pain Managment: Goal: General experience of comfort will improve Outcome: Progressing

## 2019-06-03 NOTE — Progress Notes (Signed)
PROGRESS NOTE    Kathleen [name]  GHW:[id number] DOB: 1946-01-09 DOA: 05/30/2019 PCP: Nolene Ebbs, MD    Brief Narrative: 73 year old with past medical history significant for hypertension, diastolic heart failure, chronic kidney disease a stage IV, coronary artery disease, diabetes, paroxysmal A. fib history of GI bleed who presented emergency department for evaluation of persistent nausea vomiting abdominal pain and diarrhea.  Patient has been having above symptoms for the last 2 weeks prior to admission.  She has been having trouble swallowing both solid and liquid food.  She was evaluated as an outpatient by Dr. Carlean Jews plan was to proceed with endoscopy colonoscopy.   Assessment & Plan:   Principal Problem:   Dysphagia Active Problems:   Essential hypertension   Diabetes mellitus type 2, controlled (HCC)   HLD (hyperlipidemia)   CKD (chronic kidney disease) stage 4, GFR 15-29 ml/min (HCC)   Chronic diastolic CHF (congestive heart failure) (HCC)   Atrial fibrillation, chronic   GI bleed   Idiopathic acute pancreatitis without infection or necrosis   Diverticulitis of large intestine without perforation or abscess with bleeding   Acute metabolic encephalopathy;  Patient sleepy this am, subsequently wakeup a little more. She is currently protecting her airway. She has some right side upper arm weakness, unclear chronicity, per patient have for a while. Family was not aware.  -She received morphine overnight. Stop Narcotics.  -electrolytes abnormalities improved.  -CT head negative. MRI negative. ABG with only mild hypoxemia.  -Ammonia level normal. Discussed with Dr Benson Norway ammonia level doesn't necessary correlate with hepatic encephalopathy. She was not able to tolerate lactulose due to lethargy. Lactulose discontinue.    A fib RVR; acute on chronic A fib  On IV metoprolol.  Not on anticoagulation due to risk for bleeding.  Holding Cardizem gtt due to soft BP and HR drop to 60   Dysphagia, Nausea, vomiting;  IV fluids.  For endoscopy next week.   Diarrhea;  GI pathogen negative  Pancreatitis;  Continue with IV fluids.  NPO.  Careful with pain medication and oversedation.  Report improvement of abdominal pain.   Acute on Chronic Kidney diseases stage IV; oliguric.  Cr baseline 3.1---3.8 Monitor renal function.  On low rate IV fluids. Nephrology consulted to help with fluids in patient that is oliguric.  Defer to nephrology Lasix.  Urine out put improved yesterday 500 cc.  started don bicarb gtt by nephrology   Hypernatremia; change IV fluid to D 5. Decrease rate to avoid volume overload.   Hypokalemia; replete IV.   Metabolic acidosis;  On IV fluids.  On IV antibiotics.   Mild colitis;  CT showed possible mild colitis.  Continue with Ciprofloxacin and flagyl. Continue.   Hyperbilirubinemia; Liver enzymes, not significantly elevated.  CT liver without significant abnormality. S/P cholecystectomy.  hepatitis panel negative and trend bili.  GI following.   Pancytopenia;  Hb stable.  Might be related to acute illness.  Peripheral smear no significant schistocyte.  thrombocytopenia suspect related to infection. Monitor. Platelet stable today   Pressure Injury stage II POA sacrum;  Local care.   Chronic diastolic Heart failure; Appears dehydrated.  Monitor on IV fluids.   Diabetes type 2:; Holding Lantus due to poor oral intake  History of gout continue with allopurinol  Hyperlipidemia hold statins  H/O GI bleed:She has history of angiodysplastic lesions of the stomach and duodenal bulb.  Continue Protonix   Pressure Injury 06/22/18 Stage II -  Partial thickness loss of dermis presenting as a  shallow open ulcer with a red, pink wound bed without slough. (Active)  06/22/18 2030  Location: Sacrum  Location Orientation: Medial  Staging: Stage II -  Partial thickness loss of dermis presenting as a shallow open ulcer with a red, pink  wound bed without slough.  Wound Description (Comments):   Present on Admission: Yes     Nutrition Problem: Inadequate oral intake Etiology: inability to eat Estimated body mass index is 33.49 kg/m as calculated from the following:   Height as of this encounter: 5\' 4"  (1.626 m).   Weight as of this encounter: 88.5 kg.   DVT prophylaxis: scd.  Code Status: full code Family Communication: Sister at bedside 6-21 Disposition Plan: remain in the hospital for treatment of hypernatremia, dysphagia, inability to tolerates oral.    Consultants:   GI, Dr Benson Norway   Procedures:      Antimicrobials:  none   Subjective: She is alert, conversant.  Denies abdominal pain today   Objective: Vitals:   06/03/19 0007 06/03/19 0430 06/03/19 0500 06/03/19 0904  BP: (!) 110/53 119/63    Pulse: 65 89    Resp:  20    Temp:  (!) 97.4 F (36.3 C)    TempSrc:  Oral    SpO2:  100%  99%  Weight:   88.5 kg   Height:        Intake/Output Summary (Last 24 hours) at 06/03/2019 1204 Last data filed at 06/03/2019 1141 Gross per 24 hour  Intake 2181.65 ml  Output -  Net 2181.65 ml   Filed Weights   05/30/19 1825 06/03/19 0500  Weight: 86.8 kg 88.5 kg    Examination:  General exam: Alert.  Respiratory system: CTA Cardiovascular system; S 1, S 2 IRR Gastrointestinal system: BS present, soft, nt, mild distended Central nervous system: Alert, follows command Extremities: Symmetric power.  Skin: no rashes    Data Reviewed: I have personally reviewed following labs and imaging studies  CBC: Recent Labs  Lab 05/30/19 0417 05/31/19 0409 06/01/19 0406 06/02/19 0737 06/03/19 0423  WBC 5.1 3.3* 4.0 4.4 4.9  NEUTROABS 3.5  --   --   --   --   HGB 10.9* 10.5* 10.7* 9.8* 10.0*  HCT 36.2 35.1* 34.0* 31.6* 31.3*  MCV 79.7* 80.3 79.4* 79.2* 77.3*  PLT 139* 99* 86* 70* 70*   Basic Metabolic Panel: Recent Labs  Lab 05/31/19 0409 05/31/19 1207 06/01/19 0406 06/02/19 0737 06/03/19  0423  NA 147* 147* 144 139 141  K 2.8* 3.2* 3.3* 3.1* 3.4*  CL 109 109 109 105 107  CO2 19* 20* 20* 20* 19*  GLUCOSE 104* 94 108* 210* 110*  BUN 83* 87* 86* 90* 85*  CREATININE 3.37* 3.54* 3.77* 3.69* 3.48*  CALCIUM 9.2 9.2 8.5* 8.1* 8.0*  MG  --   --   --  1.9  --    GFR: Estimated Creatinine Clearance: 15.3 mL/min (A) (by C-G formula based on SCr of 3.48 mg/dL (H)). Liver Function Tests: Recent Labs  Lab 05/30/19 0417 05/31/19 1207 06/01/19 0406 06/02/19 0737 06/02/19 1522 06/03/19 0423  AST 66* 50* 44* 38  --  32  ALT 40 38 35 34  --  34  ALKPHOS 91 82 73 63  --  62  BILITOT 5.6* 5.3* 4.4* 4.6* 4.8* 5.1*  PROT 7.7 7.0 6.3* 5.8*  --  5.6*  ALBUMIN 4.1 3.6 3.1* 2.7*  --  2.6*   Recent Labs  Lab 05/30/19 0417 05/31/19 1222 06/01/19  0406  LIPASE 127* 579* 175*   Recent Labs  Lab 05/31/19 1207  AMMONIA 35   Coagulation Profile: No results for input(s): INR, PROTIME in the last 168 hours. Cardiac Enzymes: Recent Labs  Lab 05/30/19 0417  TROPONINI 0.06*   BNP (last 3 results) No results for input(s): PROBNP in the last 8760 hours. HbA1C: No results for input(s): HGBA1C in the last 72 hours. CBG: Recent Labs  Lab 06/02/19 2038 06/02/19 2346 06/03/19 0428 06/03/19 0803 06/03/19 1146  GLUCAP 133* 110* 103* 99 106*   Lipid Profile: No results for input(s): CHOL, HDL, LDLCALC, TRIG, CHOLHDL, LDLDIRECT in the last 72 hours. Thyroid Function Tests: No results for input(s): TSH, T4TOTAL, FREET4, T3FREE, THYROIDAB in the last 72 hours. Anemia Panel: No results for input(s): VITAMINB12, FOLATE, FERRITIN, TIBC, IRON, RETICCTPCT in the last 72 hours. Sepsis Labs: No results for input(s): PROCALCITON, LATICACIDVEN in the last 168 hours.  Recent Results (from the past 240 hour(s))  SARS Coronavirus 2 (CEPHEID - Performed in [facility] hospital lab), Hosp Order     Status: None   Collection Time: 05/30/19  6:17 AM   Specimen: Nasopharyngeal Swab  Result  Value Ref Range Status   SARS Coronavirus 2 NEGATIVE NEGATIVE Final    Comment: (NOTE) If result is NEGATIVE SARS-CoV-2 target nucleic acids are NOT DETECTED. The SARS-CoV-2 RNA is generally detectable in upper and lower  respiratory specimens during the acute phase of infection. The lowest  concentration of SARS-CoV-2 viral copies this assay can detect is 250  copies / mL. A negative result does not preclude SARS-CoV-2 infection  and should not be used as the sole basis for treatment or other  patient management decisions.  A negative result may occur with  improper specimen collection / handling, submission of specimen other  than nasopharyngeal swab, presence of viral mutation(s) within the  areas targeted by this assay, and inadequate number of viral copies  (<250 copies / mL). A negative result must be combined with clinical  observations, patient history, and epidemiological information. If result is POSITIVE SARS-CoV-2 target nucleic acids are DETECTED. The SARS-CoV-2 RNA is generally detectable in upper and lower  respiratory specimens dur ing the acute phase of infection.  Positive  results are indicative of active infection with SARS-CoV-2.  Clinical  correlation with patient history and other diagnostic information is  necessary to determine patient infection status.  Positive results do  not rule out bacterial infection or co-infection with other viruses. If result is PRESUMPTIVE POSTIVE SARS-CoV-2 nucleic acids MAY BE PRESENT.   A presumptive positive result was obtained on the submitted specimen  and confirmed on repeat testing.  While 2019 novel coronavirus  (SARS-CoV-2) nucleic acids may be present in the submitted sample  additional confirmatory testing may be necessary for epidemiological  and / or clinical management purposes  to differentiate between  SARS-CoV-2 and other Sarbecovirus currently known to infect humans.  If clinically indicated additional testing  with an alternate test  methodology [phone]) is advised. The SARS-CoV-2 RNA is generally  detectable in upper and lower respiratory sp ecimens during the acute  phase of infection. The expected result is Negative. Fact Sheet for Patients:  StrictlyIdeas.no Fact Sheet for Healthcare Providers: BankingDealers.co.za This test is not yet approved or cleared by the [place] FDA and has been authorized for detection and/or diagnosis of SARS-CoV-2 by FDA under an Emergency Use Authorization (EUA).  This EUA will remain in effect (meaning this test can be used)  for the duration of the COVID-19 declaration under Section 564(b)(1) of the Act, 21 U.S.C. section 360bbb-3(b)(1), unless the authorization is terminated or revoked sooner. Performed at [facility], [place] [address] [place], [place] [address]   Urine Culture     Status: None   Collection Time: 05/31/19  2:26 PM   Specimen: Urine, Catheterized  Result Value Ref Range Status   Specimen Description   Final    URINE, CATHETERIZED Performed at [place] [address], [place], [place] [address]    Special Requests   Final    NONE Performed at [facility], [place] [address] [place], [place] [address]    Culture   Final    NO GROWTH Performed at [facility], [place] [address] [place], [place] [address]    Report Status 06/01/2019 FINAL  Final  Gastrointestinal Panel by PCR , Stool     Status: None   Collection Time: 06/01/19  1:44 PM   Specimen: Stool  Result Value Ref Range Status   Campylobacter species NOT DETECTED NOT DETECTED Final   Plesimonas shigelloides NOT DETECTED NOT DETECTED Final   Salmonella species NOT DETECTED NOT DETECTED Final   Yersinia enterocolitica NOT DETECTED NOT DETECTED Final   Vibrio species NOT DETECTED NOT DETECTED Final   Vibrio cholerae NOT DETECTED NOT DETECTED Final    Enteroaggregative E coli (EAEC) NOT DETECTED NOT DETECTED Final   Enteropathogenic E coli (EPEC) NOT DETECTED NOT DETECTED Final   Enterotoxigenic E coli (ETEC) NOT DETECTED NOT DETECTED Final   Shiga like toxin producing E coli (STEC) NOT DETECTED NOT DETECTED Final   Shigella/Enteroinvasive E coli (EIEC) NOT DETECTED NOT DETECTED Final   Cryptosporidium NOT DETECTED NOT DETECTED Final   Cyclospora cayetanensis NOT DETECTED NOT DETECTED Final   Entamoeba histolytica NOT DETECTED NOT DETECTED Final   Giardia lamblia NOT DETECTED NOT DETECTED Final   Adenovirus F40/41 NOT DETECTED NOT DETECTED Final   Astrovirus NOT DETECTED NOT DETECTED Final   Norovirus GI/GII NOT DETECTED NOT DETECTED Final   Rotavirus A NOT DETECTED NOT DETECTED Final   Sapovirus (I, II, IV, and V) NOT DETECTED NOT DETECTED Final    Comment: Performed at [facility], [address] [place],  [address]         Radiology Studies: Dg Chest 1 View  Result Date: 06/01/2019 CLINICAL DATA:  Hypertension EXAM: CHEST  1 VIEW COMPARISON:  05/30/2019, 12/19/2018 FINDINGS: Cardiomegaly. Enlarged central pulmonary arteries consistent with pulmonary hypertension. Aortic atherosclerosis. Patchy airspace disease at the left base. No pneumothorax. IMPRESSION: 1. Cardiomegaly.  Patchy atelectasis or infiltrate at the left base. 2. Enlarged appearing central pulmonary vessels suggesting arterial hypertension Electronically Signed   By: Donavan Foil M.D.   On: 06/01/2019 20:31        Scheduled Meds: . allopurinol  100 mg Oral Daily  . insulin aspart  0-9 Units Subcutaneous Q4H  . metoprolol tartrate  5 mg Intravenous Q8H  . mometasone-formoterol  2 puff Inhalation BID  . pantoprazole (PROTONIX) IV  40 mg Intravenous Q12H  . sodium chloride flush  3 mL Intravenous Q12H  . thiamine injection  100 mg Intravenous Daily   Continuous Infusions: . sodium chloride    . ciprofloxacin Stopped (06/02/19 2101)  .  famotidine (PEPCID) IV Stopped (06/02/19 2130)  . metronidazole 500 mg (06/03/19 0937)  . potassium chloride 10 mEq (06/03/19 1141)     LOS: 3 days  Time spent: 35 minutes.     Elmarie Shiley, MD [facility] Pager [phone]  If 7PM-7AM, please contact night-coverage [email or web address] Password [facility] 06/03/2019, 12:04 PM

## 2019-06-03 NOTE — Progress Notes (Signed)
Subjective: Kathleen [name] presents today for preventative diabetic foot care with painful, thick toenails 1-5 b/l that she cannot cut and which interfere with daily activities.  Pain is aggravated when wearing enclosed shoe gear and relieved with periodic professional debridement.  She relates she will be moving to [place], [place], to live on a farm with her daughter, who is now retired.  Kathleen Ebbs, MD is her PCP.    Allergies  Allergen Reactions  . Penicillins Hives and Shortness Of Breath    Tolerates Ceftin, Ceftriaxone, and Cefepime Has patient had a PCN reaction causing immediate rash, facial/tongue/throat swelling, SOB or lightheadedness with hypotension: Y Has patient had a PCN reaction causing severe rash involving mucus membranes or skin necrosis: Y Has patient had a PCN reaction that required hospitalization: Y Has patient had a PCN reaction occurring within the last 10 years: N     Objective: Vitals:   05/22/19 1341  Temp: (!) 97.5 F (36.4 C)    Vascular Examination: Capillary refill time immediate x 10 digits.  Dorsalis pedis 1/4 b/l.   Posterior tibial pulses 0/4 b/l.  Digital hair absent x 10 digits.  Skin temperature gradient WNL b/l.  Dermatological Examination: Skin with normal turgor, texture and tone b/l.  No interdigital maceration b/l.  No open wounds b/l.  Toenails 1-5 b/l discolored, thick, dystrophic with subungual debris and pain with palpation to nailbeds due to thickness of nails.  Musculoskeletal: Muscle strength 5/5 to all LE muscle groups.  Hammertoes digits 4, 5 b/l.  No pain, crepitus or joint limitation noted with ROM.   Neurological: Sensation intact 5/5 with 10 gram monofilament.  Vibratory sensation intact.  Hemoglobin A1C Latest Ref Rng & Units 06/07/2018  HGBA1C 4.8 - 5.6 % 6.1(H)  Some recent data might be hidden    Assessment: Painful onychomycosis toenails 1-5 b/l  Encounter for diabetic foot  exam NIDDM  Plan: 1. Continue diabetic foot care principles. Literature dispensed. 2. Toenails 1-5 b/l were debrided in length and girth without iatrogenic bleeding. 3. Patient to continue soft, supportive shoe gear daily. 4. Patient to report any pedal injuries to medical professional immediately. 5. It has been a privilege to treat Kathleen [name]. She was instructed to schedule a visit with a local podiatrist in 3 months to establish care in [place]. She related understanding. 6. Patient/POA to call should there be any concerns.

## 2019-06-03 NOTE — Progress Notes (Signed)
Verbal order received to collect & send c.diff due to pts abdominal discomfort/tenderness. Consulted with CN & MD.  Pt has flexiseal intact & collection container changed to collect sample. Will continue to assess. Stacey Drain

## 2019-06-03 NOTE — Progress Notes (Addendum)
Gastroenterology Progress Note Covering for Drs. Collene Mares and [place]  CC:  dysphagia and diarrhea   Subjective: She feels well today. Daughter at bed side. No N/V. No Abdominal pain. No further diarrhea.   Objective:  Vital signs in last 24 hours: Temp:  [97.4 F (36.3 C)-98.8 F (37.1 C)] 97.9 F (36.6 C) (06/21 1247) Pulse Rate:  [61-108] 108 (06/21 1247) Resp:  [16-20] 16 (06/21 1247) BP: (94-119)/(48-63) 102/53 (06/21 1247) SpO2:  [99 %-100 %] 100 % (06/21 1247) Weight:  [88.5 kg] 88.5 kg (06/21 0500) Last BM Date: 06/02/19 General:   Alert,  Well-developed, in NAD. Heart: Irregular rhythm, no murmur. Pulm: Lungs clear throughout.  Abdomen: Soft, nontender, keyloid scars, + BS x 4 quads. Extremities:  Without edema. Moderate tenderness to pretibial areas bilaterally without edema or erythema. Neurologic:  Alert and  oriented x3, moves all extremities, generalized weakness without focal deficit.  Psych:  Alert and cooperative. Normal mood and affect.  Intake/Output from previous day: 06/20 0701 - 06/21 0700 In: 2081.7 [I.V.:1111.2; IV Piggyback:970.4] Out: -  Intake/Output this shift: Total I/O In: 100 [IV Piggyback:100] Out: 300 [Urine:300]  Lab Results: Recent Labs    06/01/19 0406 06/02/19 0737 06/03/19 0423  WBC 4.0 4.4 4.9  HGB 10.7* 9.8* 10.0*  HCT 34.0* 31.6* 31.3*  PLT 86* 70* 70*   BMET Recent Labs    06/01/19 0406 06/02/19 0737 06/03/19 0423  NA 144 139 141  K 3.3* 3.1* 3.4*  CL 109 105 107  CO2 20* 20* 19*  GLUCOSE 108* 210* 110*  BUN 86* 90* 85*  CREATININE 3.77* 3.69* 3.48*  CALCIUM 8.5* 8.1* 8.0*   LFT Recent Labs    06/02/19 1522 06/03/19 0423  PROT  --  5.6*  ALBUMIN  --  2.6*  AST  --  32  ALT  --  34  ALKPHOS  --  62  BILITOT 4.8* 5.1*  BILIDIR 3.1*  --   IBILI 1.7*  --     Dg Chest 1 View  Result Date: 06/01/2019 CLINICAL DATA:  Hypertension EXAM: CHEST  1 VIEW COMPARISON:  05/30/2019, 12/19/2018 FINDINGS: Cardiomegaly.  Enlarged central pulmonary arteries consistent with pulmonary hypertension. Aortic atherosclerosis. Patchy airspace disease at the left base. No pneumothorax. IMPRESSION: 1. Cardiomegaly.  Patchy atelectasis or infiltrate at the left base. 2. Enlarged appearing central pulmonary vessels suggesting arterial hypertension Electronically Signed   By: Donavan Foil M.D.   On: 06/01/2019 20:31    Assessment / Plan:  1. Mild pancreatitis per abd/pelvic CT. Hyperbilirubinemia. Lipase 175 << 579. No further vomiting. No N/V.  No abdominal pain. T. Bili 5.1 >>4.6. AST 32 << 38. ALT 34. -Zofran PRN  -clear liquid diet  2. Diarrhea, possible diverticulitis distal descending colon per CTAP on Cipro and Flagyl IV. C. Diff and GI panel pending. Flexi Seal with 200cc golden liquid stool 6/20, no additional stool output today.   3. Acute encephalopathy of unclear etiology improving, patient is conversing, alert and oriented. Lactulose discontinued.  4. Dysphagia/vomiting.  -plan for EGD this week with Dr. Benson Norway  6. Microcytic Anemia. HX of upper GI AVMs. H/H stable. Hg 10.0. HCT 31.3. MCV 77.3. - EGD and possible colonoscopy with Dr. Benson Norway this week,   5. Atrial fibrillation stable on Metoprolol IV  6. CKD. Cr. 3.48 << 3.69  Dr. Benson Norway to resume GI patient care tomorrow.    LOS: 3 days   Noralyn Pick NP 06/03/2019, 1:25 PM  Attending Physician Note   I have taken a history, examined the patient and reviewed the chart. I agree with the Advanced Practitioner's note, impression and recommendations.  Resolving pancreatitis, restart clear liquids today  Resolving suspected diverticulitis, continue Cipro and Flagyl Resolving encephalopathy, etiology not clear Elevated t bili, mostly direct bilirubin, etiology not clear Microcytic anemia, dysphagia, consider colonoscopy and EGD this week per Dr. Benson Norway Dr. Benson Norway to resume GI care Monday  Lucio Edward, MD [facility] [phone]

## 2019-06-04 LAB — CBC
HCT: 32.1 % — ABNORMAL LOW (ref 36.0–46.0)
Hemoglobin: 10.3 g/dL — ABNORMAL LOW (ref 12.0–15.0)
MCH: 24.5 pg — ABNORMAL LOW (ref 26.0–34.0)
MCHC: 32.1 g/dL (ref 30.0–36.0)
MCV: 76.2 fL — ABNORMAL LOW (ref 80.0–100.0)
Platelets: 71 10*3/uL — ABNORMAL LOW (ref 150–400)
RBC: 4.21 MIL/uL (ref 3.87–5.11)
RDW: 25.9 % — ABNORMAL HIGH (ref 11.5–15.5)
WBC: 6.8 10*3/uL (ref 4.0–10.5)
nRBC: 0.7 % — ABNORMAL HIGH (ref 0.0–0.2)

## 2019-06-04 LAB — COMPREHENSIVE METABOLIC PANEL
ALT: 38 U/L (ref 0–44)
AST: 32 U/L (ref 15–41)
Albumin: 2.5 g/dL — ABNORMAL LOW (ref 3.5–5.0)
Alkaline Phosphatase: 60 U/L (ref 38–126)
Anion gap: 13 (ref 5–15)
BUN: 79 mg/dL — ABNORMAL HIGH (ref 8–23)
CO2: 21 mmol/L — ABNORMAL LOW (ref 22–32)
Calcium: 7.3 mg/dL — ABNORMAL LOW (ref 8.9–10.3)
Chloride: 109 mmol/L (ref 98–111)
Creatinine, Ser: 3.21 mg/dL — ABNORMAL HIGH (ref 0.44–1.00)
GFR calc Af Amer: 16 mL/min — ABNORMAL LOW (ref >60)
GFR calc non Af Amer: 14 mL/min — ABNORMAL LOW (ref >60)
Glucose, Bld: 137 mg/dL — ABNORMAL HIGH (ref 70–99)
Potassium: 3.6 mmol/L (ref 3.5–5.1)
Sodium: 143 mmol/L (ref 135–145)
Total Bilirubin: 5 mg/dL — ABNORMAL HIGH (ref 0.3–1.2)
Total Protein: 5.5 g/dL — ABNORMAL LOW (ref 6.5–8.1)

## 2019-06-04 LAB — GLUCOSE, CAPILLARY
Glucose-Capillary: 147 mg/dL — ABNORMAL HIGH (ref 70–99)
Glucose-Capillary: 132 mg/dL — ABNORMAL HIGH (ref 70–99)
Glucose-Capillary: 141 mg/dL — ABNORMAL HIGH (ref 70–99)
Glucose-Capillary: 134 mg/dL — ABNORMAL HIGH (ref 70–99)
Glucose-Capillary: 118 mg/dL — ABNORMAL HIGH (ref 70–99)
Glucose-Capillary: 131 mg/dL — ABNORMAL HIGH (ref 70–99)

## 2019-06-04 MED ORDER — PANTOPRAZOLE SODIUM 40 MG PO TBEC
40.0000 mg | DELAYED_RELEASE_TABLET | Freq: Two times a day (BID) | ORAL | Status: DC
  Administered 2019-06-04 – 2019-06-13 (×18): 40 mg via ORAL
  Filled 2019-06-04 (×20): qty 1

## 2019-06-04 MED ORDER — VITAMIN B-1 100 MG PO TABS
100.0000 mg | ORAL_TABLET | Freq: Every day | ORAL | Status: DC
  Administered 2019-06-05 – 2019-06-13 (×8): 100 mg via ORAL
  Filled 2019-06-04 (×10): qty 1

## 2019-06-04 MED ORDER — FAMOTIDINE 20 MG PO TABS
20.0000 mg | ORAL_TABLET | Freq: Every day | ORAL | Status: DC
  Administered 2019-06-04 – 2019-06-10 (×7): 20 mg via ORAL
  Filled 2019-06-04 (×7): qty 1

## 2019-06-04 MED ORDER — ACETAMINOPHEN 500 MG PO TABS
500.0000 mg | ORAL_TABLET | Freq: Once | ORAL | Status: AC
  Administered 2019-06-04: 500 mg via ORAL
  Filled 2019-06-04: qty 1

## 2019-06-04 MED ORDER — METOPROLOL TARTRATE 25 MG PO TABS
25.0000 mg | ORAL_TABLET | Freq: Two times a day (BID) | ORAL | Status: DC
  Administered 2019-06-04 – 2019-06-06 (×4): 25 mg via ORAL
  Filled 2019-06-04 (×5): qty 1

## 2019-06-04 NOTE — Progress Notes (Signed)
PROGRESS NOTE    Kathleen [name]  EKC:[id number] DOB: [birth year] DOA: 05/30/2019 PCP: Nolene Ebbs, MD    Brief Narrative: 73 year old with past medical history significant for hypertension, diastolic heart failure, chronic kidney disease a stage IV, coronary artery disease, diabetes, paroxysmal A. fib history of GI bleed who presented emergency department for evaluation of persistent nausea vomiting abdominal pain and diarrhea.  Patient has been having above symptoms for the last 2 weeks prior to admission.  She has been having trouble swallowing both solid and liquid food.  She was evaluated as an outpatient by Dr. Carlean Jews plan was to proceed with endoscopy colonoscopy.   Assessment & Plan:   Principal Problem:   Dysphagia Active Problems:   Essential hypertension   Diabetes mellitus type 2, controlled (HCC)   HLD (hyperlipidemia)   CKD (chronic kidney disease) stage 4, GFR 15-29 ml/min (HCC)   Chronic diastolic CHF (congestive heart failure) (HCC)   Atrial fibrillation, chronic   GI bleed   Idiopathic acute pancreatitis without infection or necrosis   Diverticulitis of large intestine without perforation or abscess with bleeding   Acute metabolic encephalopathy;  Patient sleepy this am, subsequently wakeup a little more. She is currently protecting her airway. She has some right side upper arm weakness, unclear chronicity, per patient have for a while. Family was not aware.  -She received morphine overnight. Stop Narcotics.  -electrolytes abnormalities improved.  -CT head negative. MRI negative. ABG with only mild hypoxemia.  -Ammonia level normal. Discussed with Dr Benson Norway ammonia level doesn't necessary correlate with hepatic encephalopathy. She was not able to tolerate lactulose due to lethargy. Lactulose discontinue.  Improved.    A fib RVR; acute on chronic A fib  Not on anticoagulation due to risk for bleeding.  Holding Cardizem gtt due to soft BP and HR drop to 60 Now  she is tolerating oral, will resume metoprolol.   Dysphagia, Nausea, vomiting;  IV fluids.  Awaiting Dr Benson Norway to determine time for endoscopy   Diarrhea;  GI pathogen negative No diarrhea in 24 hours.   Pancreatitis;  Continue with IV fluids.  NPO.  Careful with pain medication and oversedation.  Report improvement of abdominal pain.   Acute on Chronic Kidney diseases stage IV; oliguric.  Cr baseline 3.1---3.8 Monitor renal function.  On low rate IV fluids. Nephrology consulted to help with fluids in patient that is oliguric.  Defer to nephrology Lasix.  Received sodium Bicarb gtt. Discontinue to day by nephrology.  Stable.   Hypernatremia; Treated with  IV fluid to D 5. Resolved.   Hypokalemia; resolved.   Metabolic acidosis;  Improved, treated with Bicarb Gtt  Mild colitis;  CT showed possible mild colitis.  Continue with Ciprofloxacin and flagyl. Continue.   Hyperbilirubinemia; Liver enzymes, not significantly elevated.  CT liver without significant abnormality. S/P cholecystectomy.  hepatitis panel negative and trend bili.  GI following.   Pancytopenia;  Hb stable.  Might be related to acute illness.  Peripheral smear no significant schistocyte.  thrombocytopenia suspect related to infection. Monitor. Platelet stable today   Pressure Injury stage II POA sacrum;  Local care.   Chronic diastolic Heart failure; Appears dehydrated.  Monitor on IV fluids.   Diabetes type 2:; Holding Lantus due to poor oral intake  History of gout continue with allopurinol  Hyperlipidemia hold statins  H/O GI bleed:She has history of angiodysplastic lesions of the stomach and duodenal bulb.  Continue Protonix   Pressure Injury 06/22/18 Stage II -  Partial  thickness loss of dermis presenting as a shallow open ulcer with a red, pink wound bed without slough. (Active)  06/22/18 2030  Location: Sacrum  Location Orientation: Medial  Staging: Stage II -  Partial thickness  loss of dermis presenting as a shallow open ulcer with a red, pink wound bed without slough.  Wound Description (Comments):   Present on Admission: Yes     Nutrition Problem: Inadequate oral intake Etiology: inability to eat Estimated body mass index is 34.93 kg/m as calculated from the following:   Height as of this encounter: 5\' 4"  (1.626 m).   Weight as of this encounter: 92.3 kg.   DVT prophylaxis: scd.  Code Status: full code Family Communication: Sister at bedside 6-21 Disposition Plan: remain in the hospital for treatment of hypernatremia, dysphagia, inability to tolerates oral.    Consultants:   GI, Dr Benson Norway   Procedures:      Antimicrobials:  none   Subjective: Alert, conversant. Denies abdominal pain. She was able to tolerates clear diet. No diarrhea in 24 hours.   Objective: Vitals:   06/04/19 0500 06/04/19 0601 06/04/19 1239 06/04/19 1331  BP:  108/88 113/70 (!) 104/55  Pulse:  (!) 110 76 (!) 117  Resp:  17 19   Temp:  98.1 F (36.7 C) 97.7 F (36.5 C)   TempSrc:  Oral Oral   SpO2:  100% 100% 100%  Weight: 92.3 kg     Height:        Intake/Output Summary (Last 24 hours) at 06/04/2019 1402 Last data filed at 06/04/2019 1240 Gross per 24 hour  Intake 1453.43 ml  Output 1100 ml  Net 353.43 ml   Filed Weights   05/30/19 1825 06/03/19 0500 06/04/19 0500  Weight: 86.8 kg 88.5 kg 92.3 kg    Examination:  General exam: Alert, following command Respiratory system: CTA Cardiovascular system; S 1, S 2 RRR Gastrointestinal system: BS present, soft, nt Central nervous system: Alert, follows command.  Extremities: Symmetric power.  Skin: no rashes    Data Reviewed: I have personally reviewed following labs and imaging studies  CBC: Recent Labs  Lab 05/30/19 0417 05/31/19 0409 06/01/19 0406 06/02/19 0737 06/03/19 0423 06/04/19 0418  WBC 5.1 3.3* 4.0 4.4 4.9 6.8  NEUTROABS 3.5  --   --   --   --   --   HGB 10.9* 10.5* 10.7* 9.8* 10.0*  10.3*  HCT 36.2 35.1* 34.0* 31.6* 31.3* 32.1*  MCV 79.7* 80.3 79.4* 79.2* 77.3* 76.2*  PLT 139* 99* 86* 70* 70* 71*   Basic Metabolic Panel: Recent Labs  Lab 05/31/19 1207 06/01/19 0406 06/02/19 0737 06/03/19 0423 06/04/19 0418  NA 147* 144 139 141 143  K 3.2* 3.3* 3.1* 3.4* 3.6  CL 109 109 105 107 109  CO2 20* 20* 20* 19* 21*  GLUCOSE 94 108* 210* 110* 137*  BUN 87* 86* 90* 85* 79*  CREATININE 3.54* 3.77* 3.69* 3.48* 3.21*  CALCIUM 9.2 8.5* 8.1* 8.0* 7.3*  MG  --   --  1.9  --   --    GFR: Estimated Creatinine Clearance: 16.9 mL/min (A) (by C-G formula based on SCr of 3.21 mg/dL (H)). Liver Function Tests: Recent Labs  Lab 05/31/19 1207 06/01/19 0406 06/02/19 0737 06/02/19 1522 06/03/19 0423 06/04/19 0418  AST 50* 44* 38  --  32 32  ALT 38 35 34  --  34 38  ALKPHOS 82 73 63  --  62 60  BILITOT 5.3* 4.4* 4.6*  4.8* 5.1* 5.0*  PROT 7.0 6.3* 5.8*  --  5.6* 5.5*  ALBUMIN 3.6 3.1* 2.7*  --  2.6* 2.5*   Recent Labs  Lab 05/30/19 0417 05/31/19 1222 06/01/19 0406  LIPASE 127* 579* 175*   Recent Labs  Lab 05/31/19 1207  AMMONIA 35   Coagulation Profile: No results for input(s): INR, PROTIME in the last 168 hours. Cardiac Enzymes: Recent Labs  Lab 05/30/19 0417  TROPONINI 0.06*   BNP (last 3 results) No results for input(s): PROBNP in the last 8760 hours. HbA1C: No results for input(s): HGBA1C in the last 72 hours. CBG: Recent Labs  Lab 06/03/19 2107 06/04/19 0102 06/04/19 0425 06/04/19 0805 06/04/19 1236  GLUCAP 151* 147* 132* 141* 134*   Lipid Profile: No results for input(s): CHOL, HDL, LDLCALC, TRIG, CHOLHDL, LDLDIRECT in the last 72 hours. Thyroid Function Tests: No results for input(s): TSH, T4TOTAL, FREET4, T3FREE, THYROIDAB in the last 72 hours. Anemia Panel: No results for input(s): VITAMINB12, FOLATE, FERRITIN, TIBC, IRON, RETICCTPCT in the last 72 hours. Sepsis Labs: No results for input(s): PROCALCITON, LATICACIDVEN in the last 168  hours.  Recent Results (from the past 240 hour(s))  SARS Coronavirus 2 (CEPHEID - Performed in [place] hospital lab), Hosp Order     Status: None   Collection Time: 05/30/19  6:17 AM   Specimen: Nasopharyngeal Swab  Result Value Ref Range Status   SARS Coronavirus 2 NEGATIVE NEGATIVE Final    Comment: (NOTE) If result is NEGATIVE SARS-CoV-2 target nucleic acids are NOT DETECTED. The SARS-CoV-2 RNA is generally detectable in upper and lower  respiratory specimens during the acute phase of infection. The lowest  concentration of SARS-CoV-2 viral copies this assay can detect is 250  copies / mL. A negative result does not preclude SARS-CoV-2 infection  and should not be used as the sole basis for treatment or other  patient management decisions.  A negative result may occur with  improper specimen collection / handling, submission of specimen other  than nasopharyngeal swab, presence of viral mutation(s) within the  areas targeted by this assay, and inadequate number of viral copies  (<250 copies / mL). A negative result must be combined with clinical  observations, patient history, and epidemiological information. If result is POSITIVE SARS-CoV-2 target nucleic acids are DETECTED. The SARS-CoV-2 RNA is generally detectable in upper and lower  respiratory specimens dur ing the acute phase of infection.  Positive  results are indicative of active infection with SARS-CoV-2.  Clinical  correlation with patient history and other diagnostic information is  necessary to determine patient infection status.  Positive results do  not rule out bacterial infection or co-infection with other viruses. If result is PRESUMPTIVE POSTIVE SARS-CoV-2 nucleic acids MAY BE PRESENT.   A presumptive positive result was obtained on the submitted specimen  and confirmed on repeat testing.  While 2019 novel coronavirus  (SARS-CoV-2) nucleic acids may be present in the submitted sample  additional  confirmatory testing may be necessary for epidemiological  and / or clinical management purposes  to differentiate between  SARS-CoV-2 and other Sarbecovirus currently known to infect humans.  If clinically indicated additional testing with an alternate test  methodology [id number]) is advised. The SARS-CoV-2 RNA is generally  detectable in upper and lower respiratory sp ecimens during the acute  phase of infection. The expected result is Negative. Fact Sheet for Patients:  StrictlyIdeas.no Fact Sheet for Healthcare Providers: BankingDealers.co.za This test is not yet approved or cleared by the  [place] Architectural technologist and has been authorized for detection and/or diagnosis of SARS-CoV-2 by FDA under an Print production planner (EUA).  This EUA will remain in effect (meaning this test can be used) for the duration of the COVID-19 declaration under Section 564(b)(1) of the Act, 21 U.S.C. section 360bbb-3(b)(1), unless the authorization is terminated or revoked sooner. Performed at [facility], [place] [address] [place], [place] [address]   Urine Culture     Status: None   Collection Time: 05/31/19  2:26 PM   Specimen: Urine, Catheterized  Result Value Ref Range Status   Specimen Description   Final    URINE, CATHETERIZED Performed at [place] [address] [place], [place] [address]    Special Requests   Final    NONE Performed at [facility], [place] [address], [place], [place] [address]    Culture   Final    NO GROWTH Performed at [facility], [place] [address], [place], [place] [address]    Report Status 06/01/2019 FINAL  Final  Gastrointestinal Panel by PCR , Stool     Status: None   Collection Time: 06/01/19  1:44 PM   Specimen: Stool  Result Value Ref Range Status   Campylobacter species NOT DETECTED NOT DETECTED Final   Plesimonas shigelloides NOT DETECTED NOT  DETECTED Final   Salmonella species NOT DETECTED NOT DETECTED Final   Yersinia enterocolitica NOT DETECTED NOT DETECTED Final   Vibrio species NOT DETECTED NOT DETECTED Final   Vibrio cholerae NOT DETECTED NOT DETECTED Final   Enteroaggregative E coli (EAEC) NOT DETECTED NOT DETECTED Final   Enteropathogenic E coli (EPEC) NOT DETECTED NOT DETECTED Final   Enterotoxigenic E coli (ETEC) NOT DETECTED NOT DETECTED Final   Shiga like toxin producing E coli (STEC) NOT DETECTED NOT DETECTED Final   Shigella/Enteroinvasive E coli (EIEC) NOT DETECTED NOT DETECTED Final   Cryptosporidium NOT DETECTED NOT DETECTED Final   Cyclospora cayetanensis NOT DETECTED NOT DETECTED Final   Entamoeba histolytica NOT DETECTED NOT DETECTED Final   Giardia lamblia NOT DETECTED NOT DETECTED Final   Adenovirus F40/41 NOT DETECTED NOT DETECTED Final   Astrovirus NOT DETECTED NOT DETECTED Final   Norovirus GI/GII NOT DETECTED NOT DETECTED Final   Rotavirus A NOT DETECTED NOT DETECTED Final   Sapovirus (I, II, IV, and V) NOT DETECTED NOT DETECTED Final    Comment: Performed at [facility], [address], [place], [place] [address]         Radiology Studies: No results found.      Scheduled Meds: . acetaminophen  500 mg Oral Once  . allopurinol  100 mg Oral Daily  . insulin aspart  0-9 Units Subcutaneous Q4H  . metoprolol tartrate  5 mg Intravenous Q8H  . mometasone-formoterol  2 puff Inhalation BID  . pantoprazole (PROTONIX) IV  40 mg Intravenous Q12H  . sodium chloride flush  3 mL Intravenous Q12H  . thiamine injection  100 mg Intravenous Daily   Continuous Infusions: . sodium chloride    . ciprofloxacin 400 mg (06/03/19 2036)  . famotidine (PEPCID) IV 20 mg (06/03/19 1750)  . metronidazole 500 mg (06/04/19 1002)     LOS: 4 days    Time spent: 35 minutes.     Elmarie Shiley, MD [facility] Pager [phone]  If 7PM-7AM, please contact night-coverage  [email or web address] Password [email or web address] 06/04/2019, 2:02 PM

## 2019-06-04 NOTE — Progress Notes (Signed)
Physical Therapy Treatment Patient Details Name: Kathleen [name] MRN: [id number] DOB: 06/15/1946 Today's Date: 06/04/2019    History of Present Illness 73 y.o. female with medical history significant of hypertension, diastolic congestive heart failure, CKD stage IV, coronary artery disease, diabetes mellitus, paroxysmal A. fib, GI bleed who was brought to the emergency department from home for the evaluation of persistent nausea, vomiting, abdominal pain and diarrhea. CT abdomen/pelvis shows mild acute pancreatitis    PT Comments    Pt complaining of bilateral LE and foot pain upon arrival, but willing to attempt to mobilize with PT. Pt required mod assist for bed mobility, and max-total assist for transfer to drop arm recliner at bedside via squat pivot. Pt very limited by weakness, fatigue, and pain this session. PT updated recommendations to reflect SNF level of care given pt's physical decline compared to eval. Pt also lives alone. Per pt, she does not want to go to SNF and states her sister will move her to [place] if needed. Still, pt is requiring significant physical assist for basic bed mobility and transfer, unsure if home with sister is a safe d/c option at this time.  PT to continue to follow acutely.   O2 sat: 100% on 2LO2 HR: 117-141 bpm, afib   Follow Up Recommendations  SNF;Supervision/Assistance - 24 hour     Equipment Recommendations  None recommended by PT    Recommendations for Other Services       Precautions / Restrictions Precautions Precautions: Fall Restrictions Weight Bearing Restrictions: No    Mobility  Bed Mobility Overal bed mobility: Needs Assistance Bed Mobility: Supine to Sit;Rolling Rolling: Mod assist   Supine to sit: Mod assist;HOB elevated     General bed mobility comments: Mod assist for LE translation to EOB, trunk elevation off bed with use of HHA. PT assisted pt in scooting to EOB with use of bed pads. Mod assist for rolling in recliner for  placement of lift pad.  Transfers Overall transfer level: Needs assistance   Transfers: Squat Pivot Transfers     Squat pivot transfers: Max assist;From elevated surface;Total assist     General transfer comment: Pt contributing 10%, required max assist for scooting at EOB for anterior trunk leaning and hip lift off bed, and max assist for squat pivot to recliner. Pt was total assist for placement in recliner and scooting back with use of bed pads.  Ambulation/Gait Ambulation/Gait assistance: (NT)               Stairs             Wheelchair Mobility    Modified Rankin (Stroke Patients Only)       Balance Overall balance assessment: Needs assistance Sitting-balance support: Feet supported;No upper extremity supported Sitting balance-Leahy Scale: Fair       Standing balance-Leahy Scale: Zero Standing balance comment: unable to come to standing, required heavy max assist for squat pivot to recliner at bedside                            Cognition Arousal/Alertness: Awake/alert Behavior During Therapy: WFL for tasks assessed/performed Overall Cognitive Status: Within Functional Limits for tasks assessed                                        Exercises General Exercises - Lower Extremity Ankle Circles/Pumps: AROM;Both;20  reps;Supine Quad Sets: AROM;Both;10 reps;Supine Gluteal Sets: AROM;Both;10 reps;Supine Heel Slides: AAROM;Both;10 reps;Supine    General Comments        Pertinent Vitals/Pain Pain Assessment: Faces Faces Pain Scale: Hurts little more Pain Location: LEs and feet, with mobility Pain Descriptors / Indicators: Sore;Grimacing Pain Intervention(s): Repositioned;Patient requesting pain meds-RN notified;Limited activity within patient's tolerance;Monitored during session    Home Living                      Prior Function            PT Goals (current goals can now be found in the care plan section)  Acute Rehab PT Goals PT Goal Formulation: With patient Time For Goal Achievement: 06/13/19 Potential to Achieve Goals: Good Progress towards PT goals: Not progressing toward goals - comment(Pt requiring much more physical assist for mobility this session)    Frequency    Min 2X/week      PT Plan Discharge plan needs to be updated;Frequency needs to be updated    Co-evaluation              AM-PAC PT "6 Clicks" Mobility   Outcome Measure  Help needed turning from your back to your side while in a flat bed without using bedrails?: A Lot Help needed moving from lying on your back to sitting on the side of a flat bed without using bedrails?: A Lot Help needed moving to and from a bed to a chair (including a wheelchair)?: Total Help needed standing up from a chair using your arms (e.g., wheelchair or bedside chair)?: Total Help needed to walk in hospital room?: Total Help needed climbing 3-5 steps with a railing? : Total 6 Click Score: 8    End of Session Equipment Utilized During Treatment: Gait belt;Oxygen Activity Tolerance: Patient limited by fatigue;Patient limited by pain Patient left: with call bell/phone within reach;with family/visitor present;in chair;with chair alarm set Nurse Communication: Mobility status;Need for lift equipment(maximove lift pad placed) PT Visit Diagnosis: Unsteadiness on feet (R26.81);Other abnormalities of gait and mobility (R26.89);Muscle weakness (generalized) (M62.81)     Time: 8891-6945 PT Time Calculation (min) (ACUTE ONLY): 43 min  Charges:  $Therapeutic Exercise: 8-22 mins $Therapeutic Activity: 23-37 mins                    Julien Girt, PT Acute Rehabilitation Services Pager [phone]  Office [phone]   Dagmar Adcox D Cniyah Sproull 06/04/2019, 2:08 PM

## 2019-06-04 NOTE — Progress Notes (Signed)
SLP Cancellation Note  Patient Details Name: Kathleen [name] MRN: [id number] DOB: 1946-07-13   Cancelled treatment:       Reason Eval/Treat Not Completed: Other (comment)(Consult with MD. SLP will hold BSE until after EGD completed.)  Kathleen [name] B. [facility], CCC-SLP Speech Language Pathologist [phone]  Shonna Chock 06/04/2019, 11:01 AM

## 2019-06-04 NOTE — Progress Notes (Signed)
Pharmacy IV to PO conversion  The patient is receiving pantoprazole, famotidine, and thiamine by the intravenous route.  Based on criteria approved by the Pharmacy and [place], the medications are being converted to the equivalent oral dose form.   No active GI bleeding or impaired absorption  Not s/p esophagectomy  Documented ability to take oral medications for > 24 hr  Plan to continue treatment for at least 1 day  If you have any questions about this conversion, please contact the Pharmacy Department (ext [phone]).  Thank you.  Reuel Boom, PharmD, BCPS [phone] 06/04/2019, 3:09 PM

## 2019-06-04 NOTE — Care Management Important Message (Signed)
Important Message  Patient Details IM Letter given to [name] SW to present to the Patient Name: Kathleen [name] MRN: [id number] Date of Birth: 1946/02/07   Medicare Important Message Given:  Yes     Kerin Salen 06/04/2019, 2:05 PM

## 2019-06-04 NOTE — Progress Notes (Signed)
Subjective: Patient seems to be doing well and denies having any abdominal pain., nausea or vomiting. She is alert and oriented and in no acute distress.She is tolerating full liquids well.  Her diarrhea has resolved.   Objective: Vital signs in last 24 hours: Temp:  [97.4 F (36.3 C)-98.1 F (36.7 C)] 98.1 F (36.7 C) (06/22 0601) Pulse Rate:  [107-110] 110 (06/22 0601) Resp:  [16-17] 17 (06/22 0601) BP: (102-111)/(53-88) 108/88 (06/22 0601) SpO2:  [100 %] 100 % (06/22 0601) Weight:  [92.3 kg] 92.3 kg (06/22 0500) Last BM Date: 06/02/19  Intake/Output from previous day: 06/21 0701 - 06/22 0700 In: 1433.4 [P.O.:240; I.V.:524.5; IV Piggyback:668.9] Out: 1400 [Urine:1400] Intake/Output this shift: No intake/output data recorded.  General appearance: alert, cooperative, appears stated age, fatigued and no distress Resp: clear to auscultation bilaterally Cardio: irregular rate and rhythm, S1, S2 normal, no murmur, click, rub or gallop GI: soft, non-tender; bowel sounds normal; no masses,  no organomegaly Extremities: extremities normal, atraumatic, no cyanosis or edema  Lab Results: Recent Labs    06/02/19 0737 06/03/19 0423 06/04/19 0418  WBC 4.4 4.9 6.8  HGB 9.8* 10.0* 10.3*  HCT 31.6* 31.3* 32.1*  PLT 70* 70* 71*   BMET Recent Labs    06/02/19 0737 06/03/19 0423 06/04/19 0418  NA 139 141 143  K 3.1* 3.4* 3.6  CL 105 107 109  CO2 20* 19* 21*  GLUCOSE 210* 110* 137*  BUN 90* 85* 79*  CREATININE 3.69* 3.48* 3.21*  CALCIUM 8.1* 8.0* 7.3*   LFT Recent Labs    06/02/19 1522  06/04/19 0418  PROT  --    < > 5.5*  ALBUMIN  --    < > 2.5*  AST  --    < > 32  ALT  --    < > 38  ALKPHOS  --    < > 60  BILITOT 4.8*   < > 5.0*  BILIDIR 3.1*  --   --   IBILI 1.7*  --   --    < > = values in this interval not displayed.   Studies/Results: No results found.  Medications: I have reviewed the patient's current medications.  Assessment/Plan: 1) Dysphagia-will  advance patient's diet and see if she tolerates a full liquid diet. If she has problems with advancing her diet an EGD will be done. 2) Acute idiopathic pancreatitis-seems to have resolved/Hyperbilirubinemia.Marland Kitchen  3) Acute metabolic encephalopathy-improved.  4) Diverticulosis on CT scan/?Mild colitis.on CT scan. 5) HTN/Hyperlipidemia/CHF/Atrial fibrillation. 6) AODM/CKD. 7) Anemia of chronic disease.  LOS: 4 days   Juanita Craver 06/04/2019, 12:28 PM

## 2019-06-04 NOTE — Progress Notes (Signed)
[facility] Progress Note    Assessment/ Plan:   1. Acute on chronic kidney injury: baseline creat 3.0, we started IVF"s given exam and UOP improved, creat remaining stable in 3.0- 3.7 range which is baseline.  Diarrhea slowing down per patient.  When diarrhea resolved can dc IVF"s or if any signs of edema.  No new suggestions, will sign off.  Pt will f/u at [place] w/ her usual neph post dc (she can't remember which MD it is).   2,  Hypernatremia: Resolved  3  Afib: s/p dilt gtt  4.  Acute encephalopathy: CT and MRI neg, on lactulose, have minimized narcs.  Resolving  5  Possible colitis/ diverticulitis: on cipro/ flagyl, GI following.  6.  Mild pancreatitis- supportive care  7.  Hypokalemia: repleting prn  8.  Dysphagia, n/v: EGD on hold.   9.  Dispo: pending improvement.   Kelly Splinter, MD 06/04/2019, 3:29 PM    Subjective:    Creat stable, pt in good spirits.     Objective:   BP (!) 104/55   Pulse (!) 117   Temp 97.7 F (36.5 C) (Oral)   Resp 19   Ht 5\' 4"  (1.626 m)   Wt 92.3 kg   SpO2 100%   BMI 34.93 kg/m   Intake/Output Summary (Last 24 hours) at 06/04/2019 1527 Last data filed at 06/04/2019 1240 Gross per 24 hour  Intake 1453.43 ml  Output 1100 ml  Net 353.43 ml   Weight change: 3.8 kg  Physical Exam: GEN: older woman, lying in bed, NAD. Has glasses on  HEENT EOMI PERRL, MM slightly improving NECK slight JVD at 30 degrees PULM normal WOB, clear anteriorly CV RRR no m/r/g ABD hypoactive bowel sounds, tenderness much improved EXT no LE edema with wrinkling of the skin NEURO alert and talkative today SKIN improving skin turgor MSK no effusions  Imaging: No results found.  Labs: BMET Recent Labs  Lab 05/30/19 0417 05/31/19 0409 05/31/19 1207 06/01/19 0406 06/02/19 0737 06/03/19 0423 06/04/19 0418  NA 145 147* 147* 144 139 141 143  K 3.7 2.8* 3.2* 3.3* 3.1* 3.4* 3.6  CL 103 109 109 109 105 107 109  CO2 22 19* 20* 20*  20* 19* 21*  GLUCOSE 76 104* 94 108* 210* 110* 137*  BUN 85* 83* 87* 86* 90* 85* 79*  CREATININE 3.45* 3.37* 3.54* 3.77* 3.69* 3.48* 3.21*  CALCIUM 10.0 9.2 9.2 8.5* 8.1* 8.0* 7.3*   CBC Recent Labs  Lab 05/30/19 0417  06/01/19 0406 06/02/19 0737 06/03/19 0423 06/04/19 0418  WBC 5.1   < > 4.0 4.4 4.9 6.8  NEUTROABS 3.5  --   --   --   --   --   HGB 10.9*   < > 10.7* 9.8* 10.0* 10.3*  HCT 36.2   < > 34.0* 31.6* 31.3* 32.1*  MCV 79.7*   < > 79.4* 79.2* 77.3* 76.2*  PLT 139*   < > 86* 70* 70* 71*   < > = values in this interval not displayed.    Medications:    . allopurinol  100 mg Oral Daily  . famotidine  20 mg Oral q1800  . insulin aspart  0-9 Units Subcutaneous Q4H  . metoprolol tartrate  25 mg Oral BID  . mometasone-formoterol  2 puff Inhalation BID  . pantoprazole  40 mg Oral BID  . sodium chloride flush  3 mL Intravenous Q12H  . [START ON 06/05/2019] thiamine  100 mg Oral Daily

## 2019-06-04 NOTE — Plan of Care (Signed)
  Problem: Education: Goal: Knowledge of General Education information will improve Description: Including pain rating scale, medication(s)/side effects and non-pharmacologic comfort measures Outcome: Completed/Met   Problem: Activity: Goal: Risk for activity intolerance will decrease Outcome: Completed/Met   Problem: Coping: Goal: Level of anxiety will decrease Outcome: Completed/Met   Problem: Pain Managment: Goal: General experience of comfort will improve Outcome: Completed/Met

## 2019-06-05 LAB — CBC
HCT: 32.6 % — ABNORMAL LOW (ref 36.0–46.0)
Hemoglobin: 10.5 g/dL — ABNORMAL LOW (ref 12.0–15.0)
MCH: 24.6 pg — ABNORMAL LOW (ref 26.0–34.0)
MCHC: 32.2 g/dL (ref 30.0–36.0)
MCV: 76.3 fL — ABNORMAL LOW (ref 80.0–100.0)
Platelets: 80 10*3/uL — ABNORMAL LOW (ref 150–400)
RBC: 4.27 MIL/uL (ref 3.87–5.11)
RDW: 26.2 % — ABNORMAL HIGH (ref 11.5–15.5)
WBC: 8.7 10*3/uL (ref 4.0–10.5)
nRBC: 1.3 % — ABNORMAL HIGH (ref 0.0–0.2)

## 2019-06-05 LAB — COMPREHENSIVE METABOLIC PANEL
ALT: 59 U/L — ABNORMAL HIGH (ref 0–44)
AST: 41 U/L (ref 15–41)
Albumin: 2.7 g/dL — ABNORMAL LOW (ref 3.5–5.0)
Alkaline Phosphatase: 68 U/L (ref 38–126)
Anion gap: 17 — ABNORMAL HIGH (ref 5–15)
BUN: 86 mg/dL — ABNORMAL HIGH (ref 8–23)
CO2: 19 mmol/L — ABNORMAL LOW (ref 22–32)
Calcium: 7.3 mg/dL — ABNORMAL LOW (ref 8.9–10.3)
Chloride: 107 mmol/L (ref 98–111)
Creatinine, Ser: 3.36 mg/dL — ABNORMAL HIGH (ref 0.44–1.00)
GFR calc Af Amer: 15 mL/min — ABNORMAL LOW (ref >60)
GFR calc non Af Amer: 13 mL/min — ABNORMAL LOW (ref >60)
Glucose, Bld: 120 mg/dL — ABNORMAL HIGH (ref 70–99)
Potassium: 3.8 mmol/L (ref 3.5–5.1)
Sodium: 143 mmol/L (ref 135–145)
Total Bilirubin: 5.4 mg/dL — ABNORMAL HIGH (ref 0.3–1.2)
Total Protein: 5.9 g/dL — ABNORMAL LOW (ref 6.5–8.1)

## 2019-06-05 LAB — GLUCOSE, CAPILLARY
Glucose-Capillary: 123 mg/dL — ABNORMAL HIGH (ref 70–99)
Glucose-Capillary: 126 mg/dL — ABNORMAL HIGH (ref 70–99)
Glucose-Capillary: 129 mg/dL — ABNORMAL HIGH (ref 70–99)
Glucose-Capillary: 137 mg/dL — ABNORMAL HIGH (ref 70–99)
Glucose-Capillary: 141 mg/dL — ABNORMAL HIGH (ref 70–99)
Glucose-Capillary: 144 mg/dL — ABNORMAL HIGH (ref 70–99)

## 2019-06-05 NOTE — TOC Initial Note (Signed)
Transition of Care [facility]) - Initial/Assessment Note    Patient Details  Name: Kathleen [name] MRN: [id number] Date of Birth: Nov 26, 1946  Transition of [facility]) CM/SW Contact:    Wende Neighbors, LCSW Phone Number: 06/05/2019, 4:59 PM  Clinical Narrative:      CSW  Met patient at bedside to discuss discharge plans and offers support. Patient stated she lives by herself but has support from care givers. [facility] provides private duty care for patient. [facility] stated they care for patient 40 hours a week, 7 days a week. Patient stated she does not want to go to rehab. Patient stated she would like to go home with home health. Patient stated she has a large family and they would be able to provide care for her at home on top of the [place] PT. Patient addiment about going home. CSW contacted [facility] agency that will be able to provide services to patient at discharge      Expected Discharge Plan: [place] Barriers to Discharge: Continued Medical Work up   Patient Goals and CMS Choice Patient states their goals for this hospitalization and ongoing recovery are:: to go home and be with family CMS Medicare.gov Compare Post Acute Care list provided to:: Patient Choice offered to / list presented to : Patient  Expected Discharge Plan and Services Expected Discharge Plan: [place] In-house Referral: Clinical Social Work   Post Acute Care Choice: [place] arrangements for the past 2 months: [place] Expected Discharge Date: (unknown)                           [place] Agency: (Advance) Date HH Agency Contacted: 06/05/19 Time [place]: [phone] Representative spoke with at [place]: Santiago Glad  Prior Living Arrangements/Services Living arrangements for the past 2 months: [place] Lives with:: Self Patient language and need for interpreter reviewed:: Yes Do you feel safe going back to the  place where you live?: Yes      Need for Family Participation in Patient Care: Yes (Comment) Care giver support system in place?: Yes (comment) Current home services: Home PT, Home OT Criminal Activity/Legal Involvement Pertinent to Current Situation/Hospitalization: No - Comment as needed  Activities of Daily Living Home Assistive Devices/Equipment: Cane (specify quad or straight), CBG Meter, Eyeglasses, Electric scooter, Nebulizer, Oxygen, Shower chair with back, Raised toilet seat with rails, Wheelchair, Environmental consultant (specify type)(front wheeled Stencel, single point cane) ADL Screening (condition at time of admission) Patient's cognitive ability adequate to safely complete daily activities?: Yes Is the patient deaf or have difficulty hearing?: No Does the patient have difficulty seeing, even when wearing glasses/contacts?: No Does the patient have difficulty concentrating, remembering, or making decisions?: No Patient able to express need for assistance with ADLs?: Yes Does the patient have difficulty dressing or bathing?: Yes Independently performs ADLs?: No Communication: Independent Dressing (OT): Needs assistance Is this a change from baseline?: Pre-admission baseline Grooming: Needs assistance Is this a change from baseline?: Pre-admission baseline Feeding: Needs assistance Is this a change from baseline?: Pre-admission baseline Bathing: Needs assistance Is this a change from baseline?: Pre-admission baseline Toileting: Needs assistance Is this a change from baseline?: Change from baseline, expected to last >3days In/Out Bed: Needs assistance Is this a change from baseline?: Change from baseline, expected to last >3 days Walks in Home: Needs assistance Is this a change from baseline?: Change  from baseline, expected to last >3 days Does the patient have difficulty walking or climbing stairs?: Yes Weakness of Legs: Both Weakness of Arms/Hands: Both  Permission  Sought/Granted Permission sought to share information with : Family Supports Permission granted to share information with : Yes, Verbal Permission Granted  Share Information with NAME: Lezlie Octave     Permission granted to share info w Relationship: sister  Permission granted to share info w Contact Information: [phone]  Emotional Assessment Appearance:: Appears stated age Attitude/Demeanor/Rapport: Engaged Affect (typically observed): Accepting Orientation: : Oriented to Self, Oriented to Place, Oriented to  Time, Oriented to Situation Alcohol / Substance Use: Not Applicable Psych Involvement: No (comment)  Admission diagnosis:  Hypoglycemia [E16.2] Acute on chronic diastolic congestive heart failure (HCC) [I50.33] Dysphagia, unspecified type [R13.10] Patient Active Problem List   Diagnosis Date Noted  . Idiopathic acute pancreatitis without infection or necrosis   . Diverticulitis of large intestine without perforation or abscess with bleeding   . Dysphagia 05/30/2019  . AVM (arteriovenous malformation) of duodenum, acquired   . AVM (arteriovenous malformation) of stomach, acquired with hemorrhage   . AKI (acute kidney injury) ([place]) 02/02/2019  . Symptomatic anemia 12/19/2018  . GIB (gastrointestinal bleeding) 09/12/2018  . GI bleed 09/11/2018  . Malnutrition of moderate degree 06/26/2018  . Pressure injury of skin 06/26/2018  . Hypotension due to hypovolemia 06/22/2018  . Acute metabolic encephalopathy 22/63/3354  . Transaminitis 06/22/2018  . Anemia due to GI blood loss 06/22/2018  . Epigastric pain 06/22/2018  . Decreased oral intake 06/22/2018  . Hypovolemic shock ([place]) 06/22/2018  . Duodenitis 06/07/2018  . Chronic gastritis 06/07/2018  . Abnormal LFTs   . Upper GI bleed   . Melena 06/04/2018  . Atrial fibrillation, chronic 06/04/2018  . SOB (shortness of breath) 03/08/2018  . SIRS (systemic inflammatory response syndrome) ([place]) 12/04/2017  . Hypothermia  12/04/2017  . Elevated LFTs 12/04/2017  . Sepsis ([place]) 12/04/2017  . Atrial flutter ([place]) 03/17/2015  . NSTEMI (non-ST elevated myocardial infarction) ([place])   . Chronic diastolic CHF (congestive heart failure) ([place])   . Type 2 diabetes mellitus with hypoglycemia without coma ([place])   . Paroxysmal atrial fibrillation ([place]) 01/25/2015  . Non-ST elevation MI (NSTEMI) ([place]) 01/23/2015  . Diabetes mellitus type 2, controlled ([place]) 01/23/2015  . HLD (hyperlipidemia)   . CKD (chronic kidney disease) stage 4, GFR 15-29 ml/min (HCC)   . Acute congestive heart failure ([place]) 09/17/2014  . Asthma 03/26/2014  . GERD (gastroesophageal reflux disease) 03/26/2014  . Smoker 03/26/2014  . Postmenopausal bleeding 01/10/2014  . Carotid stenosis 01/04/2012  . Carpal tunnel syndrome on left 11/26/2011  . Transient ischemic attack on medication 11/02/2011  . Hyperlipidemia with target LDL less than 70 09/17/2008  . OBESITY 09/17/2008  . Essential hypertension 09/17/2008  . Coronary atherosclerosis 09/17/2008   PCP:  Nolene Ebbs, MD Pharmacy:   [id number] [place] [id number]: [phone]: [phone]    Social Determinants of Health (SDOH) Interventions    Readmission Risk Interventions No flowsheet data found.

## 2019-06-05 NOTE — Progress Notes (Signed)
Subjective: No complaints.  Feeling well.  Objective: Vital signs in last 24 hours: Temp:  [97.5 F (36.4 C)-97.7 F (36.5 C)] 97.6 F (36.4 C) (06/23 1154) Pulse Rate:  [99-120] 99 (06/23 1154) Resp:  [18-20] 18 (06/23 1154) BP: (93-102)/(53-71) 99/53 (06/23 1154) SpO2:  [100 %] 100 % (06/23 1154) Weight:  [92.8 kg] 92.8 kg (06/23 0415) Last BM Date: 06/04/19  Intake/Output from previous day: 06/22 0701 - 06/23 0700 In: 440 [P.O.:240; IV Piggyback:200] Out: 400 [Urine:400] Intake/Output this shift: Total I/O In: 120 [P.O.:120] Out: 100 [Urine:100]  General appearance: alert and no distress GI: soft, non-tender; bowel sounds normal; no masses,  no organomegaly  Lab Results: Recent Labs    06/03/19 0423 06/04/19 0418 06/05/19 0528  WBC 4.9 6.8 8.7  HGB 10.0* 10.3* 10.5*  HCT 31.3* 32.1* 32.6*  PLT 70* 71* 80*   BMET Recent Labs    06/03/19 0423 06/04/19 0418 06/05/19 0528  NA 141 143 143  K 3.4* 3.6 3.8  CL 107 109 107  CO2 19* 21* 19*  GLUCOSE 110* 137* 120*  BUN 85* 79* 86*  CREATININE 3.48* 3.21* 3.36*  CALCIUM 8.0* 7.3* 7.3*   LFT Recent Labs    06/02/19 1522  06/05/19 0528  PROT  --    < > 5.9*  ALBUMIN  --    < > 2.7*  AST  --    < > 41  ALT  --    < > 59*  ALKPHOS  --    < > 68  BILITOT 4.8*   < > 5.4*  BILIDIR 3.1*  --   --   IBILI 1.7*  --   --    < > = values in this interval not displayed.   PT/INR No results for input(s): LABPROT, INR in the last 72 hours. Hepatitis Panel No results for input(s): HEPBSAG, HCVAB, HEPAIGM, HEPBIGM in the last 72 hours. C-Diff No results for input(s): CDIFFTOX in the last 72 hours. Fecal Lactopherrin No results for input(s): FECLLACTOFRN in the last 72 hours.  Studies/Results: No results found.  Medications:  Scheduled: . allopurinol  100 mg Oral Daily  . famotidine  20 mg Oral q1800  . insulin aspart  0-9 Units Subcutaneous Q4H  . metoprolol tartrate  25 mg Oral BID  .  mometasone-formoterol  2 puff Inhalation BID  . pantoprazole  40 mg Oral BID  . sodium chloride flush  3 mL Intravenous Q12H  . thiamine  100 mg Oral Daily   Continuous: . sodium chloride    . ciprofloxacin 400 mg (06/04/19 1912)  . metronidazole 500 mg (06/05/19 0806)    Assessment/Plan: 1) Encephalopathy resolved. 2) Pancreatitis resolved. 3) ? Diverticulitis. 4) Deconditioning. 5) Hyperbilirubinemia - ? etiology   All of her GI symptoms have resolved.  In retrospect, her complains of gagging along with the upper abdominal pain, elevated lipase, and findings suspicious for pancreatitis on the CT scan, point to pancreatitis as being the source of her hospitalization.  The etiology of the pancreatitis is unknown as there are no obvious precipitating factors.  She feels well at this time and he mentation is back to baseline.    Plan: 1) Okay to advance diet. 2) Finish antibiotic treatment for diverticulitis x 7 days, total. 3) Currently no plans for endoscopy or colonoscopy.  Signing off at this time, but call if there are any questions or changes to her clinical status.  LOS: 5 days   Kerrianne Jeng D 06/05/2019,  2:53 PM

## 2019-06-05 NOTE — Evaluation (Signed)
Occupational Therapy Evaluation Patient Details Name: Kathleen [name] MRN: [id number] DOB: 10/22/1946 Today's Date: 06/05/2019    History of Present Illness 73 y.o. female with medical history significant of hypertension, diastolic congestive heart failure, CKD stage IV, coronary artery disease, diabetes mellitus, paroxysmal A. fib, GI bleed who was brought to the emergency department from home for the evaluation of persistent nausea, vomiting, abdominal pain and diarrhea. CT abdomen/pelvis shows mild acute pancreatitis   Clinical Impression   Pt admitted with GI the above. Pt currently with functional limitations due to the deficits listed below (see OT Problem List).  Pt will benefit from skilled OT to increase their safety and independence with ADL and functional mobility for ADL to facilitate discharge to venue listed below.   Pt will need A with ADL activity.  Pt has limited A at home and will need consistent A with bathing, toileting and all transfers     Follow Up Recommendations  SNF          Precautions / Restrictions Precautions Precautions: Fall      Mobility Bed Mobility Overal bed mobility: Needs Assistance Bed Mobility: Sit to Supine;Supine to Sit Rolling: Mod assist   Supine to sit: Mod assist Sit to supine: Mod assist   General bed mobility comments: Pt overall Mod A with bed mobility a well as sitting EOB.  Pt has limited A at home and will likely need A with ADL actiivty  Transfers                 General transfer comment: did not perform    Balance Overall balance assessment: Needs assistance;History of Falls Sitting-balance support: Bilateral upper extremity supported Sitting balance-Leahy Scale: Poor                                     ADL either performed or assessed with clinical judgement   ADL Overall ADL's : Needs assistance/impaired Eating/Feeding: Set up;Sitting   Grooming: Minimal assistance;Sitting            Upper Body Dressing : Minimal assistance   Lower Body Dressing: Maximal assistance;Sitting/lateral leans                       Vision Patient Visual Report: No change from baseline              Pertinent Vitals/Pain Pain Assessment: 0-10 Pain Score: 3  Pain Location: LE and feet Pain Descriptors / Indicators: Sore;Grimacing;Discomfort     Hand Dominance     Extremity/Trunk Assessment Upper Extremity Assessment Upper Extremity Assessment: Generalized weakness           Communication Communication Communication: No difficulties   Cognition Arousal/Alertness: Awake/alert Behavior During Therapy: WFL for tasks assessed/performed Overall Cognitive Status: Within Functional Limits for tasks assessed                                                Home Living Family/patient expects to be discharged to:: Private residence Living Arrangements: Alone Available Help at Discharge: Personal care attendant;Family(7 days/wk 2 hrs per day) Type of Home: House Home Access: Level entry     Home Layout: One level     Bathroom Shower/Tub: Occupational psychologist: Standard  Home Equipment: [place] - 4 wheels;Cane - single point;Shower seat          Prior Functioning/Environment Level of Independence: Independent with assistive device(s)  Gait / Transfers Assistance Needed: amb with rollator     Comments: aide assistst with meals, laundry, ADLs prn        OT Problem List: Decreased strength;Decreased activity tolerance;Impaired balance (sitting and/or standing);Decreased safety awareness;Decreased knowledge of use of DME or AE;Decreased knowledge of precautions      OT Treatment/Interventions: Self-care/ADL training;Patient/family education;DME and/or AE instruction    OT Goals(Current goals can be found in the care plan section) Acute Rehab OT Goals Patient Stated Goal: be able to go home OT Goal Formulation: With  patient Time For Goal Achievement: 06/13/19  OT Frequency: Min 2X/week    AM-PAC OT "6 Clicks" Daily Activity     Outcome Measure Help from another person eating meals?: A Little Help from another person taking care of personal grooming?: A Little Help from another person toileting, which includes using toliet, bedpan, or urinal?: A Lot Help from another person bathing (including washing, rinsing, drying)?: A Lot Help from another person to put on and taking off regular upper body clothing?: A Little Help from another person to put on and taking off regular lower body clothing?: A Lot 6 Click Score: 15   End of Session Nurse Communication: Mobility status  Activity Tolerance: Patient tolerated treatment well Patient left: in bed;with bed alarm set  OT Visit Diagnosis: Unsteadiness on feet (R26.81);Other abnormalities of gait and mobility (R26.89);Repeated falls (R29.6);Muscle weakness (generalized) (M62.81);History of falling (Z91.81)                Time: 1355-1415 OT Time Calculation (min): 20 min Charges:  OT General Charges $OT Visit: 1 Visit OT Evaluation $OT Eval Moderate Complexity: 1 Mod  Kari Baars, OT Acute Rehabilitation Services [phone] Office- [phone]     [place], Edwena Felty D 06/05/2019, 3:11 PM

## 2019-06-05 NOTE — Progress Notes (Signed)
Clinical/Bedside Swallow Evaluation Patient Details  Name: Kathleen [name] MRN: [id number] Date of Birth: [birth year]  Today's Date: 06/05/2019 Time: SLP Start Time (ACUTE ONLY): 1635 SLP Stop Time (ACUTE ONLY): 1659 SLP Time Calculation (min) (ACUTE ONLY): 24 min  Past Medical History:  Past Medical History:  Diagnosis Date  . Arthritis   . Blood transfusion    no side affects  . CHF (congestive heart failure) ([place])   . Coronary artery disease    Cypher stent to the RCA in 2003  . Diabetes mellitus    Borderline  . GERD (gastroesophageal reflux disease)   . Hypertension   . Shortness of breath   . Sleep apnea    Past Surgical History:  Past Surgical History:  Procedure Laterality Date  . BIOPSY  06/06/2018   Procedure: BIOPSY;  Surgeon: Otis Brace, MD;  Location: [facility];  Service: Gastroenterology;;  . CARDIOVERSION N/A 03/17/2015   Procedure: CARDIOVERSION;  Surgeon: Sueanne Margarita, MD;  Location: [facility];  Service: Cardiovascular;  Laterality: N/A;  . CHOLECYSTECTOMY    . CORONARY ANGIOPLASTY WITH STENT PLACEMENT    . ENTEROSCOPY N/A 06/28/2018   Procedure: ENTEROSCOPY;  Surgeon: Carol Ada, MD;  Location: [facility];  Service: Endoscopy;  Laterality: N/A;  . ENTEROSCOPY N/A 09/12/2018   Procedure: ENTEROSCOPY;  Surgeon: Carol Ada, MD;  Location: [facility];  Service: Endoscopy;  Laterality: N/A;  . ENTEROSCOPY N/A 12/21/2018   Procedure: ENTEROSCOPY;  Surgeon: Carol Ada, MD;  Location: [facility];  Service: Endoscopy;  Laterality: N/A;  . ENTEROSCOPY N/A 02/03/2019   Procedure: ENTEROSCOPY;  Surgeon: Ladene Artist, MD;  Location: [facility];  Service: Endoscopy;  Laterality: N/A;  . ESOPHAGOGASTRODUODENOSCOPY (EGD) WITH PROPOFOL N/A 06/06/2018   Procedure: ESOPHAGOGASTRODUODENOSCOPY (EGD) WITH PROPOFOL;  Surgeon: Otis Brace, MD;  [facility]: [facility];  Service: Gastroenterology;  Laterality: N/A;  . GIVENS CAPSULE STUDY N/A  06/25/2018   Procedure: GIVENS CAPSULE STUDY;  Surgeon: Carol Ada, MD;  [facility];  Service: Endoscopy;  Laterality: N/A;  . HOT HEMOSTASIS N/A 06/06/2018   Procedure: HOT HEMOSTASIS (ARGON PLASMA COAGULATION/BICAP);  Surgeon: Otis Brace, MD;  Location: [facility];  Service: Gastroenterology;  Laterality: N/A;  . HOT HEMOSTASIS N/A 06/28/2018   Procedure: HOT HEMOSTASIS (ARGON PLASMA COAGULATION/BICAP);  Surgeon: Carol Ada, MD;  Location: [facility];  Service: Endoscopy;  Laterality: N/A;  . HOT HEMOSTASIS N/A 12/21/2018   Procedure: HOT HEMOSTASIS (ARGON PLASMA COAGULATION/BICAP);  Surgeon: Carol Ada, MD;  Location: [facility];  Service: Endoscopy;  Laterality: N/A;  . HOT HEMOSTASIS N/A 02/03/2019   Procedure: HOT HEMOSTASIS (ARGON PLASMA COAGULATION/BICAP);  Surgeon: Ladene Artist, MD;  Location: [facility];  Service: Endoscopy;  Laterality: N/A;  . TOTAL KNEE ARTHROPLASTY     HPI:  73 year old with past medical history significant for hypertension, diastolic heart failure, chronic kidney disease a stage IV, coronary artery disease, diabetes, paroxysmal A. fib history of GI bleed who presented emergency department for evaluation of persistent nausea vomiting abdominal pain and diarrhea.  Patient has been having above symptoms for the last 2 weeks prior to admission.  She has been having trouble swallowing both solid and liquid food. Initially plan was for EGD and colonscopy but this is deferred at this time and diet was approved to be advanced.   Assessment / Plan / Recommendation Clinical Impression  Patient present with possible mild oral dysphagia characterized by prolonged mastication/oral manipulation with mild residuals.  Pt was aware of residuals which she was  able to clear with liquids/purees.  NO indications of aspiration with all po observed *milk, applesauce, sandwich, graham cracker.  As she is edentulous and eats only softer foods at home,  recommend advance to dys3/thin.  Will follow up x1 to assure tolerance given prior symptoms.  Educated pt and her sister to recommendations/findings. SLP Visit Diagnosis: Dysphagia, oral phase (R13.11)    Aspiration Risk  Mild aspiration risk    Diet Recommendation Dysphagia 3 (Mech soft);Thin liquid   Liquid Administration via: Cup;Straw Medication Administration: Whole meds with liquid Supervision: Patient able to self feed Compensations: Slow rate;Small sips/bites Postural Changes: Seated upright at 90 degrees;Remain upright for at least 30 minutes after po intake    Other  Recommendations Oral Care Recommendations: Oral care BID   Follow up Recommendations    tbd    Frequency and Duration min 1 x/week  1 week       Prognosis Prognosis for Safe Diet Advancement: Good      Swallow Study   General Date of Onset: 06/05/19 HPI: 73 year old with past medical history significant for hypertension, diastolic heart failure, chronic kidney disease a stage IV, coronary artery disease, diabetes, paroxysmal A. fib history of GI bleed who presented emergency department for evaluation of persistent nausea vomiting abdominal pain and diarrhea.  Patient has been having above symptoms for the last 2 weeks prior to admission.  She has been having trouble swallowing both solid and liquid food. Initially plan was for EGD and colonscopy but this is deferred at this time and diet was approved to be advanced. Type of Study: Bedside Swallow Evaluation Diet Prior to this Study: Thin liquids(full liquids) Temperature Spikes Noted: No Respiratory Status: Nasal cannula History of Recent Intubation: No Behavior/Cognition: Alert;Cooperative;Pleasant mood Oral Cavity Assessment: Dry Oral Care Completed by SLP: No Vision: Functional for self-feeding Self-Feeding Abilities: Able to feed self Patient Positioning: Upright in bed Baseline Vocal Quality: Normal Volitional Cough: Strong Volitional Swallow:  Unable to elicit    Oral/Motor/Sensory Function Overall Oral Motor/Sensory Function: Generalized oral weakness(pt does tend to lean toward the right but no focal CN deficits impacting swallow noted)   Ice Chips Ice chips: Not tested   Thin Liquid Thin Liquid: Within functional limits Presentation: Self Fed;Straw Other Comments: "audible" swallow, no indication of aspiration- did not conduct 3 ounce water test due to pt having nausea/gagging earlier in hospital course    Nectar Thick Nectar Thick Liquid: Not tested   Honey Thick Honey Thick Liquid: Not tested   Puree Puree: Within functional limits Presentation: Self Fed;Spoon   Solid     Solid: Impaired Oral Phase Impairments: Reduced lingual movement/coordination Oral Phase Functional Implications: Prolonged oral transit;Oral residue Other Comments: prolonged mastication but pt aware of mild residuals and able to clear with liquids or purees, she takes very small bites and SLP suspects her wanting to eat "to keep from being hungry" and no enjoyment may be contributing to this behavior      Macario Golds 06/05/2019,5:13 PM   Kathleen [name], [place] [facility] SLP [place] Pager [phone] Office [phone]

## 2019-06-05 NOTE — Progress Notes (Signed)
PROGRESS NOTE    Kathleen [name]  EVO:[id number] DOB: 01/20/1946 DOA: 05/30/2019 PCP: Nolene Ebbs, MD    Brief Narrative: 73 year old with past medical history significant for hypertension, diastolic heart failure, chronic kidney disease a stage IV, coronary artery disease, diabetes, paroxysmal A. fib history of GI bleed who presented emergency department for evaluation of persistent nausea vomiting abdominal pain and diarrhea.  Patient has been having above symptoms for the last 2 weeks prior to admission.  She has been having trouble swallowing both solid and liquid food.  She was evaluated as an outpatient by Dr. Benson Norway plan was to proceed with endoscopy colonoscopy.  Patient was admitted with nausea vomiting abdominal pain, diarrhea.  She was found to have acute pancreatitis, colitis.  AKI on chronic renal disease.  She also presented with acute metabolic encephalopathy which has improved.  Plan for today is to advance diet, change antibiotics to oral.  Patient might be able to be discharged in 24 hours if she continues to be stable..  Assessment & Plan:   Principal Problem:   Dysphagia Active Problems:   Essential hypertension   Diabetes mellitus type 2, controlled (HCC)   HLD (hyperlipidemia)   CKD (chronic kidney disease) stage 4, GFR 15-29 ml/min (HCC)   Chronic diastolic CHF (congestive heart failure) (HCC)   Atrial fibrillation, chronic   GI bleed   Idiopathic acute pancreatitis without infection or necrosis   Diverticulitis of large intestine without perforation or abscess with bleeding   Acute metabolic encephalopathy;  Patient sleepy this am, subsequently wakeup a little more. She is currently protecting her airway. She has some right side upper arm weakness, unclear chronicity, per patient have for a while. Family was not aware.  -She received morphine overnight. Stop Narcotics.  -electrolytes abnormalities improved.  -CT head negative. MRI negative. ABG with only  mild hypoxemia.  -Ammonia level normal. Discussed with Dr Benson Norway ammonia level doesn't necessary correlate with hepatic encephalopathy. She was not able to tolerate lactulose due to lethargy. Lactulose discontinue.  -Improved. Encephalopathy related to acute illness.    A fib RVR; acute on chronic A fib  Not on anticoagulation due to risk for bleeding.  She was transiently on Cardizem gtt.  Now back on her oral metoprolol.   Dysphagia, Nausea, vomiting;  Tolerating clear.  Speech bedside evaluation.    Acute Pancreatitis;  Treated  with IV fluids.  Report improvement of abdominal pain.  Plan to advance diet today.   Mild colitis;  CT showed possible mild colitis.  Continue with Ciprofloxacin and flagyl. Needs 7 days treatment.  Diarrhea resolved.   Acute on Chronic Kidney diseases stage IV; oliguric.  Cr baseline 3.1---3.8 Monitor renal function.  On low rate IV fluids. Nephrology consulted to help with fluids in patient that is oliguric.  Defer to nephrology Lasix.  Received sodium Bicarb gtt. Discontinue to day by nephrology.  Stable.   Hypernatremia; Treated with  IV fluid to D 5. Resolved.   Hypokalemia; resolved.   Metabolic acidosis;  Improved, treated with Bicarb Gtt  Hyperbilirubinemia; Liver enzymes, not significantly elevated.  CT liver without significant abnormality. S/P cholecystectomy.  hepatitis panel negative and trend bili.  GI following.   Pancytopenia;  Might be related to acute illness.  Peripheral smear no significant schistocyte.  thrombocytopenia suspect related to infection. Monitor. Platelet stable today  Improving.   Pressure Injury stage II POA sacrum;  Local care.   Chronic diastolic Heart failure; Appears dehydrated.  Monitor on IV fluids.  Diabetes type 2:; Holding Lantus due to poor oral intake  History of gout continue with allopurinol  Hyperlipidemia hold statins  H/O GI bleed:She has history of angiodysplastic lesions  of the stomach and duodenal bulb.  Continue Protonix   Pressure Injury 06/22/18 Stage II -  Partial thickness loss of dermis presenting as a shallow open ulcer with a red, pink wound bed without slough. (Active)  06/22/18 2030  Location: Sacrum  Location Orientation: Medial  Staging: Stage II -  Partial thickness loss of dermis presenting as a shallow open ulcer with a red, pink wound bed without slough.  Wound Description (Comments):   Present on Admission: Yes     Nutrition Problem: Inadequate oral intake Etiology: inability to eat Estimated body mass index is 35.12 kg/m as calculated from the following:   Height as of this encounter: 5\' 4"  (1.626 m).   Weight as of this encounter: 92.8 kg.   DVT prophylaxis: scd.  Code Status: full code Family Communication: Sister at bedside 6-22 Disposition Plan: hopefully discharge 6-24 if stable.    Consultants:   GI, Dr Benson Norway   Procedures:      Antimicrobials:  none   Subjective: Alert, conversant, abdominal pain improved.   Objective: Vitals:   06/04/19 2113 06/05/19 0415 06/05/19 0808 06/05/19 1154  BP: 102/62 (!) 93/58 93/71 (!) 99/53  Pulse: (!) 106 (!) 110 (!) 120 99  Resp: 20 18  18   Temp: (!) 97.5 F (36.4 C) 97.7 F (36.5 C)  97.6 F (36.4 C)  TempSrc: Oral Oral  Oral  SpO2: 100% 100%  100%  Weight:  92.8 kg    Height:        Intake/Output Summary (Last 24 hours) at 06/05/2019 1522 Last data filed at 06/05/2019 1156 Gross per 24 hour  Intake 340 ml  Output 500 ml  Net -160 ml   Filed Weights   06/03/19 0500 06/04/19 0500 06/05/19 0415  Weight: 88.5 kg 92.3 kg 92.8 kg    Examination:  General exam: Alert, following command.  Respiratory system: CTA Cardiovascular system; S 1, S 2 IRR Gastrointestinal system: BS present, soft, nt Central nervous system: alert and oriented Extremities: Symmetric power.  Skin: no rashes    Data Reviewed: I have personally reviewed following labs and imaging  studies  CBC: Recent Labs  Lab 05/30/19 0417  06/01/19 0406 06/02/19 0737 06/03/19 0423 06/04/19 0418 06/05/19 0528  WBC 5.1   < > 4.0 4.4 4.9 6.8 8.7  NEUTROABS 3.5  --   --   --   --   --   --   HGB 10.9*   < > 10.7* 9.8* 10.0* 10.3* 10.5*  HCT 36.2   < > 34.0* 31.6* 31.3* 32.1* 32.6*  MCV 79.7*   < > 79.4* 79.2* 77.3* 76.2* 76.3*  PLT 139*   < > 86* 70* 70* 71* 80*   < > = values in this interval not displayed.   Basic Metabolic Panel: Recent Labs  Lab 06/01/19 0406 06/02/19 0737 06/03/19 0423 06/04/19 0418 06/05/19 0528  NA 144 139 141 143 143  K 3.3* 3.1* 3.4* 3.6 3.8  CL 109 105 107 109 107  CO2 20* 20* 19* 21* 19*  GLUCOSE 108* 210* 110* 137* 120*  BUN 86* 90* 85* 79* 86*  CREATININE 3.77* 3.69* 3.48* 3.21* 3.36*  CALCIUM 8.5* 8.1* 8.0* 7.3* 7.3*  MG  --  1.9  --   --   --    GFR:  Estimated Creatinine Clearance: 16.2 mL/min (A) (by C-G formula based on SCr of 3.36 mg/dL (H)). Liver Function Tests: Recent Labs  Lab 06/01/19 0406 06/02/19 0737 06/02/19 1522 06/03/19 0423 06/04/19 0418 06/05/19 0528  AST 44* 38  --  32 32 41  ALT 35 34  --  34 38 59*  ALKPHOS 73 63  --  62 60 68  BILITOT 4.4* 4.6* 4.8* 5.1* 5.0* 5.4*  PROT 6.3* 5.8*  --  5.6* 5.5* 5.9*  ALBUMIN 3.1* 2.7*  --  2.6* 2.5* 2.7*   Recent Labs  Lab 05/30/19 0417 05/31/19 1222 06/01/19 0406  LIPASE 127* 579* 175*   Recent Labs  Lab 05/31/19 1207  AMMONIA 35   Coagulation Profile: No results for input(s): INR, PROTIME in the last 168 hours. Cardiac Enzymes: Recent Labs  Lab 05/30/19 0417  TROPONINI 0.06*   BNP (last 3 results) No results for input(s): PROBNP in the last 8760 hours. HbA1C: No results for input(s): HGBA1C in the last 72 hours. CBG: Recent Labs  Lab 06/04/19 2109 06/05/19 0025 06/05/19 0410 06/05/19 0812 06/05/19 1133  GLUCAP 131* 141* 137* 123* 129*   Lipid Profile: No results for input(s): CHOL, HDL, LDLCALC, TRIG, CHOLHDL, LDLDIRECT in the last 72  hours. Thyroid Function Tests: No results for input(s): TSH, T4TOTAL, FREET4, T3FREE, THYROIDAB in the last 72 hours. Anemia Panel: No results for input(s): VITAMINB12, FOLATE, FERRITIN, TIBC, IRON, RETICCTPCT in the last 72 hours. Sepsis Labs: No results for input(s): PROCALCITON, LATICACIDVEN in the last 168 hours.  Recent Results (from the past 240 hour(s))  SARS Coronavirus 2 (CEPHEID - Performed in [place] hospital lab), Hosp Order     Status: None   Collection Time: 05/30/19  6:17 AM   Specimen: Nasopharyngeal Swab  Result Value Ref Range Status   SARS Coronavirus 2 NEGATIVE NEGATIVE Final    Comment: (NOTE) If result is NEGATIVE SARS-CoV-2 target nucleic acids are NOT DETECTED. The SARS-CoV-2 RNA is generally detectable in upper and lower  respiratory specimens during the acute phase of infection. The lowest  concentration of SARS-CoV-2 viral copies this assay can detect is 250  copies / mL. A negative result does not preclude SARS-CoV-2 infection  and should not be used as the sole basis for treatment or other  patient management decisions.  A negative result may occur with  improper specimen collection / handling, submission of specimen other  than nasopharyngeal swab, presence of viral mutation(s) within the  areas targeted by this assay, and inadequate number of viral copies  (<250 copies / mL). A negative result must be combined with clinical  observations, patient history, and epidemiological information. If result is POSITIVE SARS-CoV-2 target nucleic acids are DETECTED. The SARS-CoV-2 RNA is generally detectable in upper and lower  respiratory specimens dur ing the acute phase of infection.  Positive  results are indicative of active infection with SARS-CoV-2.  Clinical  correlation with patient history and other diagnostic information is  necessary to determine patient infection status.  Positive results do  not rule out bacterial infection or co-infection with  other viruses. If result is PRESUMPTIVE POSTIVE SARS-CoV-2 nucleic acids MAY BE PRESENT.   A presumptive positive result was obtained on the submitted specimen  and confirmed on repeat testing.  While 2019 novel coronavirus  (SARS-CoV-2) nucleic acids may be present in the submitted sample  additional confirmatory testing may be necessary for epidemiological  and / or clinical management purposes  to differentiate between  SARS-CoV-2 and  other Sarbecovirus currently known to infect humans.  If clinically indicated additional testing with an alternate test  methodology [phone]) is advised. The SARS-CoV-2 RNA is generally  detectable in upper and lower respiratory sp ecimens during the acute  phase of infection. The expected result is Negative. Fact Sheet for Patients:  StrictlyIdeas.no Fact Sheet for Healthcare Providers: BankingDealers.co.za This test is not yet approved or cleared by the [place] FDA and has been authorized for detection and/or diagnosis of SARS-CoV-2 by FDA under an Emergency Use Authorization (EUA).  This EUA will remain in effect (meaning this test can be used) for the duration of the COVID-19 declaration under Section 564(b)(1) of the Act, 21 U.S.C. section 360bbb-3(b)(1), unless the authorization is terminated or revoked sooner. Performed at [facility], [place] [address], [place], [place] [address]   Urine Culture     Status: None   Collection Time: 05/31/19  2:26 PM   Specimen: Urine, Catheterized  Result Value Ref Range Status   Specimen Description   Final    URINE, CATHETERIZED Performed at [facility] [address] [place], [place] [address]    Special Requests   Final    NONE Performed at [facility], [place] [address] [place], [place] [address]    Culture   Final    NO GROWTH Performed at [facility], [place] [address],  [place], [place] [address]    Report Status 06/01/2019 FINAL  Final  Gastrointestinal Panel by PCR , Stool     Status: None   Collection Time: 06/01/19  1:44 PM   Specimen: Stool  Result Value Ref Range Status   Campylobacter species NOT DETECTED NOT DETECTED Final   Plesimonas shigelloides NOT DETECTED NOT DETECTED Final   Salmonella species NOT DETECTED NOT DETECTED Final   Yersinia enterocolitica NOT DETECTED NOT DETECTED Final   Vibrio species NOT DETECTED NOT DETECTED Final   Vibrio cholerae NOT DETECTED NOT DETECTED Final   Enteroaggregative E coli (EAEC) NOT DETECTED NOT DETECTED Final   Enteropathogenic E coli (EPEC) NOT DETECTED NOT DETECTED Final   Enterotoxigenic E coli (ETEC) NOT DETECTED NOT DETECTED Final   Shiga like toxin producing E coli (STEC) NOT DETECTED NOT DETECTED Final   Shigella/Enteroinvasive E coli (EIEC) NOT DETECTED NOT DETECTED Final   Cryptosporidium NOT DETECTED NOT DETECTED Final   Cyclospora cayetanensis NOT DETECTED NOT DETECTED Final   Entamoeba histolytica NOT DETECTED NOT DETECTED Final   Giardia lamblia NOT DETECTED NOT DETECTED Final   Adenovirus F40/41 NOT DETECTED NOT DETECTED Final   Astrovirus NOT DETECTED NOT DETECTED Final   Norovirus GI/GII NOT DETECTED NOT DETECTED Final   Rotavirus A NOT DETECTED NOT DETECTED Final   Sapovirus (I, II, IV, and V) NOT DETECTED NOT DETECTED Final    Comment: Performed at [facility], [address] [place],  [address]         Radiology Studies: No results found.      Scheduled Meds: . allopurinol  100 mg Oral Daily  . famotidine  20 mg Oral q1800  . insulin aspart  0-9 Units Subcutaneous Q4H  . metoprolol tartrate  25 mg Oral BID  . mometasone-formoterol  2 puff Inhalation BID  . pantoprazole  40 mg Oral BID  . sodium chloride flush  3 mL Intravenous Q12H  . thiamine  100 mg Oral Daily   Continuous Infusions: . sodium chloride    . ciprofloxacin 400 mg (06/04/19  1912)  .  metronidazole 500 mg (06/05/19 0806)     LOS: 5 days    Time spent: 35 minutes.     Elmarie Shiley, MD [facility] Pager [phone]  If 7PM-7AM, please contact night-coverage [email or web address] Password [facility] 06/05/2019, 3:22 PM

## 2019-06-06 DIAGNOSIS — I5032 Chronic diastolic (congestive) heart failure: Secondary | ICD-10-CM

## 2019-06-06 DIAGNOSIS — N184 Chronic kidney disease, stage 4 (severe): Secondary | ICD-10-CM

## 2019-06-06 DIAGNOSIS — I10 Essential (primary) hypertension: Secondary | ICD-10-CM

## 2019-06-06 DIAGNOSIS — I482 Chronic atrial fibrillation, unspecified: Secondary | ICD-10-CM

## 2019-06-06 DIAGNOSIS — E785 Hyperlipidemia, unspecified: Secondary | ICD-10-CM

## 2019-06-06 LAB — GLUCOSE, CAPILLARY
Glucose-Capillary: 114 mg/dL — ABNORMAL HIGH (ref 70–99)
Glucose-Capillary: 123 mg/dL — ABNORMAL HIGH (ref 70–99)
Glucose-Capillary: 127 mg/dL — ABNORMAL HIGH (ref 70–99)
Glucose-Capillary: 130 mg/dL — ABNORMAL HIGH (ref 70–99)
Glucose-Capillary: 137 mg/dL — ABNORMAL HIGH (ref 70–99)
Glucose-Capillary: 141 mg/dL — ABNORMAL HIGH (ref 70–99)
Glucose-Capillary: 151 mg/dL — ABNORMAL HIGH (ref 70–99)

## 2019-06-06 LAB — HEMOGLOBIN A1C
Hgb A1c MFr Bld: 6.3 % — ABNORMAL HIGH (ref 4.8–5.6)
Mean Plasma Glucose: 134.11 mg/dL

## 2019-06-06 MED ORDER — METRONIDAZOLE 500 MG PO TABS
500.0000 mg | ORAL_TABLET | Freq: Three times a day (TID) | ORAL | Status: AC
  Administered 2019-06-06 – 2019-06-07 (×5): 500 mg via ORAL
  Filled 2019-06-06 (×5): qty 1

## 2019-06-06 MED ORDER — METOPROLOL TARTRATE 25 MG PO TABS
25.0000 mg | ORAL_TABLET | Freq: Two times a day (BID) | ORAL | Status: DC
  Administered 2019-06-07 – 2019-06-08 (×3): 25 mg via ORAL
  Filled 2019-06-06 (×4): qty 1

## 2019-06-06 MED ORDER — CIPROFLOXACIN HCL 500 MG PO TABS
500.0000 mg | ORAL_TABLET | Freq: Every day | ORAL | Status: AC
  Administered 2019-06-06 – 2019-06-07 (×2): 500 mg via ORAL
  Filled 2019-06-06 (×2): qty 1

## 2019-06-06 MED ORDER — METOPROLOL TARTRATE 25 MG PO TABS
12.5000 mg | ORAL_TABLET | Freq: Once | ORAL | Status: AC
  Administered 2019-06-06: 12.5 mg via ORAL

## 2019-06-06 NOTE — Progress Notes (Signed)
Nutrition Follow-up  RD working remotely.   DOCUMENTATION CODES:   Obesity unspecified  INTERVENTION:  - will order Ensure Enlive BID, each supplement provides 350 kcal and 20 grams of protein. - will order 30 mL Prostat BID, each supplement provides 100 kcal and 15 grams of protein. - will order daily multivitamin with minerals. - continue to encourage PO intakes.    NUTRITION DIAGNOSIS:   Inadequate oral intake related to acute illness, decreased appetite as evidenced by meal completion < 50%. -revised, ongoing  GOAL:   Patient will meet greater than or equal to 90% of their needs -unmet  MONITOR:   PO intake, Supplement acceptance, Labs, Weight trends, I & O's  ASSESSMENT:   73 year old female with past medical history of arthritis, CHF, CAD, DM, GERD, HTN, and sleep apnea. She presented to the ED with complaints of dysphagia, vomiting, and diarrhea. She reported diarrhea began 1-2 weeks ago and that she lost 10 lb during that time. No associated abdominal pain, although diarrhea sometimes wakes her up at night.  Weight +4.6 kg/10 lb compared to admission weight. Diet advanced from NPO to CLD on 6/21 at 2:00 PM, to [place] on 6/22 at 2:40 PM, and to Dysphagia 3, Carb Modified, thin liquids on 6/23 at 4:45 PM. Per review of RN flow sheet, she consumed 20% of dinner on 6/21, 25% of breakfast and dinner on 6/22, and 25% of all meals on 6/23.   SLP has been working with patient and last saw her yesterday evening. Note indicates that patient eats only softer foods at home d/t being edentulous. Recommendation was made for current diet order.   Per notes: plan yesterday was for advancement of diet and for antibiotics to be changed to oral. MD note yesterday afternoon stated possible d/c in 24 hours if she continued to be stable. Patient with acute pancreatitis--improving, mild colitis, acute on CKD stage 4, hypernatremia and hypokalemia resolved, hyperbilirubinemia--CT liver showed nothing  significant, s/p cholecystectomy, hepatitis panel negative, pancytopenia--improving.    Medications reviewed; 20 mg oral pepcid/day, sliding scale novolog, 100 mg thiamine/day.  Labs reviewed; CBGs: 127, 130, and 127 mg/dl today, BUN: 86 mg/dl, creatinine: 3.36 mg/dl, Ca: 7.3 mg/dl, ALT elevated, GFR: 15 ml/min.       Diet Order:   Diet Order            DIET DYS 3 Room service appropriate? Yes; Fluid consistency: Thin  Diet effective now              EDUCATION NEEDS:   Not appropriate for education at this time  Skin:  Skin Assessment: Reviewed RN Assessment  Last BM:  6/17  Height:   Ht Readings from Last 1 Encounters:  05/30/19 5\' 4"  (1.626 m)    Weight:   Wt Readings from Last 1 Encounters:  06/06/19 91.4 kg    Ideal Body Weight:  54.5 kg  BMI:  Body mass index is 34.59 kg/m.  Estimated Nutritional Needs:   Kcal:  1740-1910 kcal  Protein:  80-90 grams  Fluid:  >/= 2.5 L/day     Jarome Matin, MS, RD, LDN, [facility] Inpatient Clinical Dietitian Pager # [phone] After hours/weekend pager # [phone]

## 2019-06-06 NOTE — Progress Notes (Signed)
PHARMACIST - PHYSICIAN COMMUNICATION DR:   Lonny Prude CONCERNING: Antibiotic IV to Oral Route Change Policy  RECOMMENDATION: This patient is receiving Cipro/flagyl by the intravenous route.  Based on criteria approved by the Pharmacy and Therapeutics Committee, the antibiotic(s) is/are being converted to the equivalent oral dose form(s).   DESCRIPTION: These criteria include:  Patient being treated for a respiratory tract infection, urinary tract infection, cellulitis or clostridium difficile associated diarrhea if on metronidazole  The patient is not neutropenic and does not exhibit a GI malabsorption state  The patient is eating (either orally or via tube) and/or has been taking other orally administered medications for a least 24 hours  The patient is improving clinically and has a Tmax < 100.5  If you have questions about this conversion, please contact the Pharmacy Department  []   [phone] )  [facility] []   [phone] )  [facility] []   [phone] )  [facility] []   [phone] )  [facility] [x]   [phone] )  [facility]

## 2019-06-06 NOTE — Progress Notes (Addendum)
[facility] PROGRESS NOTE  Kathleen [name] HKV:[id number] DOB: June 25, 1946 DOA: 05/30/2019 PCP: Nolene Ebbs, MD  Assessment/Plan: 1. Acute mild descending colon diverticulitis/pancreatitis. Tolerating regular diet. Complete 7 day course of cipro and flagyl, per GI. Medically stable. Unclear etiology of pancreatitis 2. CKD, Stage 4, oliguric. Stable at baseline (3.1-3.8), avoid nephroxtoxins, monitor output and daily BMP. Brief insufficiency related to dehydration from diarrhea/diminished PO intake 3. Urine discoloration. Seems to be dark likely from dehydration, check UA-she is asymptomatic otherwise, continue IVF for additional 24 hours. 4. Acute metabolic encephalopathy, resolved. Suspect related to narcotics earlier in hospital as well as acute illness, and electrolyte imbalances (hypernatremia) given negative CT head/MRI. Was unable to tolerate lactulose for presumed hepatic encephalophaty (based on asterixis, ammonial was wnl) 5. Chronic AF. Back on oral lopressor and rate controlled, briefly needed diltiazem gtt for RVR earlier in hospitalization. No AC due to prior GIB. 6. Dysphagia. Speech evaluated. Tolerated dysphagia 3 diet. No plans for EGD per GI 7. Hypernatremia. Resolved with IVF 8. Metabolic acidosis. In setting of CKD. Resolved with bicarb gtt.  9. COPD. Stable. On home inhalers 10. Pancytopenia 11. Hyperbilirubinemia. Peaked at 5.6. slight elevation of AST at 50 . Unclear etiology. No abdominal pain, hepatitis panel unremarkable, trend CMP 12. Hypertension. Holding bidil since admission given dehydration on presentation 13. Chronic CHF with preserved EF. Normal volume status, close monitoring with daily weights, holding lasix 14. Vitamin B12 deficiency. S/p IV repletion on oral replacement currently.  15. Type 2 DM. Check A1c ( last 6% in 2019). CBGs in 140s-150s with SSI 16. Hyperlipidemia. Held pravastatin during slight elevation of LFTs 17. History of GI bleed (  angiodysplasia). Home protonix, hgb stable, close monitoring 18. Gout, stable. Home allopurinol 19. Stage 2 Pressure injury to sacrum, Present on admission. Local care.  20. Physical Deconditioning. PT recs SNF. Unable to even transfer to bed to chair without max assistance ( different from baseline) Family and patient agreeable. S/W consulted  Code Status: FULL  Family Communication: sister Jewel updated at bedside, called daughter Shella Spearing [phone] Disposition Plan: Medically stable, amenable to go to SNF now   Consultants:  GI  Procedures:  none  Antibiotics:  Ciprofloxacin, Flagyl (indicate start date, and stop date if known)  HPI/Subjective:  Kathleen [name] is a 73 y.o. year old female with medical history significant for hypertension, diastolic heart failure, chronic kidney disease a stage IV, coronary artery disease, diabetes, paroxysmal A. fib history of GI bleed who presented on 05/30/2019 with persistent nausea vomiting abdominal pain and diarrhea x 2 weeks as well as dysphagia to solids and liquids and was found to have diverticulitis, mild pancreatitis, AKI on CKD and acute metabolic encephlopathy.  Doing well this am Tolerating diet No abdominal pain  Objective: Vitals:   06/06/19 0826 06/06/19 1537  BP:  105/68  Pulse:  79  Resp:    Temp:    SpO2: 97% 99%    Intake/Output Summary (Last 24 hours) at 06/06/2019 1757 Last data filed at 06/06/2019 1532 Gross per 24 hour  Intake 360 ml  Output -  Net 360 ml   Filed Weights   06/04/19 0500 06/05/19 0415 06/06/19 0500  Weight: 92.3 kg 92.8 kg 91.4 kg    Exam:   General:  Obese female, no distress  Cardiovascular: IRR, no appreciable murmurs or edema  Respiratory: normal respiratory effort  Abdomen: soft, non-distended, non-tender  Musculoskeletal: normal ROM   Skin dry, intact  Neurologic alert, oriented to person,  place, context  Data Reviewed: Basic Metabolic Panel: Recent Labs  Lab  06/01/19 0406 06/02/19 0737 06/03/19 0423 06/04/19 0418 06/05/19 0528  NA 144 139 141 143 143  K 3.3* 3.1* 3.4* 3.6 3.8  CL 109 105 107 109 107  CO2 20* 20* 19* 21* 19*  GLUCOSE 108* 210* 110* 137* 120*  BUN 86* 90* 85* 79* 86*  CREATININE 3.77* 3.69* 3.48* 3.21* 3.36*  CALCIUM 8.5* 8.1* 8.0* 7.3* 7.3*  MG  --  1.9  --   --   --    Liver Function Tests: Recent Labs  Lab 06/01/19 0406 06/02/19 0737 06/02/19 1522 06/03/19 0423 06/04/19 0418 06/05/19 0528  AST 44* 38  --  32 32 41  ALT 35 34  --  34 38 59*  ALKPHOS 73 63  --  62 60 68  BILITOT 4.4* 4.6* 4.8* 5.1* 5.0* 5.4*  PROT 6.3* 5.8*  --  5.6* 5.5* 5.9*  ALBUMIN 3.1* 2.7*  --  2.6* 2.5* 2.7*   Recent Labs  Lab 05/31/19 1222 06/01/19 0406  LIPASE 579* 175*   Recent Labs  Lab 05/31/19 1207  AMMONIA 35   CBC: Recent Labs  Lab 06/01/19 0406 06/02/19 0737 06/03/19 0423 06/04/19 0418 06/05/19 0528  WBC 4.0 4.4 4.9 6.8 8.7  HGB 10.7* 9.8* 10.0* 10.3* 10.5*  HCT 34.0* 31.6* 31.3* 32.1* 32.6*  MCV 79.4* 79.2* 77.3* 76.2* 76.3*  PLT 86* 70* 70* 71* 80*   Cardiac Enzymes: No results for input(s): CKTOTAL, CKMB, CKMBINDEX, TROPONINI in the last 168 hours. BNP (last 3 results) Recent Labs    12/19/18 2020 05/30/19 0417  BNP 863.6* 1,478.3*    ProBNP (last 3 results) No results for input(s): PROBNP in the last 8760 hours.  CBG: Recent Labs  Lab 06/06/19 0027 06/06/19 0400 06/06/19 0840 06/06/19 1232 06/06/19 1733  GLUCAP 137* 130* 127* 151* 141*    Recent Results (from the past 240 hour(s))  SARS Coronavirus 2 (CEPHEID - Performed in [place] hospital lab), Hosp Order     Status: None   Collection Time: 05/30/19  6:17 AM   Specimen: Nasopharyngeal Swab  Result Value Ref Range Status   SARS Coronavirus 2 NEGATIVE NEGATIVE Final    Comment: (NOTE) If result is NEGATIVE SARS-CoV-2 target nucleic acids are NOT DETECTED. The SARS-CoV-2 RNA is generally detectable in upper and lower   respiratory specimens during the acute phase of infection. The lowest  concentration of SARS-CoV-2 viral copies this assay can detect is 250  copies / mL. A negative result does not preclude SARS-CoV-2 infection  and should not be used as the sole basis for treatment or other  patient management decisions.  A negative result may occur with  improper specimen collection / handling, submission of specimen other  than nasopharyngeal swab, presence of viral mutation(s) within the  areas targeted by this assay, and inadequate number of viral copies  (<250 copies / mL). A negative result must be combined with clinical  observations, patient history, and epidemiological information. If result is POSITIVE SARS-CoV-2 target nucleic acids are DETECTED. The SARS-CoV-2 RNA is generally detectable in upper and lower  respiratory specimens dur ing the acute phase of infection.  Positive  results are indicative of active infection with SARS-CoV-2.  Clinical  correlation with patient history and other diagnostic information is  necessary to determine patient infection status.  Positive results do  not rule out bacterial infection or co-infection with other viruses. If result is PRESUMPTIVE POSTIVE  SARS-CoV-2 nucleic acids MAY BE PRESENT.   A presumptive positive result was obtained on the submitted specimen  and confirmed on repeat testing.  While 2019 novel coronavirus  (SARS-CoV-2) nucleic acids may be present in the submitted sample  additional confirmatory testing may be necessary for epidemiological  and / or clinical management purposes  to differentiate between  SARS-CoV-2 and other Sarbecovirus currently known to infect humans.  If clinically indicated additional testing with an alternate test  methodology [phone]) is advised. The SARS-CoV-2 RNA is generally  detectable in upper and lower respiratory sp ecimens during the acute  phase of infection. The expected result is Negative. Fact  Sheet for Patients:  StrictlyIdeas.no Fact Sheet for Healthcare Providers: BankingDealers.co.za This test is not yet approved or cleared by the [place] FDA and has been authorized for detection and/or diagnosis of SARS-CoV-2 by FDA under an Emergency Use Authorization (EUA).  This EUA will remain in effect (meaning this test can be used) for the duration of the COVID-19 declaration under Section 564(b)(1) of the Act, 21 U.S.C. section 360bbb-3(b)(1), unless the authorization is terminated or revoked sooner. Performed at [facility], [place] [address] [place], [place] [address]   Urine Culture     Status: None   Collection Time: 05/31/19  2:26 PM   Specimen: Urine, Catheterized  Result Value Ref Range Status   Specimen Description   Final    URINE, CATHETERIZED Performed at [place] [address], [place], [place] [address]    Special Requests   Final    NONE Performed at [facility], [place] [address], [place], [place] [address]    Culture   Final    NO GROWTH Performed at [facility], [place] [address] [place], [place] [address]    Report Status 06/01/2019 FINAL  Final  Gastrointestinal Panel by PCR , Stool     Status: None   Collection Time: 06/01/19  1:44 PM   Specimen: Stool  Result Value Ref Range Status   Campylobacter species NOT DETECTED NOT DETECTED Final   Plesimonas shigelloides NOT DETECTED NOT DETECTED Final   Salmonella species NOT DETECTED NOT DETECTED Final   Yersinia enterocolitica NOT DETECTED NOT DETECTED Final   Vibrio species NOT DETECTED NOT DETECTED Final   Vibrio cholerae NOT DETECTED NOT DETECTED Final   Enteroaggregative E coli (EAEC) NOT DETECTED NOT DETECTED Final   Enteropathogenic E coli (EPEC) NOT DETECTED NOT DETECTED Final   Enterotoxigenic E coli (ETEC) NOT DETECTED NOT DETECTED Final   Shiga like toxin producing E coli  (STEC) NOT DETECTED NOT DETECTED Final   Shigella/Enteroinvasive E coli (EIEC) NOT DETECTED NOT DETECTED Final   Cryptosporidium NOT DETECTED NOT DETECTED Final   Cyclospora cayetanensis NOT DETECTED NOT DETECTED Final   Entamoeba histolytica NOT DETECTED NOT DETECTED Final   Giardia lamblia NOT DETECTED NOT DETECTED Final   Adenovirus F40/41 NOT DETECTED NOT DETECTED Final   Astrovirus NOT DETECTED NOT DETECTED Final   Norovirus GI/GII NOT DETECTED NOT DETECTED Final   Rotavirus A NOT DETECTED NOT DETECTED Final   Sapovirus (I, II, IV, and V) NOT DETECTED NOT DETECTED Final    Comment: Performed at [facility], [address] [place], [place] [address]     Studies: No results found.  Scheduled Meds: . allopurinol  100 mg Oral Daily  . ciprofloxacin  500 mg Oral q1800  . famotidine  20 mg Oral q1800  . insulin aspart  0-9 Units Subcutaneous Q4H  . metoprolol tartrate  25 mg Oral BID  . metroNIDAZOLE  500 mg Oral Q8H  . mometasone-formoterol  2 puff Inhalation BID  . pantoprazole  40 mg Oral BID  . sodium chloride flush  3 mL Intravenous Q12H  . thiamine  100 mg Oral Daily   Continuous Infusions: . sodium chloride      Principal Problem:   Dysphagia Active Problems:   Essential hypertension   Diabetes mellitus type 2, controlled (HCC)   HLD (hyperlipidemia)   CKD (chronic kidney disease) stage 4, GFR 15-29 ml/min (HCC)   Chronic diastolic CHF (congestive heart failure) (HCC)   Atrial fibrillation, chronic   GI bleed   Idiopathic acute pancreatitis without infection or necrosis   Diverticulitis of large intestine without perforation or abscess with bleeding      Desiree Hane  [facility]

## 2019-06-07 DIAGNOSIS — E1122 Type 2 diabetes mellitus with diabetic chronic kidney disease: Secondary | ICD-10-CM

## 2019-06-07 DIAGNOSIS — Z794 Long term (current) use of insulin: Secondary | ICD-10-CM

## 2019-06-07 LAB — COMPREHENSIVE METABOLIC PANEL
ALT: 83 U/L — ABNORMAL HIGH (ref 0–44)
AST: 34 U/L (ref 15–41)
Albumin: 2.6 g/dL — ABNORMAL LOW (ref 3.5–5.0)
Alkaline Phosphatase: 69 U/L (ref 38–126)
Anion gap: 14 (ref 5–15)
BUN: 93 mg/dL — ABNORMAL HIGH (ref 8–23)
CO2: 20 mmol/L — ABNORMAL LOW (ref 22–32)
Calcium: 9.1 mg/dL (ref 8.9–10.3)
Chloride: 108 mmol/L (ref 98–111)
Creatinine, Ser: 3.46 mg/dL — ABNORMAL HIGH (ref 0.44–1.00)
GFR calc Af Amer: 14 mL/min — ABNORMAL LOW (ref >60)
GFR calc non Af Amer: 12 mL/min — ABNORMAL LOW (ref >60)
Glucose, Bld: 123 mg/dL — ABNORMAL HIGH (ref 70–99)
Potassium: 4.4 mmol/L (ref 3.5–5.1)
Sodium: 142 mmol/L (ref 135–145)
Total Bilirubin: 4.5 mg/dL — ABNORMAL HIGH (ref 0.3–1.2)
Total Protein: 5.5 g/dL — ABNORMAL LOW (ref 6.5–8.1)

## 2019-06-07 LAB — URINALYSIS, ROUTINE W REFLEX MICROSCOPIC
Bilirubin Urine: NEGATIVE
Glucose, UA: NEGATIVE mg/dL
Ketones, ur: NEGATIVE mg/dL
Leukocytes,Ua: NEGATIVE
Nitrite: NEGATIVE
Protein, ur: NEGATIVE mg/dL
Specific Gravity, Urine: 1.014 (ref 1.005–1.030)
pH: 5 (ref 5.0–8.0)

## 2019-06-07 LAB — CBC
HCT: 32.5 % — ABNORMAL LOW (ref 36.0–46.0)
Hemoglobin: 10.1 g/dL — ABNORMAL LOW (ref 12.0–15.0)
MCH: 24.5 pg — ABNORMAL LOW (ref 26.0–34.0)
MCHC: 31.1 g/dL (ref 30.0–36.0)
MCV: 78.9 fL — ABNORMAL LOW (ref 80.0–100.0)
Platelets: 89 10*3/uL — ABNORMAL LOW (ref 150–400)
RBC: 4.12 MIL/uL (ref 3.87–5.11)
RDW: 26.7 % — ABNORMAL HIGH (ref 11.5–15.5)
WBC: 14.4 10*3/uL — ABNORMAL HIGH (ref 4.0–10.5)
nRBC: 0.8 % — ABNORMAL HIGH (ref 0.0–0.2)

## 2019-06-07 LAB — GLUCOSE, CAPILLARY
Glucose-Capillary: 108 mg/dL — ABNORMAL HIGH (ref 70–99)
Glucose-Capillary: 118 mg/dL — ABNORMAL HIGH (ref 70–99)
Glucose-Capillary: 150 mg/dL — ABNORMAL HIGH (ref 70–99)
Glucose-Capillary: 119 mg/dL — ABNORMAL HIGH (ref 70–99)
Glucose-Capillary: 152 mg/dL — ABNORMAL HIGH (ref 70–99)

## 2019-06-07 MED ORDER — METOPROLOL TARTRATE 25 MG PO TABS
12.5000 mg | ORAL_TABLET | Freq: Once | ORAL | Status: AC
  Administered 2019-06-07: 12.5 mg via ORAL

## 2019-06-07 MED ORDER — ACETAMINOPHEN 325 MG PO TABS
650.0000 mg | ORAL_TABLET | Freq: Four times a day (QID) | ORAL | Status: DC | PRN
  Administered 2019-06-07 (×2): 650 mg via ORAL
  Filled 2019-06-07 (×3): qty 2

## 2019-06-07 MED ORDER — SODIUM CHLORIDE 0.9 % IV SOLN
INTRAVENOUS | Status: AC
  Administered 2019-06-07 – 2019-06-08 (×2): via INTRAVENOUS

## 2019-06-07 NOTE — Progress Notes (Signed)
Physical Therapy Treatment Patient Details Name: Kathleen [name] MRN: [id number] DOB: 11/03/1946 Today's Date: 06/07/2019    History of Present Illness 73 y.o. female with medical history significant of hypertension, diastolic congestive heart failure, CKD stage IV, coronary artery disease, diabetes mellitus, paroxysmal A. fib, GI bleed who was brought to the emergency department from home for the evaluation of persistent nausea, vomiting, abdominal pain and diarrhea. CT abdomen/pelvis shows mild acute pancreatitis    PT Comments    Pt motivated to get to recliner today, but reporting continued posterior LE pain. Pt requires mod-max assist +2 for bed mobility and squat pivot to recliner, pt still unable to come to standing with PT. Lift pad placed under pt. Pt tolerated LE strengthening exercises well, requires verbal and tactile cuing to perform. PT to continue to follow acutely.   BP and HR during sitting EOB with reports of dizziness: 88/62 (72), 101 bpm (dizziness improved with time)   Follow Up Recommendations  SNF;Supervision/Assistance - 24 hour     Equipment Recommendations  None recommended by PT    Recommendations for Other Services       Precautions / Restrictions Precautions Precautions: Fall Restrictions Weight Bearing Restrictions: No    Mobility  Bed Mobility Overal bed mobility: Needs Assistance Bed Mobility: Supine to Sit     Supine to sit: Mod assist;+2 for physical assistance;+2 for safety/equipment     General bed mobility comments: Mod assist for LE management, trunk elevation, and scooting to EOB with use of bed pad. Pt able to take small scoots forward, unable to come to feet resting on ground without assist.  Transfers Overall transfer level: Needs assistance Equipment used: 2 person hand held assist Transfers: Squat Pivot Transfers     Squat pivot transfers: Max assist;+2 physical assistance;+2 safety/equipment     General transfer  comment: Max assist +2 for lateral scoots to recliner with use of bed pad, elevation of body off bed and into recliner with use of bed pads. Pt unable to come to standing with use of stedy, attempt x2.  Ambulation/Gait                 Stairs             Wheelchair Mobility    Modified Rankin (Stroke Patients Only)       Balance Overall balance assessment: Needs assistance Sitting-balance support: Feet supported;No upper extremity supported Sitting balance-Leahy Scale: Fair       Standing balance-Leahy Scale: Zero Standing balance comment: unable to come to standing, required heavy max assist for squat pivot to recliner at bedside                            Cognition Arousal/Alertness: Awake/alert Behavior During Therapy: WFL for tasks assessed/performed Overall Cognitive Status: Within Functional Limits for tasks assessed                                        Exercises General Exercises - Lower Extremity Ankle Circles/Pumps: AROM;Both;20 reps;Supine Quad Sets: AROM;Both;Supine;5 reps Long Arc Quad: AAROM;Both;10 reps;Seated Hip ABduction/ADduction: AAROM;Both;10 reps;Seated Hip Flexion/Marching: AAROM;Both;10 reps;Seated    General Comments        Pertinent Vitals/Pain Pain Assessment: Faces Faces Pain Scale: Hurts even more Pain Location: LEs and feet Pain Descriptors / Indicators: Sore;Grimacing;Discomfort Pain Intervention(s): Limited activity within patient's tolerance;Monitored  during session;Repositioned    Home Living                      Prior Function            PT Goals (current goals can now be found in the care plan section) Acute Rehab PT Goals Patient Stated Goal: be able to go home PT Goal Formulation: With patient Time For Goal Achievement: 06/13/19 Potential to Achieve Goals: Good Progress towards PT goals: Progressing toward goals    Frequency    Min 2X/week      PT Plan  Current plan remains appropriate    Co-evaluation              AM-PAC PT "6 Clicks" Mobility   Outcome Measure  Help needed turning from your back to your side while in a flat bed without using bedrails?: A Lot Help needed moving from lying on your back to sitting on the side of a flat bed without using bedrails?: A Lot Help needed moving to and from a bed to a chair (including a wheelchair)?: Total Help needed standing up from a chair using your arms (e.g., wheelchair or bedside chair)?: Total Help needed to walk in hospital room?: Total Help needed climbing 3-5 steps with a railing? : Total 6 Click Score: 8    End of Session Equipment Utilized During Treatment: Gait belt Activity Tolerance: Patient limited by fatigue;Patient limited by pain Patient left: with call bell/phone within reach;with family/visitor present;in chair;with chair alarm set Nurse Communication: Mobility status;Need for lift equipment(maximove lift pad placed) PT Visit Diagnosis: Unsteadiness on feet (R26.81);Other abnormalities of gait and mobility (R26.89);Muscle weakness (generalized) (M62.81)     Time: 4917-9150 PT Time Calculation (min) (ACUTE ONLY): 24 min  Charges:  $Therapeutic Exercise: 8-22 mins $Therapeutic Activity: 8-22 mins                     Tiarra Anastacio Conception Chancy, PT Acute Rehabilitation Services Pager [phone]  Office [phone]   Shabree Tebbetts D Elonda Husky 06/07/2019, 3:55 PM

## 2019-06-07 NOTE — Progress Notes (Signed)
[facility] PROGRESS NOTE  Kathleen [name] PIR:[id number] DOB: 10/07/1946 DOA: 05/30/2019 PCP: Nolene Ebbs, MD  Assessment/Plan: 1. Acute mild descending colon diverticulitis/pancreatitis. Tolerating regular diet. Complete 7 day course of cipro and flagyl, end date of 6/26 per GI. Medically stable. Unclear etiology of pancreatitis 2. CKD, Stage 4, oliguric. Stable at baseline (3.1-3.8), avoid nephroxtoxins, monitor output and daily BMP. Brief insufficiency related to dehydration from diarrhea/diminished PO intake 3. Urine discoloration. Seems to be dark likely from dehydration, UA shows many bacteria but no leukocytes and asymptomatic.  continue IVF for additional 24 hours. 4. Leukocytosis. Afebrile,mildly elevated, doubt infection, UA with bacteria but asymptomatic. Will repeat cbc 5. Orthostatic hypotension? She got dizzy while working with PT and BP of 88/62. Start timed IVF, repeat orthostatics in am 6. Slurred speech. Sister reports it has slowly improved over the last few days. No other deficits. CT head on 6/18 neg. Will monitor if worsens consider MRI. Could be related to edentulous 7. Acute metabolic encephalopathy, resolved. Suspect related to narcotics earlier in hospital as well as acute illness, and electrolyte imbalances (hypernatremia) given negative CT head/MRI. Was unable to tolerate lactulose for presumed hepatic encephalophaty (based on asterixis, ammonial was wnl) 8. Chronic AF. Back on oral lopressor and rate controlled(,110), briefly needed diltiazem gtt for RVR earlier in hospitalization. No AC due to prior GIB. Check ekg 9. Dysphagia. Speech evaluated. Tolerated dysphagia 3 diet. No plans for EGD per GI 10. Hypernatremia. Resolved with IVF 11. Metabolic acidosis. In setting of CKD. Resolved with bicarb gtt.  12. COPD. Stable. On home inhalers 13. Chronic Thrombocytopenia. Stable(80ss-90s) no bleeding, daily CBC 14. Hyperbilirubinemia. Peaked at 5.6--4.5. slight  elevation of AST at 50 . Unclear etiology. No abdominal pain, hepatitis panel unremarkable, has history of mild pulmonary htn, will repeat TTE here, ? Consider abd ultrasound? trend CMP 15. Hypertension. Holding bidil since admission given dehydration on presentation 16. Chronic CHF with preserved EF. Normal volume status, close monitoring with daily weights, holding lasix 17. Vitamin B12 deficiency. S/p IV repletion on oral replacement currently.  18. Type 2 DM. Check A1c ( last 6% in 2019). CBGs in 140s-150s with SSI 19. Hyperlipidemia. Held pravastatin during slight elevation of LFTs 20. History of GI bleed ( angiodysplasia). Home protonix, hgb stable, close monitoring 21. Gout, stable. Home allopurinol 22. Stage 2 Pressure injury to sacrum, Present on admission. Local care.  23. Physical Deconditioning. PT recs SNF. Unable to even transfer to bed to chair without max assistance ( different from baseline) Family and patient agreeable. S/W consulted  Code Status: FULL  Family Communication: sister Kathleen [name] updated at bedside, called daughter Kathleen [name] [phone] Disposition Plan: repeat orthostatics, cbc, TTE for pulm htn, amenable to go to SNF now once medically stable   Consultants:  GI  Procedures:  none  Antibiotics:  Ciprofloxacin, Flagyl (indicate start date, and stop date if known)  HPI/Subjective:  Kathleen [name] is a 73 y.o. year old female with medical history significant for hypertension, diastolic heart failure, chronic kidney disease a stage IV, coronary artery disease, diabetes, paroxysmal A. fib history of GI bleed who presented on 05/30/2019 with persistent nausea vomiting abdominal pain and diarrhea x 2 weeks as well as dysphagia to solids and liquids and was found to have diverticulitis, mild pancreatitis, AKI on CKD and acute metabolic encephlopathy.  No abdominal pain "today is a good day"  Objective: Vitals:   06/07/19 0817 06/07/19 1350  BP:  109/70   Pulse:  89  Resp:  18  Temp:  98 F (36.7 C)  SpO2: 100% 95%    Intake/Output Summary (Last 24 hours) at 06/07/2019 1747 Last data filed at 06/07/2019 1200 Gross per 24 hour  Intake 730 ml  Output -  Net 730 ml   Filed Weights   06/05/19 0415 06/06/19 0500 06/07/19 0500  Weight: 92.8 kg 91.4 kg 98.1 kg    Exam:   General:  Obese female, no distress  Cardiovascular: IRR, no appreciable murmurs or edema  Respiratory: normal respiratory effort on room air  Abdomen: soft, non-distended, non-tender  Musculoskeletal: normal ROM   Skin dry, intact  Neurologic alert, oriented to person, place, context, ? Slurred speech(edentulous)  Data Reviewed: Basic Metabolic Panel: Recent Labs  Lab 06/02/19 0737 06/03/19 0423 06/04/19 0418 06/05/19 0528 06/07/19 0412  NA 139 141 143 143 142  K 3.1* 3.4* 3.6 3.8 4.4  CL 105 107 109 107 108  CO2 20* 19* 21* 19* 20*  GLUCOSE 210* 110* 137* 120* 123*  BUN 90* 85* 79* 86* 93*  CREATININE 3.69* 3.48* 3.21* 3.36* 3.46*  CALCIUM 8.1* 8.0* 7.3* 7.3* 9.1  MG 1.9  --   --   --   --    Liver Function Tests: Recent Labs  Lab 06/02/19 0737 06/02/19 1522 06/03/19 0423 06/04/19 0418 06/05/19 0528 06/07/19 0412  AST 38  --  32 32 41 34  ALT 34  --  34 38 59* 83*  ALKPHOS 63  --  62 60 68 69  BILITOT 4.6* 4.8* 5.1* 5.0* 5.4* 4.5*  PROT 5.8*  --  5.6* 5.5* 5.9* 5.5*  ALBUMIN 2.7*  --  2.6* 2.5* 2.7* 2.6*   Recent Labs  Lab 06/01/19 0406  LIPASE 175*   No results for input(s): AMMONIA in the last 168 hours. CBC: Recent Labs  Lab 06/02/19 0737 06/03/19 0423 06/04/19 0418 06/05/19 0528 06/07/19 0412  WBC 4.4 4.9 6.8 8.7 14.4*  HGB 9.8* 10.0* 10.3* 10.5* 10.1*  HCT 31.6* 31.3* 32.1* 32.6* 32.5*  MCV 79.2* 77.3* 76.2* 76.3* 78.9*  PLT 70* 70* 71* 80* 89*   Cardiac Enzymes: No results for input(s): CKTOTAL, CKMB, CKMBINDEX, TROPONINI in the last 168 hours. BNP (last 3 results) Recent Labs    12/19/18 2020  05/30/19 0417  BNP 863.6* 1,478.3*    ProBNP (last 3 results) No results for input(s): PROBNP in the last 8760 hours.  CBG: Recent Labs  Lab 06/06/19 2342 06/07/19 0453 06/07/19 0841 06/07/19 1150 06/07/19 1637  GLUCAP 123* 108* 118* 150* 119*    Recent Results (from the past 240 hour(s))  SARS Coronavirus 2 (CEPHEID - Performed in [facility] hospital lab), Hosp Order     Status: None   Collection Time: 05/30/19  6:17 AM   Specimen: Nasopharyngeal Swab  Result Value Ref Range Status   SARS Coronavirus 2 NEGATIVE NEGATIVE Final    Comment: (NOTE) If result is NEGATIVE SARS-CoV-2 target nucleic acids are NOT DETECTED. The SARS-CoV-2 RNA is generally detectable in upper and lower  respiratory specimens during the acute phase of infection. The lowest  concentration of SARS-CoV-2 viral copies this assay can detect is 250  copies / mL. A negative result does not preclude SARS-CoV-2 infection  and should not be used as the sole basis for treatment or other  patient management decisions.  A negative result may occur with  improper specimen collection / handling, submission of specimen other  than nasopharyngeal swab, presence of viral mutation(s) within the  areas  targeted by this assay, and inadequate number of viral copies  (<250 copies / mL). A negative result must be combined with clinical  observations, patient history, and epidemiological information. If result is POSITIVE SARS-CoV-2 target nucleic acids are DETECTED. The SARS-CoV-2 RNA is generally detectable in upper and lower  respiratory specimens dur ing the acute phase of infection.  Positive  results are indicative of active infection with SARS-CoV-2.  Clinical  correlation with patient history and other diagnostic information is  necessary to determine patient infection status.  Positive results do  not rule out bacterial infection or co-infection with other viruses. If result is PRESUMPTIVE POSTIVE SARS-CoV-2  nucleic acids MAY BE PRESENT.   A presumptive positive result was obtained on the submitted specimen  and confirmed on repeat testing.  While 2019 novel coronavirus  (SARS-CoV-2) nucleic acids may be present in the submitted sample  additional confirmatory testing may be necessary for epidemiological  and / or clinical management purposes  to differentiate between  SARS-CoV-2 and other Sarbecovirus currently known to infect humans.  If clinically indicated additional testing with an alternate test  methodology [phone]) is advised. The SARS-CoV-2 RNA is generally  detectable in upper and lower respiratory sp ecimens during the acute  phase of infection. The expected result is Negative. Fact Sheet for Patients:  StrictlyIdeas.no Fact Sheet for Healthcare Providers: BankingDealers.co.za This test is not yet approved or cleared by the [place] FDA and has been authorized for detection and/or diagnosis of SARS-CoV-2 by FDA under an Emergency Use Authorization (EUA).  This EUA will remain in effect (meaning this test can be used) for the duration of the COVID-19 declaration under Section 564(b)(1) of the Act, 21 U.S.C. section 360bbb-3(b)(1), unless the authorization is terminated or revoked sooner. Performed at [facility], [place] [address], [place], [place] [address]   Urine Culture     Status: None   Collection Time: 05/31/19  2:26 PM   Specimen: Urine, Catheterized  Result Value Ref Range Status   Specimen Description   Final    URINE, CATHETERIZED Performed at [facility] [address], [place], [place] [address]    Special Requests   Final    NONE Performed at [facility], [place] [address] [place], [place] [address]    Culture   Final    NO GROWTH Performed at [facility], [place] [address], [place], [place] [address]    Report Status 06/01/2019 FINAL  Final   Gastrointestinal Panel by PCR , Stool     Status: None   Collection Time: 06/01/19  1:44 PM   Specimen: Stool  Result Value Ref Range Status   Campylobacter species NOT DETECTED NOT DETECTED Final   Plesimonas shigelloides NOT DETECTED NOT DETECTED Final   Salmonella species NOT DETECTED NOT DETECTED Final   Yersinia enterocolitica NOT DETECTED NOT DETECTED Final   Vibrio species NOT DETECTED NOT DETECTED Final   Vibrio cholerae NOT DETECTED NOT DETECTED Final   Enteroaggregative E coli (EAEC) NOT DETECTED NOT DETECTED Final   Enteropathogenic E coli (EPEC) NOT DETECTED NOT DETECTED Final   Enterotoxigenic E coli (ETEC) NOT DETECTED NOT DETECTED Final   Shiga like toxin producing E coli (STEC) NOT DETECTED NOT DETECTED Final   Shigella/Enteroinvasive E coli (EIEC) NOT DETECTED NOT DETECTED Final   Cryptosporidium NOT DETECTED NOT DETECTED Final   Cyclospora cayetanensis NOT DETECTED NOT DETECTED Final   Entamoeba histolytica NOT DETECTED NOT DETECTED Final  Giardia lamblia NOT DETECTED NOT DETECTED Final   Adenovirus F40/41 NOT DETECTED NOT DETECTED Final   Astrovirus NOT DETECTED NOT DETECTED Final   Norovirus GI/GII NOT DETECTED NOT DETECTED Final   Rotavirus A NOT DETECTED NOT DETECTED Final   Sapovirus (I, II, IV, and V) NOT DETECTED NOT DETECTED Final    Comment: Performed at [facility], [address], [place], [place] [address]     Studies: No results found.  Scheduled Meds: . allopurinol  100 mg Oral Daily  . ciprofloxacin  500 mg Oral q1800  . famotidine  20 mg Oral q1800  . insulin aspart  0-9 Units Subcutaneous Q4H  . metoprolol tartrate  25 mg Oral BID  . metroNIDAZOLE  500 mg Oral Q8H  . mometasone-formoterol  2 puff Inhalation BID  . pantoprazole  40 mg Oral BID  . sodium chloride flush  3 mL Intravenous Q12H  . thiamine  100 mg Oral Daily   Continuous Infusions: . sodium chloride      Principal Problem:   Dysphagia Active Problems:    Essential hypertension   Diabetes mellitus type 2, controlled (HCC)   HLD (hyperlipidemia)   CKD (chronic kidney disease) stage 4, GFR 15-29 ml/min (HCC)   Chronic diastolic CHF (congestive heart failure) (HCC)   Atrial fibrillation, chronic   GI bleed   Idiopathic acute pancreatitis without infection or necrosis   Diverticulitis of large intestine without perforation or abscess with bleeding      Desiree Hane  [facility]

## 2019-06-08 ENCOUNTER — Inpatient Hospital Stay (HOSPITAL_COMMUNITY): Payer: Medicare Other

## 2019-06-08 ENCOUNTER — Other Ambulatory Visit (HOSPITAL_COMMUNITY): Payer: Medicare Other

## 2019-06-08 DIAGNOSIS — I4891 Unspecified atrial fibrillation: Secondary | ICD-10-CM | POA: Clinically undetermined

## 2019-06-08 DIAGNOSIS — D72829 Elevated white blood cell count, unspecified: Secondary | ICD-10-CM

## 2019-06-08 DIAGNOSIS — R945 Abnormal results of liver function studies: Secondary | ICD-10-CM

## 2019-06-08 DIAGNOSIS — I959 Hypotension, unspecified: Secondary | ICD-10-CM | POA: Clinically undetermined

## 2019-06-08 LAB — COMPREHENSIVE METABOLIC PANEL
ALT: 90 U/L — ABNORMAL HIGH (ref 0–44)
AST: 32 U/L (ref 15–41)
Albumin: 2.5 g/dL — ABNORMAL LOW (ref 3.5–5.0)
Alkaline Phosphatase: 68 U/L (ref 38–126)
Anion gap: 15 (ref 5–15)
BUN: 104 mg/dL — ABNORMAL HIGH (ref 8–23)
CO2: 18 mmol/L — ABNORMAL LOW (ref 22–32)
Calcium: 9.8 mg/dL (ref 8.9–10.3)
Chloride: 109 mmol/L (ref 98–111)
Creatinine, Ser: 3.68 mg/dL — ABNORMAL HIGH (ref 0.44–1.00)
GFR calc Af Amer: 13 mL/min — ABNORMAL LOW (ref >60)
GFR calc non Af Amer: 11 mL/min — ABNORMAL LOW (ref >60)
Glucose, Bld: 119 mg/dL — ABNORMAL HIGH (ref 70–99)
Potassium: 4.7 mmol/L (ref 3.5–5.1)
Sodium: 142 mmol/L (ref 135–145)
Total Bilirubin: 3.9 mg/dL — ABNORMAL HIGH (ref 0.3–1.2)
Total Protein: 5.3 g/dL — ABNORMAL LOW (ref 6.5–8.1)

## 2019-06-08 LAB — LACTIC ACID, PLASMA
Lactic Acid, Venous: 2.6 mmol/L (ref 0.5–1.9)
Lactic Acid, Venous: 2.2 mmol/L (ref 0.5–1.9)
Lactic Acid, Venous: 2 mmol/L (ref 0.5–1.9)

## 2019-06-08 LAB — CBC
HCT: 31.1 % — ABNORMAL LOW (ref 36.0–46.0)
Hemoglobin: 9.9 g/dL — ABNORMAL LOW (ref 12.0–15.0)
MCH: 25 pg — ABNORMAL LOW (ref 26.0–34.0)
MCHC: 31.8 g/dL (ref 30.0–36.0)
MCV: 78.5 fL — ABNORMAL LOW (ref 80.0–100.0)
Platelets: 99 10*3/uL — ABNORMAL LOW (ref 150–400)
RBC: 3.96 MIL/uL (ref 3.87–5.11)
RDW: 26.5 % — ABNORMAL HIGH (ref 11.5–15.5)
WBC: 15.9 10*3/uL — ABNORMAL HIGH (ref 4.0–10.5)
nRBC: 1.1 % — ABNORMAL HIGH (ref 0.0–0.2)

## 2019-06-08 LAB — GLUCOSE, CAPILLARY
Glucose-Capillary: 132 mg/dL — ABNORMAL HIGH (ref 70–99)
Glucose-Capillary: 135 mg/dL — ABNORMAL HIGH (ref 70–99)
Glucose-Capillary: 109 mg/dL — ABNORMAL HIGH (ref 70–99)
Glucose-Capillary: 96 mg/dL (ref 70–99)
Glucose-Capillary: 120 mg/dL — ABNORMAL HIGH (ref 70–99)
Glucose-Capillary: 119 mg/dL — ABNORMAL HIGH (ref 70–99)
Glucose-Capillary: 121 mg/dL — ABNORMAL HIGH (ref 70–99)

## 2019-06-08 MED ORDER — SODIUM CHLORIDE 0.9 % IV BOLUS
500.0000 mL | Freq: Once | INTRAVENOUS | Status: AC
  Administered 2019-06-08: 500 mL via INTRAVENOUS

## 2019-06-08 NOTE — Progress Notes (Addendum)
CRITICAL VALUE ALERT  Critical Value:  Lactic Acid   2.6  Date & Time Notied: 06/07/2019    1616  Provider Notified: Dr. Lisbeth Ply  Orders Received/Actions taken: 500cc Bolus infusing

## 2019-06-08 NOTE — Progress Notes (Signed)
Elevated HR.  Will attempt echo once HR normalizes.

## 2019-06-08 NOTE — Progress Notes (Signed)
SLP will sign off, please reorder as desire. Note pt denies dysphagia but has decreased appetite.   Luanna Salk, [place] [facility] SLP Acute Rehab Services Pager [phone] Office [phone]

## 2019-06-08 NOTE — Progress Notes (Signed)
MD in to see pt overall condition unchanged. 12 lead EKG completed. IV fluids patent. SRP, RN

## 2019-06-08 NOTE — Care Management Important Message (Signed)
Important Message  Patient Details IM Letter given to [name]  SW to present to the Patient Name: Kathleen [name] MRN: [id number] Date of Birth: 1946/03/28   Medicare Important Message Given:  Yes     Kerin Salen 06/08/2019, 12:13 PM

## 2019-06-08 NOTE — Progress Notes (Addendum)
[facility] PROGRESS NOTE  [name] WJX:[id number] DOB: [birth year] DOA: 05/30/2019 PCP: Nolene Ebbs, MD  Assessment/Plan:  1. Hypotension, new over the last 24 hours.  Has diminished p.o. intake,  remains afebrile with no new abdominal pain or dyspnea, reviewed chest x-ray and abdominal x-ray both nonacute,  continue maintenance fluids-reduced rate to 50 cc/hr per discussion with nephrology given oliguric CKD, in addition to 500 cc bolus x1 and close monitoring, check lactic acid.     2. Chronic A. fib, with intermittent RVR.  Intermittently having elevated heart rate > 110 in review of telemetry, EKG today shows persistent A. fib   not on anticoagulation because of prior history of GI bleed.  Oral Lopressor(hold parameters in place), otherwise limited by hypotension, cardiology consulted for assistance  3. Acute mild descending colon diverticulitis/pancreatitis.  Completed 7-day course of Cipro and Flagyl not having any abdominal pain though still has minimal appetite.  Abdominal x-ray shows gaseous distention with patient having normal BMs as of today. Unclear etiology of pancreatitis  4. CKD, Stage 4, oliguric. Stable at baseline (3.1-3.8) slightly worsening BUN, urine output remains less than 0.3 mix per cake per hour, discussed with Dr. Burnett Sheng will decrease rate of IV fluids from 150 cc, closely monitor output/BUN/creatinine, he will evaluate tomorrow history has received almost 24 hours of IV fluids for #1, avoid nephroxtoxins, monitor output and daily BMP. Brief insufficiency related to dehydration from diarrhea/diminished PO intake on admission that resolved with IV fluids.  5. Urine discoloration. Seems to be dark likely from dehydration, UA shows many bacteria but no leukocytes and asymptomatic.  continue IVF   6. Sirs criteria, new leukocytosis, tachycardia, relative hypotension.  Remains afebrile, obtain blood cultures if spikes fever, repeat chest x-ray shows  questionable atelectasis versus consolidation has no O2 requirements and no respiratory symptoms will closely monitor, , doubt infection, UA with bacteria but asymptomatic. Will repeat cbc  7. Slurred speech. Sister reports it has slowly improved over the last few days. No other deficits. CT head on 6/18 neg. Will monitor if worsens consider MRI. Could be related to not having dentures  8. Acute metabolic encephalopathy, resolved. Suspect related to narcotics earlier in hospital as well as acute illness, and electrolyte imbalances (hypernatremia) given negative CT head/MRI. Was unable to tolerate lactulose for presumed hepatic encephalophaty (based on asterixis, ammonial was wnl)  9. Dysphagia. Speech evaluated and rec'd dysphagia 3 diet, but now having diminished appetite, denies any dysphasia or nausea  10. Hypernatremia. Resolved with IVF  11. Metabolic acidosis. In setting of CKD. Resolved with bicarb gtt.   12. COPD. Stable. On home inhalers  13. Chronic Thrombocytopenia. Stable(80s-90s) no bleeding, daily CBC  14. Hyperbilirubinemia and mild transaminitis, trending down.  T bili peaked at 5.6--4.5--3.9 .  ALT 90. Unclear etiology. No abdominal pain, hepatitis panel unremarkable, has history of mild pulmonary htn, will repeat TTE here ?  Hepatic congestion, consider abd ultrasound if TTE unremarkable? trend CMP  15. Hypertension.  BiDil has been on hold since admission, will continue given new hypotension  16. Chronic CHF with preserved EF. Normal volume status, close monitoring with daily weights, holding lasix  17. Vitamin B12 deficiency. S/p IV repletion on oral replacement currently.   18. Type 2 DM.  A1c 6.3, CBGs in the teens-120s with SSI  19. Hyperlipidemia. Held pravastatin since slight elevation of LFT  20. History of GI bleed ( angiodysplasia). Home protonix, hgb stable, close monitoring  21. Gout, stable. Home allopurinol  22.  Stage 2 Pressure injury to sacrum,  Present on admission. Local care.   23. Physical Deconditioning. PT recs SNF. Unable to even transfer to bed to chair without max assistance ( different from baseline) Family and patient agreeable. S/W consulted  Time spent: Greater than 30 minutes in care for this patient  Code Status: FULL  Family Communication: sister Kathleen [name] updated at bedside, called daughter Kathleen [name] [phone] Disposition Plan: Needs IV fluids for persistent hypotension, check lactic acid, unclear etiology, currently consulted for intermittent RVR of atrial fibrillation, TTE for pulm htn, amenable to go to SNF now once medically stable   Consultants:  GI  Cardiology  Procedures:  none  Antibiotics:  Ciprofloxacin, Flagyl   HPI/Subjective:  Kathleen [name] is a 73 y.o. year old female with medical history significant for hypertension, diastolic heart failure, chronic kidney disease a stage IV, coronary artery disease, diabetes, paroxysmal A. fib history of GI bleed who presented on 05/30/2019 with persistent nausea vomiting abdominal pain and diarrhea x 2 weeks as well as dysphagia to solids and liquids and was found to have diverticulitis, mild pancreatitis, AKI on CKD and acute metabolic encephlopathy.  No acute complaints No acute events overnight  Objective: Vitals:   06/08/19 1403 06/08/19 1408  BP: (!) 75/51 (!) 77/52  Pulse: (!) 112 (!) 108  Resp: (!) 21   Temp: 97.6 F (36.4 C)   SpO2: 99%     Intake/Output Summary (Last 24 hours) at 06/08/2019 1439 Last data filed at 06/08/2019 6195 Gross per 24 hour  Intake 600 ml  Output -  Net 600 ml   Filed Weights   06/06/19 0500 06/07/19 0500 06/08/19 0500  Weight: 91.4 kg 98.1 kg 90.3 kg    Exam:   General:  Obese female, no distress  Cardiovascular: IRR, tachycardic, no appreciable murmurs or edema  Respiratory: normal respiratory effort on room air, clear breath sounds  Abdomen: soft, non-distended, non-tender  Musculoskeletal:  normal ROM   Skin dry, intact  Neurologic alert, oriented to person, place, context, ? Slurred speech(edentulous), no facial droop, no focal deficits otherwise  Data Reviewed: Basic Metabolic Panel: Recent Labs  Lab 06/02/19 0737 06/03/19 0423 06/04/19 0418 06/05/19 0528 06/07/19 0412 06/08/19 0400  NA 139 141 143 143 142 142  K 3.1* 3.4* 3.6 3.8 4.4 4.7  CL 105 107 109 107 108 109  CO2 20* 19* 21* 19* 20* 18*  GLUCOSE 210* 110* 137* 120* 123* 119*  BUN 90* 85* 79* 86* 93* 104*  CREATININE 3.69* 3.48* 3.21* 3.36* 3.46* 3.68*  CALCIUM 8.1* 8.0* 7.3* 7.3* 9.1 9.8  MG 1.9  --   --   --   --   --    Liver Function Tests: Recent Labs  Lab 06/03/19 0423 06/04/19 0418 06/05/19 0528 06/07/19 0412 06/08/19 0400  AST 32 32 41 34 32  ALT 34 38 59* 83* 90*  ALKPHOS 62 60 68 69 68  BILITOT 5.1* 5.0* 5.4* 4.5* 3.9*  PROT 5.6* 5.5* 5.9* 5.5* 5.3*  ALBUMIN 2.6* 2.5* 2.7* 2.6* 2.5*   No results for input(s): LIPASE, AMYLASE in the last 168 hours. No results for input(s): AMMONIA in the last 168 hours. CBC: Recent Labs  Lab 06/03/19 0423 06/04/19 0418 06/05/19 0528 06/07/19 0412 06/08/19 0400  WBC 4.9 6.8 8.7 14.4* 15.9*  HGB 10.0* 10.3* 10.5* 10.1* 9.9*  HCT 31.3* 32.1* 32.6* 32.5* 31.1*  MCV 77.3* 76.2* 76.3* 78.9* 78.5*  PLT 70* 71* 80* 89* 99*  Cardiac Enzymes: No results for input(s): CKTOTAL, CKMB, CKMBINDEX, TROPONINI in the last 168 hours. BNP (last 3 results) Recent Labs    12/19/18 2020 05/30/19 0417  BNP 863.6* 1,478.3*    ProBNP (last 3 results) No results for input(s): PROBNP in the last 8760 hours.  CBG: Recent Labs  Lab 06/07/19 2324 06/08/19 0342 06/08/19 0432 06/08/19 0742 06/08/19 1150  GLUCAP 132* 135* 109* 96 120*    Recent Results (from the past 240 hour(s))  SARS Coronavirus 2 (CEPHEID - Performed in [place] hospital lab), Hosp Order     Status: None   Collection Time: 05/30/19  6:17 AM   Specimen: Nasopharyngeal Swab   Result Value Ref Range Status   SARS Coronavirus 2 NEGATIVE NEGATIVE Final    Comment: (NOTE) If result is NEGATIVE SARS-CoV-2 target nucleic acids are NOT DETECTED. The SARS-CoV-2 RNA is generally detectable in upper and lower  respiratory specimens during the acute phase of infection. The lowest  concentration of SARS-CoV-2 viral copies this assay can detect is 250  copies / mL. A negative result does not preclude SARS-CoV-2 infection  and should not be used as the sole basis for treatment or other  patient management decisions.  A negative result may occur with  improper specimen collection / handling, submission of specimen other  than nasopharyngeal swab, presence of viral mutation(s) within the  areas targeted by this assay, and inadequate number of viral copies  (<250 copies / mL). A negative result must be combined with clinical  observations, patient history, and epidemiological information. If result is POSITIVE SARS-CoV-2 target nucleic acids are DETECTED. The SARS-CoV-2 RNA is generally detectable in upper and lower  respiratory specimens dur ing the acute phase of infection.  Positive  results are indicative of active infection with SARS-CoV-2.  Clinical  correlation with patient history and other diagnostic information is  necessary to determine patient infection status.  Positive results do  not rule out bacterial infection or co-infection with other viruses. If result is PRESUMPTIVE POSTIVE SARS-CoV-2 nucleic acids MAY BE PRESENT.   A presumptive positive result was obtained on the submitted specimen  and confirmed on repeat testing.  While 2019 novel coronavirus  (SARS-CoV-2) nucleic acids may be present in the submitted sample  additional confirmatory testing may be necessary for epidemiological  and / or clinical management purposes  to differentiate between  SARS-CoV-2 and other Sarbecovirus currently known to infect humans.  If clinically indicated additional  testing with an alternate test  methodology [phone]) is advised. The SARS-CoV-2 RNA is generally  detectable in upper and lower respiratory sp ecimens during the acute  phase of infection. The expected result is Negative. Fact Sheet for Patients:  StrictlyIdeas.no Fact Sheet for Healthcare Providers: BankingDealers.co.za This test is not yet approved or cleared by the [place] FDA and has been authorized for detection and/or diagnosis of SARS-CoV-2 by FDA under an Emergency Use Authorization (EUA).  This EUA will remain in effect (meaning this test can be used) for the duration of the COVID-19 declaration under Section 564(b)(1) of the Act, 21 U.S.C. section 360bbb-3(b)(1), unless the authorization is terminated or revoked sooner. Performed at [facility], [place] [address], [place], [place] [address]   Urine Culture     Status: None   Collection Time: 05/31/19  2:26 PM   Specimen: Urine, Catheterized  Result Value Ref Range Status   Specimen Description   Final    URINE, CATHETERIZED Performed at [facility], [place] [address], [place], [place] [address]    Special Requests   Final    NONE Performed at [facility], [place] [address], [place], [place] [address]    Culture   Final    NO GROWTH Performed at [facility], [place] [address] [place], [place] [address]    Report Status 06/01/2019 FINAL  Final  Gastrointestinal Panel by PCR , Stool     Status: None   Collection Time: 06/01/19  1:44 PM   Specimen: Stool  Result Value Ref Range Status   Campylobacter species NOT DETECTED NOT DETECTED Final   Plesimonas shigelloides NOT DETECTED NOT DETECTED Final   Salmonella species NOT DETECTED NOT DETECTED Final   Yersinia enterocolitica NOT DETECTED NOT DETECTED Final   Vibrio species NOT DETECTED NOT DETECTED Final   Vibrio cholerae NOT DETECTED NOT DETECTED Final    Enteroaggregative E coli (EAEC) NOT DETECTED NOT DETECTED Final   Enteropathogenic E coli (EPEC) NOT DETECTED NOT DETECTED Final   Enterotoxigenic E coli (ETEC) NOT DETECTED NOT DETECTED Final   Shiga like toxin producing E coli (STEC) NOT DETECTED NOT DETECTED Final   Shigella/Enteroinvasive E coli (EIEC) NOT DETECTED NOT DETECTED Final   Cryptosporidium NOT DETECTED NOT DETECTED Final   Cyclospora cayetanensis NOT DETECTED NOT DETECTED Final   Entamoeba histolytica NOT DETECTED NOT DETECTED Final   Giardia lamblia NOT DETECTED NOT DETECTED Final   Adenovirus F40/41 NOT DETECTED NOT DETECTED Final   Astrovirus NOT DETECTED NOT DETECTED Final   Norovirus GI/GII NOT DETECTED NOT DETECTED Final   Rotavirus A NOT DETECTED NOT DETECTED Final   Sapovirus (I, II, IV, and V) NOT DETECTED NOT DETECTED Final    Comment: Performed at [facility], [address] [place], [place] [address]     Studies: Dg Abd 1 View  Result Date: 06/08/2019 CLINICAL DATA:  Leukocytosis.  Hypotension.  Nausea and vomiting. EXAM: ABDOMEN - 1 VIEW COMPARISON:  10/09/2010.  CT 05/30/2019 FINDINGS: Single view of the abdomen was obtained. Mild to moderate distention of the stomach with gas. Scattered gas-filled loops of bowel throughout the abdomen and pelvis. Cholecystectomy clips in the right upper abdomen. Limited evaluation for free air on this single view. IMPRESSION: Gas-filled bowel loops throughout the abdomen and pelvis and there appears to be gas in the rectal area. Findings are not suggestive for an obstructive process but could be related to an ileus pattern. Electronically Signed   By: Markus Daft M.D.   On: 06/08/2019 10:22   Dg Chest Port 1 View  Result Date: 06/08/2019 CLINICAL DATA:  GI bleed EXAM: PORTABLE CHEST - 1 VIEW COMPARISON:  06/01/2019 FINDINGS: Persistent left infrahilar atelectasis/consolidation. Right lung clear. Heart size upper limits normal. Aortic Atherosclerosis (ICD10-170.0). No  effusion. Vertebral endplate spurring at multiple levels in the lower thoracic spine. IMPRESSION: Persistent left infrahilar atelectasis/consolidation. Electronically Signed   By: Lucrezia Europe M.D.   On: 06/08/2019 10:31    Scheduled Meds: . allopurinol  100 mg Oral Daily  . famotidine  20 mg Oral q1800  . insulin aspart  0-9 Units Subcutaneous Q4H  . metoprolol tartrate  25 mg Oral BID  . mometasone-formoterol  2 puff Inhalation BID  . pantoprazole  40 mg Oral BID  . sodium chloride flush  3 mL Intravenous Q12H  . thiamine  100 mg Oral Daily   Continuous Infusions: . sodium chloride    . sodium chloride 100 mL/hr at 06/08/19 0424  Principal Problem:   Dysphagia Active Problems:   Essential hypertension   Diabetes mellitus type 2, controlled (HCC)   HLD (hyperlipidemia)   CKD (chronic kidney disease) stage 4, GFR 15-29 ml/min (HCC)   Chronic diastolic CHF (congestive heart failure) (HCC)   Atrial fibrillation, chronic   GI bleed   Idiopathic acute pancreatitis without infection or necrosis   Diverticulitis of large intestine without perforation or abscess with bleeding      Desiree Hane  [facility]

## 2019-06-08 NOTE — NC FL2 (Signed)
[place] LEVEL OF CARE SCREENING TOOL     IDENTIFICATION  Patient Name: Kathleen [name] Birthdate: [birth year] Sex: female Admission Date (Current Location): 05/30/2019  [facility] and [place] Number:  Herbalist and Address:  [facility],  [place], [place]      Provider Number: [id number]  Attending Physician Name and Address:  Desiree Hane, MD  Relative Name and Phone Number:  Martin Majestic [email or web address],[phone]    Current Level of Care: Hospital Recommended Level of Care: [place] Prior Approval Number:    Date Approved/Denied:   PASRR Number: [id number]  Discharge Plan: SNF    Current Diagnoses: Patient Active Problem List   Diagnosis Date Noted  . Idiopathic acute pancreatitis without infection or necrosis   . Diverticulitis of large intestine without perforation or abscess with bleeding   . Dysphagia 05/30/2019  . AVM (arteriovenous malformation) of duodenum, acquired   . AVM (arteriovenous malformation) of stomach, acquired with hemorrhage   . AKI (acute kidney injury) ([place]) 02/02/2019  . Symptomatic anemia 12/19/2018  . GIB (gastrointestinal bleeding) 09/12/2018  . GI bleed 09/11/2018  . Malnutrition of moderate degree 06/26/2018  . Pressure injury of skin 06/26/2018  . Hypotension due to hypovolemia 06/22/2018  . Acute metabolic encephalopathy 16/38/4536  . Transaminitis 06/22/2018  . Anemia due to GI blood loss 06/22/2018  . Epigastric pain 06/22/2018  . Decreased oral intake 06/22/2018  . Hypovolemic shock ([place]) 06/22/2018  . Duodenitis 06/07/2018  . Chronic gastritis 06/07/2018  . Abnormal LFTs   . Upper GI bleed   . Melena 06/04/2018  . Atrial fibrillation, chronic 06/04/2018  . SOB (shortness of breath) 03/08/2018  . SIRS (systemic inflammatory response syndrome) ([place]) 12/04/2017  . Hypothermia 12/04/2017  . Elevated LFTs 12/04/2017  . Sepsis ([place]) 12/04/2017  . Atrial flutter  ([place]) 03/17/2015  . NSTEMI (non-ST elevated myocardial infarction) ([place])   . Chronic diastolic CHF (congestive heart failure) ([place])   . Type 2 diabetes mellitus with hypoglycemia without coma ([place])   . Paroxysmal atrial fibrillation ([place]) 01/25/2015  . Non-ST elevation MI (NSTEMI) ([place]) 01/23/2015  . Diabetes mellitus type 2, controlled ([place]) 01/23/2015  . HLD (hyperlipidemia)   . CKD (chronic kidney disease) stage 4, GFR 15-29 ml/min (HCC)   . Acute congestive heart failure ([place]) 09/17/2014  . Asthma 03/26/2014  . GERD (gastroesophageal reflux disease) 03/26/2014  . Smoker 03/26/2014  . Postmenopausal bleeding 01/10/2014  . Carotid stenosis 01/04/2012  . Carpal tunnel syndrome on left 11/26/2011  . Transient ischemic attack on medication 11/02/2011  . Hyperlipidemia with target LDL less than 70 09/17/2008  . OBESITY 09/17/2008  . Essential hypertension 09/17/2008  . Coronary atherosclerosis 09/17/2008    Orientation RESPIRATION BLADDER Height & Weight     Self, Time, Situation, Place  Normal External catheter, Incontinent Weight: 199 lb 1.2 oz (90.3 kg) Height:  5\' 4"  (162.6 cm)  BEHAVIORAL SYMPTOMS/MOOD NEUROLOGICAL BOWEL NUTRITION STATUS      Continent Diet(dsy3)  AMBULATORY STATUS COMMUNICATION OF NEEDS Skin   Extensive Assist Verbally                         Personal Care Assistance Level of Assistance  Bathing, Feeding, Dressing Bathing Assistance: Limited assistance Feeding assistance: Limited assistance Dressing Assistance: Limited assistance     Functional Limitations Info  Sight, Hearing, Speech Sight Info: Adequate Hearing Info: Adequate Speech Info: Impaired(doesnt have her teeth)  SPECIAL CARE FACTORS FREQUENCY  PT (By licensed PT), OT (By licensed OT)     PT Frequency: 5x wk OT Frequency: 5x wk            Contractures      Additional Factors Info  Code Status, Allergies Code Status Info: full code Allergies Info: Penicillins            Current Medications (06/08/2019):  This is the current hospital active medication list Current Facility-Administered Medications  Medication Dose Route Frequency Provider Last Rate Last Dose  . 0.9 %  sodium chloride infusion  250 mL Intravenous PRN Adhikari, Amrit, MD      . 0.9 %  sodium chloride infusion   Intravenous Continuous Oretha Milch D, MD 100 mL/hr at 06/08/19 0424    . acetaminophen (TYLENOL) tablet 650 mg  650 mg Oral Q6H PRN Hollace Hayward K, NP   650 mg at 06/07/19 1030  . albuterol (PROVENTIL) (2.5 MG/3ML) 0.083% nebulizer solution 2.5 mg  2.5 mg Nebulization Q6H PRN Shelly Coss, MD      . allopurinol (ZYLOPRIM) tablet 100 mg  100 mg Oral Daily Adhikari, Amrit, MD   100 mg at 06/07/19 1030  . famotidine (PEPCID) tablet 20 mg  20 mg Oral q1800 Polly Cobia, RPH   20 mg at 06/07/19 1824  . insulin aspart (novoLOG) injection 0-9 Units  0-9 Units Subcutaneous Q4H Kirby-Graham, Karsten Fells, NP   1 Units at 06/08/19 0022  . metoprolol tartrate (LOPRESSOR) tablet 25 mg  25 mg Oral BID Hollace Hayward K, NP   25 mg at 06/07/19 1031  . mometasone-formoterol (DULERA) 100-5 MCG/ACT inhaler 2 puff  2 puff Inhalation BID Shelly Coss, MD   2 puff at 06/08/19 0726  . ondansetron (ZOFRAN) injection 4 mg  4 mg Intravenous Q6H PRN Shelly Coss, MD   4 mg at 06/02/19 2228  . pantoprazole (PROTONIX) EC tablet 40 mg  40 mg Oral BID Polly Cobia, RPH   40 mg at 06/07/19 2330  . sodium chloride flush (NS) 0.9 % injection 3 mL  3 mL Intravenous Q12H Shelly Coss, MD   3 mL at 06/08/19 0011  . sodium chloride flush (NS) 0.9 % injection 3 mL  3 mL Intravenous PRN Shelly Coss, MD   3 mL at 06/03/19 0943  . thiamine (VITAMIN B-1) tablet 100 mg  100 mg Oral Daily Polly Cobia, RPH   100 mg at 06/07/19 1031     Discharge Medications: Please see discharge summary for a list of discharge medications.  Relevant Imaging Results:  Relevant Lab Results:   Additional  Information SS# [id number]  Wende Neighbors, LCSW

## 2019-06-08 NOTE — Progress Notes (Signed)
MEWS converted to RED due to decrease BP increase HR and resp. Pt resting in bed, no change in LOC. Denies pain. MD made aware. SRP, RN

## 2019-06-09 ENCOUNTER — Inpatient Hospital Stay (HOSPITAL_COMMUNITY): Payer: Medicare Other

## 2019-06-09 DIAGNOSIS — I9589 Other hypotension: Secondary | ICD-10-CM

## 2019-06-09 DIAGNOSIS — I340 Nonrheumatic mitral (valve) insufficiency: Secondary | ICD-10-CM

## 2019-06-09 DIAGNOSIS — I361 Nonrheumatic tricuspid (valve) insufficiency: Secondary | ICD-10-CM

## 2019-06-09 LAB — GLUCOSE, CAPILLARY
Glucose-Capillary: 103 mg/dL — ABNORMAL HIGH (ref 70–99)
Glucose-Capillary: 109 mg/dL — ABNORMAL HIGH (ref 70–99)
Glucose-Capillary: 117 mg/dL — ABNORMAL HIGH (ref 70–99)
Glucose-Capillary: 87 mg/dL (ref 70–99)
Glucose-Capillary: 96 mg/dL (ref 70–99)
Glucose-Capillary: 97 mg/dL (ref 70–99)

## 2019-06-09 LAB — COMPREHENSIVE METABOLIC PANEL
ALT: 92 U/L — ABNORMAL HIGH (ref 0–44)
AST: 33 U/L (ref 15–41)
Albumin: 2.7 g/dL — ABNORMAL LOW (ref 3.5–5.0)
Alkaline Phosphatase: 69 U/L (ref 38–126)
Anion gap: 13 (ref 5–15)
BUN: 84 mg/dL — ABNORMAL HIGH (ref 8–23)
CO2: 18 mmol/L — ABNORMAL LOW (ref 22–32)
Calcium: 9.8 mg/dL (ref 8.9–10.3)
Chloride: 111 mmol/L (ref 98–111)
Creatinine, Ser: 3.89 mg/dL — ABNORMAL HIGH (ref 0.44–1.00)
GFR calc Af Amer: 12 mL/min — ABNORMAL LOW (ref >60)
GFR calc non Af Amer: 11 mL/min — ABNORMAL LOW (ref >60)
Glucose, Bld: 101 mg/dL — ABNORMAL HIGH (ref 70–99)
Potassium: 5.1 mmol/L (ref 3.5–5.1)
Sodium: 142 mmol/L (ref 135–145)
Total Bilirubin: 3.8 mg/dL — ABNORMAL HIGH (ref 0.3–1.2)
Total Protein: 5.6 g/dL — ABNORMAL LOW (ref 6.5–8.1)

## 2019-06-09 LAB — CBC
HCT: 32.7 % — ABNORMAL LOW (ref 36.0–46.0)
Hemoglobin: 10.1 g/dL — ABNORMAL LOW (ref 12.0–15.0)
MCH: 24.7 pg — ABNORMAL LOW (ref 26.0–34.0)
MCHC: 30.9 g/dL (ref 30.0–36.0)
MCV: 80 fL (ref 80.0–100.0)
Platelets: 95 10*3/uL — ABNORMAL LOW (ref 150–400)
RBC: 4.09 MIL/uL (ref 3.87–5.11)
RDW: 27.3 % — ABNORMAL HIGH (ref 11.5–15.5)
WBC: 16.9 10*3/uL — ABNORMAL HIGH (ref 4.0–10.5)
nRBC: 1.6 % — ABNORMAL HIGH (ref 0.0–0.2)

## 2019-06-09 LAB — ECHOCARDIOGRAM COMPLETE
Height: 64 in
Weight: 3185.21 oz

## 2019-06-09 LAB — LACTIC ACID, PLASMA: Lactic Acid, Venous: 2.2 mmol/L (ref 0.5–1.9)

## 2019-06-09 LAB — PROCALCITONIN: Procalcitonin: 8.24 ng/mL

## 2019-06-09 MED ORDER — SODIUM CHLORIDE 0.9 % IV SOLN
2.0000 g | INTRAVENOUS | Status: DC
  Administered 2019-06-09 – 2019-06-13 (×5): 2 g via INTRAVENOUS
  Filled 2019-06-09 (×6): qty 2

## 2019-06-09 MED ORDER — SODIUM CHLORIDE 0.9 % IV SOLN
250.0000 mL | INTRAVENOUS | Status: DC | PRN
  Administered 2019-06-09: 18:00:00 via INTRAVENOUS
  Administered 2019-06-10: 250 mL via INTRAVENOUS

## 2019-06-09 MED ORDER — DILTIAZEM HCL-DEXTROSE 100-5 MG/100ML-% IV SOLN (PREMIX)
5.0000 mg/h | INTRAVENOUS | Status: DC
  Filled 2019-06-09: qty 100

## 2019-06-09 MED ORDER — SODIUM CHLORIDE 0.9 % IV BOLUS: 250.0000 mL | Freq: Once | INTRAVENOUS | Status: AC

## 2019-06-09 MED ORDER — SODIUM CHLORIDE 0.9 % IV BOLUS
500.0000 mL | Freq: Once | INTRAVENOUS | Status: AC
  Administered 2019-06-09: 500 mL via INTRAVENOUS

## 2019-06-09 MED ORDER — DILTIAZEM HCL 100 MG IV SOLR
5.0000 mg/h | INTRAVENOUS | Status: DC
  Administered 2019-06-09 – 2019-06-10 (×2): 5 mg/h via INTRAVENOUS
  Filled 2019-06-09 (×3): qty 100

## 2019-06-09 NOTE — Progress Notes (Signed)
CRITICAL VALUE ALERT  Critical Value:  Lactic 2.0  Date & Time Notied:  06/08/19@2202   Provider Notified: Yes  Orders Received/Actions taken:

## 2019-06-09 NOTE — Progress Notes (Signed)
PROGRESS NOTE  Kathleen [name] WFU:[id number] DOB: 04-14-1946 DOA: 05/30/2019 PCP: Nolene Ebbs, MD  HPI/Recap of past 24 hours:  Kathleen [name] is a 73 y.o. year old female with medical history significant for hypertension, diastolic heart failure, chronic kidney disease a stage IV, coronary artery disease, diabetes, paroxysmal A. fib history of GI bleed who presented on 05/30/2019 with persistent nausea vomiting abdominal pain and diarrhea x 2 weeks as well as dysphagia to solids and liquids and was found to have diverticulitis, mild pancreatitis, AKI on CKD and acute metabolic encephlopathy.  Patient's hospital course complicated by episodes of rapid A. fib causing persistent hypotension and poor p.o. intake despite 7-day course treating her diverticulitis and resolution of her pancreatitis.  Patient underwent echocardiogram 6/27 which was unrevealing.  Started on low-dose Cardizem drip and given bolus of IV fluids.  Patient herself has no complaints  Assessment/Plan: Principal Problem:   Dysphagia Active Problems:   Essential hypertension   Diabetes mellitus type 2, controlled (HCC)   HLD (hyperlipidemia)   CKD (chronic kidney disease) stage 4, GFR 15-29 ml/min (HCC)   Chronic diastolic CHF (congestive heart failure) (HCC)   Atrial fibrillation, chronic   Abnormal LFTs   GI bleed   Idiopathic acute pancreatitis without infection or necrosis   Diverticulitis of large intestine without perforation or abscess with bleeding   Hypotension   Atrial fibrillation with RVR ([place]) 1. Leukocytosis.    Remains afebrile, however white count continues to elevate.  Urinalysis done a few days ago unremarkable.  Chest x-ray notes persistent consolidation.  We will go ahead and check procalcitonin level.  If elevated, will start antibiotics to cover for aspiration pneumonia and ask speech therapy to reevaluate.  White blood cell count could be reactive stress margination secondary to persistent  hypotension from A. fib.   2. Chronic A. fib, with intermittent RVR with secondary hypotension.  Intermittently having elevated heart rate > 110 in review of telemetry, EKG today shows persistent A. fib   not on anticoagulation because of prior history of GI bleed.    Change to oral Lopressor to IV Cardizem.  Will try to improve hypotension with fluid bolus.  Transition to oral in the morning  3. Acute mild descending colon diverticulitis/pancreatitis.  Completed 7-day course of Cipro and Flagyl not having any abdominal pain though still has minimal appetite.  Abdominal x-ray shows gaseous distention with patient having normal BMs as of today. Unclear etiology of pancreatitis  4. AKI in the setting ofCKD, Stage 4, oliguric. Stable at baseline (3.1-3.8) slightly worsening BUN, urine output remains less than 0.3 mix per cake per hour, after discussion with with Dr. Jonnie Finner from nephrology will decrease rate of IV fluids from 150 cc, closely monitor output/BUN/creatinine, formal consult to come.  By reviewing her history, has received almost 24 hours of IV fluids for hypotension, avoid nephroxtoxins, monitor output and daily BMP. Brief insufficiency related to dehydration from diarrhea/diminished PO intake on admission that resolved with IV fluids.  May be secondary to hypotension.  Trying fluid bolus.  Nephrology to see.  5. Urine discoloration. Seems to be dark likely from dehydration, UA shows many bacteria but no leukocytes and asymptomatic.  continue IVF   6. Obesity: Patient meets criteria with BMI greater than 30  7. Slurred speech. Sister reports it has slowly improved over the last few days. No other deficits. CT head on 6/18 neg. Will monitor if worsens consider MRI. Could be related to not having dentures  8. Acute metabolic encephalopathy, resolved. Suspect related to narcotics earlier in hospital as well as acute illness, and electrolyte imbalances (hypernatremia) given negative CT  head/MRI. Was unable to tolerate lactulose for presumed hepatic encephalophaty (based on asterixis, ammonial was wnl).  Resolved  9. Dysphagia. Speech evaluated and rec'd dysphagia 3 diet, but now having diminished appetite, denies any dysphasia or nausea  10. Hypernatremia. Resolved with IVF  11. Metabolic acidosis. In setting of CKD. Resolved with bicarb gtt.   12. COPD. Stable. On home inhalers  13. Chronic Thrombocytopenia. Stable(80s-90s) no bleeding, daily CBC  14. Hyperbilirubinemia and mild transaminitis, trending down.  T bili peaked at 5.6--4.5--3.9 .  ALT 90. Unclear etiology. No abdominal pain, hepatitis panel unremarkable, has history of mild pulmonary htn, will repeat TTE here ?  Hepatic congestion, consider abd ultrasound if TTE unremarkable? trend CMP  15. Hypertension.  BiDil has been on hold since admission, will continue given new hypotension  16. Chronic CHF with preserved EF. Normal volume status, close monitoring with daily weights, holding lasix  17. Vitamin B12 deficiency. S/p IV repletion on oral replacement currently.   18. Type 2 DM.  A1c 6.3, CBGs in the teens-120s with SSI  19. Hyperlipidemia. Held pravastatin since slight elevation of LFT  20. History of GI bleed ( angiodysplasia). Home protonix, hgb stable, close monitoring  21. Gout, stable. Home allopurinol  22. Stage 2 Pressure injury to sacrum, Present on admission. Local care.   Physical Deconditioning. PT recs SNF. Unable to even transfer to bed to chair without max assistance ( different from baseline) Family and patient agreeable. S/W consulted  Code Status: Full code  Family Communication: Updated daughter by phone   Disposition Plan: Discharge to skilled nursing once because of leukocytosis decided upon and heart rate from atrial fibrillation and blood pressure stabilized   Consultants:  Gastroenterology  Cardiology  Procedures:  Echocardiogram done 6/27 with  preserved ejection fraction and indeterminant diastolic dysfunction.  Moderate mitral regurg  Antimicrobials:  Completed 7-day course of Cipro and Flagyl  DVT prophylaxis: SCDs   Objective: Vitals:   06/09/19 1322 06/09/19 1426  BP: 95/68 (!) 89/52  Pulse: 94 (!) 112  Resp: 16   Temp: 97.8 F (36.6 C)   SpO2: 96%     Intake/Output Summary (Last 24 hours) at 06/09/2019 1547 Last data filed at 06/09/2019 1300 Gross per 24 hour  Intake 0 ml  Output 250 ml  Net -250 ml   Filed Weights   06/06/19 0500 06/07/19 0500 06/08/19 0500  Weight: 91.4 kg 98.1 kg 90.3 kg   Body mass index is 34.17 kg/m.  Exam:   General: Alert and oriented x2, no acute distress  HEENT: Normocephalic and atraumatic, mucous membranes slightly dry  Neck: Supple, no JVD  Cardiovascular: Irregular rhythm, mild tachycardia  Respiratory: Clear to auscultation bilaterally  Abdomen: Soft, nontender, mild distention, hypoactive bowel sounds  Musculoskeletal: No clubbing or cyanosis, trace pitting edema  Skin: No skin breaks, tears or lesions  Neuro: No focal deficits  Psychiatry: Appropriate, no evidence of psychoses   Data Reviewed: CBC: Recent Labs  Lab 06/04/19 0418 06/05/19 0528 06/07/19 0412 06/08/19 0400 06/09/19 0417  WBC 6.8 8.7 14.4* 15.9* 16.9*  HGB 10.3* 10.5* 10.1* 9.9* 10.1*  HCT 32.1* 32.6* 32.5* 31.1* 32.7*  MCV 76.2* 76.3* 78.9* 78.5* 80.0  PLT 71* 80* 89* 99* 95*   Basic Metabolic Panel: Recent Labs  Lab 06/04/19 0418 06/05/19 0528 06/07/19 0412 06/08/19 0400 06/09/19 0417  NA  143 143 142 142 142  K 3.6 3.8 4.4 4.7 5.1  CL 109 107 108 109 111  CO2 21* 19* 20* 18* 18*  GLUCOSE 137* 120* 123* 119* 101*  BUN 79* 86* 93* 104* 84*  CREATININE 3.21* 3.36* 3.46* 3.68* 3.89*  CALCIUM 7.3* 7.3* 9.1 9.8 9.8   GFR: Estimated Creatinine Clearance: 13.8 mL/min (A) (by C-G formula based on SCr of 3.89 mg/dL (H)). Liver Function Tests: Recent Labs  Lab 06/04/19  0418 06/05/19 0528 06/07/19 0412 06/08/19 0400 06/09/19 0417  AST 32 41 34 32 33  ALT 38 59* 83* 90* 92*  ALKPHOS 60 68 69 68 69  BILITOT 5.0* 5.4* 4.5* 3.9* 3.8*  PROT 5.5* 5.9* 5.5* 5.3* 5.6*  ALBUMIN 2.5* 2.7* 2.6* 2.5* 2.7*   No results for input(s): LIPASE, AMYLASE in the last 168 hours. No results for input(s): AMMONIA in the last 168 hours. Coagulation Profile: No results for input(s): INR, PROTIME in the last 168 hours. Cardiac Enzymes: No results for input(s): CKTOTAL, CKMB, CKMBINDEX, TROPONINI in the last 168 hours. BNP (last 3 results) No results for input(s): PROBNP in the last 8760 hours. HbA1C: Recent Labs    06/06/19 1837  HGBA1C 6.3*   CBG: Recent Labs  Lab 06/08/19 2012 06/09/19 0007 06/09/19 0427 06/09/19 0752 06/09/19 1159  GLUCAP 121* 103* 97 96 87   Lipid Profile: No results for input(s): CHOL, HDL, LDLCALC, TRIG, CHOLHDL, LDLDIRECT in the last 72 hours. Thyroid Function Tests: No results for input(s): TSH, T4TOTAL, FREET4, T3FREE, THYROIDAB in the last 72 hours. Anemia Panel: No results for input(s): VITAMINB12, FOLATE, FERRITIN, TIBC, IRON, RETICCTPCT in the last 72 hours. Urine analysis:    Component Value Date/Time   COLORURINE AMBER (A) 06/07/2019 0636   APPEARANCEUR CLEAR 06/07/2019 0636   LABSPEC 1.014 06/07/2019 0636   PHURINE 5.0 06/07/2019 0636   GLUCOSEU NEGATIVE 06/07/2019 0636   HGBUR SMALL (A) 06/07/2019 0636   BILIRUBINUR NEGATIVE 06/07/2019 0636   KETONESUR NEGATIVE 06/07/2019 0636   PROTEINUR NEGATIVE 06/07/2019 0636   UROBILINOGEN 1.0 08/15/2015 0915   NITRITE NEGATIVE 06/07/2019 0636   LEUKOCYTESUR NEGATIVE 06/07/2019 0636   Sepsis Labs: @LABRCNTIP (procalcitonin:4,lacticidven:4)  ) Recent Results (from the past 240 hour(s))  Urine Culture     Status: None   Collection Time: 05/31/19  2:26 PM   Specimen: Urine, Catheterized  Result Value Ref Range Status   Specimen Description   Final    URINE, CATHETERIZED  Performed at [facility], [place] [address] [place], [place] [address]    Special Requests   Final    NONE Performed at [facility], [place] [address], [place], [place] [address]    Culture   Final    NO GROWTH Performed at [facility], [place] [address], [place], [place] [address]    Report Status 06/01/2019 FINAL  Final  Gastrointestinal Panel by PCR , Stool     Status: None   Collection Time: 06/01/19  1:44 PM   Specimen: Stool  Result Value Ref Range Status   Campylobacter species NOT DETECTED NOT DETECTED Final   Plesimonas shigelloides NOT DETECTED NOT DETECTED Final   Salmonella species NOT DETECTED NOT DETECTED Final   Yersinia enterocolitica NOT DETECTED NOT DETECTED Final   Vibrio species NOT DETECTED NOT DETECTED Final   Vibrio cholerae NOT DETECTED NOT DETECTED Final   Enteroaggregative E coli (EAEC) NOT DETECTED NOT DETECTED Final   Enteropathogenic E coli (EPEC) NOT DETECTED NOT  DETECTED Final   Enterotoxigenic E coli (ETEC) NOT DETECTED NOT DETECTED Final   Shiga like toxin producing E coli (STEC) NOT DETECTED NOT DETECTED Final   Shigella/Enteroinvasive E coli (EIEC) NOT DETECTED NOT DETECTED Final   Cryptosporidium NOT DETECTED NOT DETECTED Final   Cyclospora cayetanensis NOT DETECTED NOT DETECTED Final   Entamoeba histolytica NOT DETECTED NOT DETECTED Final   Giardia lamblia NOT DETECTED NOT DETECTED Final   Adenovirus F40/41 NOT DETECTED NOT DETECTED Final   Astrovirus NOT DETECTED NOT DETECTED Final   Norovirus GI/GII NOT DETECTED NOT DETECTED Final   Rotavirus A NOT DETECTED NOT DETECTED Final   Sapovirus (I, II, IV, and V) NOT DETECTED NOT DETECTED Final    Comment: Performed at [facility], [address] [place], [place] [address]      Studies: No results found.  Scheduled Meds: . allopurinol  100 mg Oral Daily  . famotidine  20 mg Oral q1800  . insulin aspart  0-9 Units Subcutaneous Q4H  .  mometasone-formoterol  2 puff Inhalation BID  . pantoprazole  40 mg Oral BID  . sodium chloride flush  3 mL Intravenous Q12H  . thiamine  100 mg Oral Daily    Continuous Infusions: . sodium chloride 250 mL (06/09/19 1308)  . diltiazem (CARDIZEM) infusion 5 mg/hr (06/09/19 1057)     LOS: 9 days     Annita Brod, MD [facility]  To reach me or the doctor on call, go to: [email or web address] Password [facility]  06/09/2019, 3:47 PM

## 2019-06-09 NOTE — Progress Notes (Signed)
Pharmacy Antibiotic Note  Kathleen [name] is a 73 y.o. female admitted on 05/30/2019 with aspiration PNA.  Pharmacy has been consulted for cefepime dosing.  Plan:  Cefepime 2g IV q24   Height: 5\' 4"  (162.6 cm) Weight: 199 lb 1.2 oz (90.3 kg) IBW/kg (Calculated) : 54.7  Temp (24hrs), Avg:97.8 F (36.6 C), Min:97.5 F (36.4 C), Max:98 F (36.7 C)  Recent Labs  Lab 06/04/19 0418 06/05/19 0528 06/07/19 0412 06/08/19 0400 06/08/19 1449 06/08/19 1819 06/08/19 2112 06/09/19 0417 06/09/19 1615  WBC 6.8 8.7 14.4* 15.9*  --   --   --  16.9*  --   CREATININE 3.21* 3.36* 3.46* 3.68*  --   --   --  3.89*  --   LATICACIDVEN  --   --   --   --  2.6* 2.2* 2.0*  --  2.2*    Estimated Creatinine Clearance: 13.8 mL/min (A) (by C-G formula based on SCr of 3.89 mg/dL (H)).    Allergies  Allergen Reactions  . Penicillins Hives and Shortness Of Breath    Tolerates Ceftin, Ceftriaxone, and Cefepime Has patient had a PCN reaction causing immediate rash, facial/tongue/throat swelling, SOB or lightheadedness with hypotension: Y Has patient had a PCN reaction causing severe rash involving mucus membranes or skin necrosis: Y Has patient had a PCN reaction that required hospitalization: Y Has patient had a PCN reaction occurring within the last 10 years: N     Thank you for allowing pharmacy to be a part of this patient's care.  Kara Mead 06/09/2019 6:34 PM

## 2019-06-09 NOTE — Progress Notes (Signed)
Patient BP 89/52; HR in the 110s.  Dr. Maryland Pink informed earlier of patient's soft BPs; patient currently receiving 500 ml saline bolus.  Patient denies lightheadedness, fatigue, and remains alert and oriented.  Will continue to monitor.

## 2019-06-09 NOTE — Progress Notes (Signed)
  Echocardiogram 2D Echocardiogram has been performed.  Kathleen [name] 06/09/2019, 3:01 PM

## 2019-06-09 NOTE — Progress Notes (Signed)
Notified Kamna-RN of patient's lactic acid of 2.2 and she stated she will notify MD.

## 2019-06-09 NOTE — Progress Notes (Signed)
Echo attempted at 8:55 am. Patient HR running between 113-128. Will attempt once HR remains under 120 bpm.

## 2019-06-09 NOTE — Progress Notes (Signed)
Patient BP borderline per cardizem orders at 05/11 systolic.  Patient also complaining of seeing 'gray explosions everywhere' since beginning of shift, prior to starting cardizem drip.  Patient had not eaten anything today, CBG was 87 at midday check, but gave patient some juice and she stated she was not seeing the 'gray lines' at this time, but that they do come and go.  Patient denies any fatigue or lightheadedness, is alert and oriented at this time.  Dr. Maryland Pink on floor rounding, verbally informed and to see patient.  Will continue to monitor.

## 2019-06-10 LAB — BASIC METABOLIC PANEL
Anion gap: 14 (ref 5–15)
BUN: 114 mg/dL — ABNORMAL HIGH (ref 8–23)
CO2: 16 mmol/L — ABNORMAL LOW (ref 22–32)
Calcium: 9.2 mg/dL (ref 8.9–10.3)
Chloride: 111 mmol/L (ref 98–111)
Creatinine, Ser: 4.04 mg/dL — ABNORMAL HIGH (ref 0.44–1.00)
GFR calc Af Amer: 12 mL/min — ABNORMAL LOW (ref >60)
GFR calc non Af Amer: 10 mL/min — ABNORMAL LOW (ref >60)
Glucose, Bld: 109 mg/dL — ABNORMAL HIGH (ref 70–99)
Potassium: 4.8 mmol/L (ref 3.5–5.1)
Sodium: 141 mmol/L (ref 135–145)

## 2019-06-10 LAB — URINALYSIS, ROUTINE W REFLEX MICROSCOPIC
Bilirubin Urine: NEGATIVE
Glucose, UA: NEGATIVE mg/dL
Hgb urine dipstick: NEGATIVE
Ketones, ur: NEGATIVE mg/dL
Nitrite: NEGATIVE
Protein, ur: NEGATIVE mg/dL
Specific Gravity, Urine: 1.017 (ref 1.005–1.030)
pH: 5 (ref 5.0–8.0)

## 2019-06-10 LAB — CBC
HCT: 30.4 % — ABNORMAL LOW (ref 36.0–46.0)
Hemoglobin: 9.5 g/dL — ABNORMAL LOW (ref 12.0–15.0)
MCH: 25.1 pg — ABNORMAL LOW (ref 26.0–34.0)
MCHC: 31.3 g/dL (ref 30.0–36.0)
MCV: 80.2 fL (ref 80.0–100.0)
Platelets: 92 10*3/uL — ABNORMAL LOW (ref 150–400)
RBC: 3.79 MIL/uL — ABNORMAL LOW (ref 3.87–5.11)
RDW: 27.7 % — ABNORMAL HIGH (ref 11.5–15.5)
WBC: 15.7 10*3/uL — ABNORMAL HIGH (ref 4.0–10.5)
nRBC: 1.9 % — ABNORMAL HIGH (ref 0.0–0.2)

## 2019-06-10 LAB — GLUCOSE, CAPILLARY
Glucose-Capillary: 102 mg/dL — ABNORMAL HIGH (ref 70–99)
Glucose-Capillary: 106 mg/dL — ABNORMAL HIGH (ref 70–99)
Glucose-Capillary: 114 mg/dL — ABNORMAL HIGH (ref 70–99)
Glucose-Capillary: 117 mg/dL — ABNORMAL HIGH (ref 70–99)
Glucose-Capillary: 124 mg/dL — ABNORMAL HIGH (ref 70–99)
Glucose-Capillary: 125 mg/dL — ABNORMAL HIGH (ref 70–99)

## 2019-06-10 LAB — BRAIN NATRIURETIC PEPTIDE: B Natriuretic Peptide: 408.8 pg/mL — ABNORMAL HIGH (ref 0.0–100.0)

## 2019-06-10 LAB — LACTIC ACID, PLASMA: Lactic Acid, Venous: 1.9 mmol/L (ref 0.5–1.9)

## 2019-06-10 MED ORDER — METOPROLOL TARTRATE 25 MG PO TABS
25.0000 mg | ORAL_TABLET | Freq: Two times a day (BID) | ORAL | Status: DC
  Administered 2019-06-10 (×2): 25 mg via ORAL
  Filled 2019-06-10 (×3): qty 1

## 2019-06-10 MED ORDER — FUROSEMIDE 10 MG/ML IJ SOLN
10.0000 mg | Freq: Two times a day (BID) | INTRAMUSCULAR | Status: DC
  Administered 2019-06-10 – 2019-06-11 (×2): 10 mg via INTRAVENOUS
  Filled 2019-06-10 (×2): qty 2

## 2019-06-10 NOTE — Progress Notes (Signed)
SLP Cancellation Note  Patient Details Name: Kathleen [name] MRN: [id number] DOB: 1946-07-29   Cancelled treatment:       Reason Eval/Treat Not Completed: Other (comment) Order received for swallow evaluation. Note that SLP evaluated pt on 6/26 with swallow appearing functional. SLP had since signed off. Per MD, will therefore disregard new order. Please reconsult with any acute concerns.   Venita Sheffield Azhar Yogi 06/10/2019, 7:36 AM  Pollyann Glen, M.A. [place] Acute Environmental education officer [phone] Office [phone]

## 2019-06-10 NOTE — Progress Notes (Signed)
MD notified about low BP.  Orders received, will continue to monitor.

## 2019-06-10 NOTE — Progress Notes (Signed)
PROGRESS NOTE  Kathleen [name] QQV:[id number] DOB: [birth year] DOA: 05/30/2019 PCP: Nolene Ebbs, MD  HPI/Recap of past 24 hours:  Kathleen [name] is a 73 y.o. year old female with medical history significant for hypertension, diastolic heart failure, chronic kidney disease a stage IV, coronary artery disease, diabetes, paroxysmal A. fib history of GI bleed who presented on 05/30/2019 with persistent nausea vomiting abdominal pain and diarrhea x 2 weeks as well as dysphagia to solids and liquids and was found to have diverticulitis, mild pancreatitis, AKI on CKD and acute metabolic encephlopathy.  Patient's hospital course complicated by episodes of rapid A. fib causing persistent hypotension and poor p.o. intake despite 7-day course treating her diverticulitis and resolution of her pancreatitis.  Patient underwent echocardiogram 6/27 which was unrevealing.  Started on low-dose Cardizem drip and given bolus of IV fluids.  On evening of 6/27, procalcitonin level markedly elevated at 8.24.  Started on cefepime  Overnight, no events.  Today, patient needing less control of heart rate and Cardizem able to be weaned off and started on low-dose oral metoprolol.  Blood pressure somewhat improved.  BNP checked and found to be mildly elevated at 400.  Creatinine does continue to worsen.  Assessment/Plan: Principal Problem:   Dysphagia Active Problems:   Essential hypertension   Diabetes mellitus type 2, controlled (HCC)   HLD (hyperlipidemia)   CKD (chronic kidney disease) stage 4, GFR 15-29 ml/min (HCC)   Chronic diastolic CHF (congestive heart failure) (HCC)   Atrial fibrillation, chronic   Abnormal LFTs   GI bleed   Idiopathic acute pancreatitis without infection or necrosis   Diverticulitis of large intestine without perforation or abscess with bleeding   Hypotension   Atrial fibrillation with RVR ([place]) 1. Leukocytosis with presumed healthcare associated pneumonia.    Noted elevated  procalcitonin level, so started on cefepime.  White blood cell count slightly better today.  Urinalysis done a few days ago unremarkable.  Chest x-ray notes persistent consolidation.  We will go ahead and check procalcitonin level.   3. Chronic A. fib, with intermittent RVR with secondary hypotension.  Intermittently having elevated heart rate > 110 in review of telemetry, EKG shows persistent A. fib   not on anticoagulation because of prior history of GI bleed.    Did not respond much to fluid boluses.  Started Cardizem drip on 6/27 able to be weaned off on 6/28.  P.o. metoprolol restarted.  Her hypotension has been difficult to manage. 4.  5. Acute mild descending colon diverticulitis/pancreatitis.  Completed 7-day course of Cipro and Flagyl not having any abdominal pain though still has minimal appetite.  Abdominal x-ray shows gaseous distention with patient having normal BMs as of today. Unclear etiology of pancreatitis  4. AKI in the setting ofCKD, Stage 4, oliguric. Stable at baseline (3.1-3.8) slightly worsening BUN, urine output remains less than 0.3 mix per cake per hour, after discussion with with Dr. Jonnie Finner from nephrology will decrease rate of IV fluids from 150 cc, closely monitor output/BUN/creatinine, formal consult to come.  By reviewing her history, has received almost 24 hours of IV fluids for hypotension, avoid nephroxtoxins, monitor output and daily BMP. Brief insufficiency related to dehydration from diarrhea/diminished PO intake on admission that resolved with IV fluids.  May be secondary to hypotension.  Trying fluid bolus.  Nephrology to see.  Continues to worsen although some of this may have been due to poor perfusion and decreased renal flow from hypotension.  Have restarted IV Lasix in  hopes that will improve her kidney function  5. Urine discoloration. Seems to be dark likely from dehydration, UA shows many bacteria but no leukocytes and asymptomatic.  continue IVF    6. Obesity: Patient meets criteria with BMI greater than 30  7. Slurred speech. Sister reports it has slowly improved over the last few days. No other deficits. CT head on 6/18 neg. Will monitor if worsens consider MRI. Could be related to not having dentures  8. Acute metabolic encephalopathy, resolved. Suspect related to narcotics earlier in hospital as well as acute illness, and electrolyte imbalances (hypernatremia) given negative CT head/MRI. Was unable to tolerate lactulose for presumed hepatic encephalophaty (based on asterixis, ammonial was wnl).  Resolved  9. Dysphagia. Speech evaluated and rec'd dysphagia 3 diet, but now having diminished appetite, denies any dysphasia or nausea  10. Hypernatremia. Resolved with IVF  11. Metabolic acidosis. In setting of CKD. Resolved with bicarb gtt.   12. COPD. Stable. On home inhalers  13. Chronic Thrombocytopenia. Stable(80s-90s) no bleeding, daily CBC  14. Hyperbilirubinemia and mild transaminitis, trending down.  T bili peaked at 5.6--4.5--3.9 .  ALT 90. Unclear etiology. No abdominal pain, hepatitis panel unremarkable, has history of mild pulmonary htn, will repeat TTE here ?  Hepatic congestion, consider abd ultrasound if TTE unremarkable? trend CMP  15. Hypertension.  BiDil has been on hold since admission, will continue given new hypotension  16. Acute on chronic diastolic CHF with preserved EF.  Slightly volume overloaded.  Will start some low-dose Lasix.  17. Vitamin B12 deficiency. S/p IV repletion on oral replacement currently.   18. Type 2 DM.  A1c 6.3, CBGs in the teens-120s with SSI  19. Hyperlipidemia. Held pravastatin since slight elevation of LFT  20. History of GI bleed ( angiodysplasia). Home protonix, hgb stable, close monitoring  21. Gout, stable. Home allopurinol  22. Stage 2 Pressure injury to sacrum, Present on admission. Local care.   Physical Deconditioning. PT recs SNF. Unable to even  transfer to bed to chair without max assistance ( different from baseline) Family and patient agreeable. S/W consulted  Code Status: Full code  Family Communication: Updated daughter by phone   Disposition Plan: Discharge to skilled nursing once because of leukocytosis decided upon and heart rate from atrial fibrillation and blood pressure stabilized   Consultants:  Gastroenterology  Cardiology  Nephrology: Pending  Procedures:  Echocardiogram done 6/27 with preserved ejection fraction and indeterminant diastolic dysfunction.  Moderate mitral regurg  Antimicrobials:  Completed 7-day course of Cipro and Flagyl  IV cefepime 6/27-present  DVT prophylaxis: SCDs   Objective: Vitals:   06/10/19 1152 06/10/19 1444  BP: (!) 84/56 (!) 100/53  Pulse: 90 (!) 102  Resp:  17  Temp:  97.6 F (36.4 C)  SpO2:  100%    Intake/Output Summary (Last 24 hours) at 06/10/2019 1641 Last data filed at 06/10/2019 0653 Gross per 24 hour  Intake 1192.92 ml  Output 225 ml  Net 967.92 ml   Filed Weights   06/07/19 0500 06/08/19 0500 06/10/19 0630  Weight: 98.1 kg 90.3 kg 95.6 kg   Body mass index is 36.18 kg/m.  Exam:   General: Alert and oriented x2, no acute distress, looks fatigued  HEENT: Normocephalic and atraumatic, mucous membranes slightly dry  Neck: Supple, no JVD  Cardiovascular: Irregular rhythm, borderline tachycardia  Respiratory: Poor inspiratory effort, otherwise clear, decreased at bases  Abdomen: Soft, nontender, mild distention, hypoactive bowel sounds  Musculoskeletal: No clubbing or cyanosis, trace pitting  edema  Skin: No skin breaks, tears or lesions, trace pitting edema  Neuro: No focal deficits  Psychiatry: Appropriate, no evidence of psychoses   Data Reviewed: CBC: Recent Labs  Lab 06/05/19 0528 06/07/19 0412 06/08/19 0400 06/09/19 0417 06/10/19 0435  WBC 8.7 14.4* 15.9* 16.9* 15.7*  HGB 10.5* 10.1* 9.9* 10.1* 9.5*  HCT 32.6* 32.5*  31.1* 32.7* 30.4*  MCV 76.3* 78.9* 78.5* 80.0 80.2  PLT 80* 89* 99* 95* 92*   Basic Metabolic Panel: Recent Labs  Lab 06/05/19 0528 06/07/19 0412 06/08/19 0400 06/09/19 0417 06/10/19 0435  NA 143 142 142 142 141  K 3.8 4.4 4.7 5.1 4.8  CL 107 108 109 111 111  CO2 19* 20* 18* 18* 16*  GLUCOSE 120* 123* 119* 101* 109*  BUN 86* 93* 104* 84* 114*  CREATININE 3.36* 3.46* 3.68* 3.89* 4.04*  CALCIUM 7.3* 9.1 9.8 9.8 9.2   GFR: Estimated Creatinine Clearance: 13.7 mL/min (A) (by C-G formula based on SCr of 4.04 mg/dL (H)). Liver Function Tests: Recent Labs  Lab 06/04/19 0418 06/05/19 0528 06/07/19 0412 06/08/19 0400 06/09/19 0417  AST 32 41 34 32 33  ALT 38 59* 83* 90* 92*  ALKPHOS 60 68 69 68 69  BILITOT 5.0* 5.4* 4.5* 3.9* 3.8*  PROT 5.5* 5.9* 5.5* 5.3* 5.6*  ALBUMIN 2.5* 2.7* 2.6* 2.5* 2.7*   No results for input(s): LIPASE, AMYLASE in the last 168 hours. No results for input(s): AMMONIA in the last 168 hours. Coagulation Profile: No results for input(s): INR, PROTIME in the last 168 hours. Cardiac Enzymes: No results for input(s): CKTOTAL, CKMB, CKMBINDEX, TROPONINI in the last 168 hours. BNP (last 3 results) No results for input(s): PROBNP in the last 8760 hours. HbA1C: No results for input(s): HGBA1C in the last 72 hours. CBG: Recent Labs  Lab 06/09/19 1952 06/09/19 2358 06/10/19 0355 06/10/19 0749 06/10/19 1149  GLUCAP 117* 114* 117* 102* 106*   Lipid Profile: No results for input(s): CHOL, HDL, LDLCALC, TRIG, CHOLHDL, LDLDIRECT in the last 72 hours. Thyroid Function Tests: No results for input(s): TSH, T4TOTAL, FREET4, T3FREE, THYROIDAB in the last 72 hours. Anemia Panel: No results for input(s): VITAMINB12, FOLATE, FERRITIN, TIBC, IRON, RETICCTPCT in the last 72 hours. Urine analysis:    Component Value Date/Time   COLORURINE AMBER (A) 06/07/2019 0636   APPEARANCEUR CLEAR 06/07/2019 0636   LABSPEC 1.014 06/07/2019 0636   PHURINE 5.0 06/07/2019  0636   GLUCOSEU NEGATIVE 06/07/2019 0636   HGBUR SMALL (A) 06/07/2019 0636   BILIRUBINUR NEGATIVE 06/07/2019 0636   KETONESUR NEGATIVE 06/07/2019 0636   PROTEINUR NEGATIVE 06/07/2019 0636   UROBILINOGEN 1.0 08/15/2015 0915   NITRITE NEGATIVE 06/07/2019 0636   LEUKOCYTESUR NEGATIVE 06/07/2019 0636   Sepsis Labs: @LABRCNTIP (procalcitonin:4,lacticidven:4)  ) Recent Results (from the past 240 hour(s))  Gastrointestinal Panel by PCR , Stool     Status: None   Collection Time: 06/01/19  1:44 PM   Specimen: Stool  Result Value Ref Range Status   Campylobacter species NOT DETECTED NOT DETECTED Final   Plesimonas shigelloides NOT DETECTED NOT DETECTED Final   Salmonella species NOT DETECTED NOT DETECTED Final   Yersinia enterocolitica NOT DETECTED NOT DETECTED Final   Vibrio species NOT DETECTED NOT DETECTED Final   Vibrio cholerae NOT DETECTED NOT DETECTED Final   Enteroaggregative E coli (EAEC) NOT DETECTED NOT DETECTED Final   Enteropathogenic E coli (EPEC) NOT DETECTED NOT DETECTED Final   Enterotoxigenic E coli (ETEC) NOT DETECTED NOT DETECTED Final  Shiga like toxin producing E coli (STEC) NOT DETECTED NOT DETECTED Final   Shigella/Enteroinvasive E coli (EIEC) NOT DETECTED NOT DETECTED Final   Cryptosporidium NOT DETECTED NOT DETECTED Final   Cyclospora cayetanensis NOT DETECTED NOT DETECTED Final   Entamoeba histolytica NOT DETECTED NOT DETECTED Final   Giardia lamblia NOT DETECTED NOT DETECTED Final   Adenovirus F40/41 NOT DETECTED NOT DETECTED Final   Astrovirus NOT DETECTED NOT DETECTED Final   Norovirus GI/GII NOT DETECTED NOT DETECTED Final   Rotavirus A NOT DETECTED NOT DETECTED Final   Sapovirus (I, II, IV, and V) NOT DETECTED NOT DETECTED Final    Comment: Performed at [facility], [address] [place], [place] [address]  Culture, blood (Routine X 2) w Reflex to ID Panel     Status: None (Preliminary result)   Collection Time: 06/09/19  7:38 PM    Specimen: BLOOD LEFT HAND  Result Value Ref Range Status   Specimen Description   Final    BLOOD LEFT HAND Performed at [facility] [address] [place], [place] [address]    Special Requests   Final    BOTTLES DRAWN AEROBIC ONLY Blood Culture adequate volume Performed at [facility] [address] [place], [place] [address]    Culture   Final    NO GROWTH < 24 HOURS Performed at [facility] [address] [place], [place] [address]    Report Status PENDING  Incomplete  Culture, blood (Routine X 2) w Reflex to ID Panel     Status: None (Preliminary result)   Collection Time: 06/09/19  7:38 PM   Specimen: Left Antecubital; Blood  Result Value Ref Range Status   Specimen Description   Final    LEFT ANTECUBITAL Performed at [facility] [address] [place], [place] [address]    Special Requests   Final    BOTTLES DRAWN AEROBIC ONLY Blood Culture adequate volume Performed at [facility] [address], [place], [place] [address]    Culture   Final    NO GROWTH < 24 HOURS Performed at [facility] [address], [place],  [address]    Report Status PENDING  Incomplete      Studies: No results found.  Scheduled Meds:  famotidine  20 mg Oral q1800   furosemide  10 mg Intravenous BID   insulin aspart  0-9 Units Subcutaneous Q4H   metoprolol tartrate  25 mg Oral BID   mometasone-formoterol  2 puff Inhalation BID   pantoprazole  40 mg Oral BID   sodium chloride flush  3 mL Intravenous Q12H   thiamine  100 mg Oral Daily    Continuous Infusions:  sodium chloride 250 mL (06/10/19 0336)   ceFEPime (MAXIPIME) IV 2 g (06/09/19 2103)   diltiazem (CARDIZEM) infusion 5 mg/hr (06/10/19 0333)     LOS: 10 days     Annita Brod, MD [facility]  To reach me or the doctor on call, go to: [email or web address] Password [facility]  06/10/2019, 4:41 PM

## 2019-06-11 ENCOUNTER — Inpatient Hospital Stay (HOSPITAL_COMMUNITY): Payer: Medicare Other

## 2019-06-11 LAB — LACTIC ACID, PLASMA
Lactic Acid, Venous: 2.1 mmol/L (ref 0.5–1.9)
Lactic Acid, Venous: 1.8 mmol/L (ref 0.5–1.9)
Lactic Acid, Venous: 1.5 mmol/L (ref 0.5–1.9)

## 2019-06-11 LAB — COMPREHENSIVE METABOLIC PANEL
ALT: 93 U/L — ABNORMAL HIGH (ref 0–44)
AST: 40 U/L (ref 15–41)
Albumin: 2.7 g/dL — ABNORMAL LOW (ref 3.5–5.0)
Alkaline Phosphatase: 73 U/L (ref 38–126)
Anion gap: 17 — ABNORMAL HIGH (ref 5–15)
BUN: 116 mg/dL — ABNORMAL HIGH (ref 8–23)
CO2: 15 mmol/L — ABNORMAL LOW (ref 22–32)
Calcium: 9.4 mg/dL (ref 8.9–10.3)
Chloride: 110 mmol/L (ref 98–111)
Creatinine, Ser: 4.36 mg/dL — ABNORMAL HIGH (ref 0.44–1.00)
GFR calc Af Amer: 11 mL/min — ABNORMAL LOW (ref >60)
GFR calc non Af Amer: 9 mL/min — ABNORMAL LOW (ref >60)
Glucose, Bld: 117 mg/dL — ABNORMAL HIGH (ref 70–99)
Potassium: 4.9 mmol/L (ref 3.5–5.1)
Sodium: 142 mmol/L (ref 135–145)
Total Bilirubin: 4.1 mg/dL — ABNORMAL HIGH (ref 0.3–1.2)
Total Protein: 6 g/dL — ABNORMAL LOW (ref 6.5–8.1)

## 2019-06-11 LAB — CBC
HCT: 32.7 % — ABNORMAL LOW (ref 36.0–46.0)
Hemoglobin: 10.3 g/dL — ABNORMAL LOW (ref 12.0–15.0)
MCH: 25.4 pg — ABNORMAL LOW (ref 26.0–34.0)
MCHC: 31.5 g/dL (ref 30.0–36.0)
MCV: 80.5 fL (ref 80.0–100.0)
Platelets: 97 10*3/uL — ABNORMAL LOW (ref 150–400)
RBC: 4.06 MIL/uL (ref 3.87–5.11)
RDW: 27.9 % — ABNORMAL HIGH (ref 11.5–15.5)
WBC: 15.2 10*3/uL — ABNORMAL HIGH (ref 4.0–10.5)
nRBC: 2.2 % — ABNORMAL HIGH (ref 0.0–0.2)

## 2019-06-11 LAB — URINE CULTURE

## 2019-06-11 LAB — GLUCOSE, CAPILLARY
Glucose-Capillary: 107 mg/dL — ABNORMAL HIGH (ref 70–99)
Glucose-Capillary: 112 mg/dL — ABNORMAL HIGH (ref 70–99)
Glucose-Capillary: 114 mg/dL — ABNORMAL HIGH (ref 70–99)
Glucose-Capillary: 115 mg/dL — ABNORMAL HIGH (ref 70–99)
Glucose-Capillary: 122 mg/dL — ABNORMAL HIGH (ref 70–99)
Glucose-Capillary: 137 mg/dL — ABNORMAL HIGH (ref 70–99)
Glucose-Capillary: 137 mg/dL — ABNORMAL HIGH (ref 70–99)

## 2019-06-11 LAB — PROCALCITONIN: Procalcitonin: 16.6 ng/mL

## 2019-06-11 MED ORDER — SODIUM CHLORIDE 0.9 % IV BOLUS
500.0000 mL | Freq: Once | INTRAVENOUS | Status: AC
  Administered 2019-06-12: 500 mL via INTRAVENOUS

## 2019-06-11 MED ORDER — CHLORHEXIDINE GLUCONATE CLOTH 2 % EX PADS
6.0000 | MEDICATED_PAD | Freq: Every day | CUTANEOUS | Status: DC
  Administered 2019-06-12 – 2019-06-14 (×3): 6 via TOPICAL

## 2019-06-11 MED ORDER — SODIUM CHLORIDE 0.9 % IV BOLUS
500.0000 mL | Freq: Once | INTRAVENOUS | Status: AC
  Administered 2019-06-11: 21:00:00 500 mL via INTRAVENOUS

## 2019-06-11 MED ORDER — ALBUMIN HUMAN 25 % IV SOLN
12.5000 g | Freq: Once | INTRAVENOUS | Status: AC
  Administered 2019-06-12: 12.5 g via INTRAVENOUS
  Filled 2019-06-11: qty 50

## 2019-06-11 MED ORDER — METOPROLOL TARTRATE 12.5 MG HALF TABLET
12.5000 mg | ORAL_TABLET | Freq: Two times a day (BID) | ORAL | Status: DC
  Administered 2019-06-11 – 2019-06-13 (×3): 12.5 mg via ORAL
  Filled 2019-06-11 (×6): qty 1

## 2019-06-11 MED ORDER — ORAL CARE MOUTH RINSE
15.0000 mL | Freq: Two times a day (BID) | OROMUCOSAL | Status: DC
  Administered 2019-06-11 – 2019-06-14 (×5): 15 mL via OROMUCOSAL

## 2019-06-11 MED ORDER — STERILE WATER FOR INJECTION IV SOLN
INTRAVENOUS | Status: DC
  Administered 2019-06-11 – 2019-06-13 (×3): via INTRAVENOUS
  Filled 2019-06-11 (×7): qty 850

## 2019-06-11 MED ORDER — SODIUM CHLORIDE 0.9 % IV BOLUS
1000.0000 mL | INTRAVENOUS | Status: AC
  Administered 2019-06-11: 1000 mL via INTRAVENOUS

## 2019-06-11 MED ORDER — IOHEXOL 300 MG/ML  SOLN: 30.0000 mL | Freq: Once | INTRAMUSCULAR | Status: DC | PRN

## 2019-06-11 NOTE — Progress Notes (Signed)
CRITICAL VALUE ALERT  Critical Value: 2.1 lactic acid  Date & Time Notied:  06/11/2019 0615  Provider Notified: Yes  C Bodenheimer  Orders Received/Actions taken: Awaiting orders

## 2019-06-11 NOTE — Progress Notes (Addendum)
Family at bedside. State pt's mentation is different and speech has changed.  Md aware and orders obtained. Eulas Post, RN

## 2019-06-11 NOTE — TOC Progression Note (Signed)
Transition of Care [facility]) - Progression Note    Patient Details  Name: Kathleen [name] MRN: [id number] Date of Birth: September 03, 1946  Transition of Care [facility]) CM/SW Contact  Joaquin Courts, RN Phone Number: 06/11/2019, 4:14 PM  Clinical Narrative: CM spoke with patient and daughter Kathleen [name] at bedside. Bed offers presented. Kathleen [name] states she will discuss everything with patient and her other siblings tonight. CM will follow-up tomorrow with Kathleen [name] for SNF choice.        Expected Discharge Plan: [place] Barriers to Discharge: Continued Medical Work up  Expected Discharge Plan and Services Expected Discharge Plan: [place] In-house Referral: Clinical Social Work   Post Acute Care Choice: [place] arrangements for the past 2 months: [place] Expected Discharge Date: (unknown)                           [place] Agency: (Advance) Date HH Agency Contacted: 06/05/19 Time [place]: 1658 Representative spoke with at [place]: [place] ([place]) Interventions    Readmission Risk Interventions No flowsheet data found.

## 2019-06-11 NOTE — NC FL2 (Signed)
[place] LEVEL OF CARE SCREENING TOOL     IDENTIFICATION  Patient Name: Kathleen [name] Birthdate: 11/28/1946 Sex: female Admission Date (Current Location): 05/30/2019  [facility] and [place] Number:  Herbalist and Address:  [facility],  [place] [address], [place]      Provider Number: [id number]  Attending Physician Name and Address:  Antonieta Pert, MD  Relative Name and Phone Number:  Martin Majestic [id number],[phone]    Current Level of Care: Hospital Recommended Level of Care: [place] Prior Approval Number:    Date Approved/Denied:   PASRR Number: [id number]  Discharge Plan: SNF    Current Diagnoses: Patient Active Problem List   Diagnosis Date Noted  . Hypotension 06/08/2019  . Atrial fibrillation with RVR ([place]) 06/08/2019  . Idiopathic acute pancreatitis without infection or necrosis   . Diverticulitis of large intestine without perforation or abscess with bleeding   . Dysphagia 05/30/2019  . AVM (arteriovenous malformation) of duodenum, acquired   . AVM (arteriovenous malformation) of stomach, acquired with hemorrhage   . AKI (acute kidney injury) ([place]) 02/02/2019  . Symptomatic anemia 12/19/2018  . GIB (gastrointestinal bleeding) 09/12/2018  . GI bleed 09/11/2018  . Malnutrition of moderate degree 06/26/2018  . Pressure injury of skin 06/26/2018  . Hypotension due to hypovolemia 06/22/2018  . Acute metabolic encephalopathy 88/28/0034  . Transaminitis 06/22/2018  . Anemia due to GI blood loss 06/22/2018  . Epigastric pain 06/22/2018  . Decreased oral intake 06/22/2018  . Hypovolemic shock ([place]) 06/22/2018  . Duodenitis 06/07/2018  . Chronic gastritis 06/07/2018  . Abnormal LFTs   . Upper GI bleed   . Melena 06/04/2018  . Atrial fibrillation, chronic 06/04/2018  . SOB (shortness of breath) 03/08/2018  . SIRS (systemic inflammatory response syndrome) ([place]) 12/04/2017  . Hypothermia 12/04/2017  .  Elevated LFTs 12/04/2017  . Sepsis ([place]) 12/04/2017  . Atrial flutter ([place]) 03/17/2015  . NSTEMI (non-ST elevated myocardial infarction) ([place])   . Chronic diastolic CHF (congestive heart failure) ([place])   . Type 2 diabetes mellitus with hypoglycemia without coma ([place])   . Paroxysmal atrial fibrillation ([place]) 01/25/2015  . Non-ST elevation MI (NSTEMI) ([place]) 01/23/2015  . Diabetes mellitus type 2, controlled ([place]) 01/23/2015  . HLD (hyperlipidemia)   . CKD (chronic kidney disease) stage 4, GFR 15-29 ml/min (HCC)   . Acute congestive heart failure ([place]) 09/17/2014  . Asthma 03/26/2014  . GERD (gastroesophageal reflux disease) 03/26/2014  . Smoker 03/26/2014  . Postmenopausal bleeding 01/10/2014  . Carotid stenosis 01/04/2012  . Carpal tunnel syndrome on left 11/26/2011  . Transient ischemic attack on medication 11/02/2011  . Hyperlipidemia with target LDL less than 70 09/17/2008  . OBESITY 09/17/2008  . Essential hypertension 09/17/2008  . Coronary atherosclerosis 09/17/2008    Orientation RESPIRATION BLADDER Height & Weight     Self, Time, Situation, Place  Normal Incontinent Weight: 97.7 kg Height:  5\' 4"  (162.6 cm)  BEHAVIORAL SYMPTOMS/MOOD NEUROLOGICAL BOWEL NUTRITION STATUS      Incontinent Diet  AMBULATORY STATUS COMMUNICATION OF NEEDS Skin   Extensive Assist Verbally Normal                       Personal Care Assistance Level of Assistance  Bathing, Dressing Bathing Assistance: Maximum assistance Feeding assistance: Independent Dressing Assistance: Maximum assistance     Functional Limitations Info  Sight, Hearing, Speech Sight Info: Adequate Hearing Info: Adequate Speech Info: Adequate    SPECIAL  CARE FACTORS FREQUENCY  PT (By licensed PT), OT (By licensed OT)     PT Frequency: 5x a week OT Frequency: 5x a week            Contractures Contractures Info: Not present    Additional Factors Info  Code Status Code Status Info: full Allergies Info:  penicillins           Current Medications (06/11/2019):  This is the current hospital active medication list Current Facility-Administered Medications  Medication Dose Route Frequency Provider Last Rate Last Dose  . 0.9 %  sodium chloride infusion  250 mL Intravenous PRN Annita Brod, MD 10 mL/hr at 06/11/19 0200    . acetaminophen (TYLENOL) tablet 650 mg  650 mg Oral Q6H PRN Hollace Hayward K, NP   650 mg at 06/07/19 1030  . albuterol (PROVENTIL) (2.5 MG/3ML) 0.083% nebulizer solution 2.5 mg  2.5 mg Nebulization Q6H PRN Adhikari, Amrit, MD      . ceFEPIme (MAXIPIME) 2 g in sodium chloride 0.9 % 100 mL IVPB  2 g Intravenous Q24H Adrian Saran, [facility]   Stopped at 06/10/19 2054  . diltiazem (CARDIZEM) 100 mg in dextrose 5 % 100 mL (1 mg/mL) infusion  5-15 mg/hr Intravenous Titrated Annita Brod, MD   Stopped at 06/10/19 1414  . famotidine (PEPCID) tablet 20 mg  20 mg Oral q1800 Polly Cobia, RPH   20 mg at 06/10/19 1722  . furosemide (LASIX) injection 10 mg  10 mg Intravenous BID Annita Brod, MD   10 mg at 06/10/19 1721  . insulin aspart (novoLOG) injection 0-9 Units  0-9 Units Subcutaneous Q4H Gardiner Barefoot, NP   1 Units at 06/10/19 2049  . metoprolol tartrate (LOPRESSOR) tablet 25 mg  25 mg Oral BID Annita Brod, MD   25 mg at 06/10/19 2259  . mometasone-formoterol (DULERA) 100-5 MCG/ACT inhaler 2 puff  2 puff Inhalation BID Shelly Coss, MD   2 puff at 06/11/19 0733  . ondansetron (ZOFRAN) injection 4 mg  4 mg Intravenous Q6H PRN Shelly Coss, MD   4 mg at 06/02/19 2228  . pantoprazole (PROTONIX) EC tablet 40 mg  40 mg Oral BID Polly Cobia, RPH   40 mg at 06/10/19 2259  . sodium chloride flush (NS) 0.9 % injection 3 mL  3 mL Intravenous Q12H Shelly Coss, MD   3 mL at 06/10/19 2259  . sodium chloride flush (NS) 0.9 % injection 3 mL  3 mL Intravenous PRN Shelly Coss, MD   3 mL at 06/03/19 0943  . thiamine (VITAMIN B-1) tablet 100 mg  100 mg  Oral Daily Polly Cobia, RPH   100 mg at 06/10/19 3419     Discharge Medications: Please see discharge summary for a list of discharge medications.  Relevant Imaging Results:  Relevant Lab Results:   Additional Information SSN [id number]  Joaquin Courts, RN

## 2019-06-11 NOTE — Progress Notes (Signed)
PROGRESS NOTE    Kathleen [name]  PZW:[id number] DOB: Feb 21, 1946 DOA: 05/30/2019 PCP: Nolene Ebbs, MD   Brief Narrative: 73 y.o.year old femalewith medical history significant for hypertension, diastolic heart failure, chronic kidney disease a stage IV, coronary artery disease, diabetes, paroxysmal A. fib history of GI bleedwho presented on 6/17/2020with persistent nausea vomiting abdominal pain and diarrheax 2 weeks as well as dysphagia to solids and liquids and was found to have diverticulitis, mild pancreatitis, AKI on CKD and acute metabolic encephlopathy.  Patient's hospital course complicated by episodes of rapid A. fib causing persistent hypotension and poor p.o. intake despite 7-day course treating her diverticulitis and resolution of her pancreatitis.  Patient underwent echocardiogram 6/27 which was unrevealing.  Started on low-dose Cardizem drip and given bolus of IV fluids.  On evening of 6/27, procalcitonin level markedly elevated at 8.24.  Started on cefepime. Patient was weaned off Cardizem and placed on low-dose metoprolol 6/28.  She remains to have soft labile blood pressure and creatinine is uptrending.   Subjective: No acute events overnight except for episodes of soft blood pressure-running in 84/51.  Patient received 250 mL bolus.  This morning appears alert awake frail. Seen again in afternoon at 3 .30pm with daughter at bedside. She is awake, alert, appears very weak. movin UE wll, able to move both foot to sideways. BP soft in 89s  Assessment & Plan:   Leukocytosis with presumed HCAP: Patient was placed back in cefepime 6/27 with elevated procalcitonin level. WBC count remains in 15,200 today.  Patient is soft overnight needing fluids.  Currently in 423 systolic.  Lactic acid up at 2.1 this morning and has been at 1.9- 2.2 ( also liver dysfunction could be contributing) continue on IV fluid hydration and monitor labs. Cautions w ivf due to CHF.  Blood  culture/urine culture sent 6/27- negative so far. given soft BP- ordered bolus NSS I litres and ordered lactic acid-transfer to Step down unit. Ordered ct abdomen/pelvis. Recent Labs  Lab 06/07/19 0412 06/08/19 0400 06/09/19 0417 06/10/19 0435 06/11/19 0434  WBC 14.4* 15.9* 16.9* 15.7* 15.2*   Diverticulitis of large intestine without perforation/pancreatitis : Complete 7 days of Cipro/Flagyl.  Unclear etiology of pancreatitis.  On 6/17:CT showed stranding of the peripancreatic fat adjacent to the tail of the pancreas which could be due to mild acute pancreatitis. Patient denies any abdominal pain, xray with gaseous distension but having bowel movements.  Tolerating dysphagia 3 diet as per speech but not eating much.  Hypotension setting of chronic A. fib with RVR, CKD stage IV and with pneumonia as above.  With hypotension difficult to control the rate. Continue gentle IV fluids, hold Lasix today, nephrology to reevaluate the patient today.  Patient not on anticoagulation due to prior history of GI bleeding.  Patient was weaned off Cardizem drip on 6/28-then placed on metoprolol 25 mg- will cut down. giving iv boluses for soft BP.  AKI on CKD stage 4/metabolic acidosis: Baseline creatinine 3.1-3.8, BUN/creatinine uptrending. BUN at 116. Initially seen by nephrology.  Given patient's diminished oral intake and admissions diuresis with IV fluids suspecting prerenal however also having some hypotension which also contribute to her worsening renal function.  I spoke to nephrology personally with Dr. Justin Mend today to reevaluate the patient. Hold off Lasix for now as creatinine is uptrending.  hcao3 low 15 and has been in 16-20.starting bicarb gtt.ly. Recent Labs  Lab 06/07/19 0412 06/08/19 0400 06/09/19 0417 06/10/19 0435 06/11/19 0434  BUN 93* 104* 84* 114*  116*  CREATININE 3.46* 3.68* 3.89* 4.04* 4.36*   Chronic atrial fibrillation with intermittent  RVR: This morning rate acceptable.  On  low-dose metoprolol continue 12.5 mg due to hypotension with holding parameters.  Lethargy- also uremic. Is very weak, frail, barely moving her extremities. Guarded prognosis. unlikely HD candidate per Dr Justin Mend. palliative care consulted- family wishes for aggressive care.  Dysphagia: Appreciate SLP evaluation on dysphagia 3 diet.  Hypernatremia resolved with IV fluids.  Acute metabolic encephalopathy/slurred speech- today appears frail, oriented to place, people. multifactorial in the setting of opiates, underlying imbalance, she had negative work-up with CT head and MRI brain was unremarkable this admission.  Appears lethargic today could be secondary to her hypotension.  Essential hypertension: Blood pressure soft.  Diabetes mellitus type 2, controlled, hb A1c 6.3, continue sliding scale insulin.  HLD: On pravastatin at home, on hold here due to elevated LFTs.  Acute on chronic diastolic CHF: Holding Lasix for now given creatinine.  Vitamin B12 deficiency hx: check b12 level.  Abnormal LFTs : Bilirubin overall remains the same, AST normal ALT elevated. Recent Labs  Lab 06/05/19 0528 06/07/19 0412 06/08/19 0400 06/09/19 0417 06/11/19 0434  AST 41 34 32 33 40  ALT 59* 83* 90* 92* 93*  ALKPHOS 68 69 68 69 73  BILITOT 5.4* 4.5* 3.9* 3.8* 4.1*  PROT 5.9* 5.5* 5.3* 5.6* 6.0*  ALBUMIN 2.7* 2.6* 2.5* 2.7* 2.7*   GI bleed history with angiodysplasia.  Continue home Protonix.  Hemoglobin overall stable. Recent Labs  Lab 06/07/19 0412 06/08/19 0400 06/09/19 0417 06/10/19 0435 06/11/19 0434  HGB 10.1* 9.9* 10.1* 9.5* 10.3*  HCT 32.5* 31.1* 32.7* 30.4* 32.7*   Obesity with BMI at 36 Debility/physical deconditioning, continue PT OT, recommending skilled nursing facility.  Unable to transfer to bed to chair without max assistance.  Family agreeable for SNF.Social worker on board.  Addendum: Lactic acid came back at 1.8.  DVT prophylaxis: SCD Code Status: FULL Code. Family  Communication:  discussed the plan with patient-updated daughter at bedside.  Overall prognosis is guarded- I have discussed with nephrologist and family. Disposition Plan: remains inpatient pending clinical improvement.Palliative care consulted to d/w code status and GOC- at this time family desires aggressive care-multiple family visiting from outside.    Consultants:   Gastroenterology  Cardiology  Nephrology: Pending  Procedures:  Echocardiogram done 6/27 with preserved ejection fraction and indeterminant diastolic dysfunction.  Moderate mitral regurg  Procedures: CT abdomen/pelvis : 05/30/19  1. Stranding of the peripancreatic fat adjacent the tail the pancreas which could be seen due to mild acute pancreatitis. Recommend clinical correlation.  2. Diverticulosis of the descending colon with subtle adjacent stranding of the pericolonic fat as findings may be within normal although could be seen due to mild acute diverticulitis.  3. Moderate size umbilical hernia containing peritoneal fat and short segment of transverse colon. No evidence of bowel obstruction.  4. Subtle patchy opacification over the lung bases which may be due to atelectasis or early infection. Lingular atelectasis.  5.  Cardiomegaly.  Atherosclerotic coronary artery disease.  6.  Aortic Atherosclerosis (ICD10-I70.0).  7.  Slightly small kidneys.  Several stable right renal cysts.   Antimicrobials:  Completed 7-day course of Cipro and Flagyl  IV cefepime 6/27-present   Anti-infectives (From admission, onward)   Start     Dose/Rate Route Frequency Ordered Stop   06/09/19 2000  ceFEPIme (MAXIPIME) 2 g in sodium chloride 0.9 % 100 mL IVPB     2 g  200 mL/hr over 30 Minutes Intravenous Every 24 hours 06/09/19 1846     06/06/19 1800  ciprofloxacin (CIPRO) tablet 500 mg     500 mg Oral Daily-1800 06/06/19 1036 06/07/19 1824   06/06/19 1400  metroNIDAZOLE (FLAGYL) tablet 500 mg     500 mg Oral  Every 8 hours 06/06/19 1036 06/07/19 2330   06/01/19 0000  metroNIDAZOLE (FLAGYL) IVPB 500 mg  Status:  Discontinued     500 mg 100 mL/hr over 60 Minutes Intravenous Every 8 hours 05/31/19 2313 06/06/19 1036   05/31/19 2000  metroNIDAZOLE (FLAGYL) IVPB 500 mg  Status:  Discontinued     500 mg 100 mL/hr over 60 Minutes Intravenous Every 8 hours 05/31/19 1822 05/31/19 2313   05/31/19 1930  ciprofloxacin (CIPRO) IVPB 400 mg  Status:  Discontinued     400 mg 200 mL/hr over 60 Minutes Intravenous Every 24 hours 05/31/19 1822 06/06/19 1036       Objective: Vitals:   06/10/19 2058 06/11/19 0527 06/11/19 0733 06/11/19 1055  BP:    (!) 102/48  Pulse:    98  Resp:      Temp:      TempSrc:      SpO2: 99%  97%   Weight:  97.7 kg    Height:        Intake/Output Summary (Last 24 hours) at 06/11/2019 1219 Last data filed at 06/11/2019 0200 Gross per 24 hour  Intake 450.59 ml  Output 80 ml  Net 370.59 ml   Filed Weights   06/08/19 0500 06/10/19 0630 06/11/19 0527  Weight: 90.3 kg 95.6 kg 97.7 kg   Weight change: 2.1 kg  Body mass index is 36.97 kg/m.  Intake/Output from previous day: 06/28 0701 - 06/29 0700 In: 955.6 [P.O.:540; I.V.:215.6; IV Piggyback:200] Out: 80 [Urine:80] Intake/Output this shift: No intake/output data recorded.  Examination:  General exam: Appears ill, frail, calm.,  HEENT:PERRL,Oral mucosa moist, Ear/Nose normal on gross exam Respiratory system: Bilateral diminished breath sounds, no crackles or wheezing.  Cardiovascular system: Irregularly irregular rate and rhythm, S1 & S2 heard,No JVD, murmurs. Gastrointestinal system: Abdomen is  soft,non tender, non distended, BS +  Nervous System:Alert and oriented x2. No focal neurological deficits in UE. Weak b/l LE. But moving feet. sensation intact. Extremities: No edema, no clubbing, distal peripheral pulses palpable. Skin: No rashes, lesions, no icterus MSK: Normal muscle bulk,tone  ,power  Medications:  Scheduled Meds:  famotidine  20 mg Oral q1800   insulin aspart  0-9 Units Subcutaneous Q4H   metoprolol tartrate  12.5 mg Oral BID   mometasone-formoterol  2 puff Inhalation BID   pantoprazole  40 mg Oral BID   sodium chloride flush  3 mL Intravenous Q12H   thiamine  100 mg Oral Daily   Continuous Infusions:  sodium chloride 10 mL/hr at 06/11/19 0200   ceFEPime (MAXIPIME) IV Stopped (06/10/19 2054)   diltiazem (CARDIZEM) infusion Stopped (06/10/19 1414)    Data Reviewed: I have personally reviewed following labs and imaging studies  CBC: Recent Labs  Lab 06/07/19 0412 06/08/19 0400 06/09/19 0417 06/10/19 0435 06/11/19 0434  WBC 14.4* 15.9* 16.9* 15.7* 15.2*  HGB 10.1* 9.9* 10.1* 9.5* 10.3*  HCT 32.5* 31.1* 32.7* 30.4* 32.7*  MCV 78.9* 78.5* 80.0 80.2 80.5  PLT 89* 99* 95* 92* 97*   Basic Metabolic Panel: Recent Labs  Lab 06/07/19 0412 06/08/19 0400 06/09/19 0417 06/10/19 0435 06/11/19 0434  NA 142 142 142 141 142  K 4.4 4.7  5.1 4.8 4.9  CL 108 109 111 111 110  CO2 20* 18* 18* 16* 15*  GLUCOSE 123* 119* 101* 109* 117*  BUN 93* 104* 84* 114* 116*  CREATININE 3.46* 3.68* 3.89* 4.04* 4.36*  CALCIUM 9.1 9.8 9.8 9.2 9.4   GFR: Estimated Creatinine Clearance: 12.8 mL/min (A) (by C-G formula based on SCr of 4.36 mg/dL (H)). Liver Function Tests: Recent Labs  Lab 06/05/19 0528 06/07/19 0412 06/08/19 0400 06/09/19 0417 06/11/19 0434  AST 41 34 32 33 40  ALT 59* 83* 90* 92* 93*  ALKPHOS 68 69 68 69 73  BILITOT 5.4* 4.5* 3.9* 3.8* 4.1*  PROT 5.9* 5.5* 5.3* 5.6* 6.0*  ALBUMIN 2.7* 2.6* 2.5* 2.7* 2.7*   No results for input(s): LIPASE, AMYLASE in the last 168 hours. No results for input(s): AMMONIA in the last 168 hours. Coagulation Profile: No results for input(s): INR, PROTIME in the last 168 hours. Cardiac Enzymes: No results for input(s): CKTOTAL, CKMB, CKMBINDEX, TROPONINI in the last 168 hours. BNP (last 3 results) No  results for input(s): PROBNP in the last 8760 hours. HbA1C: No results for input(s): HGBA1C in the last 72 hours. CBG: Recent Labs  Lab 06/10/19 2038 06/11/19 0030 06/11/19 0338 06/11/19 0809 06/11/19 1135  GLUCAP 125* 114* 107* 115* 137*   Lipid Profile: No results for input(s): CHOL, HDL, LDLCALC, TRIG, CHOLHDL, LDLDIRECT in the last 72 hours. Thyroid Function Tests: No results for input(s): TSH, T4TOTAL, FREET4, T3FREE, THYROIDAB in the last 72 hours. Anemia Panel: No results for input(s): VITAMINB12, FOLATE, FERRITIN, TIBC, IRON, RETICCTPCT in the last 72 hours. Sepsis Labs: Recent Labs  Lab 06/08/19 2112 06/09/19 1615 06/10/19 0435 06/11/19 0434  PROCALCITON  --  8.24  --  16.60  LATICACIDVEN 2.0* 2.2* 1.9 2.1*    Recent Results (from the past 240 hour(s))  Gastrointestinal Panel by PCR , Stool     Status: None   Collection Time: 06/01/19  1:44 PM   Specimen: Stool  Result Value Ref Range Status   Campylobacter species NOT DETECTED NOT DETECTED Final   Plesimonas shigelloides NOT DETECTED NOT DETECTED Final   Salmonella species NOT DETECTED NOT DETECTED Final   Yersinia enterocolitica NOT DETECTED NOT DETECTED Final   Vibrio species NOT DETECTED NOT DETECTED Final   Vibrio cholerae NOT DETECTED NOT DETECTED Final   Enteroaggregative E coli (EAEC) NOT DETECTED NOT DETECTED Final   Enteropathogenic E coli (EPEC) NOT DETECTED NOT DETECTED Final   Enterotoxigenic E coli (ETEC) NOT DETECTED NOT DETECTED Final   Shiga like toxin producing E coli (STEC) NOT DETECTED NOT DETECTED Final   Shigella/Enteroinvasive E coli (EIEC) NOT DETECTED NOT DETECTED Final   Cryptosporidium NOT DETECTED NOT DETECTED Final   Cyclospora cayetanensis NOT DETECTED NOT DETECTED Final   Entamoeba histolytica NOT DETECTED NOT DETECTED Final   Giardia lamblia NOT DETECTED NOT DETECTED Final   Adenovirus F40/41 NOT DETECTED NOT DETECTED Final   Astrovirus NOT DETECTED NOT DETECTED Final    Norovirus GI/GII NOT DETECTED NOT DETECTED Final   Rotavirus A NOT DETECTED NOT DETECTED Final   Sapovirus (I, II, IV, and V) NOT DETECTED NOT DETECTED Final    Comment: Performed at [facility], [address] [place], [place] [address]  Culture, blood (Routine X 2) w Reflex to ID Panel     Status: None (Preliminary result)   Collection Time: 06/09/19  7:38 PM   Specimen: BLOOD LEFT HAND  Result Value Ref Range Status   Specimen  Description   Final    BLOOD LEFT HAND Performed at [facility] [address], [place], [place] [address]    Special Requests   Final    BOTTLES DRAWN AEROBIC ONLY Blood Culture adequate volume Performed at [facility] [address] [place], [place] [address]    Culture   Final    NO GROWTH 1 DAY Performed at [facility], [place] [address] [place], [place] [address]    Report Status PENDING  Incomplete  Culture, blood (Routine X 2) w Reflex to ID Panel     Status: None (Preliminary result)   Collection Time: 06/09/19  7:38 PM   Specimen: BLOOD  Result Value Ref Range Status   Specimen Description   Final    BLOOD LEFT ANTECUBITAL Performed at [facility], [place] [address], [place], [place] [address]    Special Requests   Final    BOTTLES DRAWN AEROBIC ONLY Blood Culture adequate volume Performed at [facility] [address], [place], [place] [address]    Culture   Final    NO GROWTH 1 DAY Performed at [facility], [place] [address] [place],  [address]    Report Status PENDING  Incomplete      Radiology Studies: No results found.    LOS: 11 days   Time spent: More than 50% of that time was spent in counseling and/or coordination of care.  Antonieta Pert, MD [facility]  06/11/2019, 12:19 PM

## 2019-06-11 NOTE — Progress Notes (Signed)
[facility] ROUNDING NOTE   Subjective:   This is a 73 year old lady with a history of hypertension diastolic heart failure stage IV chronic kidney disease coronary artery disease diabetes paroxysmal atrial fibrillation and GI bleed.  She presented on 05/30/2019 with nausea vomiting abdominal pain.  She developed acute on chronic kidney disease and nephrology was consulted.  They signed off on 06/04/2019 as her acute component seem to be resolving.  Her hospitalization has been complicated by rapid atrial fibrillation and hypotension 2D echo 06/09/2019 was unrevealing except for some diastolic dysfunction.  She was started on low-dose Cardizem drip and IV fluids.  Urine output 220 cc 05/21/2019 weight increased from 91.4 kg to 97.7 kg  Blood pressure 102/48 pulse 95 irregular temperature 98 O2 sats 97% room air  Sodium 142 potassium 4.9 chloride 110 CO2 15 BUN 116 creatinine 4.36 glucose 117 calcium 9.4 albumin 2.7 AST 40 ALT 93 total bilirubin 4.1 calcium 9.4 WBC 15.2 hemoglobin 10.3 platelet 97  Chest x-ray persistent left infrahilar consolidation  Meds Protonix 40 mg daily metoprolol 12.5 mg twice daily  Objective:  Vital signs in last 24 hours:  Temp:  [97.6 F (36.4 C)-97.7 F (36.5 C)] 97.7 F (36.5 C) (06/28 2053) Pulse Rate:  [72-102] 98 (06/29 1055) Resp:  [17-18] 18 (06/28 2053) BP: (100-109)/(48-53) 102/48 (06/29 1055) SpO2:  [97 %-100 %] 97 % (06/29 0733) Weight:  [97.7 kg] 97.7 kg (06/29 0527)  Weight change: 2.1 kg Filed Weights   06/08/19 0500 06/10/19 0630 06/11/19 0527  Weight: 90.3 kg 95.6 kg 97.7 kg    Intake/Output: I/O last 3 completed shifts: In: 2148.5 [P.O.:540; I.V.:1408.5; IV Piggyback:200] Out: 305 [Urine:305]   Intake/Output this shift:  No intake/output data recorded.   Alert fatigued CVS-irregularly irregular JVP not elevated RS-clear with no wheezes rales ABD- BS present soft non-distended EXT-trace pitting edema   Basic  Metabolic Panel: Recent Labs  Lab 06/07/19 0412 06/08/19 0400 06/09/19 0417 06/10/19 0435 06/11/19 0434  NA 142 142 142 141 142  K 4.4 4.7 5.1 4.8 4.9  CL 108 109 111 111 110  CO2 20* 18* 18* 16* 15*  GLUCOSE 123* 119* 101* 109* 117*  BUN 93* 104* 84* 114* 116*  CREATININE 3.46* 3.68* 3.89* 4.04* 4.36*  CALCIUM 9.1 9.8 9.8 9.2 9.4    Liver Function Tests: Recent Labs  Lab 06/05/19 0528 06/07/19 0412 06/08/19 0400 06/09/19 0417 06/11/19 0434  AST 41 34 32 33 40  ALT 59* 83* 90* 92* 93*  ALKPHOS 68 69 68 69 73  BILITOT 5.4* 4.5* 3.9* 3.8* 4.1*  PROT 5.9* 5.5* 5.3* 5.6* 6.0*  ALBUMIN 2.7* 2.6* 2.5* 2.7* 2.7*   No results for input(s): LIPASE, AMYLASE in the last 168 hours. No results for input(s): AMMONIA in the last 168 hours.  CBC: Recent Labs  Lab 06/07/19 0412 06/08/19 0400 06/09/19 0417 06/10/19 0435 06/11/19 0434  WBC 14.4* 15.9* 16.9* 15.7* 15.2*  HGB 10.1* 9.9* 10.1* 9.5* 10.3*  HCT 32.5* 31.1* 32.7* 30.4* 32.7*  MCV 78.9* 78.5* 80.0 80.2 80.5  PLT 89* 99* 95* 92* 97*    Cardiac Enzymes: No results for input(s): CKTOTAL, CKMB, CKMBINDEX, TROPONINI in the last 168 hours.  BNP: Invalid input(s): POCBNP  CBG: Recent Labs  Lab 06/10/19 2038 06/11/19 0030 06/11/19 0338 06/11/19 0809 06/11/19 1135  GLUCAP 125* 114* 107* 115* 137*    Microbiology: Results for orders placed or performed during the hospital encounter of 05/30/19  SARS Coronavirus 2 (CEPHEID -  Performed in [facility] hospital lab), Hosp Order     Status: None   Collection Time: 05/30/19  6:17 AM   Specimen: Nasopharyngeal Swab  Result Value Ref Range Status   SARS Coronavirus 2 NEGATIVE NEGATIVE Final    Comment: (NOTE) If result is NEGATIVE SARS-CoV-2 target nucleic acids are NOT DETECTED. The SARS-CoV-2 RNA is generally detectable in upper and lower  respiratory specimens during the acute phase of infection. The lowest  concentration of SARS-CoV-2 viral copies this assay  can detect is 250  copies / mL. A negative result does not preclude SARS-CoV-2 infection  and should not be used as the sole basis for treatment or other  patient management decisions.  A negative result may occur with  improper specimen collection / handling, submission of specimen other  than nasopharyngeal swab, presence of viral mutation(s) within the  areas targeted by this assay, and inadequate number of viral copies  (<250 copies / mL). A negative result must be combined with clinical  observations, patient history, and epidemiological information. If result is POSITIVE SARS-CoV-2 target nucleic acids are DETECTED. The SARS-CoV-2 RNA is generally detectable in upper and lower  respiratory specimens dur ing the acute phase of infection.  Positive  results are indicative of active infection with SARS-CoV-2.  Clinical  correlation with patient history and other diagnostic information is  necessary to determine patient infection status.  Positive results do  not rule out bacterial infection or co-infection with other viruses. If result is PRESUMPTIVE POSTIVE SARS-CoV-2 nucleic acids MAY BE PRESENT.   A presumptive positive result was obtained on the submitted specimen  and confirmed on repeat testing.  While 2019 novel coronavirus  (SARS-CoV-2) nucleic acids may be present in the submitted sample  additional confirmatory testing may be necessary for epidemiological  and / or clinical management purposes  to differentiate between  SARS-CoV-2 and other Sarbecovirus currently known to infect humans.  If clinically indicated additional testing with an alternate test  methodology [phone]) is advised. The SARS-CoV-2 RNA is generally  detectable in upper and lower respiratory sp ecimens during the acute  phase of infection. The expected result is Negative. Fact Sheet for Patients:  StrictlyIdeas.no Fact Sheet for Healthcare  Providers: BankingDealers.co.za This test is not yet approved or cleared by the [place] FDA and has been authorized for detection and/or diagnosis of SARS-CoV-2 by FDA under an Emergency Use Authorization (EUA).  This EUA will remain in effect (meaning this test can be used) for the duration of the COVID-19 declaration under Section 564(b)(1) of the Act, 21 U.S.C. section 360bbb-3(b)(1), unless the authorization is terminated or revoked sooner. Performed at [facility], [place] [address], [place], [place] [address]   Urine Culture     Status: None   Collection Time: 05/31/19  2:26 PM   Specimen: Urine, Catheterized  Result Value Ref Range Status   Specimen Description   Final    URINE, CATHETERIZED Performed at [facility] [address], [place], [place] [address]    Special Requests   Final    NONE Performed at [facility], [place] [address], [place], [place] [address]    Culture   Final    NO GROWTH Performed at [facility], [place] [address], [place], [place] [address]    Report Status 06/01/2019 FINAL  Final  Gastrointestinal Panel by PCR , Stool     Status: None   Collection Time: 06/01/19  1:44 PM  Specimen: Stool  Result Value Ref Range Status   Campylobacter species NOT DETECTED NOT DETECTED Final   Plesimonas shigelloides NOT DETECTED NOT DETECTED Final   Salmonella species NOT DETECTED NOT DETECTED Final   Yersinia enterocolitica NOT DETECTED NOT DETECTED Final   Vibrio species NOT DETECTED NOT DETECTED Final   Vibrio cholerae NOT DETECTED NOT DETECTED Final   Enteroaggregative E coli (EAEC) NOT DETECTED NOT DETECTED Final   Enteropathogenic E coli (EPEC) NOT DETECTED NOT DETECTED Final   Enterotoxigenic E coli (ETEC) NOT DETECTED NOT DETECTED Final   Shiga like toxin producing E coli (STEC) NOT DETECTED NOT DETECTED Final   Shigella/Enteroinvasive E coli (EIEC) NOT DETECTED  NOT DETECTED Final   Cryptosporidium NOT DETECTED NOT DETECTED Final   Cyclospora cayetanensis NOT DETECTED NOT DETECTED Final   Entamoeba histolytica NOT DETECTED NOT DETECTED Final   Giardia lamblia NOT DETECTED NOT DETECTED Final   Adenovirus F40/41 NOT DETECTED NOT DETECTED Final   Astrovirus NOT DETECTED NOT DETECTED Final   Norovirus GI/GII NOT DETECTED NOT DETECTED Final   Rotavirus A NOT DETECTED NOT DETECTED Final   Sapovirus (I, II, IV, and V) NOT DETECTED NOT DETECTED Final    Comment: Performed at [facility], [place] [place], [place] [address]  Culture, blood (Routine X 2) w Reflex to ID Panel     Status: None (Preliminary result)   Collection Time: 06/09/19  7:38 PM   Specimen: BLOOD LEFT HAND  Result Value Ref Range Status   Specimen Description   Final    BLOOD LEFT HAND Performed at [facility], [place] [address] [place], [place] [address]    Special Requests   Final    BOTTLES DRAWN AEROBIC ONLY Blood Culture adequate volume Performed at [facility] [address] [place], [place] [address]    Culture   Final    NO GROWTH 1 DAY Performed at [facility], [place] [address], [place], [place] [address]    Report Status PENDING  Incomplete  Culture, blood (Routine X 2) w Reflex to ID Panel     Status: None (Preliminary result)   Collection Time: 06/09/19  7:38 PM   Specimen: BLOOD  Result Value Ref Range Status   Specimen Description   Final    BLOOD LEFT ANTECUBITAL Performed at [facility], [place] [address], [place], [place] [address]    Special Requests   Final    BOTTLES DRAWN AEROBIC ONLY Blood Culture adequate volume Performed at [facility] [address], [place], [place] [address]    Culture   Final    NO GROWTH 1 DAY Performed at [facility], [place] [address], [place], [place] [address]    Report Status PENDING  Incomplete    Coagulation Studies: No  results for input(s): LABPROT, INR in the last 72 hours.  Urinalysis: Recent Labs    06/10/19 0900  COLORURINE AMBER*  LABSPEC 1.017  PHURINE 5.0  GLUCOSEU NEGATIVE  HGBUR NEGATIVE  BILIRUBINUR NEGATIVE  KETONESUR NEGATIVE  PROTEINUR NEGATIVE  NITRITE NEGATIVE  LEUKOCYTESUR TRACE*      Imaging: No results found.   Medications:   . sodium chloride 10 mL/hr at 06/11/19 0200  . ceFEPime (MAXIPIME) IV Stopped (06/10/19 2054)  . diltiazem (CARDIZEM) infusion Stopped (06/10/19 1414)   . famotidine  20 mg Oral q1800  . insulin aspart  0-9 Units Subcutaneous Q4H  . metoprolol tartrate  12.5  mg Oral BID  . mometasone-formoterol  2 puff Inhalation BID  . pantoprazole  40 mg Oral BID  . sodium chloride flush  3 mL Intravenous Q12H  . thiamine  100 mg Oral Daily   sodium chloride, acetaminophen, albuterol, ondansetron (ZOFRAN) IV, sodium chloride flush  Assessment/ Plan:   Acute on chronic kidney disease stage IV baseline about 3.13.8 with worsening BUN.  Appears to have an increased weight but no evidence of gross congestive heart failure.  We will switch IV fluids to D5W with IV bicarbonate continue to monitor.  Chronic atrial fibrillation with intermittent rapid ventricular rate rate appears to be better controlled currently weaned off Cardizem started metoprolol 12.5 mg twice daily  Acute mild descending diverticulitis treated with a 7-day course of Cipro and Flagyl  Obesity with BMI greater than 30  Acute metabolic encephalopathy appears to have resolved  Metabolic acidosis we will change to IV bicarbonate  COPD stable on inhalers  Hyperbilirubinemia and transaminitis appears to be improving  Diabetes mellitus appears to be stable  Hyperlipidemia statins have been held secondary transaminitis  Gout appears to be stable with no acute exacerbations uses allopurinol at home  History of GI bleed with angiodysplasia at home on Protonix hemoglobin is been  stable  Stage II pressure ulcer.   LOS: [place] @TODAY @2 :07 PM

## 2019-06-12 ENCOUNTER — Encounter (HOSPITAL_COMMUNITY): Payer: Self-pay | Admitting: Pulmonary Disease

## 2019-06-12 DIAGNOSIS — Z7189 Other specified counseling: Secondary | ICD-10-CM

## 2019-06-12 DIAGNOSIS — Z515 Encounter for palliative care: Secondary | ICD-10-CM

## 2019-06-12 DIAGNOSIS — I5033 Acute on chronic diastolic (congestive) heart failure: Secondary | ICD-10-CM

## 2019-06-12 DIAGNOSIS — N179 Acute kidney failure, unspecified: Secondary | ICD-10-CM

## 2019-06-12 LAB — COMPREHENSIVE METABOLIC PANEL
ALT: 83 U/L — ABNORMAL HIGH (ref 0–44)
AST: 39 U/L (ref 15–41)
Albumin: 2.8 g/dL — ABNORMAL LOW (ref 3.5–5.0)
Alkaline Phosphatase: 70 U/L (ref 38–126)
Anion gap: 15 (ref 5–15)
BUN: 117 mg/dL — ABNORMAL HIGH (ref 8–23)
CO2: 16 mmol/L — ABNORMAL LOW (ref 22–32)
Calcium: 8.8 mg/dL — ABNORMAL LOW (ref 8.9–10.3)
Chloride: 109 mmol/L (ref 98–111)
Creatinine, Ser: 4.13 mg/dL — ABNORMAL HIGH (ref 0.44–1.00)
GFR calc Af Amer: 12 mL/min — ABNORMAL LOW (ref >60)
GFR calc non Af Amer: 10 mL/min — ABNORMAL LOW (ref >60)
Glucose, Bld: 107 mg/dL — ABNORMAL HIGH (ref 70–99)
Potassium: 5.2 mmol/L — ABNORMAL HIGH (ref 3.5–5.1)
Sodium: 140 mmol/L (ref 135–145)
Total Bilirubin: 4.1 mg/dL — ABNORMAL HIGH (ref 0.3–1.2)
Total Protein: 5.9 g/dL — ABNORMAL LOW (ref 6.5–8.1)

## 2019-06-12 LAB — GLUCOSE, CAPILLARY
Glucose-Capillary: 107 mg/dL — ABNORMAL HIGH (ref 70–99)
Glucose-Capillary: 114 mg/dL — ABNORMAL HIGH (ref 70–99)
Glucose-Capillary: 129 mg/dL — ABNORMAL HIGH (ref 70–99)
Glucose-Capillary: 131 mg/dL — ABNORMAL HIGH (ref 70–99)
Glucose-Capillary: 91 mg/dL (ref 70–99)
Glucose-Capillary: 95 mg/dL (ref 70–99)

## 2019-06-12 LAB — CORTISOL-AM, BLOOD: Cortisol - AM: 16.4 ug/dL (ref 6.7–22.6)

## 2019-06-12 LAB — CBC
HCT: 31.6 % — ABNORMAL LOW (ref 36.0–46.0)
Hemoglobin: 9.7 g/dL — ABNORMAL LOW (ref 12.0–15.0)
MCH: 25.3 pg — ABNORMAL LOW (ref 26.0–34.0)
MCHC: 30.7 g/dL (ref 30.0–36.0)
MCV: 82.5 fL (ref 80.0–100.0)
Platelets: 86 10*3/uL — ABNORMAL LOW (ref 150–400)
RBC: 3.83 MIL/uL — ABNORMAL LOW (ref 3.87–5.11)
RDW: 27.9 % — ABNORMAL HIGH (ref 11.5–15.5)
WBC: 13.2 10*3/uL — ABNORMAL HIGH (ref 4.0–10.5)
nRBC: 1.4 % — ABNORMAL HIGH (ref 0.0–0.2)

## 2019-06-12 LAB — PROCALCITONIN: Procalcitonin: 18.91 ng/mL

## 2019-06-12 LAB — VITAMIN B12: Vitamin B-12: 1490 pg/mL — ABNORMAL HIGH (ref 180–914)

## 2019-06-12 LAB — TSH: TSH: 4.404 u[IU]/mL (ref 0.350–4.500)

## 2019-06-12 LAB — AMMONIA: Ammonia: 41 umol/L — ABNORMAL HIGH (ref 9–35)

## 2019-06-12 MED ORDER — SODIUM CHLORIDE 0.9 % IV BOLUS
500.0000 mL | Freq: Once | INTRAVENOUS | Status: AC
  Administered 2019-06-12: 500 mL via INTRAVENOUS

## 2019-06-12 MED ORDER — HYDROCORTISONE NA SUCCINATE PF 100 MG IJ SOLR
50.0000 mg | Freq: Four times a day (QID) | INTRAMUSCULAR | Status: DC
  Administered 2019-06-12 – 2019-06-14 (×9): 50 mg via INTRAVENOUS
  Filled 2019-06-12 (×9): qty 2

## 2019-06-12 NOTE — Progress Notes (Signed)
OT Cancellation Note  Patient Details Name: Kathleen [name] MRN: [id number] DOB: 1946/04/11   Cancelled Treatment:    Reason Eval/Treat Not Completed: Medical issues which prohibited therapy   Medical issues which prohibited therapy  Please refer to prefer RN progress note.  Pt also hypotensive.  Will check back as schedule permits.  Kari Baars, OT Acute Rehabilitation Services [phone] Office- [phone], Edwena Felty D 06/12/2019, 1:14 PM

## 2019-06-12 NOTE — Progress Notes (Signed)
PT Cancellation Note  Patient Details Name: Kathleen [name] MRN: [id number] DOB: 27-Sep-1946   Cancelled Treatment:    Reason Eval/Treat Not Completed: Medical issues which prohibited therapy  Please refer to prefer RN progress note.  Pt also hypotensive.  Will check back as schedule permits.   B[place]on Broadwater,KATHrine E 06/12/2019, 10:46 AM Carmelia Bake, PT, DPT Acute Rehabilitation Services Office: [phone] Pager: [phone]

## 2019-06-12 NOTE — Consult Note (Signed)
Consultation Note Date: 06/12/2019   Patient Name: Kathleen [name]  DOB: Sep 18, 1946  MRN: [id number]  Age / Sex: 73 y.o., female   PCP: Nolene Ebbs, MD Referring Physician: Antonieta Pert, MD   REASON FOR CONSULTATION:Establishing goals of care  Palliative Care consult requested for this 73 y.o. female with multiple medical problems including hypertension, diastolic congestive heart failure, CKD stage IV, coronary artery disease (stent 2003), diabetes mellitus, paroxysmal atrial fibrillation, GI bleed, and sleep apnea. She presented to ED with complaints of abdominal pain, nausea, vomiting, and diarrhea. Also reports some dysphagia. CT scan on 6/17 showed diverticulosis of descending colon and stranding pancreatic tail due to mild acute pancreatitis. She continues to have worsening renal failure, hypotension, and lethargy. Palliative Medicine team consulted for goals of care discussion.   Clinical Assessment and Goals of Care: I have reviewed medical records including lab results, imaging, Epic notes, and MAR, received report from the bedside RN, and assessed the patient. I met at the bedside with her family, 3 daughters Magda Paganini, Lattie Haw, and [place]) patient's sisters, and patients son, to discuss diagnosis prognosis, [place], EOL wishes, disposition and options. Ms. Schwegel is lethargic but easily aroused on voice command. She is awake and alert to self, place, and family. Denies pain.   I introduced Palliative Medicine as specialized medical care for people living with serious illness. It focuses on providing relief from the symptoms and stress of a serious illness. The goal is to improve quality of life for both the patient and the family.  We discussed a brief life review of the patient, along with her functional and nutritional status. 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 She is #3 of 18 siblings. She enjoys spending time with her family and friends. Her children live in [place],  [place], [place], and [place].   Prior to admission patient was living alone, however her daughter lived 2 min away and would stay with her throughout the day and during the night if needed. She was ambulatory with a cane or [place]. Family reports she required some assistance with ADLs such as bathing and dressing due to weakness. Family reports noticeable change in appetite over the past 2-3 months and increased fatigue and weakness.   We discussed Her current illness and what it means in the larger context of Her on-going co-morbidities. With specific discussions regarding her worsening renal failure, hypotension, dysphagia, lethargy, pneumonia, and overall functional and nutritional decline.  Natural disease trajectory and expectations at EOL were discussed. Family concerned with mother's decline, but verbalizes understanding of illness. Patient shares when she ate she felt as if food was becoming stuck in her throat and she does not have much of an appetite. Daughter shares patient has also had multiple falls over the past few months due to weakness.   Family questioning need for HD due to renal failure. Patient is shaking her head no when family asking questions about HD. 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 Patient shaking her head yes to daughters statement.   I discussed in details patient's poor prognosis and current plan of care. Family is tearful, when patient states "I am tired, I want to go to the farm and that's it!" Son verbalized understanding. Family asking about medical care and stability of patient transporting to [place] and continued care once she returns. Daughter states they  could take her to local hospital there for continued treatment. Patient expresses "no hospitals". Family tearful. Son at the bedside holding his mother's hand and ask her if she is wanting to  go to the farm and pass away there with family. Patient replies yes.   Family began conversations as one of the daughters are tearful stating she is not ready to allow their mother to go. Other siblings all in agreement that patient is tired and would like to carry out her wishes of getting her back to [place] and allow her to have a peaceful EOL experience.   We discussed in details continued aggressive medical interventions versus comfort care. I explained what comfort care would look like for patient. Patient awakens and states "not here, there!" Support provided. Son ask his mother if she wanted to die in the hospital she nods no, he asked if she wanted to die in [place] on their farm and she nods yes. Family all in agreement that they wish to carry out her wishes and proceed with transporting her back to [place]. They share it would be a 7 hr drive to get her there.   I attempted to elicit values and goals of care important to the patient.    I discussed at length the risk of patient traveling back with focus on continued health decline and possibility of death during transport. Family verbalized understanding. Patient's family contacting other family members and agents regarding private jet availability versus rental of a private vehicle. Risk discussed with flying versus driving. Family verbalized understanding of risk an all mutually agreed that they would like to proceed with arranging for her to go back to [place] for her EOL wishes and care, despite the risk. Family deciding on private vehicle that can accommodate her in a lying position with family support.   We discussed outpatient hospice support for EOL care. Family expressed they would be interested in their support and would research local hospice in the area she would be going and contact them for information and required documentation that they would need. Family will notify myself or nurse with updates on any needed information to make sure  services can be arranged for her arrival there. Again family is aware of the risk and expressed they would feel more at peace at least trying to carry out her wishes and if something happened in the process at least they were trying to get her there.    Patient does not have an advanced directive. She has 5 children all whom are at the bedside and making decisions together. All children and patient are mutually in agreement with goal of them personally arranging to transport patient back to [place] for EOL care to honor their mother's wishes despite understanding of risk.   I discussed with family in detail patient's full code status in consideration to her current illness and co-morbidities. Patient has expressed she did not want to continue treatments and wanting to [place] farm in [place] to die. Family verbalized their understanding, however are requesting she remains a full code until she is discharge and able to get to her destination. I educated family what a code situation would look like for patient in the event of a sudden cardiopulmonary event. Family verbalized understanding and requested to continue with full code with fear care would be withdrawn and she would not make it out of the hospital. Explained to family DNR does not withdraw care, it only means if she had a  sudden cardiac or respiratory event no heroic measures would be performed and patient would be allowed to naturally pass away. Family verbalized understanding and shared mistrust in system and request for continued full code status.   Family also requesting for family member to be allowed to remain with patient 24/7 in the event she declines. They share they would be more comfortable with family member being presented given patient's lethargy and critical state. Brother reports "this way if something does happen someone is here to witness and can call us and we can make a decision from there!" I discussed with departmental leadership  and medical team agrees to allow one family to remain with patient. I discussed in detail visitation policy with family and the ability for only 1 family member to stay with patient. Patient aware this family member would stay for course of hospital stay and does not include switching out of multiple family members given strict COVID-19 restrictions. Family verbalized understanding and appreciation.   Hospice  services outpatient were explained and offered. Patient and family verbalized their understanding and awareness of hospice's goals and philosophy of care. Family again expressed they would research area hospice services in [place] and reach out to them for required documentation to initiate care once patient arrives.   Questions and concerns were addressed. The family was encouraged to call with questions or concerns.  PMT will continue to support holistically.   SOCIAL HISTORY:     reports that she quit smoking about 5 years ago. Her smoking use included cigarettes. She has a 9.00 pack-year smoking history. She has never used smokeless tobacco. She reports that she does not drink alcohol or use drugs.  CODE STATUS: Full code  ADVANCE DIRECTIVES: NEXT OF KIN    SYMPTOM MANAGEMENT: per attending   Palliative Prophylaxis:   Aspiration, Bowel Regimen, Delirium Protocol, Frequent Pain Assessment, Oral Care and Turn Reposition  PSYCHO-SOCIAL/SPIRITUAL:  Support System: Family   Desire for further Chaplaincy support: NO   Additional Recommendations (Limitations, Scope, Preferences):  Full Scope Treatment   PAST MEDICAL HISTORY: Past Medical History:  Diagnosis Date   Arthritis    Blood transfusion    no side affects   CHF (congestive heart failure) (HCC)    CKD (chronic kidney disease), stage IV (HCC)    Coronary artery disease    Cypher stent to the RCA in 2003   Diabetes mellitus    Borderline   GERD (gastroesophageal reflux disease)    Hypertension     Shortness of breath    Sleep apnea     PAST SURGICAL HISTORY:  Past Surgical History:  Procedure Laterality Date   BIOPSY  06/06/2018   Procedure: BIOPSY;  Surgeon: Otis Brace, MD;  Location: [facility];  Service: Gastroenterology;;   CARDIOVERSION N/A 03/17/2015   Procedure: CARDIOVERSION;  Surgeon: Sueanne Margarita, MD;  Location: [facility];  Service: Cardiovascular;  Laterality: N/A;   CHOLECYSTECTOMY     CORONARY ANGIOPLASTY WITH STENT PLACEMENT     ENTEROSCOPY N/A 06/28/2018   Procedure: ENTEROSCOPY;  Surgeon: Carol Ada, MD;  Location: [facility];  Service: Endoscopy;  Laterality: N/A;   ENTEROSCOPY N/A 09/12/2018   Procedure: ENTEROSCOPY;  Surgeon: Carol Ada, MD;  Location: [facility];  Service: Endoscopy;  Laterality: N/A;   ENTEROSCOPY N/A 12/21/2018   Procedure: ENTEROSCOPY;  Surgeon: Carol Ada, MD;  Location: [facility];  Service: Endoscopy;  Laterality: N/A;   ENTEROSCOPY N/A 02/03/2019   Procedure: ENTEROSCOPY;  Surgeon: Ladene Artist, MD;  Location: [facility];  Service: Endoscopy;  Laterality: N/A;   ESOPHAGOGASTRODUODENOSCOPY (EGD) WITH PROPOFOL N/A 06/06/2018   Procedure: ESOPHAGOGASTRODUODENOSCOPY (EGD) WITH PROPOFOL;  Surgeon: Otis Brace, MD;  Location: [facility];  Service: Gastroenterology;  Laterality: N/A;   GIVENS CAPSULE STUDY N/A 06/25/2018   Procedure: GIVENS CAPSULE STUDY;  Surgeon: Carol Ada, MD;  Location: [facility];  Service: Endoscopy;  Laterality: N/A;   HOT HEMOSTASIS N/A 06/06/2018   Procedure: HOT HEMOSTASIS (ARGON PLASMA COAGULATION/BICAP);  Surgeon: Otis Brace, MD;  Location: [facility];  Service: Gastroenterology;  Laterality: N/A;   HOT HEMOSTASIS N/A 06/28/2018   Procedure: HOT HEMOSTASIS (ARGON PLASMA COAGULATION/BICAP);  Surgeon: Carol Ada, MD;  Location: [facility];  Service: Endoscopy;  Laterality: N/A;   HOT HEMOSTASIS N/A 12/21/2018   Procedure: HOT HEMOSTASIS (ARGON PLASMA  COAGULATION/BICAP);  Surgeon: Carol Ada, MD;  Location: [facility];  Service: Endoscopy;  Laterality: N/A;   HOT HEMOSTASIS N/A 02/03/2019   Procedure: HOT HEMOSTASIS (ARGON PLASMA COAGULATION/BICAP);  Surgeon: Ladene Artist, MD;  Location: [facility];  Service: Endoscopy;  Laterality: N/A;   TOTAL KNEE ARTHROPLASTY      ALLERGIES:  is allergic to penicillins.   MEDICATIONS:  Current Facility-Administered Medications  Medication Dose Route Frequency Provider Last Rate Last Dose   acetaminophen (TYLENOL) tablet 650 mg  650 mg Oral Q6H PRN Kyere, Belinda K, NP   650 mg at 06/07/19 1030   albuterol (PROVENTIL) (2.5 MG/3ML) 0.083% nebulizer solution 2.5 mg  2.5 mg Nebulization Q6H PRN Shelly Coss, MD       ceFEPIme (MAXIPIME) 2 g in sodium chloride 0.9 % 100 mL IVPB  2 g Intravenous Q24H Arlyn Dunning M, [facility]   Stopped at 06/11/19 2218   Chlorhexidine Gluconate Cloth 2 % PADS 6 each  6 each Topical Daily Kc, Maren Beach, MD   6 each at 06/12/19 0934   hydrocortisone sodium succinate (SOLU-CORTEF) 100 MG injection 50 mg  50 mg Intravenous Q6H Ollis, Brandi L, NP   50 mg at 06/12/19 0947   insulin aspart (novoLOG) injection 0-9 Units  0-9 Units Subcutaneous Q4H [place], Karsten Fells, NP   1 Units at 06/12/19 0119   iohexol (OMNIPAQUE) 300 MG/ML solution 30 mL  30 mL Oral Once PRN Antonieta Pert, MD       MEDLINE mouth rinse  15 mL Mouth Rinse BID Kc, Ramesh, MD   15 mL at 06/11/19 2300   metoprolol tartrate (LOPRESSOR) tablet 12.5 mg  12.5 mg Oral BID Kc, Ramesh, MD   12.5 mg at 06/11/19 2300   mometasone-formoterol (DULERA) 100-5 MCG/ACT inhaler 2 puff  2 puff Inhalation BID Shelly Coss, MD   2 puff at 06/11/19 1938   ondansetron (ZOFRAN) injection 4 mg  4 mg Intravenous Q6H PRN Shelly Coss, MD   4 mg at 06/02/19 2228   pantoprazole (PROTONIX) EC tablet 40 mg  40 mg Oral BID Polly Cobia, RPH   40 mg at 06/11/19 2300   sodium bicarbonate 150 mEq in sterile water 1,000  mL infusion   Intravenous Continuous Edrick Oh, MD 100 mL/hr at 06/12/19 1600     thiamine (VITAMIN B-1) tablet 100 mg  100 mg Oral Daily Reuel Boom A, RPH   100 mg at 06/11/19 1041    VITAL SIGNS: BP (!) 102/51    Pulse (!) 38    Temp (!) 97 F (36.1 C) (Axillary)    Resp 16    Ht '5\' 4"'$  (1.626 m)    Wt  99.4 kg    SpO2 98%    BMI 37.61 kg/m  Filed Weights   06/10/19 0630 06/11/19 0527 06/12/19 0500  Weight: 95.6 kg 97.7 kg 99.4 kg    Estimated body mass index is 37.61 kg/m as calculated from the following:   Height as of this encounter: '5\' 4"'$  (1.626 m).   Weight as of this encounter: 99.4 kg.  LABS: CBC:    Component Value Date/Time   WBC 13.2 (H) 06/12/2019 0219   HGB 9.7 (L) 06/12/2019 0219   HCT 31.6 (L) 06/12/2019 0219   PLT 86 (L) 06/12/2019 0219   Comprehensive Metabolic Panel:    Component Value Date/Time   NA 140 06/12/2019 0219   K 5.2 (H) 06/12/2019 0219   CO2 16 (L) 06/12/2019 0219   BUN 117 (H) 06/12/2019 0219   CREATININE 4.13 (H) 06/12/2019 0219   CREATININE 1.91 (H) 03/10/2015 1133   ALBUMIN 2.8 (L) 06/12/2019 0219     Review of Systems  Unable to perform ROS: Acuity of condition  Neurological: Positive for weakness.  Patient able to state she was weak, otherwise detailed ROS unobtainable due to intermittent lethargy.    Physical Exam General: NAD, chronically-ill,  frail appearing, thin Cardiovascular: irregular rate and rhythm Pulmonary: diminished bases Abdomen: soft, nontender, + bowel sounds Extremities: no edema, no joint deformities Skin: no rashes, some scattered bruising  Neurological: Weakness, awake, alert to person, place, dob, family, lethargic but aroused, often drifts back to sleep during conversation but again awakens    Prognosis: Poor (days to weeks) in the setting of lethargy, hypotension, diverticulitis, pancreatitis, a-fib, CKD stage IV (worsening), pneumonia, poor oral intake, deconditioning, dysphagia, hypernatremia,  metabolic encephalopathy, diabetes.   Discharge Planning:  to be determined. Family states they are arraning private vehicle transportation to get patient back to [place] for EOL care and support with hospice.   Recommendations:  Full Code-as confirmed by family  Continue current plan of care per attending team.   Family remains hopeful they can make arrangements with a private vehicle to transport patient back to [place] for EOL care and hospice support per patient wishes. They understand the risk of patient transporting including possible death in route.   Family to f/u tomorrow with final arrangements. Advised to give medical team several hours notice if patient is to discharge to their care tomorrow for transport.   PMT will continue to support and follow.    Palliative Performance Scale: PPS 10%              Family  expressed understanding and was in agreement with this plan.   Thank you for allowing the Palliative Medicine Team to assist in the care of this patient.  Time In: 1500 Time Out: 1705 Time Total: 125 min.   Visit consisted of counseling and education dealing with the complex and emotionally intense issues of symptom management and palliative care in the setting of serious and potentially life-threatening illness.Greater than 50%  of this time was spent counseling and coordinating care related to the above assessment and plan.  Signed by:  Alda Lea, AGPCNP-BC Palliative Medicine Team  Phone: [phone] Fax: [phone] Pager: [phone] Amion: Bjorn Pippin

## 2019-06-12 NOTE — Progress Notes (Signed)
PROGRESS NOTE    ROYA GIESELMAN  XLK:[id number] DOB: 04/21/1946 DOA: 05/30/2019 PCP: Nolene Ebbs, MD   Brief Narrative: 73 y.o.year old femalewith medical history significant for hypertension, diastolic heart failure, chronic kidney disease a stage IV, coronary artery disease, diabetes, paroxysmal A. fib history of GI bleedwho presented on 6/17/2020with persistent nausea vomiting abdominal pain and diarrheax 2 weeks as well as dysphagia to solids and liquids and was found to have diverticulitis, mild pancreatitis, AKI on CKD and acute metabolic encephlopathy.  Patient's hospital course complicated by episodes of rapid A. fib causing persistent hypotension and poor p.o. intake despite 7-day course treating her diverticulitis and resolution of her pancreatitis.  Patient underwent echocardiogram 6/27 which was unrevealing.  Started on low-dose Cardizem drip and given bolus of IV fluids.  On evening of 6/27, procalcitonin level markedly elevated at 8.24.  Started on cefepime. Patient was weaned off Cardizem and placed on low-dose metoprolol 6/28.  She remains to have soft labile blood pressure and creatinine is uptrending. 6/29-nephro reeval done. Pt transferred to step down after 1 litre bolus-BP in 100s.   Subjective: Overnight episodes of hypotension- got additional 500 ml bolus x2 and albumin. Thsi am BP in 80s- wrote 500 ml bolus and BP in 100/60s. Wbc down-trending, creat slightly down. she is alert,awake,oriented to place/dob, self but confused some. Spoke with ICU team given persistent hypotension episodes. Denies any abdominal pain, cough, no fever, chest pain. Family meeting with palliative today.  Assessment & Plan:  Leukocytosis with presumed HCAP: Patient was placed back in cefepime 6/27 with elevated procalcitonin level. WBC count slightly downtrending.  She has been afebrile.  Blood pressure has been soft/hypotensive needing fluids.Lactic acid running in 2.1 and improved to  1.8 ( also liver dysfunction could be contributing)repeated Ct abdomen/pelvis 6/29-no new finding-besides mild inflammatory stranding about the pancreatic tail, lower chest with a small-to-moderate left pleural effusion with associated atelectasis or consolidation. Recent Labs  Lab 06/08/19 0400 06/09/19 0417 06/10/19 0435 06/11/19 0434 06/12/19 0219  WBC 15.9* 16.9* 15.7* 15.2* 13.2*   Diverticulitis of large intestine without perforation/pancreatitis : CT on 6/17-"Diverticulosis of the descending colon with subtle adjacent stranding of the pericolonic fat as findings may be within normal although could be seen due to mild acute diverticulitis" she completed 7 days of Cipro/Flagyl. Unclear etiology of pancreatitis.  On 6/17:CT showed stranding about pancreatic tail which could be due to mild acute pancreatitis. Patient denies any abdominal pain, xray with gaseous distension but having bowel movements.  Tolerating dysphagia 3 diet as per speech but not eating much. Repeat CT with no new changes.  Hypotension in  setting of chronic A. fib with RVR, CKD stage IV and with pneumonia as above, poor oral intake.  Patient has been having episodes of hypotension.  She has been afebrile, remains on cefepime as #1, initially completed 7 days of IV antibiotics for diverticulitis. Continue on bicarb gtt. S/p 1 litre bolus 6/29 and in evening/overnight additional 1 litre and albumin.  Order for 500 mL bolus this am 6/30. Given her persistent hypotension consulted critical care team this morning.Cortisol TSH sent-started on hydrocortisone by critical care. Monitor closely.  AKI on CKD stage 4/metabolic acidosis: Baseline creatinine 3.1-3.8, BUN/creatinine uptrending. BUN at 117. Initially seen by nephrology. Initially suspected prerenal however also having some hypotension which also contribute to her worsening renal function. D Webb seeing again since 6/29 held lasix. Started on bicarb drip. Creat slightly better  after multiple boluses and ivf . hco3 is  at 16 from 10.  When talking with the patient with patient's middle daughter at the bedside patient is stating she knows about dialysis and does not want to be on it. Nephro on board-may need CRRT. Recent Labs  Lab 06/08/19 0400 06/09/19 0417 06/10/19 0435 06/11/19 0434 06/12/19 0219  BUN 104* 84* 114* 116* 117*  CREATININE 3.68* 3.89* 4.04* 4.36* 4.13*   Chronic atrial fibrillation with intermittent  EXN:TZGYFVC not on anticoagulation due to prior history of GI bleeding.  Patient was weaned off Cardizem drip on 6/28-then placed on metoprolol 25 mg This morning rate is controlled, on low-dose metoprolol 12.5 mg twice daily 6/29 but holding for hypotension.    Lethargy- also uremic. Is very weak, frail, barely moving her extremities.  Palliative care consulted to discuss goals of care.  Family had wished for aggressive care at this time.  Dysphagia: Appreciate SLP evaluation on dysphagia 3 diet.  Is not eating much.  Hypernatremia resolved with IV fluids.  Acute metabolic encephalopathy/slurred speech-appears somewhat confused today but aware about current place self, her date of birth, and her medical conditions, able to follow commands.suspected to be multifactorial in the setting of opiates, uremia, hypotension-she had negative work-up with CT head and MRI brain was unremarkable this admission.   Diabetes mellitus type 2, blood sugar is controlled, hb A1c 6.3, continue sliding scale insulin.  HLD: On pravastatin at home, on hold here due to elevated LFTs.  Acute on chronic diastolic CHF: Discontinued Lasix for now given creatinine.  Vitamin B12 deficiency hx: Stable B12 level.  Abnormal LFTs/anasarca: Bilirubin overall remains the same, AST normal ALT elevated. Acute hepatitis panel was unremarkable 6/18.  Imaging studies shows anasarca, no focal liver abnormalities in CT scan and with status post cholecystectomy with no biliary  dilatation. Recent Labs  Lab 06/07/19 0412 06/08/19 0400 06/09/19 0417 06/11/19 0434 06/12/19 0219  AST 34 32 33 40 39  ALT 83* 90* 92* 93* 83*  ALKPHOS 69 68 69 73 70  BILITOT 4.5* 3.9* 3.8* 4.1* 4.1*  PROT 5.5* 5.3* 5.6* 6.0* 5.9*  ALBUMIN 2.6* 2.5* 2.7* 2.7* 2.8*   GI bleed history with angiodysplasia.  Continue home Protonix.  Pepcid stopped.  Hemoglobin overall stable. Recent Labs  Lab 06/08/19 0400 06/09/19 0417 06/10/19 0435 06/11/19 0434 06/12/19 0219  HGB 9.9* 10.1* 9.5* 10.3* 9.7*  HCT 31.1* 32.7* 30.4* 32.7* 31.6*   Thrombocytopenia : Platelet has been running 80-90,000.  Monitor.  Will stop Pepcid. Recent Labs  Lab 06/08/19 0400 06/09/19 0417 06/10/19 0435 06/11/19 0434 06/12/19 0219  PLT 99* 95* 92* 97* 86*   Obesity with BMI at 36  Debility/physical deconditioning, continue PT OT, recommending skilled nursing facility.  Unable to transfer to bed to chair without max assistance.  Family agreeable for SNF.Social worker on board.  Umblical hernia with single nonobstructed loop of transverse colon seen in the CT scan.  DVT prophylaxis: SCD. Code Status: FULL Code.  Family Communication: I discussed the plan with patient-updated daughter at bedside 6/29.  Overall prognosis is guarded.  Family agreeable for palliative care consult and was ordered. Disposition Plan: remains inpatient pending clinical improvement.  Discussed with the critical care team apparently meeting with palliative care this afternoon . Nephro thinks she is not a good candidate for HD but suggesting CRRT.  Consultants:   Gastroenterology  Cardiology  Nephrology  Palliative care  PCCM  Procedures:  Echocardiogram done 6/27 with preserved ejection fraction and indeterminant diastolic dysfunction.  Moderate mitral regurg  Procedures: CT abdomen/pelvis : 05/30/19  1. Stranding of the peripancreatic fat adjacent the tail the pancreas which could be seen due to mild acute  pancreatitis. Recommend clinical correlation.  2. Diverticulosis of the descending colon with subtle adjacent stranding of the pericolonic fat as findings may be within normal although could be seen due to mild acute diverticulitis.  3. Moderate size umbilical hernia containing peritoneal fat and short segment of transverse colon. No evidence of bowel obstruction.  4. Subtle patchy opacification over the lung bases which may be due to atelectasis or early infection. Lingular atelectasis.  5.  Cardiomegaly.  Atherosclerotic coronary artery disease.  6.  Aortic Atherosclerosis (ICD10-I70.0).  7.  Slightly small kidneys.  Several stable right renal cysts.  CT abdomen/pelvis 6/29  1. There may be mild inflammatory stranding about the pancreatic tail (series 2, image 23, series 5, image 53), suspicious for pancreatitis.  2. Descending sigmoid diverticulosis without evidence of acute diverticulitis.  3. Left pleural effusion, anasarca, and small volume ascites in the low abdomen.  4. Other chronic, incidental, and postoperative findings as detailed above.  Antimicrobials:  Completed 7-day course of Cipro and Flagyl  IV cefepime 6/27-present   Anti-infectives (From admission, onward)   Start     Dose/Rate Route Frequency Ordered Stop   06/09/19 2000  ceFEPIme (MAXIPIME) 2 g in sodium chloride 0.9 % 100 mL IVPB     2 g 200 mL/hr over 30 Minutes Intravenous Every 24 hours 06/09/19 1846     06/06/19 1800  ciprofloxacin (CIPRO) tablet 500 mg     500 mg Oral Daily-1800 06/06/19 1036 06/07/19 1824   06/06/19 1400  metroNIDAZOLE (FLAGYL) tablet 500 mg     500 mg Oral Every 8 hours 06/06/19 1036 06/07/19 2330   06/01/19 0000  metroNIDAZOLE (FLAGYL) IVPB 500 mg  Status:  Discontinued     500 mg 100 mL/hr over 60 Minutes Intravenous Every 8 hours 05/31/19 2313 06/06/19 1036   05/31/19 2000  metroNIDAZOLE (FLAGYL) IVPB 500 mg  Status:  Discontinued     500 mg 100 mL/hr  over 60 Minutes Intravenous Every 8 hours 05/31/19 1822 05/31/19 2313   05/31/19 1930  ciprofloxacin (CIPRO) IVPB 400 mg  Status:  Discontinued     400 mg 200 mL/hr over 60 Minutes Intravenous Every 24 hours 05/31/19 1822 06/06/19 1036       Objective: Vitals:   06/12/19 0701 06/12/19 0758 06/12/19 0800 06/12/19 0843  BP:  (!) 96/40 (!) 98/40 100/64  Pulse:      Resp:  15 16 (!) 21  Temp:   98.3 F (36.8 C)   TempSrc:   Axillary   SpO2: 100% 100% 100% 100%  Weight:      Height:        Intake/Output Summary (Last 24 hours) at 06/12/2019 0920 Last data filed at 06/12/2019 0700 Gross per 24 hour  Intake 2320.11 ml  Output 150 ml  Net 2170.11 ml   Filed Weights   06/10/19 0630 06/11/19 0527 06/12/19 0500  Weight: 95.6 kg 97.7 kg 99.4 kg   Weight change: 1.7 kg  Body mass index is 37.61 kg/m.  Intake/Output from previous day: 06/29 0701 - 06/30 0700 In: 2320.1 [I.V.:724.3; IV Piggyback:1595.9] Out: 150 [Urine:150] Intake/Output this shift: No intake/output data recorded.  Examination:  General exam: Appears ill, frail, confused mildly.  HEENT:PERRL,Oral mucosa moist, Ear/Nose normal on gross exam Respiratory system: Bilateral diminished breath sounds, no crackles or wheezing.  Cardiovascular system: Irregularly irregular  rate and rhythm, S1 & S2 heard,No JVD, murmurs. Gastrointestinal system: Abdomen is  soft,non tender, non distended, BS +  Nervous System: she is alert,awake,oriented to place/dob, self but confused some- repeating answers. Able to move her UE and bend b/l knees.  Extremities: No edema, no clubbing, distal peripheral pulses palpable. Skin: No rashes, lesions, no icterus MSK: Normal muscle bulk,tone ,power  Medications:  Scheduled Meds:  Chlorhexidine Gluconate Cloth  6 each Topical Daily   famotidine  20 mg Oral q1800   hydrocortisone sod succinate (SOLU-CORTEF) inj  50 mg Intravenous Q6H   insulin aspart  0-9 Units Subcutaneous Q4H    mouth rinse  15 mL Mouth Rinse BID   metoprolol tartrate  12.5 mg Oral BID   mometasone-formoterol  2 puff Inhalation BID   pantoprazole  40 mg Oral BID   thiamine  100 mg Oral Daily   Continuous Infusions:  ceFEPime (MAXIPIME) IV Stopped (06/11/19 2218)   diltiazem (CARDIZEM) infusion Stopped (06/10/19 1414)    sodium bicarbonate (isotonic) infusion in sterile water Stopped (06/12/19 0116)    Data Reviewed: I have personally reviewed following labs and imaging studies  CBC: Recent Labs  Lab 06/08/19 0400 06/09/19 0417 06/10/19 0435 06/11/19 0434 06/12/19 0219  WBC 15.9* 16.9* 15.7* 15.2* 13.2*  HGB 9.9* 10.1* 9.5* 10.3* 9.7*  HCT 31.1* 32.7* 30.4* 32.7* 31.6*  MCV 78.5* 80.0 80.2 80.5 82.5  PLT 99* 95* 92* 97* 86*   Basic Metabolic Panel: Recent Labs  Lab 06/08/19 0400 06/09/19 0417 06/10/19 0435 06/11/19 0434 06/12/19 0219  NA 142 142 141 142 140  K 4.7 5.1 4.8 4.9 5.2*  CL 109 111 111 110 109  CO2 18* 18* 16* 15* 16*  GLUCOSE 119* 101* 109* 117* 107*  BUN 104* 84* 114* 116* 117*  CREATININE 3.68* 3.89* 4.04* 4.36* 4.13*  CALCIUM 9.8 9.8 9.2 9.4 8.8*   GFR: Estimated Creatinine Clearance: 13.7 mL/min (A) (by C-G formula based on SCr of 4.13 mg/dL (H)). Liver Function Tests: Recent Labs  Lab 06/07/19 0412 06/08/19 0400 06/09/19 0417 06/11/19 0434 06/12/19 0219  AST 34 32 33 40 39  ALT 83* 90* 92* 93* 83*  ALKPHOS 69 68 69 73 70  BILITOT 4.5* 3.9* 3.8* 4.1* 4.1*  PROT 5.5* 5.3* 5.6* 6.0* 5.9*  ALBUMIN 2.6* 2.5* 2.7* 2.7* 2.8*   No results for input(s): LIPASE, AMYLASE in the last 168 hours. Recent Labs  Lab 06/12/19 0219  AMMONIA 41*   Coagulation Profile: No results for input(s): INR, PROTIME in the last 168 hours. Cardiac Enzymes: No results for input(s): CKTOTAL, CKMB, CKMBINDEX, TROPONINI in the last 168 hours. BNP (last 3 results) No results for input(s): PROBNP in the last 8760 hours. HbA1C: No results for input(s): HGBA1C in the  last 72 hours. CBG: Recent Labs  Lab 06/11/19 1639 06/11/19 1932 06/11/19 2344 06/12/19 0337 06/12/19 0721  GLUCAP 112* 137* 122* 95 91   Lipid Profile: No results for input(s): CHOL, HDL, LDLCALC, TRIG, CHOLHDL, LDLDIRECT in the last 72 hours. Thyroid Function Tests: Recent Labs    06/12/19 0219  TSH 4.404   Anemia Panel: Recent Labs    06/12/19 0219  VITAMINB12 1,490*   Sepsis Labs: Recent Labs  Lab 06/09/19 1615 06/10/19 0435 06/11/19 0434 06/11/19 1532 06/11/19 1918 06/12/19 0219  PROCALCITON 8.24  --  16.60  --   --  18.91  LATICACIDVEN 2.2* 1.9 2.1* 1.8 1.5  --     Recent Results (from the past 240  hour(s))  Culture, blood (Routine X 2) w Reflex to ID Panel     Status: None (Preliminary result)   Collection Time: 06/09/19  7:38 PM   Specimen: BLOOD LEFT HAND  Result Value Ref Range Status   Specimen Description   Final    BLOOD LEFT HAND Performed at [facility] [address], [place], [place] [address]    Special Requests   Final    BOTTLES DRAWN AEROBIC ONLY Blood Culture adequate volume Performed at [facility] [address] [place], [place] [address]    Culture   Final    NO GROWTH 2 DAYS Performed at [facility] [address] [place], [place] [address]    Report Status PENDING  Incomplete  Culture, blood (Routine X 2) w Reflex to ID Panel     Status: None (Preliminary result)   Collection Time: 06/09/19  7:38 PM   Specimen: BLOOD  Result Value Ref Range Status   Specimen Description   Final    BLOOD LEFT ANTECUBITAL Performed at [facility], [place] [address] [place], [place] [address]    Special Requests   Final    BOTTLES DRAWN AEROBIC ONLY Blood Culture adequate volume Performed at [facility] [address] [place], [place] [address]    Culture   Final    NO GROWTH 2 DAYS Performed at [facility] [address], [place], [place] [address]     Report Status PENDING  Incomplete  Culture, Urine     Status: Abnormal   Collection Time: 06/10/19  9:00 AM   Specimen: Urine, Random  Result Value Ref Range Status   Specimen Description   Final    URINE, RANDOM Performed at [facility] [address] [place], [place] [address]    Special Requests   Final    NONE Performed at [name], [place] [address] [place], [place] [address]    Culture MULTIPLE SPECIES PRESENT, SUGGEST RECOLLECTION (A)  Final   Report Status 06/11/2019 FINAL  Final      Radiology Studies: Ct Abdomen Pelvis Wo Contrast  Result Date: 06/11/2019 CLINICAL DATA:  Abdominal pain, diverticulitis suspected EXAM: CT ABDOMEN AND PELVIS WITHOUT CONTRAST TECHNIQUE: Multidetector CT imaging of the abdomen and pelvis was performed following the standard protocol without IV contrast. Oral enteric contrast was administered. COMPARISON:  07/30/2019 FINDINGS: Lower chest: Small to moderate left pleural effusion with associated atelectasis or consolidation, new compared to prior examination. Cardiomegaly and coronary artery calcifications. Hepatobiliary: No focal liver abnormality is seen. Status post cholecystectomy. No biliary dilatation. Pancreas: There may be minimal inflammatory stranding around the pancreatic tail (series 2, image 23). Spleen: Normal in size without significant abnormality. Adrenals/Urinary Tract: Adrenal glands are unremarkable. Kidneys are normal, without renal calculi, solid lesion, or hydronephrosis. Bladder is unremarkable. Stomach/Bowel: Stomach is within normal limits. Appendix appears normal. No evidence of bowel wall thickening, distention, or inflammatory changes. Descending sigmoid diverticulosis. Vascular/Lymphatic: Aortic atherosclerosis. No enlarged abdominal or pelvic lymph nodes. Reproductive: No mass or other significant abnormality. Other: Redemonstrated umbilical hernia containing a single nonobstructed loop  of transverse colon. Anasarca. Small volume ascites in the low abdomen. Musculoskeletal: No acute or significant osseous findings. IMPRESSION: 1. There may be mild inflammatory stranding about the pancreatic tail (series 2, image 23, series 5, image 53), suspicious for pancreatitis. 2. Descending sigmoid diverticulosis without evidence of acute diverticulitis. 3. Left pleural effusion, anasarca,  and small volume ascites in the low abdomen. 4. Other chronic, incidental, and postoperative findings as detailed above. Electronically Signed   By: Eddie Candle M.D.   On: 06/11/2019 18:57      LOS: 12 days   Time spent: More than 50% of that time was spent in counseling and/or coordination of care.  Antonieta Pert, MD [facility]  06/12/2019, 9:20 AM

## 2019-06-12 NOTE — Progress Notes (Addendum)
    Called earlier in day regarding hypotension  MAP now 65 and 70. Looks stable Possible home hospice after palliative meeting later today  CCM will be available if needed Given her fraility would continue hydrocort     SIGNATURE    Dr. Brand Males, M.D., F.C.C.P,  Pulmonary and Critical Care Medicine Staff Physician, [place] Director - Interstitial Lung Disease  Program  Pulmonary [place] at [place], [place], [address]  Pager: [phone], If no answer or between  15:00h - 7:00h: call [phone] Telephone: [phone]  1:51 PM 06/12/2019

## 2019-06-12 NOTE — Progress Notes (Signed)
Tried to give patient PO AM meds and she specifically said with 3 of her daughters in the room: "I do not want to take anymore medications, I want to be done and go home and die." Patient Alert and Oriented at this time. Patient's daughters did not respond well to her saying this and one daughter stormed out of the room.

## 2019-06-12 NOTE — Progress Notes (Signed)
[facility] ROUNDING NOTE   Subjective:   This is a 73 year old lady with a history of hypertension diastolic heart failure stage IV chronic kidney disease coronary artery disease diabetes paroxysmal atrial fibrillation and GI bleed.  She presented on 05/30/2019 with nausea vomiting abdominal pain.  She developed acute on chronic kidney disease and nephrology was consulted.  They signed off on 06/04/2019 as her acute component seem to be resolving.  Her hospitalization has been complicated by rapid atrial fibrillation and hypotension 2D echo 06/09/2019 was unrevealing except for some diastolic dysfunction.  She was started on low-dose Cardizem drip and IV fluids.  Urine output minimal 06/12/2019  CT scan mild inflammatory stranding pancreatic tail suspicious for pancreatitis descending sigmoid diverticulosis pleural effusion anasarca mild ascites.  Blood pressure 85/51 pulse 86 temperature 96.8 O2 sats 1% room air  Sodium 140 potassium 5.2 chloride 109 CO2 16 BUN 117 creatinine 4.13 albumin 2.8 AST 39 ALT 383 B12 1490 lactic acid 1.5 WBC 13.2 hemoglobin 9.7 platelets 88  Chest x-ray persistent left infrahilar consolidation  Meds Protonix 40 mg daily thiamine 100 mg daily  Cefepime 2 g every 24 hours  IV hydrocortisone 50 mg every 6 hours  Objective:  Vital signs in last 24 hours:  Temp:  [96.6 F (35.9 C)-98.3 F (36.8 C)] 98.3 F (36.8 C) (06/30 0800) Pulse Rate:  [79-98] 79 (06/30 0200) Resp:  [12-28] 12 (06/30 1000) BP: (79-104)/(36-82) 95/58 (06/30 1000) SpO2:  [98 %-100 %] 100 % (06/30 1000) Weight:  [99.4 kg] 99.4 kg (06/30 0500)  Weight change: 1.7 kg Filed Weights   06/10/19 0630 06/11/19 0527 06/12/19 0500  Weight: 95.6 kg 97.7 kg 99.4 kg    Intake/Output: I/O last 3 completed shifts: In: 2690.7 [P.O.:60; I.V.:834.8; IV Piggyback:1795.9] Out: 180 [Urine:180]   Intake/Output this shift:  No intake/output data recorded.   Alert  fatigued CVS-irregularly irregular JVP not elevated RS-clear with no wheezes rales ABD- BS present soft non-distended EXT-trace pitting edema   Basic Metabolic Panel: Recent Labs  Lab 06/08/19 0400 06/09/19 0417 06/10/19 0435 06/11/19 0434 06/12/19 0219  NA 142 142 141 142 140  K 4.7 5.1 4.8 4.9 5.2*  CL 109 111 111 110 109  CO2 18* 18* 16* 15* 16*  GLUCOSE 119* 101* 109* 117* 107*  BUN 104* 84* 114* 116* 117*  CREATININE 3.68* 3.89* 4.04* 4.36* 4.13*  CALCIUM 9.8 9.8 9.2 9.4 8.8*    Liver Function Tests: Recent Labs  Lab 06/07/19 0412 06/08/19 0400 06/09/19 0417 06/11/19 0434 06/12/19 0219  AST 34 32 33 40 39  ALT 83* 90* 92* 93* 83*  ALKPHOS 69 68 69 73 70  BILITOT 4.5* 3.9* 3.8* 4.1* 4.1*  PROT 5.5* 5.3* 5.6* 6.0* 5.9*  ALBUMIN 2.6* 2.5* 2.7* 2.7* 2.8*   No results for input(s): LIPASE, AMYLASE in the last 168 hours. Recent Labs  Lab 06/12/19 0219  AMMONIA 41*    CBC: Recent Labs  Lab 06/08/19 0400 06/09/19 0417 06/10/19 0435 06/11/19 0434 06/12/19 0219  WBC 15.9* 16.9* 15.7* 15.2* 13.2*  HGB 9.9* 10.1* 9.5* 10.3* 9.7*  HCT 31.1* 32.7* 30.4* 32.7* 31.6*  MCV 78.5* 80.0 80.2 80.5 82.5  PLT 99* 95* 92* 97* 86*    Cardiac Enzymes: No results for input(s): CKTOTAL, CKMB, CKMBINDEX, TROPONINI in the last 168 hours.  BNP: Invalid input(s): POCBNP  CBG: Recent Labs  Lab 06/11/19 1639 06/11/19 1932 06/11/19 2344 06/12/19 0337 06/12/19 0721  GLUCAP 112* 137* 122* 95 91  Microbiology: Results for orders placed or performed during the hospital encounter of 05/30/19  SARS Coronavirus 2 (CEPHEID - Performed in [facility] hospital lab), Hosp Order     Status: None   Collection Time: 05/30/19  6:17 AM   Specimen: Nasopharyngeal Swab  Result Value Ref Range Status   SARS Coronavirus 2 NEGATIVE NEGATIVE Final    Comment: (NOTE) If result is NEGATIVE SARS-CoV-2 target nucleic acids are NOT DETECTED. The SARS-CoV-2 RNA is generally  detectable in upper and lower  respiratory specimens during the acute phase of infection. The lowest  concentration of SARS-CoV-2 viral copies this assay can detect is 250  copies / mL. A negative result does not preclude SARS-CoV-2 infection  and should not be used as the sole basis for treatment or other  patient management decisions.  A negative result may occur with  improper specimen collection / handling, submission of specimen other  than nasopharyngeal swab, presence of viral mutation(s) within the  areas targeted by this assay, and inadequate number of viral copies  (<250 copies / mL). A negative result must be combined with clinical  observations, patient history, and epidemiological information. If result is POSITIVE SARS-CoV-2 target nucleic acids are DETECTED. The SARS-CoV-2 RNA is generally detectable in upper and lower  respiratory specimens dur ing the acute phase of infection.  Positive  results are indicative of active infection with SARS-CoV-2.  Clinical  correlation with patient history and other diagnostic information is  necessary to determine patient infection status.  Positive results do  not rule out bacterial infection or co-infection with other viruses. If result is PRESUMPTIVE POSTIVE SARS-CoV-2 nucleic acids MAY BE PRESENT.   A presumptive positive result was obtained on the submitted specimen  and confirmed on repeat testing.  While 2019 novel coronavirus  (SARS-CoV-2) nucleic acids may be present in the submitted sample  additional confirmatory testing may be necessary for epidemiological  and / or clinical management purposes  to differentiate between  SARS-CoV-2 and other Sarbecovirus currently known to infect humans.  If clinically indicated additional testing with an alternate test  methodology [phone]) is advised. The SARS-CoV-2 RNA is generally  detectable in upper and lower respiratory sp ecimens during the acute  phase of infection. The  expected result is Negative. Fact Sheet for Patients:  StrictlyIdeas.no Fact Sheet for Healthcare Providers: BankingDealers.co.za This test is not yet approved or cleared by the [place] FDA and has been authorized for detection and/or diagnosis of SARS-CoV-2 by FDA under an Emergency Use Authorization (EUA).  This EUA will remain in effect (meaning this test can be used) for the duration of the COVID-19 declaration under Section 564(b)(1) of the Act, 21 U.S.C. section 360bbb-3(b)(1), unless the authorization is terminated or revoked sooner. Performed at [facility], [place] [address], [place], [place] [address]   Urine Culture     Status: None   Collection Time: 05/31/19  2:26 PM   Specimen: Urine, Catheterized  Result Value Ref Range Status   Specimen Description   Final    URINE, CATHETERIZED Performed at [facility] [address], [place], [place] [address]    Special Requests   Final    NONE Performed at [facility], [place] [address], [place], [place] [address]    Culture   Final    NO GROWTH Performed at [facility], [place] [address], [place],  [address]    Report Status 06/01/2019 FINAL  Final  Gastrointestinal  Panel by PCR , Stool     Status: None   Collection Time: 06/01/19  1:44 PM   Specimen: Stool  Result Value Ref Range Status   Campylobacter species NOT DETECTED NOT DETECTED Final   Plesimonas shigelloides NOT DETECTED NOT DETECTED Final   Salmonella species NOT DETECTED NOT DETECTED Final   Yersinia enterocolitica NOT DETECTED NOT DETECTED Final   Vibrio species NOT DETECTED NOT DETECTED Final   Vibrio cholerae NOT DETECTED NOT DETECTED Final   Enteroaggregative E coli (EAEC) NOT DETECTED NOT DETECTED Final   Enteropathogenic E coli (EPEC) NOT DETECTED NOT DETECTED Final   Enterotoxigenic E coli (ETEC) NOT DETECTED NOT DETECTED Final    Shiga like toxin producing E coli (STEC) NOT DETECTED NOT DETECTED Final   Shigella/Enteroinvasive E coli (EIEC) NOT DETECTED NOT DETECTED Final   Cryptosporidium NOT DETECTED NOT DETECTED Final   Cyclospora cayetanensis NOT DETECTED NOT DETECTED Final   Entamoeba histolytica NOT DETECTED NOT DETECTED Final   Giardia lamblia NOT DETECTED NOT DETECTED Final   Adenovirus F40/41 NOT DETECTED NOT DETECTED Final   Astrovirus NOT DETECTED NOT DETECTED Final   Norovirus GI/GII NOT DETECTED NOT DETECTED Final   Rotavirus A NOT DETECTED NOT DETECTED Final   Sapovirus (I, II, IV, and V) NOT DETECTED NOT DETECTED Final    Comment: Performed at [facility], [place] [place], [place] [address]  Culture, blood (Routine X 2) w Reflex to ID Panel     Status: None (Preliminary result)   Collection Time: 06/09/19  7:38 PM   Specimen: BLOOD LEFT HAND  Result Value Ref Range Status   Specimen Description   Final    BLOOD LEFT HAND Performed at [facility], [place] [address], [place], [place] [address]    Special Requests   Final    BOTTLES DRAWN AEROBIC ONLY Blood Culture adequate volume Performed at [facility] [address] [place], [place] [address]    Culture   Final    NO GROWTH 2 DAYS Performed at [facility] [address], [place], [place] [address]    Report Status PENDING  Incomplete  Culture, blood (Routine X 2) w Reflex to ID Panel     Status: None (Preliminary result)   Collection Time: 06/09/19  7:38 PM   Specimen: BLOOD  Result Value Ref Range Status   Specimen Description   Final    BLOOD LEFT ANTECUBITAL Performed at [facility], [place] [address] [place], [place] [address]    Special Requests   Final    BOTTLES DRAWN AEROBIC ONLY Blood Culture adequate volume Performed at [facility] [address] [place], [place] [address]    Culture   Final    NO GROWTH 2 DAYS Performed at [facility] [address], [place], [place] [address]    Report Status PENDING  Incomplete  Culture, Urine     Status: Abnormal   Collection Time: 06/10/19  9:00 AM   Specimen: Urine, Random  Result Value Ref Range Status   Specimen Description   Final    URINE, RANDOM Performed at [facility] [address], [place], [place] [address]    Special Requests   Final    NONE Performed at [facility], [place] [address], [place],  [address]    Culture MULTIPLE SPECIES PRESENT, SUGGEST RECOLLECTION (A)  Final   Report Status 06/11/2019 FINAL  Final    Coagulation Studies: No results for input(s): LABPROT, INR in the last 72 hours.  Urinalysis: Recent Labs    06/10/19 0900  COLORURINE AMBER*  LABSPEC 1.017  PHURINE 5.0  GLUCOSEU NEGATIVE  HGBUR NEGATIVE  BILIRUBINUR NEGATIVE  KETONESUR NEGATIVE  PROTEINUR NEGATIVE  NITRITE NEGATIVE  LEUKOCYTESUR TRACE*      Imaging: Ct Abdomen Pelvis Wo Contrast  Result Date: 06/11/2019 CLINICAL DATA:  Abdominal pain, diverticulitis suspected EXAM: CT ABDOMEN AND PELVIS WITHOUT CONTRAST TECHNIQUE: Multidetector CT imaging of the abdomen and pelvis was performed following the standard protocol without IV contrast. Oral enteric contrast was administered. COMPARISON:  07/30/2019 FINDINGS: Lower chest: Small to moderate left pleural effusion with associated atelectasis or consolidation, new compared to prior examination. Cardiomegaly and coronary artery calcifications. Hepatobiliary: No focal liver abnormality is seen. Status post cholecystectomy. No biliary dilatation. Pancreas: There may be minimal inflammatory stranding around the pancreatic tail (series 2, image 23). Spleen: Normal in size without significant abnormality. Adrenals/Urinary Tract: Adrenal glands are unremarkable. Kidneys are normal, without renal calculi, solid lesion, or hydronephrosis. Bladder is unremarkable. Stomach/Bowel: Stomach is  within normal limits. Appendix appears normal. No evidence of bowel wall thickening, distention, or inflammatory changes. Descending sigmoid diverticulosis. Vascular/Lymphatic: Aortic atherosclerosis. No enlarged abdominal or pelvic lymph nodes. Reproductive: No mass or other significant abnormality. Other: Redemonstrated umbilical hernia containing a single nonobstructed loop of transverse colon. Anasarca. Small volume ascites in the low abdomen. Musculoskeletal: No acute or significant osseous findings. IMPRESSION: 1. There may be mild inflammatory stranding about the pancreatic tail (series 2, image 23, series 5, image 53), suspicious for pancreatitis. 2. Descending sigmoid diverticulosis without evidence of acute diverticulitis. 3. Left pleural effusion, anasarca, and small volume ascites in the low abdomen. 4. Other chronic, incidental, and postoperative findings as detailed above. Electronically Signed   By: Eddie Candle M.D.   On: 06/11/2019 18:57     Medications:   . ceFEPime (MAXIPIME) IV Stopped (06/11/19 2218)  . diltiazem (CARDIZEM) infusion Stopped (06/10/19 1414)  .  sodium bicarbonate (isotonic) infusion in sterile water Stopped (06/12/19 0116)   . Chlorhexidine Gluconate Cloth  6 each Topical Daily  . famotidine  20 mg Oral q1800  . hydrocortisone sod succinate (SOLU-CORTEF) inj  50 mg Intravenous Q6H  . insulin aspart  0-9 Units Subcutaneous Q4H  . mouth rinse  15 mL Mouth Rinse BID  . metoprolol tartrate  12.5 mg Oral BID  . mometasone-formoterol  2 puff Inhalation BID  . pantoprazole  40 mg Oral BID  . thiamine  100 mg Oral Daily   acetaminophen, albuterol, iohexol, ondansetron (ZOFRAN) IV  Assessment/ Plan:   Acute on chronic kidney disease stage IV baseline about 3.13.8 with worsening BUN.  Appears to have an increased weight but no evidence of gross congestive heart failure.  We will switch IV fluids to D5W with IV bicarbonate continue to monitor.  Chronic atrial  fibrillation with intermittent rapid ventricular rate rate appears to be better controlled currently weaned off Cardizem, metoprolol discontinued due to hypotension  Acute mild descending diverticulitis treated with a 7-day course of Cipro and Flagyl  Obesity with BMI greater than 30  Acute metabolic encephalopathy appears to have resolved  Metabolic acidosis continues on IV bicarbonate.  COPD stable on inhalers  Hyperbilirubinemia and transaminitis appears to be improving  Diabetes mellitus appears to be stable  Hyperlipidemia statins have been held secondary transaminitis  Gout appears to be stable with no acute exacerbations uses allopurinol at  home  History of GI bleed with angiodysplasia at home on Protonix hemoglobin is been stable  Stage II pressure ulcer.  Patient is a poor candidate for dialysis I discussed this with the family.  They appear to want to pursue all measures.  We may need to start CRRT.  I once again discussed with family   LOS: [place] @TODAY @10 :43 AM

## 2019-06-13 DIAGNOSIS — I959 Hypotension, unspecified: Secondary | ICD-10-CM

## 2019-06-13 DIAGNOSIS — R627 Adult failure to thrive: Secondary | ICD-10-CM

## 2019-06-13 LAB — GLUCOSE, CAPILLARY
Glucose-Capillary: 124 mg/dL — ABNORMAL HIGH (ref 70–99)
Glucose-Capillary: 116 mg/dL — ABNORMAL HIGH (ref 70–99)
Glucose-Capillary: 128 mg/dL — ABNORMAL HIGH (ref 70–99)
Glucose-Capillary: 146 mg/dL — ABNORMAL HIGH (ref 70–99)
Glucose-Capillary: 136 mg/dL — ABNORMAL HIGH (ref 70–99)
Glucose-Capillary: 134 mg/dL — ABNORMAL HIGH (ref 70–99)

## 2019-06-13 LAB — CBC
HCT: 30.6 % — ABNORMAL LOW (ref 36.0–46.0)
Hemoglobin: 9.9 g/dL — ABNORMAL LOW (ref 12.0–15.0)
MCH: 25.8 pg — ABNORMAL LOW (ref 26.0–34.0)
MCHC: 32.4 g/dL (ref 30.0–36.0)
MCV: 79.9 fL — ABNORMAL LOW (ref 80.0–100.0)
Platelets: 78 10*3/uL — ABNORMAL LOW (ref 150–400)
RBC: 3.83 MIL/uL — ABNORMAL LOW (ref 3.87–5.11)
RDW: 27.8 % — ABNORMAL HIGH (ref 11.5–15.5)
WBC: 14.5 10*3/uL — ABNORMAL HIGH (ref 4.0–10.5)
nRBC: 0.8 % — ABNORMAL HIGH (ref 0.0–0.2)

## 2019-06-13 LAB — COMPREHENSIVE METABOLIC PANEL
ALT: 77 U/L — ABNORMAL HIGH (ref 0–44)
AST: 34 U/L (ref 15–41)
Albumin: 2.7 g/dL — ABNORMAL LOW (ref 3.5–5.0)
Alkaline Phosphatase: 73 U/L (ref 38–126)
Anion gap: 14 (ref 5–15)
BUN: 119 mg/dL — ABNORMAL HIGH (ref 8–23)
CO2: 22 mmol/L (ref 22–32)
Calcium: 8.7 mg/dL — ABNORMAL LOW (ref 8.9–10.3)
Chloride: 105 mmol/L (ref 98–111)
Creatinine, Ser: 4.14 mg/dL — ABNORMAL HIGH (ref 0.44–1.00)
GFR calc Af Amer: 12 mL/min — ABNORMAL LOW (ref >60)
GFR calc non Af Amer: 10 mL/min — ABNORMAL LOW (ref >60)
Glucose, Bld: 121 mg/dL — ABNORMAL HIGH (ref 70–99)
Potassium: 4.5 mmol/L (ref 3.5–5.1)
Sodium: 141 mmol/L (ref 135–145)
Total Bilirubin: 3.6 mg/dL — ABNORMAL HIGH (ref 0.3–1.2)
Total Protein: 6 g/dL — ABNORMAL LOW (ref 6.5–8.1)

## 2019-06-13 NOTE — Progress Notes (Signed)
[facility] ROUNDING NOTE   Subjective:   This is a 73 year old lady with a history of hypertension diastolic heart failure stage IV chronic kidney disease coronary artery disease diabetes paroxysmal atrial fibrillation and GI bleed.  She presented on 05/30/2019 with nausea vomiting abdominal pain.  She developed acute on chronic kidney disease and nephrology was consulted.  They signed off on 06/04/2019 as her acute component seem to be resolving.  Her hospitalization has been complicated by rapid atrial fibrillation and hypotension 2D echo 06/09/2019 was unrevealing except for some diastolic dysfunction.  She was started on low-dose Cardizem drip and IV fluids.  She has been transferred to the intensive care unit due to hypotension.  Her sister is in the room with her and informs me that they will be transferring her to [place] with a question of her needing hospice.  Urine output minimal 06/12/2019  CT scan mild inflammatory stranding pancreatic tail suspicious for pancreatitis descending sigmoid diverticulosis pleural effusion anasarca mild ascites.  Blood pressure 98/60 pulse 96 temperature 98 O2 sats 100% room air  Sodium 141 potassium 4.5 chloride 105 CO2 22 BUN 119 creatinine 4.1 glucose 121 calcium 8.7 albumin 2.7 AST 34 ALT 77 ammonia 41 bilirubin 3.6 WBC 14.5 hemoglobin 9.9 platelets 78  Chest x-ray persistent left infrahilar consolidation  Meds Protonix 40 mg daily thiamine 100 mg daily, metoprolol 12.5 mg twice daily  Cefepime 2 g every 24 hours  IV hydrocortisone 50 mg every 6 hours  IV sodium bicarbonate's 100 cc an hour  Objective:  Vital signs in last 24 hours:  Temp:  [97 F (36.1 C)-98 F (36.7 C)] 98 F (36.7 C) (07/01 1001) Pulse Rate:  [38-109] 103 (07/01 1001) Resp:  [12-28] 14 (07/01 1001) BP: (85-108)/(27-75) 98/60 (07/01 1001) SpO2:  [97 %-100 %] 100 % (07/01 1001) Weight:  [103.1 kg] 103.1 kg (07/01 0358)  Weight change: 3.7 kg Filed  Weights   06/11/19 0527 06/12/19 0500 06/13/19 0358  Weight: 97.7 kg 99.4 kg 103.1 kg    Intake/Output: I/O last 3 completed shifts: In: 2729.3 [P.O.:240; I.V.:1786.7; IV Piggyback:702.5] Out: 350 [Urine:350]   Intake/Output this shift:  No intake/output data recorded.   Alert fatigued CVS-irregularly irregular JVP not elevated RS-clear with no wheezes rales ABD- BS present soft non-distended EXT-2+ edema   Basic Metabolic Panel: Recent Labs  Lab 06/09/19 0417 06/10/19 0435 06/11/19 0434 06/12/19 0219 06/13/19 0220  NA 142 141 142 140 141  K 5.1 4.8 4.9 5.2* 4.5  CL 111 111 110 109 105  CO2 18* 16* 15* 16* 22  GLUCOSE 101* 109* 117* 107* 121*  BUN 84* 114* 116* 117* 119*  CREATININE 3.89* 4.04* 4.36* 4.13* 4.14*  CALCIUM 9.8 9.2 9.4 8.8* 8.7*    Liver Function Tests: Recent Labs  Lab 06/08/19 0400 06/09/19 0417 06/11/19 0434 06/12/19 0219 06/13/19 0220  AST 32 33 40 39 34  ALT 90* 92* 93* 83* 77*  ALKPHOS 68 69 73 70 73  BILITOT 3.9* 3.8* 4.1* 4.1* 3.6*  PROT 5.3* 5.6* 6.0* 5.9* 6.0*  ALBUMIN 2.5* 2.7* 2.7* 2.8* 2.7*   No results for input(s): LIPASE, AMYLASE in the last 168 hours. Recent Labs  Lab 06/12/19 0219  AMMONIA 41*    CBC: Recent Labs  Lab 06/09/19 0417 06/10/19 0435 06/11/19 0434 06/12/19 0219 06/13/19 0220  WBC 16.9* 15.7* 15.2* 13.2* 14.5*  HGB 10.1* 9.5* 10.3* 9.7* 9.9*  HCT 32.7* 30.4* 32.7* 31.6* 30.6*  MCV 80.0 80.2 80.5 82.5 79.9*  PLT 95* 92* 97* 86* 78*    Cardiac Enzymes: No results for input(s): CKTOTAL, CKMB, CKMBINDEX, TROPONINI in the last 168 hours.  BNP: Invalid input(s): POCBNP  CBG: Recent Labs  Lab 06/12/19 1651 06/12/19 2016 06/12/19 2345 06/13/19 0348 06/13/19 0742  GLUCAP 131* 129* 114* 124* 116*    Microbiology: Results for orders placed or performed during the hospital encounter of 05/30/19  SARS Coronavirus 2 (CEPHEID - Performed in [place] hospital lab), Hosp Order     Status: None    Collection Time: 05/30/19  6:17 AM   Specimen: Nasopharyngeal Swab  Result Value Ref Range Status   SARS Coronavirus 2 NEGATIVE NEGATIVE Final    Comment: (NOTE) If result is NEGATIVE SARS-CoV-2 target nucleic acids are NOT DETECTED. The SARS-CoV-2 RNA is generally detectable in upper and lower  respiratory specimens during the acute phase of infection. The lowest  concentration of SARS-CoV-2 viral copies this assay can detect is 250  copies / mL. A negative result does not preclude SARS-CoV-2 infection  and should not be used as the sole basis for treatment or other  patient management decisions.  A negative result may occur with  improper specimen collection / handling, submission of specimen other  than nasopharyngeal swab, presence of viral mutation(s) within the  areas targeted by this assay, and inadequate number of viral copies  (<250 copies / mL). A negative result must be combined with clinical  observations, patient history, and epidemiological information. If result is POSITIVE SARS-CoV-2 target nucleic acids are DETECTED. The SARS-CoV-2 RNA is generally detectable in upper and lower  respiratory specimens dur ing the acute phase of infection.  Positive  results are indicative of active infection with SARS-CoV-2.  Clinical  correlation with patient history and other diagnostic information is  necessary to determine patient infection status.  Positive results do  not rule out bacterial infection or co-infection with other viruses. If result is PRESUMPTIVE POSTIVE SARS-CoV-2 nucleic acids MAY BE PRESENT.   A presumptive positive result was obtained on the submitted specimen  and confirmed on repeat testing.  While 2019 novel coronavirus  (SARS-CoV-2) nucleic acids may be present in the submitted sample  additional confirmatory testing may be necessary for epidemiological  and / or clinical management purposes  to differentiate between  SARS-CoV-2 and other Sarbecovirus  currently known to infect humans.  If clinically indicated additional testing with an alternate test  methodology [id number]) is advised. The SARS-CoV-2 RNA is generally  detectable in upper and lower respiratory sp ecimens during the acute  phase of infection. The expected result is Negative. Fact Sheet for Patients:  StrictlyIdeas.no Fact Sheet for Healthcare Providers: BankingDealers.co.za This test is not yet approved or cleared by the [place] FDA and has been authorized for detection and/or diagnosis of SARS-CoV-2 by FDA under an Emergency Use Authorization (EUA).  This EUA will remain in effect (meaning this test can be used) for the duration of the COVID-19 declaration under Section 564(b)(1) of the Act, 21 U.S.C. section 360bbb-3(b)(1), unless the authorization is terminated or revoked sooner. Performed at [facility], [place] [address], [place], [place] [address]   Urine Culture     Status: None   Collection Time: 05/31/19  2:26 PM   Specimen: Urine, Catheterized  Result Value Ref Range Status   Specimen Description   Final    URINE, CATHETERIZED Performed at [facility] [address] [place], [place] [address]    Special Requests  Final    NONE Performed at [facility], [place] [address], [place], [place] [address]    Culture   Final    NO GROWTH Performed at [facility], [place] [address] [place], [place] [address]    Report Status 06/01/2019 FINAL  Final  Gastrointestinal Panel by PCR , Stool     Status: None   Collection Time: 06/01/19  1:44 PM   Specimen: Stool  Result Value Ref Range Status   Campylobacter species NOT DETECTED NOT DETECTED Final   Plesimonas shigelloides NOT DETECTED NOT DETECTED Final   Salmonella species NOT DETECTED NOT DETECTED Final   Yersinia enterocolitica NOT DETECTED NOT DETECTED Final   Vibrio species NOT DETECTED NOT  DETECTED Final   Vibrio cholerae NOT DETECTED NOT DETECTED Final   Enteroaggregative E coli (EAEC) NOT DETECTED NOT DETECTED Final   Enteropathogenic E coli (EPEC) NOT DETECTED NOT DETECTED Final   Enterotoxigenic E coli (ETEC) NOT DETECTED NOT DETECTED Final   Shiga like toxin producing E coli (STEC) NOT DETECTED NOT DETECTED Final   Shigella/Enteroinvasive E coli (EIEC) NOT DETECTED NOT DETECTED Final   Cryptosporidium NOT DETECTED NOT DETECTED Final   Cyclospora cayetanensis NOT DETECTED NOT DETECTED Final   Entamoeba histolytica NOT DETECTED NOT DETECTED Final   Giardia lamblia NOT DETECTED NOT DETECTED Final   Adenovirus F40/41 NOT DETECTED NOT DETECTED Final   Astrovirus NOT DETECTED NOT DETECTED Final   Norovirus GI/GII NOT DETECTED NOT DETECTED Final   Rotavirus A NOT DETECTED NOT DETECTED Final   Sapovirus (I, II, IV, and V) NOT DETECTED NOT DETECTED Final    Comment: Performed at [facility], [place] [place], [place] [address]  Culture, blood (Routine X 2) w Reflex to ID Panel     Status: None (Preliminary result)   Collection Time: 06/09/19  7:38 PM   Specimen: BLOOD LEFT HAND  Result Value Ref Range Status   Specimen Description   Final    BLOOD LEFT HAND Performed at [facility], [place] [address], [place], [place] [address]    Special Requests   Final    BOTTLES DRAWN AEROBIC ONLY Blood Culture adequate volume Performed at [facility] [address] [place], [place] [address]    Culture   Final    NO GROWTH 3 DAYS Performed at [facility], [place] [address] [place], [place] [address]    Report Status PENDING  Incomplete  Culture, blood (Routine X 2) w Reflex to ID Panel     Status: None (Preliminary result)   Collection Time: 06/09/19  7:38 PM   Specimen: BLOOD  Result Value Ref Range Status   Specimen Description   Final    BLOOD LEFT ANTECUBITAL Performed at [facility], [place] [address]  [place], [place] [address]    Special Requests   Final    BOTTLES DRAWN AEROBIC ONLY Blood Culture adequate volume Performed at [facility] [address], [place], [place] [address]    Culture   Final    NO GROWTH 3 DAYS Performed at [facility], [place] [address], [place], [place] [address]    Report Status PENDING  Incomplete  Culture, Urine     Status: Abnormal   Collection Time: 06/10/19  9:00 AM   Specimen: Urine, Random  Result Value Ref Range Status   Specimen Description   Final    URINE, RANDOM Performed at  [facility], [place] [address], [place], [place] [address]    Special Requests   Final    NONE Performed at [facility], [place] [address] [place], [place] [address]    Culture MULTIPLE SPECIES PRESENT, SUGGEST RECOLLECTION (A)  Final   Report Status 06/11/2019 FINAL  Final    Coagulation Studies: No results for input(s): LABPROT, INR in the last 72 hours.  Urinalysis: No results for input(s): COLORURINE, LABSPEC, PHURINE, GLUCOSEU, HGBUR, BILIRUBINUR, KETONESUR, PROTEINUR, UROBILINOGEN, NITRITE, LEUKOCYTESUR in the last 72 hours.  Invalid input(s): APPERANCEUR    Imaging: Ct Abdomen Pelvis Wo Contrast  Result Date: 06/11/2019 CLINICAL DATA:  Abdominal pain, diverticulitis suspected EXAM: CT ABDOMEN AND PELVIS WITHOUT CONTRAST TECHNIQUE: Multidetector CT imaging of the abdomen and pelvis was performed following the standard protocol without IV contrast. Oral enteric contrast was administered. COMPARISON:  07/30/2019 FINDINGS: Lower chest: Small to moderate left pleural effusion with associated atelectasis or consolidation, new compared to prior examination. Cardiomegaly and coronary artery calcifications. Hepatobiliary: No focal liver abnormality is seen. Status post cholecystectomy. No biliary dilatation. Pancreas: There may be minimal inflammatory stranding around the pancreatic tail (series 2, image 23).  Spleen: Normal in size without significant abnormality. Adrenals/Urinary Tract: Adrenal glands are unremarkable. Kidneys are normal, without renal calculi, solid lesion, or hydronephrosis. Bladder is unremarkable. Stomach/Bowel: Stomach is within normal limits. Appendix appears normal. No evidence of bowel wall thickening, distention, or inflammatory changes. Descending sigmoid diverticulosis. Vascular/Lymphatic: Aortic atherosclerosis. No enlarged abdominal or pelvic lymph nodes. Reproductive: No mass or other significant abnormality. Other: Redemonstrated umbilical hernia containing a single nonobstructed loop of transverse colon. Anasarca. Small volume ascites in the low abdomen. Musculoskeletal: No acute or significant osseous findings. IMPRESSION: 1. There may be mild inflammatory stranding about the pancreatic tail (series 2, image 23, series 5, image 53), suspicious for pancreatitis. 2. Descending sigmoid diverticulosis without evidence of acute diverticulitis. 3. Left pleural effusion, anasarca, and small volume ascites in the low abdomen. 4. Other chronic, incidental, and postoperative findings as detailed above. Electronically Signed   By: Eddie Candle M.D.   On: 06/11/2019 18:57     Medications:   . ceFEPime (MAXIPIME) IV Stopped (06/12/19 2038)  .  sodium bicarbonate (isotonic) infusion in sterile water 100 mL/hr at 06/13/19 1026   . Chlorhexidine Gluconate Cloth  6 each Topical Daily  . hydrocortisone sod succinate (SOLU-CORTEF) inj  50 mg Intravenous Q6H  . insulin aspart  0-9 Units Subcutaneous Q4H  . mouth rinse  15 mL Mouth Rinse BID  . metoprolol tartrate  12.5 mg Oral BID  . mometasone-formoterol  2 puff Inhalation BID  . pantoprazole  40 mg Oral BID  . thiamine  100 mg Oral Daily   acetaminophen, albuterol, iohexol, ondansetron (ZOFRAN) IV  Assessment/ Plan:   Acute on chronic kidney disease stage IV baseline about 3.13.8 with worsening BUN.  Appears to have an increased  weight but no evidence of gross congestive heart failure.  We will switch IV fluids to D5W with IV bicarbonate continue to monitor.  Chronic atrial fibrillation with intermittent rapid ventricular rate rate appears to be better controlled currently weaned off Cardizem, metoprolol discontinued due to hypotension.  It appears that she is back on her metoprolol 12.5 mg twice daily  Acute mild descending diverticulitis treated with a 7-day course of Cipro and Flagyl  Obesity with BMI greater than 30  Acute metabolic encephalopathy appears to have resolved  Metabolic acidosis will discontinue sodium bicarbonate infusion  COPD stable on inhalers  Hyperbilirubinemia and transaminitis appears to be improving  Diabetes mellitus appears to be stable  Hyperlipidemia statins have been held secondary transaminitis  Gout appears to be stable with no acute exacerbations uses allopurinol at home  History of GI bleed with angiodysplasia at home on Protonix hemoglobin is been stable  Stage II pressure ulcer.  Patient is a poor candidate for dialysis I discussed this with the family.  They appear to want to pursue all measures.  Discussed with the family patient will be going to [place] with possible hospice care.   LOS: [place] @TODAY @10 :57 AM

## 2019-06-13 NOTE — Progress Notes (Signed)
PROGRESS NOTE   Kathleen [name]  VVO:[id number]    DOB: 1946-03-21    DOA: 05/30/2019  PCP: Nolene Ebbs, MD   I have briefly reviewed patients previous medical records in [facility].  Brief Narrative:  73 year old female with PMH of HTN, chronic diastolic CHF, stage IV CKD, CAD, DM 2, PAF, GI bleed, presented on 05/30/2019 due to persistent nausea, vomiting, abdominal pain and diarrhea of 2 weeks duration and dysphagia to solids and liquids.  She was admitted for suspected diverticulitis, mild pancreatitis, acute on chronic kidney disease and acute metabolic encephalopathy.  Hospital course complicated by episodes of rapid A. fib, hypotension, poor oral intake, suspected HCAP and progressive renal insufficiency.  PCCM, Nephrology and PMT consulted.   Assessment & Plan:   Principal Problem:   Dysphagia Active Problems:   Essential hypertension   Diabetes mellitus type 2, controlled (HCC)   HLD (hyperlipidemia)   CKD (chronic kidney disease) stage 4, GFR 15-29 ml/min (HCC)   Chronic diastolic CHF (congestive heart failure) (HCC)   Atrial fibrillation, chronic   Abnormal LFTs   GI bleed   Idiopathic acute pancreatitis without infection or necrosis   Diverticulitis of large intestine without perforation or abscess with bleeding   Hypotension   Atrial fibrillation with RVR ([place])   Suspected HCAP Recently completed course of antibiotics for suspected diverticulitis. Placed back on cefepime 6/27 in the context of elevated procalcitonin, leukocytosis, hypotension, elevated lactate and CT abdomen and pelvis showing lower lobe atelectasis or consolidation.  Diverticulitis of large intestine without perforation Completed 7 days course of Cipro and Flagyl.  No GI symptoms reported at this time.  Suspected mild acute pancreatitis Noted on CT abdomen.  No GI symptoms reported.  Dysphagia Placed on dysphagia 3 diet although patient not eating much.  Hypotension In the context of  chronic A. fib with intermittent RVR, stage IV CKD, pneumonia and poor oral intake. Has received intermittent IV fluid boluses. CCM consulted, initiated on IV hydrocortisone.  Blood pressures remained soft with SBP's in the 80s-100s but asymptomatic.  Off of IV fluids at this time. TSH normal.  Random cortisol 16.  Acute on stage IV chronic kidney disease/metabolic acidosis/?  Uremia Baseline creatinine 3.1-3.8. Evaluated by nephrology.  Initially suspected prerenal but also could be hypotension contributing to worsening renal insufficiency. Lasix held, received bicarbonate drip. Patient not long-term HD candidate and patient declining CRRT.  Chronic A. fib with intermittent RVR Not on AC due to prior history of GI bleed. Weaned off of Cardizem drip and continued on low-dose metoprolol 12.5 mg twice daily.  Rate controlled.  Hypernatremia Resolved.  Acute metabolic encephalopathy May be multifactorial in the setting of uremia, opiates and hypotension.  CT head and MRI brain unremarkable. Suspect improved.  Type II DM with renal insufficiency A1c 6.3.  Good inpatient control on SSI.  Hyperlipidemia Statins on hold due to abnormal LFTs.  Acute on chronic diastolic CHF Currently compensated.  History of B12 deficiency Stable B12 levels.  Abnormal LFTs/anasarca Acute hepatitis panel 6/18 unremarkable.  Imaging studies show anasarca but no focal liver abnormalities on CT scan.  Stable labs.  GI bleed history with angiodysplasia Continue home Protonix.  Thrombocytopenia Gradually drifting down platelet count.  Pepcid stopped.  Obesity/Body mass index is 39.01 kg/m.  Debility/deconditioning PT and OT recommended SNF but family plan to take home with possible hospice  Adult failure to thrive Multifactorial due to severe significant acute medical illnesses complicating underlying multiple comorbidities listed above and advanced  age and frailty. Detailed PMT input  appreciated.  They met with patient and family.  Patient has repeatedly expressed her wish to not continue any aggressive measures including dialysis, return home to [place] farm for end-of-life care.  Family are pursuing transporting her back to GI and they are aware that due to patient's critical illness, she may even demise on route.  They however insist on she remaining a full code at this time.  DVT prophylaxis: SCDs Code Status: Full Family Communication: Discussed in detail with patient's daughter and 2 granddaughters at bedside.  Updated care and answered all their questions. Disposition: Likely discharge home 7/2   Consultants:   Gastroenterology  Cardiology  Nephrology  Palliative care  PCCM  Procedures:   Echocardiogram done 6/27 with preserved ejection fraction and indeterminant diastolic dysfunction. Moderate mitral regurg  Antimicrobials:  Completed 7 days course of Cipro and Flagyl IV cefepime 6/27 >   Subjective: Patient interviewed and examined along with RN and her 2 granddaughters at bedside.  States that she feels okay and denies complaints.  No acute issues reported by family or RN.  Not eating much and food from last night still in tray.  ROS: As above, otherwise negative.  Objective:  Vitals:   06/13/19 0900 06/13/19 0940 06/13/19 1001 06/13/19 1200  BP: (!) 108/58 (!) 85/66 98/60   Pulse: 89 99 (!) 103   Resp: 20  14   Temp:   98 F (36.7 C) 97.6 F (36.4 C)  TempSrc:   Axillary Axillary  SpO2: 100%  100%   Weight:      Height:        Examination:  General exam: Pleasant elderly female, small built and nourished lying comfortably propped up in bed without distress. Respiratory system: Diminished breath sounds at bases but otherwise clear to auscultation without wheezing or crackles. Respiratory effort normal. Cardiovascular system: S1 & S2 heard, RRR. No JVD, murmurs, rubs, gallops or clicks. No pedal edema.  Telemetry personally reviewed: A.  fib with controlled ventricular rate in the 90s to 100s Gastrointestinal system: Abdomen is nondistended, soft and nontender. No organomegaly or masses felt. Normal bowel sounds heard. Central nervous system: Alert and oriented x2. No focal neurological deficits. Extremities: Symmetric 5 x 5 power. Skin: No rashes, lesions or ulcers Psychiatry: Judgement and insight appear impaired. Mood & affect flat.     Data Reviewed: I have personally reviewed following labs and imaging studies  CBC: Recent Labs  Lab 06/09/19 0417 06/10/19 0435 06/11/19 0434 06/12/19 0219 06/13/19 0220  WBC 16.9* 15.7* 15.2* 13.2* 14.5*  HGB 10.1* 9.5* 10.3* 9.7* 9.9*  HCT 32.7* 30.4* 32.7* 31.6* 30.6*  MCV 80.0 80.2 80.5 82.5 79.9*  PLT 95* 92* 97* 86* 78*   Basic Metabolic Panel: Recent Labs  Lab 06/09/19 0417 06/10/19 0435 06/11/19 0434 06/12/19 0219 06/13/19 0220  NA 142 141 142 140 141  K 5.1 4.8 4.9 5.2* 4.5  CL 111 111 110 109 105  CO2 18* 16* 15* 16* 22  GLUCOSE 101* 109* 117* 107* 121*  BUN 84* 114* 116* 117* 119*  CREATININE 3.89* 4.04* 4.36* 4.13* 4.14*  CALCIUM 9.8 9.2 9.4 8.8* 8.7*   Liver Function Tests: Recent Labs  Lab 06/08/19 0400 06/09/19 0417 06/11/19 0434 06/12/19 0219 06/13/19 0220  AST 32 33 40 39 34  ALT 90* 92* 93* 83* 77*  ALKPHOS 68 69 73 70 73  BILITOT 3.9* 3.8* 4.1* 4.1* 3.6*  PROT 5.3* 5.6* 6.0* 5.9* 6.0*  ALBUMIN 2.5* 2.7* 2.7* 2.8* 2.7*   Coagulation Profile: No results for input(s): INR, PROTIME in the last 168 hours. Cardiac Enzymes: No results for input(s): CKTOTAL, CKMB, CKMBINDEX, TROPONINI in the last 168 hours. HbA1C: No results for input(s): HGBA1C in the last 72 hours. CBG: Recent Labs  Lab 06/12/19 2016 06/12/19 2345 06/13/19 0348 06/13/19 0742 06/13/19 1154  GLUCAP 129* 114* 124* 116* 128*    Recent Results (from the past 240 hour(s))  Culture, blood (Routine X 2) w Reflex to ID Panel     Status: None (Preliminary result)    Collection Time: 06/09/19  7:38 PM   Specimen: BLOOD LEFT HAND  Result Value Ref Range Status   Specimen Description   Final    BLOOD LEFT HAND Performed at [facility], [place] [address], [place], [place] [address]    Special Requests   Final    BOTTLES DRAWN AEROBIC ONLY Blood Culture adequate volume Performed at [place] [address] [place], [place] [address]    Culture   Final    NO GROWTH 3 DAYS Performed at [facility], [place] [address], [place], [place] [address]    Report Status PENDING  Incomplete  Culture, blood (Routine X 2) w Reflex to ID Panel     Status: None (Preliminary result)   Collection Time: 06/09/19  7:38 PM   Specimen: BLOOD  Result Value Ref Range Status   Specimen Description   Final    BLOOD LEFT ANTECUBITAL Performed at [facility], [place] [address] [place], [place] [address]    Special Requests   Final    BOTTLES DRAWN AEROBIC ONLY Blood Culture adequate volume Performed at [place] [address] [place], [place] [address]    Culture   Final    NO GROWTH 3 DAYS Performed at [facility], [place] [address], [place], [place] [address]    Report Status PENDING  Incomplete  Culture, Urine     Status: Abnormal   Collection Time: 06/10/19  9:00 AM   Specimen: Urine, Random  Result Value Ref Range Status   Specimen Description   Final    URINE, RANDOM Performed at [place] [address] [place], [place] [address]    Special Requests   Final    NONE Performed at [facility], [place] [address], [place],  [address]    Culture MULTIPLE SPECIES PRESENT, SUGGEST RECOLLECTION (A)  Final   Report Status 06/11/2019 FINAL  Final         Radiology Studies: Ct Abdomen Pelvis Wo Contrast  Result Date: 06/11/2019 CLINICAL DATA:  Abdominal pain, diverticulitis suspected EXAM: CT ABDOMEN AND PELVIS WITHOUT CONTRAST  TECHNIQUE: Multidetector CT imaging of the abdomen and pelvis was performed following the standard protocol without IV contrast. Oral enteric contrast was administered. COMPARISON:  07/30/2019 FINDINGS: Lower chest: Small to moderate left pleural effusion with associated atelectasis or consolidation, new compared to prior examination. Cardiomegaly and coronary artery calcifications. Hepatobiliary: No focal liver abnormality is seen. Status post cholecystectomy. No biliary dilatation. Pancreas: There may be minimal inflammatory stranding around the pancreatic tail (series 2, image 23). Spleen: Normal in size without significant abnormality. Adrenals/Urinary Tract: Adrenal glands are unremarkable. Kidneys are normal, without renal calculi, solid lesion, or hydronephrosis. Bladder is unremarkable. Stomach/Bowel: Stomach is within normal limits. Appendix appears normal. No evidence of bowel wall thickening, distention, or inflammatory changes. Descending sigmoid diverticulosis.  Vascular/Lymphatic: Aortic atherosclerosis. No enlarged abdominal or pelvic lymph nodes. Reproductive: No mass or other significant abnormality. Other: Redemonstrated umbilical hernia containing a single nonobstructed loop of transverse colon. Anasarca. Small volume ascites in the low abdomen. Musculoskeletal: No acute or significant osseous findings. IMPRESSION: 1. There may be mild inflammatory stranding about the pancreatic tail (series 2, image 23, series 5, image 53), suspicious for pancreatitis. 2. Descending sigmoid diverticulosis without evidence of acute diverticulitis. 3. Left pleural effusion, anasarca, and small volume ascites in the low abdomen. 4. Other chronic, incidental, and postoperative findings as detailed above. Electronically Signed   By: Eddie Candle M.D.   On: 06/11/2019 18:57        Scheduled Meds: . Chlorhexidine Gluconate Cloth  6 each Topical Daily  . hydrocortisone sod succinate (SOLU-CORTEF) inj  50 mg  Intravenous Q6H  . insulin aspart  0-9 Units Subcutaneous Q4H  . mouth rinse  15 mL Mouth Rinse BID  . metoprolol tartrate  12.5 mg Oral BID  . mometasone-formoterol  2 puff Inhalation BID  . pantoprazole  40 mg Oral BID  . thiamine  100 mg Oral Daily   Continuous Infusions: . ceFEPime (MAXIPIME) IV Stopped (06/12/19 2038)     LOS: 13 days     Vernell Leep, MD, FACP, [facility]. [facility]  To contact the attending provider between 7A-7P or the covering provider during after hours 7P-7A, please log into the web site [email or web address] and access using universal [place] password for that web site. If you do not have the password, please call the hospital operator.  06/13/2019, 1:46 PM

## 2019-06-13 NOTE — Progress Notes (Signed)
Nutrition Follow-up  DOCUMENTATION CODES:   Obesity unspecified  INTERVENTION:  - continue to encourage PO intakes as tolerated/desired. - will continue to monitor for nutrition-related needs. - will monitor for ongoing [place] decisions.    NUTRITION DIAGNOSIS:   Inadequate oral intake related to acute illness, decreased appetite as evidenced by meal completion < 50%. -ongoing  GOAL:   Patient will meet greater than or equal to 90% of their needs -unmet  MONITOR:   PO intake, Supplement acceptance, Labs, Weight trends, I & O's  ASSESSMENT:   73 year old female with past medical history of arthritis, CHF, CAD, DM, GERD, HTN, and sleep apnea. She presented to the ED with complaints of dysphagia, vomiting, and diarrhea. She reported diarrhea began 1-2 weeks ago and that she lost 10 lb during that time. No associated abdominal pain, although diarrhea sometimes wakes her up at night.  Weight continues to trend up since admission (6/17). Per RN flow sheet, patient consumed 80% of breakfast and lunch and 50% of dinner on 6/25; 0% of breakfast and lunch on 6/27; 10% of lunch on 6/28; 10% of breakfast and lunch on 6/30.  RN flow sheet indicates patient is a/o x4, but patient did not interact with RD in any way during visit. Patient's daughter is at bedside and provides all information. Patient consumed 50% of cream of wheat and beverages for breakfast this AM. Daughter confirms this is more than patient has consumed at a meal in the past few days. She states that patient is doing well with soft diet and that patient does attempt to self feed but does need some assistance at times. She asks if patient's diet should be limited with concern for kidney function. Informed her that this was not necessary at this time and to provide patient with any items she likes and is able to chew and swallow comfortably and safely. She denies any other nutrition-related questions, concerns, needs at this time.    Palliative Care is following and note from yesterday states that daughter reported that patient has had a decreased appetite for 2-3 months and increasing fatigue and weakness during that time frame. Patient remains full code. Per review of Palliative note, family wishes to pursue options for transporting patient to the family farm in [place], per patient's desire to pass away there.      Medications reviewed; sliding scale novolog, 50 mg solu-cortef QID, 40 mg oral protonix BID, 100 mg oral thiamine/day.  Labs reviewed; CBGs: 124 and 116 mg/dl today, BUN: 119 mg/dl, creatinine: 4.14 mg/dl, Ca: 8.7 mg/dl, ALT elevated, GFR: 12 ml/min.    NUTRITION - FOCUSED PHYSICAL EXAM:  completed to upper body only per daughter's request; no muscle or fat wasting noted.   Diet Order:   Diet Order            DIET DYS 3 Room service appropriate? Yes; Fluid consistency: Thin  Diet effective now              EDUCATION NEEDS:   Not appropriate for education at this time  Skin:  Skin Assessment: Reviewed RN Assessment  Last BM:  6/30  Height:   Ht Readings from Last 1 Encounters:  05/30/19 5\' 4"  (1.626 m)    Weight:   Wt Readings from Last 1 Encounters:  06/13/19 103.1 kg    Ideal Body Weight:  54.5 kg  BMI:  Body mass index is 39.01 kg/m.  Estimated Nutritional Needs:   Kcal:  1740-1910 kcal  Protein:  80-90 grams  Fluid:  >/= 2.5 L/day     Jarome Matin, MS, RD, LDN, [facility] Inpatient Clinical Dietitian Pager # [phone] After hours/weekend pager # [phone]

## 2019-06-13 NOTE — Progress Notes (Signed)
Daily Progress Note   Patient Name: Kathleen [name]       Date: 06/13/2019 DOB: 02/28/1946  Age: 73 y.o. MRN#: [id number] Attending Physician: Modena Jansky, MD Primary Care Physician: Nolene Ebbs, MD Admit Date: 05/30/2019  Reason for Consultation/Follow-up: Establishing goals of care  Subjective: Patient somewhat lethargic, easily aroused. She denies pain. Alert to self, place, and family. Asking if she is going to leave today. Daughter is at the bedside and explained to her the plan to leave tomorrow.   Reviewed with daughter at length [place] discussion from yesterday. She verbalized wishes for patient to remain a full code until transported home to [place]. I again discussed possibility of patient declining while hospitalized requiring CPR and intubation. Daughter states "this is why a family member is remaining with her 24/7 in case this happens we will be here and can make decisions" I also discussed again the risk of patient transporting back to [place] and possibility of death in route. Daughter confirms her and her siblings are aware and are willing to take the risk to carry out their mother's wishes.   Daughter shares that arrangements have been made and confirmed with [facility] transportation company for patient to be escorted by their company's vehicle to [place]. She states the driver and transport team are medical providers, but unsure of their roles (I.e. RN, EMT etc.). We discussed needs during transportation. Advised oxygen should be available as needed. Patient will not need any forms of IV fluids or medication administration given daughter confirms team is transporting patient to their farm in [place] and not another receiving medical center. Daughter reports she has  confirmed that oxygen is available on their transport truck and the expected arrival time to transport patient is between 0900-1100.   Daughter shares she has been in contact with hospice in their home town and they are prepared to admit patient under their services once she arrives. We discussed need for medical equipment (hospital bed and oxygen). Daughter reports she has notified them of her needs. Requesting referral or some form of documentation is listed in discharge summary/paperwork indicating hospice in home is needed.   Discussed with daughter medications for symptom management. Given patient is going to encounter a 7hr drive, explained Roxanol and Zofran can be used to assist with symptom management  such as shortness of breath, pain, or nausea. Will discuss with attending about a 7 day supply at minimum to allow family availability until hospice can take over her care. Daughter aware RX will be available at discharge.   Family verbalized understanding of plan and their excitement to attempt to get their mother home, hopefully to spend her last days to hours. Family appreciative of all medical staff's support and care during this difficult time.    Length of Stay: 13  Current Medications: Scheduled Meds:  . Chlorhexidine Gluconate Cloth  6 each Topical Daily  . hydrocortisone sod succinate (SOLU-CORTEF) inj  50 mg Intravenous Q6H  . insulin aspart  0-9 Units Subcutaneous Q4H  . mouth rinse  15 mL Mouth Rinse BID  . metoprolol tartrate  12.5 mg Oral BID  . mometasone-formoterol  2 puff Inhalation BID  . pantoprazole  40 mg Oral BID  . thiamine  100 mg Oral Daily    Continuous Infusions: . ceFEPime (MAXIPIME) IV Stopped (06/12/19 2038)    PRN Meds: acetaminophen, albuterol, iohexol, ondansetron (ZOFRAN) IV  Physical Exam        GENERAL: NAD, well-developed, chronically-ill appearing  RESPIRATORY: diminished  CARDIOVASCULAR: irregular, tachycardia  NEUROLOGICAL: lethargic,  easily aroused, alert to self, place and family, some confusion noted.   Vital Signs: BP (!) 100/58 (BP Location: Right Arm)   Pulse 97   Temp 97.6 F (36.4 C) (Axillary)   Resp 15   Ht 5\' 4"  (1.626 m)   Wt 103.1 kg   SpO2 99%   BMI 39.01 kg/m  SpO2: SpO2: 99 % O2 Device: O2 Device: Room Air O2 Flow Rate: O2 Flow Rate (L/min): 2 L/min(decreased to 1 lpm post tx as used at home per pt)  Intake/output summary:   Intake/Output Summary (Last 24 hours) at 06/13/2019 1614 Last data filed at 06/13/2019 0300 Gross per 24 hour  Intake 1101.03 ml  Output 200 ml  Net 901.03 ml   LBM: Last BM Date: 06/12/19 Baseline Weight: Weight: (192) Most recent weight: Weight: 103.1 kg       Palliative Assessment/Data: PPS 10%     Patient Active Problem List   Diagnosis Date Noted  . Hypotension 06/08/2019  . Atrial fibrillation with RVR ([place]) 06/08/2019  . Idiopathic acute pancreatitis without infection or necrosis   . Diverticulitis of large intestine without perforation or abscess with bleeding   . Dysphagia 05/30/2019  . AVM (arteriovenous malformation) of duodenum, acquired   . AVM (arteriovenous malformation) of stomach, acquired with hemorrhage   . AKI (acute kidney injury) ([place]) 02/02/2019  . Symptomatic anemia 12/19/2018  . GIB (gastrointestinal bleeding) 09/12/2018  . GI bleed 09/11/2018  . Malnutrition of moderate degree 06/26/2018  . Pressure injury of skin 06/26/2018  . Hypotension due to hypovolemia 06/22/2018  . Acute metabolic encephalopathy 67/20/9470  . Transaminitis 06/22/2018  . Anemia due to GI blood loss 06/22/2018  . Epigastric pain 06/22/2018  . Decreased oral intake 06/22/2018  . Hypovolemic shock ([place]) 06/22/2018  . Duodenitis 06/07/2018  . Chronic gastritis 06/07/2018  . Abnormal LFTs   . Upper GI bleed   . Melena 06/04/2018  . Atrial fibrillation, chronic 06/04/2018  . SOB (shortness of breath) 03/08/2018  . SIRS (systemic inflammatory response  syndrome) ([place]) 12/04/2017  . Hypothermia 12/04/2017  . Elevated LFTs 12/04/2017  . Sepsis ([place]) 12/04/2017  . Atrial flutter ([place]) 03/17/2015  . NSTEMI (non-ST elevated myocardial infarction) ([place])   . Chronic diastolic CHF (congestive heart failure) (  [place])   . Type 2 diabetes mellitus with hypoglycemia without coma ([place])   . Paroxysmal atrial fibrillation ([place]) 01/25/2015  . Non-ST elevation MI (NSTEMI) ([place]) 01/23/2015  . Diabetes mellitus type 2, controlled ([place]) 01/23/2015  . HLD (hyperlipidemia)   . CKD (chronic kidney disease) stage 4, GFR 15-29 ml/min ([facility])   . Acute congestive heart failure ([place]) 09/17/2014  . Asthma 03/26/2014  . GERD (gastroesophageal reflux disease) 03/26/2014  . Smoker 03/26/2014  . Postmenopausal bleeding 01/10/2014  . Carotid stenosis 01/04/2012  . Carpal tunnel syndrome on left 11/26/2011  . Transient ischemic attack on medication 11/02/2011  . Hyperlipidemia with target LDL less than 70 09/17/2008  . OBESITY 09/17/2008  . Essential hypertension 09/17/2008  . Coronary atherosclerosis 09/17/2008    Palliative Care Assessment & Plan   Patient Profile: Palliative Care consult requested for this 73 y.o. female with multiple medical problems including hypertension, diastolic congestive heart failure, CKD stage IV, coronary artery disease (stent 2003), diabetes mellitus, paroxysmal atrial fibrillation, GI bleed, and sleep apnea. She presented to ED with complaints of abdominal pain, nausea, vomiting, and diarrhea. Also reports some dysphagia. CT scan on 6/17 showed diverticulosis of descending colon and stranding pancreatic tail due to mild acute pancreatitis. She continues to have worsening renal failure, hypotension, and lethargy. Palliative Medicine team consulted for goals of care discussion.   Recommendations/Plan:  Full Code-as requested by family  Continue with current plan of care per attending  Family have arranged private transportation for  patient to return to their farm in [place]. States transportation is scheduled between 0900-1100 on 7/2.   Patient will need discharge summary with referral indicating home hospice at discharge to be given to hospice in [place].   Will need RX for Roxanol 5mg  po/SL every 2hrs PRN for pain/shortness of breath (7 day supply minimum) and Zofran 4mg  ODT every 4hrs PRN for nausea at discharge in addition to other recommended medications.   PMT will continue to support and follow as needed.   Goals of Care and Additional Recommendations:  Limitations on Scope of Treatment: Full Scope Treatment  Code Status:    Code Status Orders  (From admission, onward)         Start     Ordered   05/30/19 0820  Full code  Continuous     05/30/19 0819        Code Status History    Date Active Date Inactive Code Status Order ID Comments User Context   02/01/2019 2105 02/05/2019 1836 Full Code [id number]  Etta Quill, DO ED   12/19/2018 2056 12/22/2018 1922 Full Code [id number]  Etta Quill, DO ED   09/12/2018 0108 09/15/2018 1723 Full Code [id number]  Bethena Roys, MD Inpatient   06/22/2018 1837 06/29/2018 1702 Full Code [id number]  [name], MD ED   06/04/2018 1646 06/07/2018 2016 Full Code [id number]  [name], DO Inpatient   12/04/2017 0752 12/06/2017 1738 Full Code [id number]  [name], NP ED   01/23/2015 0608 01/28/2015 1636 Full Code [id number]  [name], MD ED   09/17/2014 1036 09/19/2014 1847 Full Code [id number]  [name], MD Inpatient   11/02/2011 1136 11/04/2011 1452 Full Code [id number]  [name], RN Inpatient   Advance Care Planning Activity       Prognosis:   Poor   Discharge Planning:  Discharging home with family via private family arranged vehicle for transport to [place] for continued EOL care and in home  hospice at family's request.   Care plan was discussed with patient, patient's family, bedside RN, and Dr. Algis Liming.   Thank you  for allowing the Palliative Medicine Team to assist in the care of this patient.  Time In: 1415 Time Out: 1515 Total Time: 60 min.   Greater than 50%  of this time was spent counseling and coordinating care related to the above assessment and plan.  Alda Lea, AGPCNP-BC Palliative Medicine Team  Phone: [phone] Pager: [phone] Amion: Bjorn Pippin    Please contact Palliative Medicine Team phone at [phone] for questions and concerns.

## 2019-06-14 DIAGNOSIS — J189 Pneumonia, unspecified organism: Secondary | ICD-10-CM

## 2019-06-14 LAB — COMPREHENSIVE METABOLIC PANEL
ALT: 82 U/L — ABNORMAL HIGH (ref 0–44)
AST: 44 U/L — ABNORMAL HIGH (ref 15–41)
Albumin: 2.9 g/dL — ABNORMAL LOW (ref 3.5–5.0)
Alkaline Phosphatase: 74 U/L (ref 38–126)
Anion gap: 19 — ABNORMAL HIGH (ref 5–15)
BUN: 116 mg/dL — ABNORMAL HIGH (ref 8–23)
CO2: 21 mmol/L — ABNORMAL LOW (ref 22–32)
Calcium: 8.9 mg/dL (ref 8.9–10.3)
Chloride: 102 mmol/L (ref 98–111)
Creatinine, Ser: 4.29 mg/dL — ABNORMAL HIGH (ref 0.44–1.00)
GFR calc Af Amer: 11 mL/min — ABNORMAL LOW (ref >60)
GFR calc non Af Amer: 10 mL/min — ABNORMAL LOW (ref >60)
Glucose, Bld: 140 mg/dL — ABNORMAL HIGH (ref 70–99)
Potassium: 4.5 mmol/L (ref 3.5–5.1)
Sodium: 142 mmol/L (ref 135–145)
Total Bilirubin: 4.1 mg/dL — ABNORMAL HIGH (ref 0.3–1.2)
Total Protein: 6.7 g/dL (ref 6.5–8.1)

## 2019-06-14 LAB — GLUCOSE, CAPILLARY
Glucose-Capillary: 130 mg/dL — ABNORMAL HIGH (ref 70–99)
Glucose-Capillary: 127 mg/dL — ABNORMAL HIGH (ref 70–99)
Glucose-Capillary: 119 mg/dL — ABNORMAL HIGH (ref 70–99)

## 2019-06-14 MED ORDER — CEFDINIR 300 MG PO CAPS: 300.0000 mg | ORAL_CAPSULE | Freq: Every day | ORAL | 0 refills | Status: AC

## 2019-06-14 MED ORDER — ONDANSETRON HCL 4 MG PO TABS: 4.0000 mg | ORAL_TABLET | Freq: Three times a day (TID) | ORAL | 0 refills | Status: DC | PRN

## 2019-06-14 MED ORDER — PANTOPRAZOLE SODIUM 40 MG PO TBEC: 40.0000 mg | DELAYED_RELEASE_TABLET | Freq: Every day | ORAL | 0 refills | Status: AC

## 2019-06-14 MED ORDER — MORPHINE SULFATE (CONCENTRATE) 10 MG /0.5 ML PO SOLN: 5.0000 mg | Freq: Four times a day (QID) | ORAL | 0 refills | Status: DC | PRN

## 2019-06-14 MED ORDER — HYDROCORTISONE 10 MG PO TABS: 10.0000 mg | ORAL_TABLET | Freq: Two times a day (BID) | ORAL | 0 refills | Status: DC

## 2019-06-14 MED ORDER — MORPHINE SULFATE (CONCENTRATE) 10 MG /0.5 ML PO SOLN: 5.0000 mg | Freq: Four times a day (QID) | ORAL | 0 refills | Status: AC | PRN

## 2019-06-14 MED ORDER — HYDROCORTISONE 10 MG PO TABS: 10.0000 mg | ORAL_TABLET | Freq: Two times a day (BID) | ORAL | 0 refills | Status: AC

## 2019-06-14 MED ORDER — ONDANSETRON HCL 4 MG PO TABS: 4.0000 mg | ORAL_TABLET | Freq: Three times a day (TID) | ORAL | 0 refills | Status: AC | PRN

## 2019-06-14 MED ORDER — CEFDINIR 300 MG PO CAPS: 300.0000 mg | ORAL_CAPSULE | Freq: Every day | ORAL | 0 refills | Status: DC

## 2019-06-14 MED ORDER — PANTOPRAZOLE SODIUM 40 MG PO TBEC: 40.0000 mg | DELAYED_RELEASE_TABLET | Freq: Every day | ORAL | 0 refills | Status: DC

## 2019-06-14 MED ORDER — METOPROLOL TARTRATE 25 MG PO TABS: 12.5000 mg | ORAL_TABLET | Freq: Two times a day (BID) | ORAL | 0 refills | Status: DC

## 2019-06-14 MED ORDER — METOPROLOL TARTRATE 25 MG PO TABS: 12.5000 mg | ORAL_TABLET | Freq: Two times a day (BID) | ORAL | 0 refills | Status: AC

## 2019-06-14 MED FILL — HYDROCORTISONE 20 MG TABLET: 20 | 30 days supply | Qty: 45 | Fill #0

## 2019-06-14 MED FILL — CEFDINIR 300 MG CAPSULE: 300 | 2 days supply | Qty: 2 | Fill #0

## 2019-06-14 MED FILL — MORPHINE SULF 100 MG/5 ML S: 100 | 30 days supply | Qty: 30 | Fill #0

## 2019-06-14 MED FILL — PANTOPRAZOLE SOD DR 40 MG T: 40 | 30 days supply | Qty: 30 | Fill #0

## 2019-06-14 MED FILL — METOPROLOL TARTRATE 25 MG T: 25 | 60 days supply | Qty: 60 | Fill #0

## 2019-06-14 MED FILL — ONDANSETRON HCL 4 MG TABLET: 4 | 6 days supply | Qty: 20 | Fill #0

## 2019-06-14 NOTE — Discharge Summary (Signed)
Physician Discharge Summary  Kathleen [name] CWC:[id number] DOB: [birth year]  PCP: Nolene Ebbs, MD  Admit date: 05/30/2019 Discharge date: 06/14/2019  Recommendations for Outpatient Follow-up:  1. PCP in patient's hometown in [place] upon getting there.  Follow-up and also can coordinate Home Hospice. 2. [place] when patient gets back to her home in [place]. 3. Dr. Nolene Ebbs, PCP in [place], [place], as and if needed.  Home Health: None Equipment/Devices: Oxygen via nasal cannula as needed for comfort.  Discharge Condition: Guarded and at high risk for decline and even death.  Patient and family have been extensively counseled regarding this and they are aware. CODE STATUS: Full code Diet recommendation: Heart healthy diet.  Dysphagia 3 consistency and thin liquids.  Discharge Diagnoses:  Principal Problem:   Dysphagia Active Problems:   Essential hypertension   Diabetes mellitus type 2, controlled (HCC)   HLD (hyperlipidemia)   CKD (chronic kidney disease) stage 4, GFR 15-29 ml/min (HCC)   Chronic diastolic CHF (congestive heart failure) (HCC)   Atrial fibrillation, chronic   Abnormal LFTs   GI bleed   Idiopathic acute pancreatitis without infection or necrosis   Diverticulitis of large intestine without perforation or abscess with bleeding   Hypotension   Atrial fibrillation with RVR (HCC)   Brief Summary: 73 year old female with PMH of HTN, chronic diastolic CHF, stage IV CKD, CAD, DM 2, PAF, GI bleed, presented on 05/30/2019 due to persistent nausea, vomiting, abdominal pain and diarrhea of 2 weeks duration and dysphagia to solids and liquids.  She was admitted for suspected diverticulitis, mild pancreatitis, acute on chronic kidney disease and acute metabolic encephalopathy.  Hospital course complicated by episodes of rapid A. fib, hypotension, poor oral intake, suspected HCAP and progressive renal insufficiency.  PCCM, Nephrology and PMT  consulted.   Assessment & Plan:  Suspected HCAP Recently completed course of antibiotics for suspected diverticulitis. Placed back on cefepime 6/27 in the context of elevated procalcitonin, leukocytosis, hypotension, elevated lactate and CT abdomen and pelvis showing lower lobe atelectasis or consolidation. Patient has completed 5 days course.  Discussed extensively with pharmacy and transitioned to oral Omnicef to complete total 7 days treatment.  As per pharmacy, since patient has tolerated cefepime, she should be okay with Omnicef (penicillin allergy noted).  Diverticulitis of large intestine without perforation Completed 7 days course of Cipro and Flagyl.  No GI symptoms reported at this time.  Suspected mild acute pancreatitis Noted on CT abdomen.  No GI symptoms reported.  Dysphagia Placed on dysphagia 3 diet although patient not eating much.  Hypotension In the context of chronic A. fib with intermittent RVR, stage IV CKD, pneumonia and poor oral intake. Has received intermittent IV fluid boluses. CCM consulted, initiated on IV hydrocortisone.    Ongoing intermittent soft blood pressures but patient does not seem symptomatic. TSH normal.  Random cortisol 16. She probably has relative adrenal insufficiency.  At discharge transitioned to oral hydrocortisone 20 mg in the morning and 10 mg in the evenings.  All her medications can be re-assessed when she gets home in [place] by her family physician to determine how many of the nonessential medications can be discontinued if she transitions to full comfort care/hospice  Acute on stage IV chronic kidney disease/metabolic acidosis/?  Uremia Baseline creatinine 3.1-3.8. Evaluated by Nephrology.  Initially suspected prerenal but also could be hypotension contributing to worsening renal insufficiency. Lasix held, received bicarbonate drip. Patient not long-term HD candidate and patient declining CRRT. No significant change in her  creatinine which remains greater than 4.  Chronic A. fib with intermittent RVR Not on AC due to prior history of GI bleed. Weaned off of Cardizem drip and continued on low-dose metoprolol 12.5 mg twice daily.    Rate mostly controlled but has period's of mild RVR.  Asymptomatic of same.  Hypernatremia Resolved.  Acute metabolic encephalopathy/Lethargy May be multifactorial in the setting of uremia, opiates and hypotension.  CT head and MRI brain unremarkable. As per sister at bedside, patient is appropriate mostly but at times not her usual but denies that she is confused.  Type II DM with renal insufficiency A1c 6.3.  Good inpatient control on SSI.  Has not required much of the sliding scale insulin.  At risk for hypoglycemia due to worsened renal insufficiency and poor oral intake.  Thereby discontinued all insulins at discharge.  If aggressive course is opted then may continue to check CBGs regularly and consider initiation of insulin as needed during outpatient follow-up.  Hyperlipidemia Statins on hold due to abnormal LFTs.  Acute on chronic diastolic CHF Currently compensated.  Lasix has been discontinued.  History of B12 deficiency B12: 1490.  Abnormal LFTs/anasarca Acute hepatitis panel 6/18 unremarkable.  Imaging studies show anasarca but no focal liver abnormalities on CT scan.  Stable labs.  GI bleed history with angiodysplasia Continue home Protonix.  Thrombocytopenia Gradually drifting down platelet count.  Pepcid stopped.  Again no follow-up labs recommended if patient is going to transition to hospice at home.  However if aggressive course is pursued upon reaching home then may consider following labs including CBC and BMP.  Obesity/Body mass index is 39.01 kg/m.  Debility/deconditioning PT and OT recommended SNF but family plan to take home with possible hospice  Umblical hernia with single nonobstructed loop of transverse colon seen in the CT  scan.  Adult failure to thrive Multifactorial due to severe significant acute medical illnesses complicating underlying multiple comorbidities listed above and advanced age and frailty. Detailed PMT input appreciated.  They met with patient and family.  Patient has repeatedly expressed her wish to not continue any aggressive measures including dialysis, return [place] for end-of-life care.  Family are transporting her back to [place] and they are aware that due to patient's critical illness, she may even demise on route.  They however insist on she remaining a full code at this time.  I discussed with PMT this morning and she recommends PRN Roxanol for pain, dyspnea and anxiety and Zofran for vomiting and nausea because we are not sure how patient will tolerate long ride from [place] to [place].  She has not used much or any pain or antiemetics in the hospital.   Consultants:   Gastroenterology  Cardiology  Nephrology  Palliative care  PCCM  Procedures:   Echocardiogram done 6/27 with preserved ejection fraction and indeterminant diastolic dysfunction. Moderate mitral regurg  Discharge Instructions  Discharge Instructions    (HEART FAILURE PATIENTS) Call MD:  Anytime you have any of the following symptoms: 1) 3 pound weight gain in 24 hours or 5 pounds in 1 week 2) shortness of breath, with or without a dry hacking cough 3) swelling in the hands, feet or stomach 4) if you have to sleep on extra pillows at night in order to breathe.   Complete by: As directed    Call MD for:  difficulty breathing, headache or visual disturbances   Complete by: As directed    Call MD for:  extreme fatigue  Complete by: As directed    Call MD for:  persistant dizziness or light-headedness   Complete by: As directed    Call MD for:  persistant nausea and vomiting   Complete by: As directed    Call MD for:  severe uncontrolled pain   Complete by: As directed    Call MD for:  temperature  >100.4   Complete by: As directed    Diet - low sodium heart healthy   Complete by: As directed    Dysphagia 3 diet and thin liquids.   Diet Carb Modified   Complete by: As directed    Increase activity slowly   Complete by: As directed        Medication List    STOP taking these medications   cyclobenzaprine 10 MG tablet Commonly known as: FLEXERIL   famotidine 20 MG tablet Commonly known as: PEPCID   furosemide 80 MG tablet Commonly known as: LASIX   isosorbide-hydrALAZINE 20-37.5 MG tablet Commonly known as: BIDIL   Lantus SoloStar 100 UNIT/ML Solostar Pen Generic drug: Insulin Glargine   pravastatin 80 MG tablet Commonly known as: PRAVACHOL     TAKE these medications   albuterol (2.5 MG/3ML) 0.083% nebulizer solution Commonly known as: PROVENTIL Take 2.5 mg by nebulization every 6 (six) hours as needed for wheezing or shortness of breath.   albuterol 108 (90 Base) MCG/ACT inhaler Commonly known as: VENTOLIN HFA Inhale 1-2 puffs into the lungs every 4 (four) hours as needed for wheezing or shortness of breath.   allopurinol 100 MG tablet Commonly known as: ZYLOPRIM Take 100 mg by mouth daily.   cefdinir 300 MG capsule Commonly known as: OMNICEF Take 1 capsule (300 mg total) by mouth daily for 2 days.   colchicine 0.6 MG tablet Take 0.6 mg by mouth daily as needed (gout).   hydrocortisone 10 MG tablet Commonly known as: Cortef Take 1-2 tablets (10-20 mg total) by mouth 2 (two) times daily. Take 2 tabs (20 mg total) in the mornings and 1 tab (10 mg total) in the evenings.   metoprolol tartrate 25 MG tablet Commonly known as: LOPRESSOR Take 0.5 tablets (12.5 mg total) by mouth 2 (two) times daily. What changed:   medication strength  how much to take   morphine CONCENTRATE 10 mg / 0.5 ml concentrated solution Take 0.25 mLs (5 mg total) by mouth every 6 (six) hours as needed for moderate pain, severe pain, anxiety or shortness of breath.    ondansetron 4 MG tablet Commonly known as: Zofran Take 1 tablet (4 mg total) by mouth every 8 (eight) hours as needed for nausea or vomiting.   pantoprazole 40 MG tablet Commonly known as: Protonix Take 1 tablet (40 mg total) by mouth daily.   Symbicort 80-4.5 MCG/ACT inhaler Generic drug: budesonide-formoterol Inhale 2 puffs into the lungs 2 (two) times daily.      Follow-up Information    Nolene Ebbs, MD. Schedule an appointment as soon as possible for a visit.   Specialty: Internal Medicine Contact information: [place] [address] [phone]        Minus Breeding, MD .   Specialty: Cardiology Contact information: [address] [place]  [address] [phone]        Family Physician in [place]. Schedule an appointment as soon as possible for a visit.   Why: Follow up and also regarding Home Hospice services.         Allergies  Allergen Reactions  .  Penicillins Hives and Shortness Of Breath    Tolerates Ceftin, Ceftriaxone, and Cefepime Has patient had a PCN reaction causing immediate rash, facial/tongue/throat swelling, SOB or lightheadedness with hypotension: Y Has patient had a PCN reaction causing severe rash involving mucus membranes or skin necrosis: Y Has patient had a PCN reaction that required hospitalization: Y Has patient had a PCN reaction occurring within the last 10 years: N       Procedures/Studies: Ct Abdomen Pelvis Wo Contrast  Result Date: 06/11/2019 CLINICAL DATA:  Abdominal pain, diverticulitis suspected EXAM: CT ABDOMEN AND PELVIS WITHOUT CONTRAST TECHNIQUE: Multidetector CT imaging of the abdomen and pelvis was performed following the standard protocol without IV contrast. Oral enteric contrast was administered. COMPARISON:  07/30/2019 FINDINGS: Lower chest: Small to moderate left pleural effusion with associated atelectasis or consolidation, new compared to prior examination. Cardiomegaly and  coronary artery calcifications. Hepatobiliary: No focal liver abnormality is seen. Status post cholecystectomy. No biliary dilatation. Pancreas: There may be minimal inflammatory stranding around the pancreatic tail (series 2, image 23). Spleen: Normal in size without significant abnormality. Adrenals/Urinary Tract: Adrenal glands are unremarkable. Kidneys are normal, without renal calculi, solid lesion, or hydronephrosis. Bladder is unremarkable. Stomach/Bowel: Stomach is within normal limits. Appendix appears normal. No evidence of bowel wall thickening, distention, or inflammatory changes. Descending sigmoid diverticulosis. Vascular/Lymphatic: Aortic atherosclerosis. No enlarged abdominal or pelvic lymph nodes. Reproductive: No mass or other significant abnormality. Other: Redemonstrated umbilical hernia containing a single nonobstructed loop of transverse colon. Anasarca. Small volume ascites in the low abdomen. Musculoskeletal: No acute or significant osseous findings. IMPRESSION: 1. There may be mild inflammatory stranding about the pancreatic tail (series 2, image 23, series 5, image 53), suspicious for pancreatitis. 2. Descending sigmoid diverticulosis without evidence of acute diverticulitis. 3. Left pleural effusion, anasarca, and small volume ascites in the low abdomen. 4. Other chronic, incidental, and postoperative findings as detailed above. Electronically Signed   By: Eddie Candle M.D.   On: 06/11/2019 18:57   Ct Abdomen Pelvis Wo Contrast  Result Date: 05/30/2019 CLINICAL DATA:  Persistent nausea, vomiting, abdominal pain and diarrhea. Symptoms 2 weeks. History of GI bleed. Hemodynamically stable. EXAM: CT ABDOMEN AND PELVIS WITHOUT CONTRAST TECHNIQUE: Multidetector CT imaging of the abdomen and pelvis was performed following the standard protocol without IV contrast. COMPARISON:  09/11/2018, 08/15/2015 FINDINGS: Lower chest: Subtle patchy density in the lung bases which may be due to atelectasis  versus early infection. Atelectasis over the lingula. Stable cardiomegaly. Calcified plaque over the coronary arteries. Mild calcified plaque over the thoracic aorta. Hepatobiliary: Previous cholecystectomy. Stable linear calcification over the left lobe of the liver. Biliary tree is normal. Pancreas: Mild stranding of the fat adjacent the tail of pancreas. Spleen: Normal. Adrenals/Urinary Tract: Stable prominence of the adrenal glands. Kidneys at the lower limits of normal in size with a few stable right renal cysts. No hydronephrosis. Ureters and bladder are unremarkable. Stomach/Bowel: Stomach and small bowel are normal. Appendix is normal. Diverticulosis of the distal descending and sigmoid colon. Very subtle stranding of the fat adjacent the distal descending colon in the left lower quadrant which may be within normal although could be due to mild acute diverticulitis. Short segment of transverse colon extends into a moderate size midline umbilical hernia. Vascular/Lymphatic: Moderate calcified plaque over the abdominal aorta. No adenopathy. Reproductive: Normal. Other: Moderate size midline umbilical hernia containing peritoneal fat and short segment of transverse colon. No evidence of bowel obstruction. Small amount of free fluid in the pelvis. Partially  visualized catheter on the most inferior image over the anterior pelvis/perineum. Musculoskeletal: Degenerative change of the spine and hips. Stable grade 1 anterolisthesis of L4 on L5. IMPRESSION: 1. Stranding of the peripancreatic fat adjacent the tail the pancreas which could be seen due to mild acute pancreatitis. Recommend clinical correlation. 2. Diverticulosis of the descending colon with subtle adjacent stranding of the pericolonic fat as findings may be within normal although could be seen due to mild acute diverticulitis. 3. Moderate size umbilical hernia containing peritoneal fat and short segment of transverse colon. No evidence of bowel  obstruction. 4. Subtle patchy opacification over the lung bases which may be due to atelectasis or early infection. Lingular atelectasis. 5.  Cardiomegaly.  Atherosclerotic coronary artery disease. 6.  Aortic Atherosclerosis (ICD10-I70.0). 7.  Slightly small kidneys.  Several stable right renal cysts. Electronically Signed   By: Marin Olp M.D.   On: 05/30/2019 11:07   Dg Chest 1 View  Result Date: 06/01/2019 CLINICAL DATA:  Hypertension EXAM: CHEST  1 VIEW COMPARISON:  05/30/2019, 12/19/2018 FINDINGS: Cardiomegaly. Enlarged central pulmonary arteries consistent with pulmonary hypertension. Aortic atherosclerosis. Patchy airspace disease at the left base. No pneumothorax. IMPRESSION: 1. Cardiomegaly.  Patchy atelectasis or infiltrate at the left base. 2. Enlarged appearing central pulmonary vessels suggesting arterial hypertension Electronically Signed   By: Donavan Foil M.D.   On: 06/01/2019 20:31   Dg Abd 1 View  Result Date: 06/08/2019 CLINICAL DATA:  Leukocytosis.  Hypotension.  Nausea and vomiting. EXAM: ABDOMEN - 1 VIEW COMPARISON:  10/09/2010.  CT 05/30/2019 FINDINGS: Single view of the abdomen was obtained. Mild to moderate distention of the stomach with gas. Scattered gas-filled loops of bowel throughout the abdomen and pelvis. Cholecystectomy clips in the right upper abdomen. Limited evaluation for free air on this single view. IMPRESSION: Gas-filled bowel loops throughout the abdomen and pelvis and there appears to be gas in the rectal area. Findings are not suggestive for an obstructive process but could be related to an ileus pattern. Electronically Signed   By: Markus Daft M.D.   On: 06/08/2019 10:22   Ct Head Wo Contrast  Result Date: 05/31/2019 CLINICAL DATA:  73 year old female with acute altered level of consciousness. EXAM: CT HEAD WITHOUT CONTRAST TECHNIQUE: Contiguous axial images were obtained from the base of the skull through the vertex without intravenous contrast. COMPARISON:   11/02/2011 CT and 11/03/2011 MR FINDINGS: Brain: No evidence of acute infarction, hemorrhage, hydrocephalus, extra-axial collection or mass lesion/mass effect. Vascular: Carotid atherosclerotic calcifications noted Skull: Normal. Negative for fracture or focal lesion. Sinuses/Orbits: No acute finding. Other: None. IMPRESSION: No evidence of acute intracranial abnormality. Electronically Signed   By: Margarette Canada M.D.   On: 05/31/2019 13:58   Mr Brain Wo Contrast  Result Date: 05/31/2019 CLINICAL DATA:  73 y/o  F; altered mental status. EXAM: MRI HEAD WITHOUT CONTRAST TECHNIQUE: Multiplanar, multiecho pulse sequences of the brain and surrounding structures were obtained without intravenous contrast. COMPARISON:  05/31/2019 CT head FINDINGS: Brain: Motion degradation of several sequences. No acute infarction, hemorrhage, hydrocephalus, extra-axial collection or mass lesion. Vascular: Normal flow voids. Skull and upper cervical spine: Normal marrow signal. Sinuses/Orbits: Negative. Other: None. IMPRESSION: No acute intracranial abnormality. Unremarkable MRI of the brain for age. Electronically Signed   By: Kristine Garbe M.D.   On: 05/31/2019 22:08   Dg Chest Port 1 View  Result Date: 06/08/2019 CLINICAL DATA:  GI bleed EXAM: PORTABLE CHEST - 1 VIEW COMPARISON:  06/01/2019 FINDINGS: Persistent left infrahilar  atelectasis/consolidation. Right lung clear. Heart size upper limits normal. Aortic Atherosclerosis (ICD10-170.0). No effusion. Vertebral endplate spurring at multiple levels in the lower thoracic spine. IMPRESSION: Persistent left infrahilar atelectasis/consolidation. Electronically Signed   By: Lucrezia Europe M.D.   On: 06/08/2019 10:31   Dg Chest Portable 1 View  Result Date: 05/30/2019 CLINICAL DATA:  Cough and shortness of breath. EXAM: PORTABLE CHEST 1 VIEW COMPARISON:  Two-view chest x-ray the heart is enlarged. Atherosclerotic changes 12/19/2018 are FINDINGS: Noted at the aortic arch.  Pulmonary arteries are enlarged. Mild edema is present. No significant effusions are evident. There is no significant airspace consolidation. IMPRESSION: 1. Cardiomegaly with increase in mild edema. Findings are consistent with congestive heart failure. 2. Aortic atherosclerosis. Electronically Signed   By: San Morelle M.D.   On: 05/30/2019 04:50      Subjective: Patient is a poor historian at this time and does not say much.  She states that she feels "okay".  Her sister at bedside provides history and indicates that patient has had no acute events or complaints.  No chest pain, dyspnea, nausea, vomiting reported.  Has been eating some but not a lot.  She states that her sister has been mostly coherent but at times is not but would not tolerate confusion.  Discharge Exam:  Vitals:   06/14/19 0000 06/14/19 0200 06/14/19 0400 06/14/19 0500  BP: 109/75 115/74    Pulse: (!) 149 (!) 117    Resp: (!) 30 17    Temp: 97.9 F (36.6 C)  97.7 F (36.5 C)   TempSrc: Axillary  Oral   SpO2: 99% 96%    Weight:    101.7 kg  Height:        General exam: Pleasant elderly female, small built and nourished lying comfortably propped up in bed without distress. Respiratory system: Diminished breath sounds at bases but otherwise clear to auscultation without wheezing or crackles. Respiratory effort normal. Cardiovascular system: S1 & S2 heard, RRR. No JVD, murmurs, rubs, gallops or clicks. No pedal edema.  Telemetry personally reviewed: A. fib with controlled ventricular rate in the 90s to 100s Gastrointestinal system: Abdomen is nondistended, soft and nontender. No organomegaly or masses felt. Normal bowel sounds heard. Central nervous system: Alert and oriented x2. No focal neurological deficits. Extremities: Symmetric 5 x 5 power. Skin: No rashes, lesions or ulcers Psychiatry: Judgement and insight appear impaired. Mood & affect flat.     The results of significant diagnostics from this  hospitalization (including imaging, microbiology, ancillary and laboratory) are listed below for reference.     Microbiology: Recent Results (from the past 240 hour(s))  Culture, blood (Routine X 2) w Reflex to ID Panel     Status: None (Preliminary result)   Collection Time: 06/09/19  7:38 PM   Specimen: BLOOD LEFT HAND  Result Value Ref Range Status   Specimen Description   Final    BLOOD LEFT HAND Performed at [facility] [address] [place], [place] [address]    Special Requests   Final    BOTTLES DRAWN AEROBIC ONLY Blood Culture adequate volume Performed at [facility] [address], [place], [place] [address]    Culture   Final    NO GROWTH 4 DAYS Performed at [facility], [place] [address], [place], [place] [address]    Report Status PENDING  Incomplete  Culture, blood (Routine X 2) w Reflex to ID Panel     Status: None (Preliminary  result)   Collection Time: 06/09/19  7:38 PM   Specimen: BLOOD  Result Value Ref Range Status   Specimen Description   Final    BLOOD LEFT ANTECUBITAL Performed at [facility], [address], [place], [place] [address]    Special Requests   Final    BOTTLES DRAWN AEROBIC ONLY Blood Culture adequate volume Performed at [facility] [address], [place], [place] [address]    Culture   Final    NO GROWTH 4 DAYS Performed at [facility], [place] [address], [place], [place] [address]    Report Status PENDING  Incomplete  Culture, Urine     Status: Abnormal   Collection Time: 06/10/19  9:00 AM   Specimen: Urine, Random  Result Value Ref Range Status   Specimen Description   Final    URINE, RANDOM Performed at [facility] [address], [place], [place] [address]    Special Requests   Final    NONE Performed at [facility], [place] [address], [place],  [address]    Culture MULTIPLE SPECIES PRESENT, SUGGEST  RECOLLECTION (A)  Final   Report Status 06/11/2019 FINAL  Final     Labs: CBC: Recent Labs  Lab 06/09/19 0417 06/10/19 0435 06/11/19 0434 06/12/19 0219 06/13/19 0220  WBC 16.9* 15.7* 15.2* 13.2* 14.5*  HGB 10.1* 9.5* 10.3* 9.7* 9.9*  HCT 32.7* 30.4* 32.7* 31.6* 30.6*  MCV 80.0 80.2 80.5 82.5 79.9*  PLT 95* 92* 97* 86* 78*   Basic Metabolic Panel: Recent Labs  Lab 06/10/19 0435 06/11/19 0434 06/12/19 0219 06/13/19 0220 06/14/19 0228  NA 141 142 140 141 142  K 4.8 4.9 5.2* 4.5 4.5  CL 111 110 109 105 102  CO2 16* 15* 16* 22 21*  GLUCOSE 109* 117* 107* 121* 140*  BUN 114* 116* 117* 119* 116*  CREATININE 4.04* 4.36* 4.13* 4.14* 4.29*  CALCIUM 9.2 9.4 8.8* 8.7* 8.9   Liver Function Tests: Recent Labs  Lab 06/09/19 0417 06/11/19 0434 06/12/19 0219 06/13/19 0220 06/14/19 0228  AST 33 40 39 34 44*  ALT 92* 93* 83* 77* 82*  ALKPHOS 69 73 70 73 74  BILITOT 3.8* 4.1* 4.1* 3.6* 4.1*  PROT 5.6* 6.0* 5.9* 6.0* 6.7  ALBUMIN 2.7* 2.7* 2.8* 2.7* 2.9*   BNP (last 3 results) Recent Labs    12/19/18 2020 05/30/19 0417 06/10/19 1428  BNP 863.6* 1,478.3* 408.8*   Cardiac Enzymes: No results for input(s): CKTOTAL, CKMB, CKMBINDEX, TROPONINI in the last 168 hours. CBG: Recent Labs  Lab 06/13/19 1538 06/13/19 1958 06/13/19 2320 06/14/19 0354 06/14/19 0733  GLUCAP 146* 136* 134* 130* 127*   Hgb A1c No results for input(s): HGBA1C in the last 72 hours. Lipid Profile No results for input(s): CHOL, HDL, LDLCALC, TRIG, CHOLHDL, LDLDIRECT in the last 72 hours. Thyroid function studies Recent Labs    06/12/19 0219  TSH 4.404   Anemia work up Recent Labs    06/12/19 0219  VITAMINB12 1,490*   Urinalysis    Component Value Date/Time   COLORURINE AMBER (A) 06/10/2019 0900   APPEARANCEUR CLEAR 06/10/2019 0900   LABSPEC 1.017 06/10/2019 0900   PHURINE 5.0 06/10/2019 0900   GLUCOSEU NEGATIVE 06/10/2019 0900   HGBUR NEGATIVE 06/10/2019 0900   BILIRUBINUR  NEGATIVE 06/10/2019 0900   KETONESUR NEGATIVE 06/10/2019 0900   PROTEINUR NEGATIVE 06/10/2019 0900   UROBILINOGEN 1.0 08/15/2015 0915   NITRITE NEGATIVE 06/10/2019 0900  LEUKOCYTESUR TRACE (A) 06/10/2019 0900    Discussed in detail with patient's sister at bedside, updated care and answered all questions reviewed discussed extensively with patient's RN and updated care.  Time coordinating discharge: 60 minutes  SIGNED:  Vernell Leep, MD, FACP, [facility]. [facility]  To contact the attending provider between 7A-7P or the covering provider during after hours 7P-7A, please log into the web site [email or web address] and access using universal [place] password for that web site. If you do not have the password, please call the hospital operator.

## 2019-06-14 NOTE — Discharge Instructions (Signed)

## 2019-06-14 NOTE — TOC Transition Note (Addendum)
Transition of Care [facility]) - CM/SW Discharge Note   Patient Details  Name: Kathleen [name] MRN: [id number] Date of Birth: 12/01/1946  [facility] CM/SW Contact:  Emunah Texidor, Marjie Skiff, RN Phone Number: 06/14/2019, 8:59 AM   Clinical Narrative:     Per PMT note from 06/13/2019 all arrangements have been made by family to transport pt home to [place] including medical transportation. Home hospice has been arranged for pt when she arrives to [place]. This CM faxed DC summary, PM note and face sheet to [facility] [facility] [phone] for Liaison Tally Joe [phone]. It was confirmed that faxed paperwork was received.  Marney Doctor RN,BSN [phone]

## 2019-06-15 LAB — CULTURE, BLOOD (ROUTINE X 2)
Culture: NO GROWTH
Culture: NO GROWTH
Special Requests: ADEQUATE
Special Requests: ADEQUATE

## 2019-07-14 DEATH — deceased

## 2019-08-23 IMAGING — DX DG CHEST 2V
2 series · 2 of 2 positions shown · non-contrast
Comparison: Chest radiograph 01/25/2015

CLINICAL DATA: Productive cough

EXAM:
CHEST  2 VIEW

[chest lat]
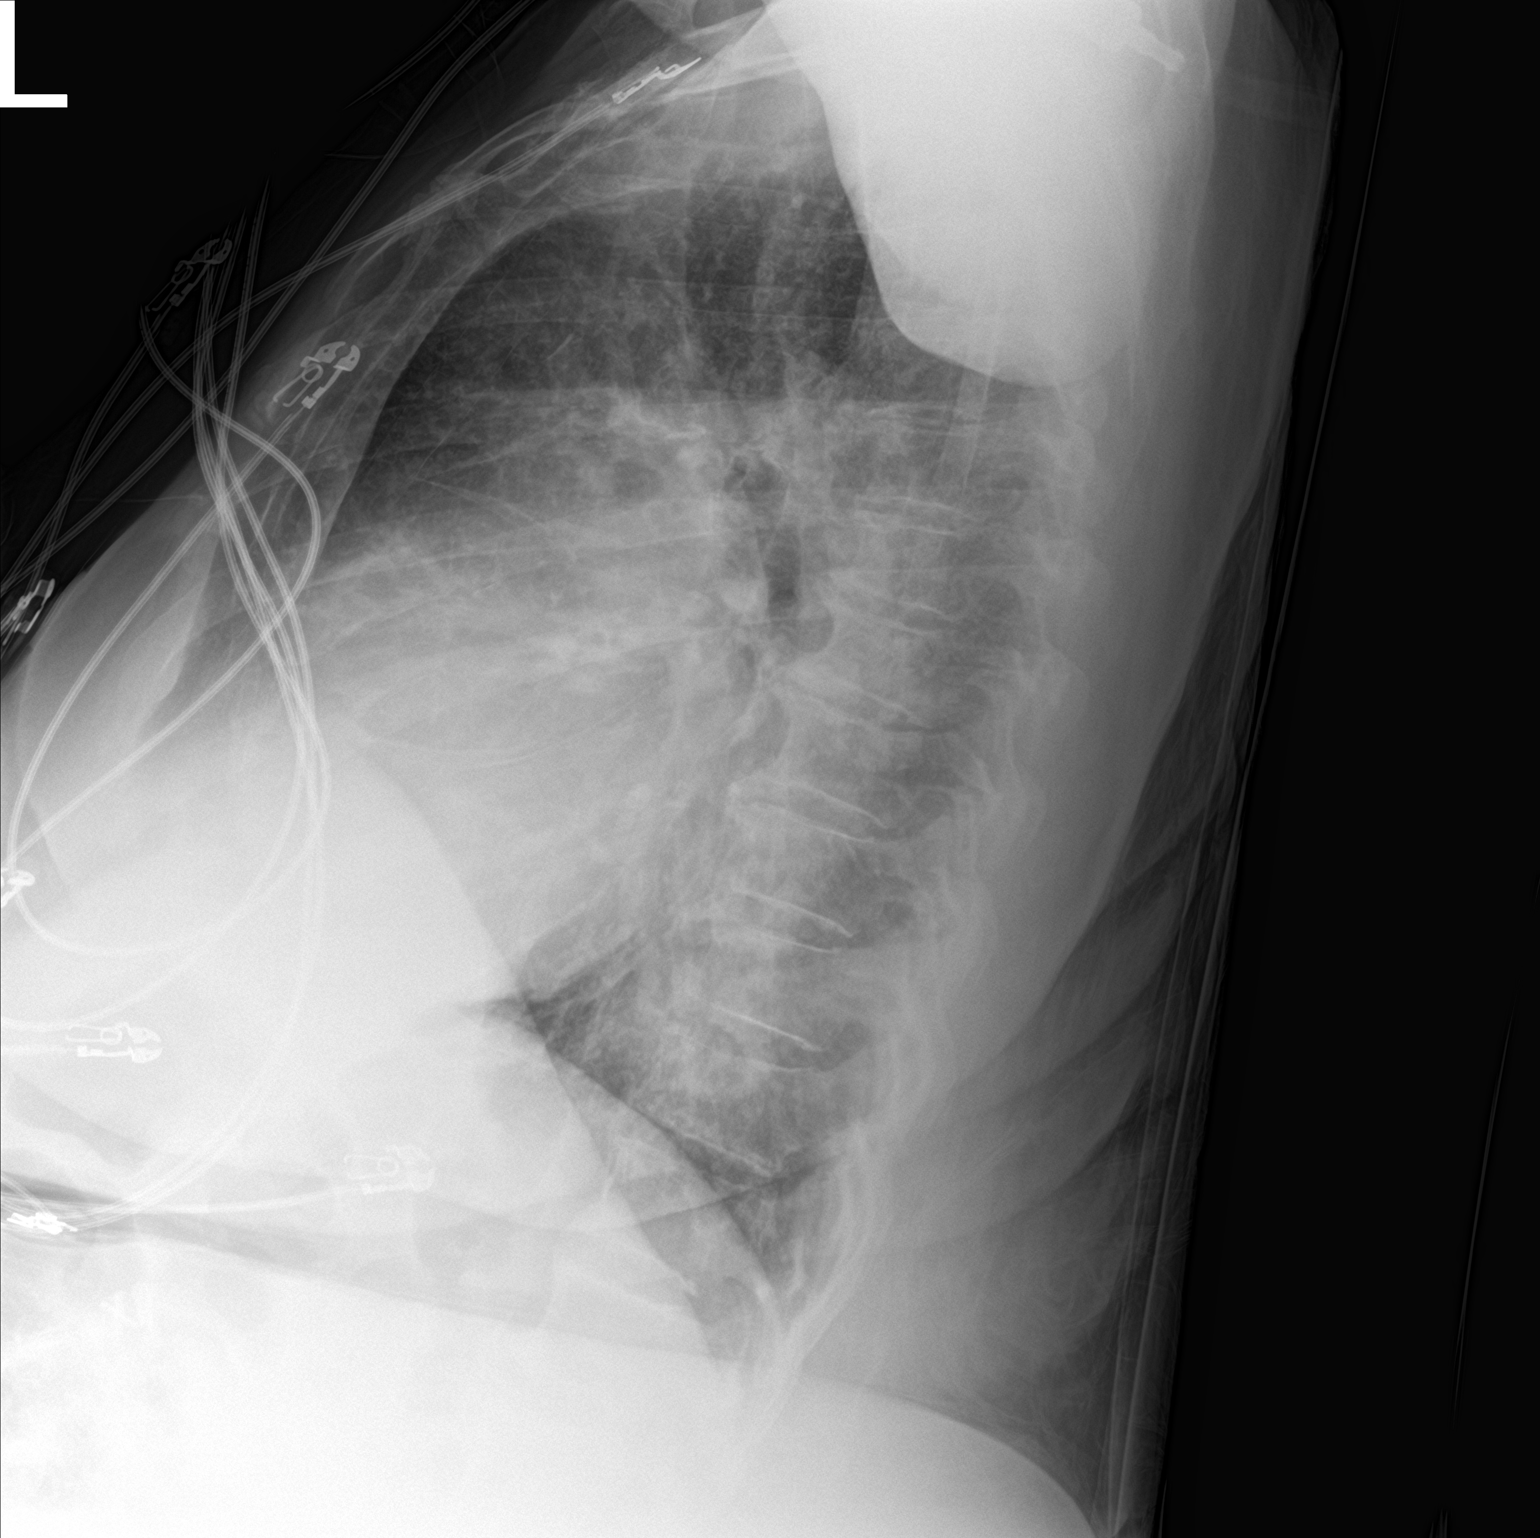

[chest ap]
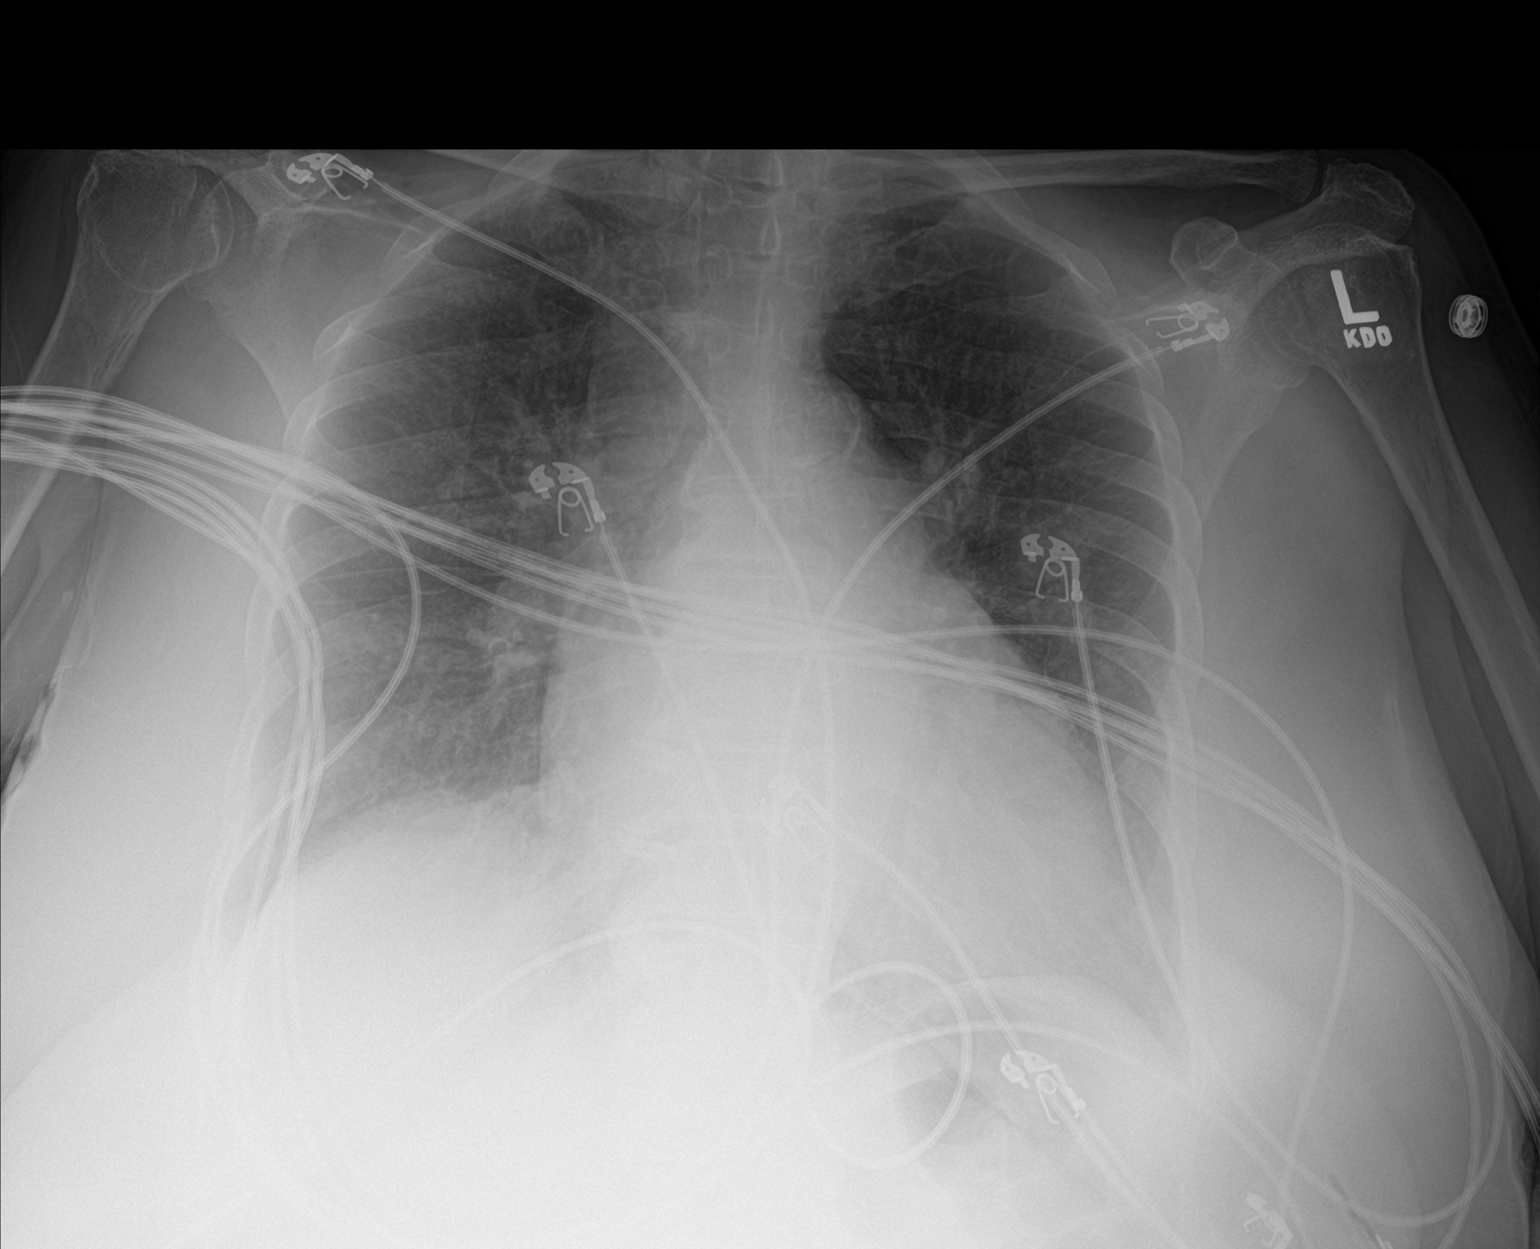

[2 of 2 positions shown; findings below may reference images not displayed]

FINDINGS: There is mild cardiomegaly, unchanged. Pulmonary vascular congestion
without overt edema. No focal consolidation. No pleural effusion or
pneumothorax.
IMPRESSION: Cardiomegaly and pulmonary vascular congestion without overt edema.

## 2019-08-24 IMAGING — CR DG CHEST 1V PORT
1 series · 1 of 1 positions shown · non-contrast
Comparison: 12/04/2017

CLINICAL DATA: Cough

EXAM:
PORTABLE CHEST 1 VIEW

[AP]
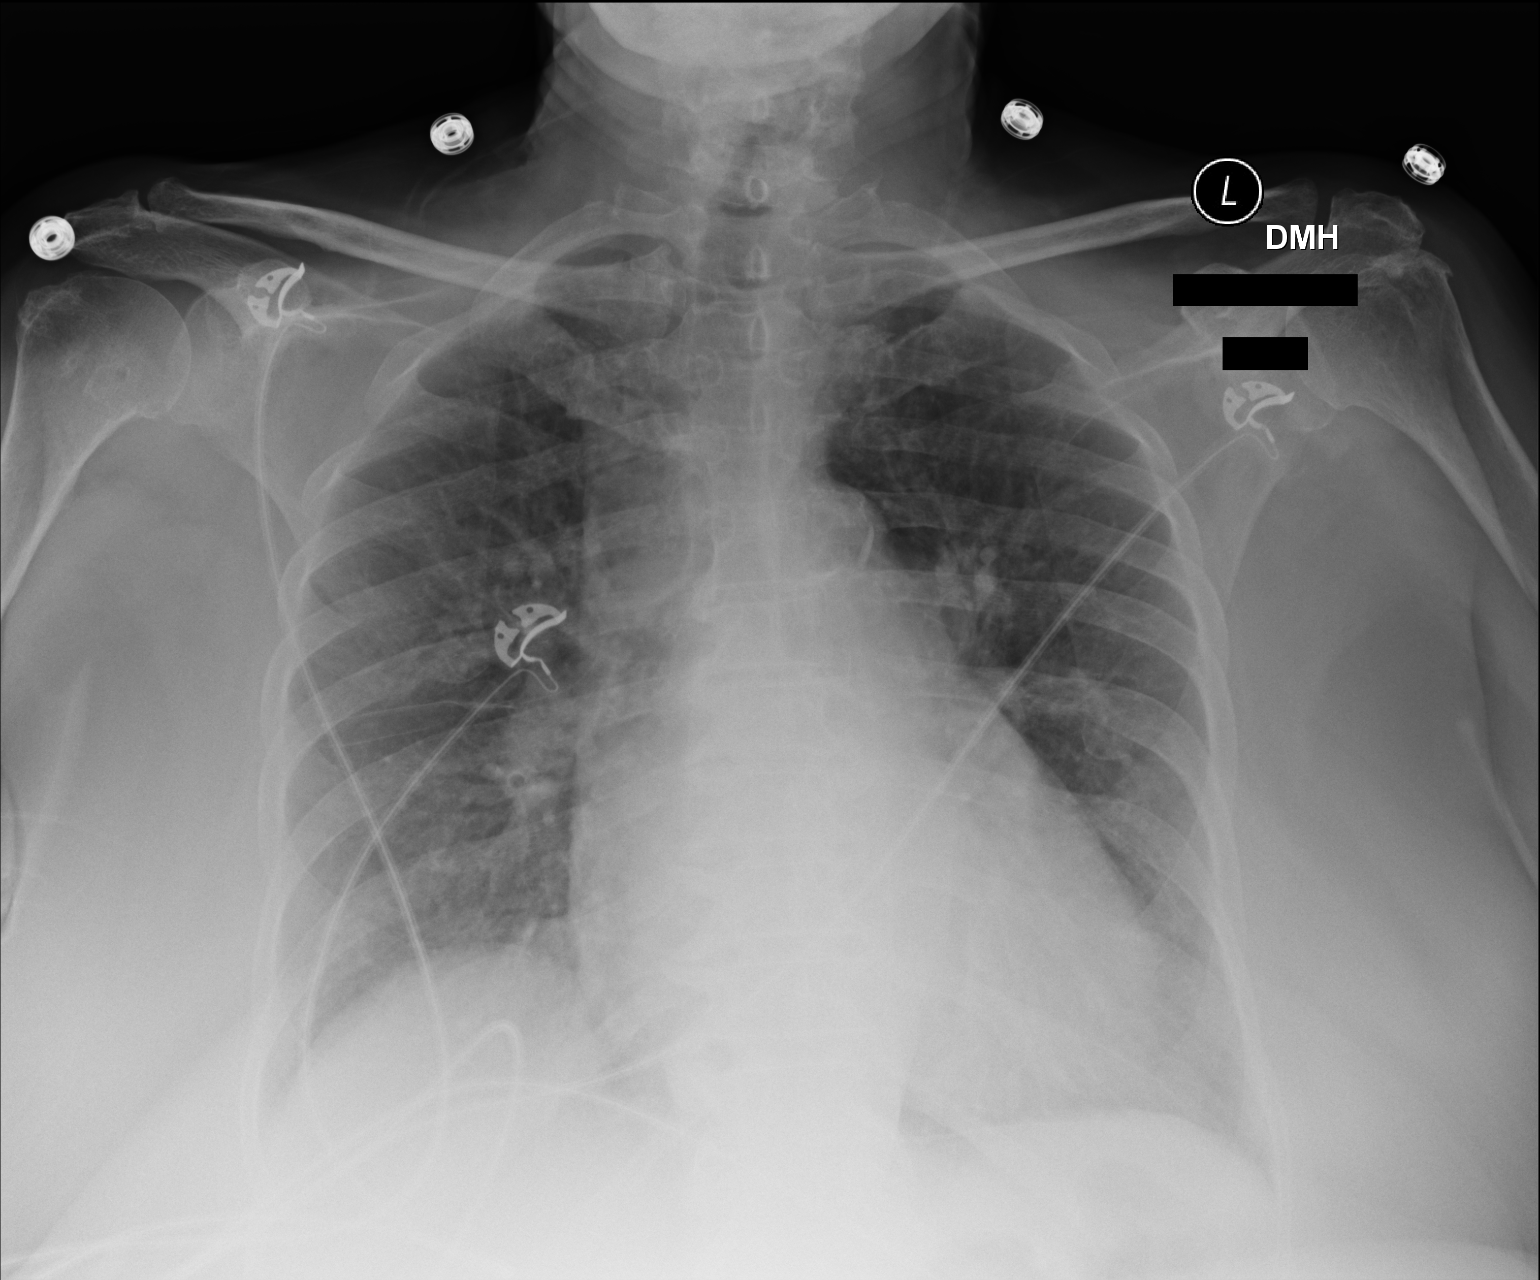

[1 of 1 positions shown; findings below may reference images not displayed]

FINDINGS: Cardiomegaly with pulmonary vascular congestion. Increased
interstitial markings in the right upper and lower lobes may reflect
mild interstitial edema or possibly multifocal infection. No pleural
effusion or pneumothorax.
IMPRESSION: Increased interstitial markings in the right lung may reflect mild
interstitial edema or multifocal infection.
